# Patient Record
Sex: Female | Born: 1960 | Race: Black or African American | Hispanic: No | Marital: Single | Smoking: Never smoker
Health system: Southern US, Community
[De-identification: ages and names within clinical notes are randomized; demographics above are authoritative.]

## PROBLEM LIST (undated history)

## (undated) DIAGNOSIS — M1990 Unspecified osteoarthritis, unspecified site: Secondary | ICD-10-CM

## (undated) DIAGNOSIS — M62838 Other muscle spasm: Secondary | ICD-10-CM

## (undated) DIAGNOSIS — F329 Major depressive disorder, single episode, unspecified: Secondary | ICD-10-CM

## (undated) DIAGNOSIS — I10 Essential (primary) hypertension: Secondary | ICD-10-CM

## (undated) DIAGNOSIS — G4730 Sleep apnea, unspecified: Secondary | ICD-10-CM

## (undated) DIAGNOSIS — F32A Depression, unspecified: Secondary | ICD-10-CM

## (undated) DIAGNOSIS — R000 Tachycardia, unspecified: Secondary | ICD-10-CM

## (undated) HISTORY — PX: NO PAST SURGERIES: SHX2092

## (undated) HISTORY — DX: Tachycardia, unspecified: R00.0

## (undated) HISTORY — PX: TUBAL LIGATION: SHX77

---

## 1999-04-16 ENCOUNTER — Other Ambulatory Visit: Admission: RE | Admit: 1999-04-16 | Discharge: 1999-04-16 | Payer: Self-pay | Admitting: Internal Medicine

## 2001-09-08 ENCOUNTER — Emergency Department (HOSPITAL_COMMUNITY): Admission: EM | Admit: 2001-09-08 | Discharge: 2001-09-09 | Payer: Self-pay | Admitting: Emergency Medicine

## 2001-09-09 ENCOUNTER — Encounter: Payer: Self-pay | Admitting: Emergency Medicine

## 2002-04-25 ENCOUNTER — Other Ambulatory Visit: Admission: RE | Admit: 2002-04-25 | Discharge: 2002-04-25 | Payer: Self-pay | Admitting: Family Medicine

## 2002-09-22 ENCOUNTER — Encounter: Payer: Self-pay | Admitting: Family Medicine

## 2002-09-22 ENCOUNTER — Encounter: Admission: RE | Admit: 2002-09-22 | Discharge: 2002-09-22 | Payer: Self-pay | Admitting: Family Medicine

## 2003-02-26 ENCOUNTER — Emergency Department (HOSPITAL_COMMUNITY): Admission: EM | Admit: 2003-02-26 | Discharge: 2003-02-26 | Payer: Self-pay

## 2004-06-21 ENCOUNTER — Ambulatory Visit: Payer: Self-pay | Admitting: Family Medicine

## 2004-06-24 ENCOUNTER — Ambulatory Visit (HOSPITAL_COMMUNITY): Admission: RE | Admit: 2004-06-24 | Discharge: 2004-06-24 | Payer: Self-pay | Admitting: Family Medicine

## 2004-09-20 ENCOUNTER — Other Ambulatory Visit: Admission: RE | Admit: 2004-09-20 | Discharge: 2004-09-20 | Payer: Self-pay | Admitting: Family Medicine

## 2004-09-20 ENCOUNTER — Ambulatory Visit: Payer: Self-pay | Admitting: Family Medicine

## 2005-08-29 ENCOUNTER — Emergency Department (HOSPITAL_COMMUNITY): Admission: EM | Admit: 2005-08-29 | Discharge: 2005-08-29 | Payer: Self-pay | Admitting: Emergency Medicine

## 2006-02-27 ENCOUNTER — Ambulatory Visit: Payer: Self-pay | Admitting: Family Medicine

## 2006-03-17 ENCOUNTER — Emergency Department (HOSPITAL_COMMUNITY): Admission: EM | Admit: 2006-03-17 | Discharge: 2006-03-18 | Payer: Self-pay | Admitting: Emergency Medicine

## 2006-08-13 ENCOUNTER — Emergency Department (HOSPITAL_COMMUNITY): Admission: EM | Admit: 2006-08-13 | Discharge: 2006-08-13 | Payer: Self-pay | Admitting: Emergency Medicine

## 2007-04-03 ENCOUNTER — Emergency Department (HOSPITAL_COMMUNITY): Admission: EM | Admit: 2007-04-03 | Discharge: 2007-04-03 | Payer: Self-pay | Admitting: Emergency Medicine

## 2007-06-22 ENCOUNTER — Ambulatory Visit: Payer: Self-pay | Admitting: Internal Medicine

## 2007-06-22 ENCOUNTER — Encounter (INDEPENDENT_AMBULATORY_CARE_PROVIDER_SITE_OTHER): Payer: Self-pay | Admitting: Family Medicine

## 2007-06-22 LAB — CONVERTED CEMR LAB
AST: 14 units/L (ref 0–37)
BUN: 12 mg/dL (ref 6–23)
CO2: 22 meq/L (ref 19–32)
Calcium: 9.6 mg/dL (ref 8.4–10.5)
Chlamydia, DNA Probe: NEGATIVE
Cholesterol: 186 mg/dL (ref 0–200)
Creatinine, Ser: 0.69 mg/dL (ref 0.40–1.20)
GC Probe Amp, Genital: NEGATIVE
HCT: 36.9 % (ref 36.0–46.0)
Hemoglobin: 11.9 g/dL — ABNORMAL LOW (ref 12.0–15.0)
Lymphocytes Relative: 34 % (ref 12–46)
MCHC: 32.2 g/dL (ref 30.0–36.0)
Monocytes Absolute: 0.5 10*3/uL (ref 0.1–1.0)
Monocytes Relative: 6 % (ref 3–12)
Neutro Abs: 4.1 10*3/uL (ref 1.7–7.7)
Neutrophils Relative %: 58 % (ref 43–77)
Platelets: 348 10*3/uL (ref 150–400)
RBC: 4.17 M/uL (ref 3.87–5.11)
Sed Rate: 46 mm/hr — ABNORMAL HIGH (ref 0–22)
Sodium: 140 meq/L (ref 135–145)
Total Bilirubin: 0.5 mg/dL (ref 0.3–1.2)
Triglycerides: 79 mg/dL (ref <150)
VLDL: 16 mg/dL (ref 0–40)
WBC: 7.1 10*3/uL (ref 4.0–10.5)

## 2007-06-28 ENCOUNTER — Ambulatory Visit (HOSPITAL_COMMUNITY): Admission: RE | Admit: 2007-06-28 | Discharge: 2007-06-28 | Payer: Self-pay | Admitting: Family Medicine

## 2007-06-29 ENCOUNTER — Ambulatory Visit: Payer: Self-pay | Admitting: Internal Medicine

## 2008-11-07 ENCOUNTER — Ambulatory Visit: Payer: Self-pay | Admitting: Family Medicine

## 2008-11-07 LAB — CONVERTED CEMR LAB: Microalb, Ur: 0.5 mg/dL (ref 0.00–1.89)

## 2008-11-14 ENCOUNTER — Ambulatory Visit (HOSPITAL_COMMUNITY): Admission: RE | Admit: 2008-11-14 | Discharge: 2008-11-14 | Payer: Self-pay | Admitting: Family Medicine

## 2009-01-23 ENCOUNTER — Ambulatory Visit: Payer: Self-pay | Admitting: Internal Medicine

## 2009-01-23 ENCOUNTER — Encounter (INDEPENDENT_AMBULATORY_CARE_PROVIDER_SITE_OTHER): Payer: Self-pay | Admitting: Family Medicine

## 2009-01-23 LAB — CONVERTED CEMR LAB
ALT: 9 units/L (ref 0–35)
AST: 12 units/L (ref 0–37)
Albumin: 4.1 g/dL (ref 3.5–5.2)
Alkaline Phosphatase: 66 units/L (ref 39–117)
BUN: 10 mg/dL (ref 6–23)
CO2: 17 meq/L — ABNORMAL LOW (ref 19–32)
Chloride: 107 meq/L (ref 96–112)
Creatinine, Ser: 0.82 mg/dL (ref 0.40–1.20)
Glucose, Bld: 91 mg/dL (ref 70–99)
HCT: 37.5 % (ref 36.0–46.0)
Hemoglobin: 11.9 g/dL — ABNORMAL LOW (ref 12.0–15.0)
MCV: 88.9 fL (ref 78.0–100.0)
Platelets: 329 10*3/uL (ref 150–400)
Potassium: 3.9 meq/L (ref 3.5–5.3)
RBC: 4.22 M/uL (ref 3.87–5.11)
RDW: 14.6 % (ref 11.5–15.5)
TSH: 1.526 microintl units/mL (ref 0.350–4.500)
Total Bilirubin: 0.5 mg/dL (ref 0.3–1.2)
Total Protein: 7 g/dL (ref 6.0–8.3)
Vit D, 25-Hydroxy: 18 ng/mL — ABNORMAL LOW (ref 30–89)
WBC: 5 10*3/uL (ref 4.0–10.5)

## 2009-02-06 ENCOUNTER — Ambulatory Visit: Payer: Self-pay | Admitting: Internal Medicine

## 2009-02-26 ENCOUNTER — Telehealth (INDEPENDENT_AMBULATORY_CARE_PROVIDER_SITE_OTHER): Payer: Self-pay | Admitting: *Deleted

## 2009-02-27 ENCOUNTER — Ambulatory Visit: Payer: Self-pay | Admitting: Family Medicine

## 2009-02-27 ENCOUNTER — Emergency Department (HOSPITAL_COMMUNITY): Admission: EM | Admit: 2009-02-27 | Discharge: 2009-02-27 | Payer: Self-pay | Admitting: Emergency Medicine

## 2009-03-28 ENCOUNTER — Ambulatory Visit: Payer: Self-pay | Admitting: Family Medicine

## 2009-05-09 ENCOUNTER — Emergency Department (HOSPITAL_COMMUNITY): Admission: EM | Admit: 2009-05-09 | Discharge: 2009-05-09 | Payer: Self-pay | Admitting: Emergency Medicine

## 2009-05-31 ENCOUNTER — Ambulatory Visit: Payer: Self-pay | Admitting: Family Medicine

## 2009-06-28 ENCOUNTER — Emergency Department (HOSPITAL_COMMUNITY): Admission: EM | Admit: 2009-06-28 | Discharge: 2009-06-28 | Payer: Self-pay | Admitting: Emergency Medicine

## 2009-08-15 ENCOUNTER — Emergency Department (HOSPITAL_COMMUNITY): Admission: EM | Admit: 2009-08-15 | Discharge: 2009-08-15 | Payer: Self-pay | Admitting: Emergency Medicine

## 2009-08-21 ENCOUNTER — Ambulatory Visit: Payer: Self-pay | Admitting: Family Medicine

## 2009-09-12 ENCOUNTER — Encounter: Admission: RE | Admit: 2009-09-12 | Discharge: 2009-11-22 | Payer: Self-pay | Admitting: Family Medicine

## 2009-10-05 ENCOUNTER — Emergency Department (HOSPITAL_COMMUNITY): Admission: EM | Admit: 2009-10-05 | Discharge: 2009-10-05 | Payer: Self-pay | Admitting: Emergency Medicine

## 2009-10-10 ENCOUNTER — Ambulatory Visit: Payer: Self-pay | Admitting: Internal Medicine

## 2009-11-06 ENCOUNTER — Ambulatory Visit: Payer: Self-pay | Admitting: Family Medicine

## 2010-01-15 ENCOUNTER — Ambulatory Visit (HOSPITAL_COMMUNITY)
Admission: RE | Admit: 2010-01-15 | Discharge: 2010-01-15 | Payer: Self-pay | Source: Home / Self Care | Admitting: Family Medicine

## 2010-03-26 NOTE — Progress Notes (Signed)
Summary: triage/chest pain  Phone Note Call from Patient   Caller: Patient Reason for Call: Talk to Nurse Summary of Call: Patient states she has a cold with head and chest congestion with tightness in her chest and sharpe pain at the breast level...she says she has numbness in her hands. She feels she has had a fever because she wakes up in night sweats..She has a hx of HTN and denies sore throat or headache... Her mother died in her sleep at 37 and used to use nitroglycerin tabs...otherwise no known hx of heart problems. She has been taking OTC tyenlol and has a history of acid reflux and not taking any medications for that.Marland KitchenMarland KitchenI advised her to go to ED for evaluation since I cannot rule out a cardiac problem.. Initial call taken by: Conchita Paris,  February 26, 2009 4:22 PM

## 2010-05-03 ENCOUNTER — Other Ambulatory Visit (HOSPITAL_COMMUNITY): Payer: Self-pay | Admitting: Family Medicine

## 2010-05-03 ENCOUNTER — Ambulatory Visit (HOSPITAL_COMMUNITY)
Admission: RE | Admit: 2010-05-03 | Discharge: 2010-05-03 | Disposition: A | Discharge status: Home / Self Care | Payer: Medicaid Other | Source: Ambulatory Visit | Attending: Family Medicine | Admitting: Family Medicine

## 2010-05-03 DIAGNOSIS — R52 Pain, unspecified: Secondary | ICD-10-CM

## 2010-05-03 DIAGNOSIS — M545 Low back pain, unspecified: Secondary | ICD-10-CM | POA: Insufficient documentation

## 2010-05-06 ENCOUNTER — Emergency Department (HOSPITAL_COMMUNITY)
Admission: EM | Admit: 2010-05-06 | Discharge: 2010-05-07 | Disposition: A | Discharge status: Home / Self Care | Payer: Medicaid Other | Attending: Emergency Medicine | Admitting: Emergency Medicine

## 2010-05-06 DIAGNOSIS — X500XXA Overexertion from strenuous movement or load, initial encounter: Secondary | ICD-10-CM | POA: Insufficient documentation

## 2010-05-06 DIAGNOSIS — M1710 Unilateral primary osteoarthritis, unspecified knee: Secondary | ICD-10-CM | POA: Insufficient documentation

## 2010-05-06 DIAGNOSIS — S93409A Sprain of unspecified ligament of unspecified ankle, initial encounter: Secondary | ICD-10-CM | POA: Insufficient documentation

## 2010-05-06 DIAGNOSIS — IMO0002 Reserved for concepts with insufficient information to code with codable children: Secondary | ICD-10-CM | POA: Insufficient documentation

## 2010-05-06 DIAGNOSIS — E669 Obesity, unspecified: Secondary | ICD-10-CM | POA: Insufficient documentation

## 2010-05-06 DIAGNOSIS — M25579 Pain in unspecified ankle and joints of unspecified foot: Secondary | ICD-10-CM | POA: Insufficient documentation

## 2010-05-06 DIAGNOSIS — M25569 Pain in unspecified knee: Secondary | ICD-10-CM | POA: Insufficient documentation

## 2010-05-06 DIAGNOSIS — Y92009 Unspecified place in unspecified non-institutional (private) residence as the place of occurrence of the external cause: Secondary | ICD-10-CM | POA: Insufficient documentation

## 2010-05-06 DIAGNOSIS — I10 Essential (primary) hypertension: Secondary | ICD-10-CM | POA: Insufficient documentation

## 2010-05-07 ENCOUNTER — Emergency Department (HOSPITAL_COMMUNITY): Payer: Medicaid Other

## 2010-05-10 LAB — CBC
Hemoglobin: 12 g/dL (ref 12.0–15.0)
MCH: 30 pg (ref 26.0–34.0)
MCHC: 33.8 g/dL (ref 30.0–36.0)
RBC: 3.99 MIL/uL (ref 3.87–5.11)

## 2010-05-10 LAB — URINALYSIS, ROUTINE W REFLEX MICROSCOPIC
Glucose, UA: NEGATIVE mg/dL
Ketones, ur: NEGATIVE mg/dL
Nitrite: NEGATIVE
Protein, ur: NEGATIVE mg/dL
Specific Gravity, Urine: 1.008 (ref 1.005–1.030)
pH: 7 (ref 5.0–8.0)

## 2010-05-10 LAB — POCT CARDIAC MARKERS
CKMB, poc: <1 ng/mL — ABNORMAL LOW (ref 1.0–8.0)
Myoglobin, poc: 37.1 ng/mL (ref 12–200)

## 2010-05-10 LAB — COMPREHENSIVE METABOLIC PANEL
ALT: 17 U/L (ref 0–35)
AST: 17 U/L (ref 0–37)
CO2: 24 mEq/L (ref 19–32)
Chloride: 111 mEq/L (ref 96–112)
GFR calc non Af Amer: >60 mL/min (ref >60)
Potassium: 4.1 mEq/L (ref 3.5–5.1)
Sodium: 141 mEq/L (ref 135–145)
Total Protein: 7.4 g/dL (ref 6.0–8.3)

## 2010-05-10 LAB — DIFFERENTIAL
Basophils Absolute: 0 10*3/uL (ref 0.0–0.1)
Eosinophils Relative: 1 % (ref 0–5)
Monocytes Absolute: 0.2 10*3/uL (ref 0.1–1.0)
Monocytes Relative: 3 % (ref 3–12)
Neutrophils Relative %: 67 % (ref 43–77)

## 2010-05-10 LAB — D-DIMER, QUANTITATIVE: D-Dimer, Quant: 0.35 ug/mL-FEU (ref 0.00–0.48)

## 2010-05-12 LAB — BASIC METABOLIC PANEL WITH GFR
BUN: 9 mg/dL (ref 6–23)
CO2: 26 meq/L (ref 19–32)
Calcium: 9.4 mg/dL (ref 8.4–10.5)
Chloride: 108 meq/L (ref 96–112)
Creatinine, Ser: 0.81 mg/dL (ref 0.4–1.2)
GFR calc non Af Amer: >60 mL/min
Glucose, Bld: 97 mg/dL (ref 70–99)
Potassium: 4.5 meq/L (ref 3.5–5.1)
Sodium: 139 meq/L (ref 135–145)

## 2010-05-12 LAB — CK TOTAL AND CKMB (NOT AT ARMC)
CK, MB: 1.5 ng/mL (ref 0.3–4.0)
Relative Index: 1.3 (ref 0.0–2.5)
Total CK: 114 U/L (ref 7–177)

## 2010-05-12 LAB — CBC
Hemoglobin: 13.2 g/dL (ref 12.0–15.0)
RBC: 4.47 MIL/uL (ref 3.87–5.11)
RDW: 14.7 % (ref 11.5–15.5)

## 2010-05-12 LAB — DIFFERENTIAL
Basophils Absolute: 0 10*3/uL (ref 0.0–0.1)
Basophils Relative: 1 % (ref 0–1)
Eosinophils Relative: 1 % (ref 0–5)
Lymphocytes Relative: 31 % (ref 12–46)
Monocytes Absolute: 0.4 10*3/uL (ref 0.1–1.0)

## 2010-05-12 LAB — TROPONIN I

## 2010-06-18 ENCOUNTER — Other Ambulatory Visit: Payer: Self-pay | Admitting: Family Medicine

## 2010-11-14 ENCOUNTER — Emergency Department (HOSPITAL_COMMUNITY)
Admission: EM | Admit: 2010-11-14 | Discharge: 2010-11-15 | Disposition: A | Discharge status: Home / Self Care | Payer: Medicaid Other | Attending: Emergency Medicine | Admitting: Emergency Medicine

## 2010-11-14 ENCOUNTER — Emergency Department (HOSPITAL_COMMUNITY): Payer: Medicaid Other

## 2010-11-14 DIAGNOSIS — F329 Major depressive disorder, single episode, unspecified: Secondary | ICD-10-CM | POA: Insufficient documentation

## 2010-11-14 DIAGNOSIS — F3289 Other specified depressive episodes: Secondary | ICD-10-CM | POA: Insufficient documentation

## 2010-11-14 DIAGNOSIS — N390 Urinary tract infection, site not specified: Secondary | ICD-10-CM | POA: Insufficient documentation

## 2010-11-14 DIAGNOSIS — I10 Essential (primary) hypertension: Secondary | ICD-10-CM | POA: Insufficient documentation

## 2010-11-14 DIAGNOSIS — Z79899 Other long term (current) drug therapy: Secondary | ICD-10-CM | POA: Insufficient documentation

## 2010-11-14 DIAGNOSIS — R109 Unspecified abdominal pain: Secondary | ICD-10-CM | POA: Insufficient documentation

## 2010-11-14 LAB — POCT I-STAT, CHEM 8
Calcium, Ion: 1.2 mmol/L (ref 1.12–1.32)
Chloride: 103 mEq/L (ref 96–112)
Glucose, Bld: 113 mg/dL — ABNORMAL HIGH (ref 70–99)
HCT: 41 % (ref 36.0–46.0)
Hemoglobin: 13.9 g/dL (ref 12.0–15.0)
TCO2: 29 mmol/L (ref 0–100)

## 2010-11-15 LAB — URINALYSIS, ROUTINE W REFLEX MICROSCOPIC
Hgb urine dipstick: NEGATIVE
Ketones, ur: 15 mg/dL — AB
Nitrite: NEGATIVE
Protein, ur: NEGATIVE mg/dL
Urobilinogen, UA: 1 mg/dL (ref 0.0–1.0)

## 2010-11-15 LAB — URINE MICROSCOPIC-ADD ON

## 2010-11-15 LAB — POCT PREGNANCY, URINE: Preg Test, Ur: NEGATIVE

## 2010-12-11 LAB — URINALYSIS, ROUTINE W REFLEX MICROSCOPIC
Glucose, UA: NEGATIVE
Hgb urine dipstick: NEGATIVE
Protein, ur: NEGATIVE
Specific Gravity, Urine: 1.028

## 2010-12-11 LAB — URINE MICROSCOPIC-ADD ON

## 2011-01-20 ENCOUNTER — Other Ambulatory Visit (HOSPITAL_COMMUNITY): Payer: Self-pay | Admitting: Family Medicine

## 2011-01-20 DIAGNOSIS — Z1231 Encounter for screening mammogram for malignant neoplasm of breast: Secondary | ICD-10-CM

## 2011-01-23 ENCOUNTER — Emergency Department (HOSPITAL_COMMUNITY)
Admission: EM | Admit: 2011-01-23 | Discharge: 2011-01-23 | Disposition: A | Discharge status: Home / Self Care | Payer: Medicaid Other | Attending: Emergency Medicine | Admitting: Emergency Medicine

## 2011-01-23 ENCOUNTER — Encounter: Payer: Self-pay | Admitting: Emergency Medicine

## 2011-01-23 DIAGNOSIS — M79606 Pain in leg, unspecified: Secondary | ICD-10-CM

## 2011-01-23 DIAGNOSIS — M79609 Pain in unspecified limb: Secondary | ICD-10-CM | POA: Insufficient documentation

## 2011-01-23 DIAGNOSIS — I10 Essential (primary) hypertension: Secondary | ICD-10-CM | POA: Insufficient documentation

## 2011-01-23 HISTORY — DX: Essential (primary) hypertension: I10

## 2011-01-23 HISTORY — DX: Unspecified osteoarthritis, unspecified site: M19.90

## 2011-01-23 MED ORDER — OXYCODONE-ACETAMINOPHEN 5-325 MG PO TABS
2.0000 | ORAL_TABLET | Freq: Once | ORAL | Status: AC
  Administered 2011-01-23: 2 via ORAL
  Filled 2011-01-23: qty 2

## 2011-01-23 MED ORDER — OXYCODONE-ACETAMINOPHEN 5-325 MG PO TABS: 2.0000 | ORAL_TABLET | ORAL | Status: AC | PRN

## 2011-01-23 MED ORDER — ENOXAPARIN SODIUM 150 MG/ML ~~LOC~~ SOLN
190.0000 mg | Freq: Once | SUBCUTANEOUS | Status: AC
  Administered 2011-01-23: 195 mg via SUBCUTANEOUS
  Filled 2011-01-23: qty 2

## 2011-01-23 NOTE — ED Notes (Signed)
Pt denies recent extended road trips & air travel

## 2011-01-23 NOTE — ED Provider Notes (Signed)
Medical screening examination/treatment/procedure(s) were performed by non-physician practitioner and as supervising physician I was immediately available for consultation/collaboration.  Martha K Linker, MD 01/23/11 2347 

## 2011-01-23 NOTE — ED Provider Notes (Signed)
History     CSN: [id number] Arrival date & time: 01/23/2011  6:39 PM   First MD Initiated Contact with Patient 01/23/11 2135      Chief Complaint  Patient presents with  . Leg Pain    (Consider location/radiation/quality/duration/timing/severity/associated sxs/prior treatment) Patient is a 50 y.o. female presenting with leg pain. The history is provided by the patient.  Leg Pain  The incident occurred yesterday. There was no injury mechanism. The pain is present in the right thigh and right leg. The quality of the pain is described as aching. Associated symptoms comments: Swelling. No shortness of breath or chest pain..    Past Medical History  Diagnosis Date  . Hypertension   . Arthritis     History reviewed. No pertinent past surgical history.  No family history on file.  History  Substance Use Topics  . Smoking status: Never Smoker   . Smokeless tobacco: Not on file  . Alcohol Use: No    OB History    Grav Para Term Preterm Abortions TAB SAB Ect Mult Living                  Review of Systems  Constitutional: Negative for fever and chills.  HENT: Negative.   Respiratory: Negative.   Cardiovascular: Negative.   Gastrointestinal: Negative.   Musculoskeletal:       See HPI.  Skin: Negative.   Neurological: Negative.     Allergies  Review of patient's allergies indicates no known allergies.  Home Medications   Current Outpatient Rx  Name Route Sig Dispense Refill  . SEROQUEL PO Oral Take by mouth.        BP 114/78  Pulse 85  Temp(Src) 97.5 F (36.4 C) (Oral)  Resp 20  Wt 425 lb (192.779 kg)  SpO2 98%  LMP 01/12/2011  Physical Exam  Constitutional: She appears well-developed and well-nourished.  HENT:  Head: Normocephalic.  Neck: Normal range of motion. Neck supple.  Cardiovascular: Normal rate and regular rhythm.   Pulmonary/Chest: Effort normal and breath sounds normal.  Abdominal: Soft. Bowel sounds are normal. There is no tenderness.  There is no rebound and no guarding.  Musculoskeletal: Normal range of motion.       Patient's exam limited due to body habitus. No swelling noted to right lower extremity, however, patient is morbidly obese. No erythema or warmth to touch. No lesions or sore. Tender to inner right thigh that extends to proximal calf. Distal pulses are 2+.  Neurological: She is alert. No cranial nerve deficit.  Skin: Skin is warm and dry. No rash noted.  Psychiatric: She has a normal mood and affect.    ED Course  Procedures (including critical care time)  Labs Reviewed - No data to display No results found.   No diagnosis found.    MDM  Patient given Lovenox and doppler studies ordered for the morning.        Rodena Medin, PA 01/23/11 2336

## 2011-01-23 NOTE — ED Notes (Signed)
PT. REPORTS RIGHT LEG PAIN WITH SWELLING FOR1 WEEK , DENIES INJURY OR FALL - SEEN [facility] ADVISED TO GO TO ER TO RULE OUT DVT.  NO SOB .

## 2011-01-24 ENCOUNTER — Ambulatory Visit (HOSPITAL_COMMUNITY)
Admission: RE | Admit: 2011-01-24 | Discharge: 2011-01-24 | Disposition: A | Discharge status: Home / Self Care | Payer: Medicaid Other | Source: Ambulatory Visit | Attending: Emergency Medicine | Admitting: Emergency Medicine

## 2011-01-24 DIAGNOSIS — M7989 Other specified soft tissue disorders: Secondary | ICD-10-CM

## 2011-01-24 DIAGNOSIS — R609 Edema, unspecified: Secondary | ICD-10-CM

## 2011-01-24 DIAGNOSIS — R52 Pain, unspecified: Secondary | ICD-10-CM

## 2011-01-24 DIAGNOSIS — M79609 Pain in unspecified limb: Secondary | ICD-10-CM | POA: Insufficient documentation

## 2011-01-24 NOTE — Progress Notes (Signed)
*  PRELIMINARY RESULTS*  Right Lower Extremity Venous has been performed.  No obvious evidence of deep vein thrombosis involving the right lower extremity. No evidence of Baker's cyst on the right.  Carla Sloan 01/24/2011, 12:32 PM

## 2011-02-24 ENCOUNTER — Ambulatory Visit (HOSPITAL_COMMUNITY)
Admission: RE | Admit: 2011-02-24 | Discharge: 2011-02-24 | Disposition: A | Discharge status: Home / Self Care | Payer: Medicaid Other | Source: Ambulatory Visit | Attending: Family Medicine | Admitting: Family Medicine

## 2011-02-24 DIAGNOSIS — Z1231 Encounter for screening mammogram for malignant neoplasm of breast: Secondary | ICD-10-CM | POA: Insufficient documentation

## 2011-05-29 ENCOUNTER — Ambulatory Visit: Payer: Medicaid Other | Attending: Family Medicine

## 2011-05-29 DIAGNOSIS — M2550 Pain in unspecified joint: Secondary | ICD-10-CM | POA: Insufficient documentation

## 2011-05-29 DIAGNOSIS — M545 Low back pain, unspecified: Secondary | ICD-10-CM | POA: Insufficient documentation

## 2011-05-29 DIAGNOSIS — IMO0001 Reserved for inherently not codable concepts without codable children: Secondary | ICD-10-CM | POA: Insufficient documentation

## 2011-05-29 DIAGNOSIS — R5381 Other malaise: Secondary | ICD-10-CM | POA: Insufficient documentation

## 2011-05-29 DIAGNOSIS — M2560 Stiffness of unspecified joint, not elsewhere classified: Secondary | ICD-10-CM | POA: Insufficient documentation

## 2011-06-02 ENCOUNTER — Encounter: Payer: Self-pay | Admitting: *Deleted

## 2011-06-03 ENCOUNTER — Ambulatory Visit: Payer: Medicaid Other

## 2011-06-10 ENCOUNTER — Ambulatory Visit: Payer: Medicaid Other

## 2011-06-17 ENCOUNTER — Ambulatory Visit: Payer: Medicaid Other

## 2011-07-03 ENCOUNTER — Emergency Department (HOSPITAL_COMMUNITY): Payer: Medicaid Other

## 2011-07-03 ENCOUNTER — Emergency Department (HOSPITAL_COMMUNITY)
Admission: EM | Admit: 2011-07-03 | Discharge: 2011-07-03 | Disposition: A | Discharge status: Home / Self Care | Payer: Medicaid Other | Attending: Emergency Medicine | Admitting: Emergency Medicine

## 2011-07-03 ENCOUNTER — Encounter (HOSPITAL_COMMUNITY): Payer: Self-pay

## 2011-07-03 DIAGNOSIS — R059 Cough, unspecified: Secondary | ICD-10-CM | POA: Insufficient documentation

## 2011-07-03 DIAGNOSIS — E669 Obesity, unspecified: Secondary | ICD-10-CM

## 2011-07-03 DIAGNOSIS — R0602 Shortness of breath: Secondary | ICD-10-CM | POA: Insufficient documentation

## 2011-07-03 DIAGNOSIS — M129 Arthropathy, unspecified: Secondary | ICD-10-CM | POA: Insufficient documentation

## 2011-07-03 DIAGNOSIS — Z79899 Other long term (current) drug therapy: Secondary | ICD-10-CM | POA: Insufficient documentation

## 2011-07-03 DIAGNOSIS — I10 Essential (primary) hypertension: Secondary | ICD-10-CM | POA: Insufficient documentation

## 2011-07-03 DIAGNOSIS — R609 Edema, unspecified: Secondary | ICD-10-CM | POA: Insufficient documentation

## 2011-07-03 DIAGNOSIS — R05 Cough: Secondary | ICD-10-CM | POA: Insufficient documentation

## 2011-07-03 DIAGNOSIS — M7989 Other specified soft tissue disorders: Secondary | ICD-10-CM | POA: Insufficient documentation

## 2011-07-03 LAB — PRO B NATRIURETIC PEPTIDE: Pro B Natriuretic peptide (BNP): 77.8 pg/mL (ref 0–125)

## 2011-07-03 LAB — CBC
HCT: 41.6 % (ref 36.0–46.0)
MCH: 29.4 pg (ref 26.0–34.0)
MCV: 87.2 fL (ref 78.0–100.0)
Platelets: 319 10*3/uL (ref 150–400)
RDW: 14.3 % (ref 11.5–15.5)
WBC: 6 10*3/uL (ref 4.0–10.5)

## 2011-07-03 LAB — DIFFERENTIAL
Basophils Absolute: 0 10*3/uL (ref 0.0–0.1)
Eosinophils Absolute: 0.1 10*3/uL (ref 0.0–0.7)
Eosinophils Relative: 2 % (ref 0–5)
Lymphocytes Relative: 40 % (ref 12–46)
Monocytes Absolute: 0.4 10*3/uL (ref 0.1–1.0)

## 2011-07-03 LAB — BASIC METABOLIC PANEL
CO2: 25 mEq/L (ref 19–32)
Calcium: 9.8 mg/dL (ref 8.4–10.5)
Creatinine, Ser: 0.89 mg/dL (ref 0.50–1.10)
GFR calc non Af Amer: 75 mL/min — ABNORMAL LOW (ref >90)
Glucose, Bld: 122 mg/dL — ABNORMAL HIGH (ref 70–99)

## 2011-07-03 NOTE — ED Notes (Signed)
Pt ambulated into ED independently without difficulty.

## 2011-07-03 NOTE — ED Provider Notes (Signed)
History     CSN: [id number]  Arrival date & time 07/03/11  1002   First MD Initiated Contact with Patient 07/03/11 1014      Chief Complaint  Patient presents with  . Shortness of Breath    (Consider location/radiation/quality/duration/timing/severity/associated sxs/prior treatment) HPI Comments: Patient reports that she has been feeling short of breath over the past 2 days, which is becoming progressively worse.  She reports that the shortness of breath becomes worse when she is lying flat.  She also reports PND over the past couple of days.  She reports that she has not noticed any lower extremity edema.  She reports that last evening she had a dry cough.  Denies wheezing.  Denies fever or chills.  Denies chest pain.  No history of COPD or asthma.  No known history of CHF.  No known history of cardiac disease.  She does have a history of HTN.  No history of hyperlipidemia or DM.  She does not smoke and never has smoked.  She denies any prolonged travel or surgery in the past 4 weeks, denies use of any estrogen containing medications, no history of DVT/PE.  Denies any erythema, pain, or swelling of lower extremities.  Her PCP is [facility].  Patient is a 51 y.o. female presenting with shortness of breath. The history is provided by the patient.  Shortness of Breath  Associated symptoms include cough and shortness of breath. Pertinent negatives include no chest pain, no fever, no rhinorrhea and no wheezing.    Past Medical History  Diagnosis Date  . Hypertension   . Arthritis     History reviewed. No pertinent past surgical history.  No family history on file.  History  Substance Use Topics  . Smoking status: Never Smoker   . Smokeless tobacco: Not on file  . Alcohol Use: No    OB History    Grav Para Term Preterm Abortions TAB SAB Ect Mult Living                  Review of Systems  Constitutional: Negative for fever, chills and diaphoresis.  HENT: Negative for  congestion and rhinorrhea.   Respiratory: Positive for cough and shortness of breath. Negative for chest tightness and wheezing.   Cardiovascular: Positive for leg swelling. Negative for chest pain.  Gastrointestinal: Negative for nausea and vomiting.  Neurological: Negative for dizziness, syncope and light-headedness.    Allergies  Review of patient's allergies indicates no known allergies.  Home Medications   Current Outpatient Rx  Name Route Sig Dispense Refill  . CITALOPRAM HYDROBROMIDE 40 MG PO TABS Oral Take 40 mg by mouth at bedtime.    . CYCLOBENZAPRINE HCL 5 MG PO TABS Oral Take 5 mg by mouth 3 (three) times daily.    Marland Kitchen GABAPENTIN 400 MG PO CAPS Oral Take 400-800 mg by mouth 2 (two) times daily. 1 cap in the morning and 2 cap at night    . HYDROCHLOROTHIAZIDE 25 MG PO TABS Oral Take 25 mg by mouth daily.    . QUETIAPINE FUMARATE 200 MG PO TABS Oral Take 200 mg by mouth at bedtime.    Marland Kitchen VERAPAMIL HCL ER 240 MG PO TBCR Oral Take 240 mg by mouth daily.      BP 122/76  Pulse 97  Temp 98.7 F (37.1 C)  Resp 16  SpO2 96%  LMP 06/09/2011  Physical Exam  Nursing note and vitals reviewed. Constitutional: She appears well-developed and well-nourished. No distress.  Morbidly obese  HENT:  Head: Normocephalic and atraumatic.  Neck: Normal range of motion. Neck supple.  Cardiovascular: Normal rate, regular rhythm, normal heart sounds and intact distal pulses.        1+ pitting pretibial edema bilaterally   Pulmonary/Chest: Effort normal and breath sounds normal. No accessory muscle usage. Not tachypneic. No respiratory distress. She has no decreased breath sounds. She has no wheezes. She has no rhonchi. She has no rales.       Patient increasingly short of breath while lying down  Neurological: She is alert.  Skin: Skin is warm and dry. She is not diaphoretic. No erythema.  Psychiatric: She has a normal mood and affect.    ED Course  Procedures (including critical care  time)   Labs Reviewed  CBC  DIFFERENTIAL  BASIC METABOLIC PANEL  PRO B NATRIURETIC PEPTIDE   No results found.   No diagnosis found.   Date: 07/03/2011  Rate: 90  Rhythm: normal sinus rhythm  QRS Axis: normal  Intervals: normal  ST/T Wave abnormalities: nonspecific T wave changes  Conduction Disutrbances:none  Narrative Interpretation:   Old EKG Reviewed: unchanged   Patient discussed with Dr. Anitra Lauth who also evaluated patient.   MDM  Patient comes in today with a chief complaint of SOB.  She denies any chest pain.  Lungs CTAB.  No respiratory distress.  Pulse ox 98 on RA.  No acute changes on CXR.  Negative troponin.  No acute changes on EKG.  Normal d-dimer.  Normal BNP.  Feel that shortness of breath is most likely caused by morbid obesity.  Patient instructed to follow up with PCP.        Pascal Lux [place], PA-C 07/03/11 1737

## 2011-07-03 NOTE — Discharge Instructions (Signed)
Wear compression hose on both legs and elevate your legs to help with the swelling.

## 2011-07-03 NOTE — ED Notes (Signed)
Pt. Having sob with a dry cough began yesterday, pt. Denies any chest pain, is unable to lay flat

## 2011-07-03 NOTE — ED Notes (Signed)
Pt states sob x 2 days cannot lay flat to sleep she states  Sob on exertion is on 2 meds for htn back pain pt is obese

## 2011-07-03 NOTE — ED Provider Notes (Signed)
Medical screening examination/treatment/procedure(s) were conducted as a shared visit with non-physician practitioner(s) and myself.  I personally evaluated the patient during the encounter Pt with SOB with lying down and trace fluid in the lower ext.  Pt is in no distress on exam and sating normally.  All labs for possible PE, CHF, cardiac issues are wnl.  Pt denies CP.  Feel the majority of her symptoms are from her weight problems.  Pt is over 400lbs and 5'5''.  This was discussed the pt that is most likely the origin of her problems and encouraged her to find weight loss options and to talk with her PMD.  Gwyneth Sprout, MD 07/03/11 2139

## 2012-06-07 ENCOUNTER — Other Ambulatory Visit (HOSPITAL_COMMUNITY): Payer: Self-pay | Admitting: Internal Medicine

## 2012-06-07 DIAGNOSIS — Z1231 Encounter for screening mammogram for malignant neoplasm of breast: Secondary | ICD-10-CM

## 2012-06-29 ENCOUNTER — Ambulatory Visit (HOSPITAL_COMMUNITY): Payer: Medicaid Other | Attending: Internal Medicine

## 2012-08-24 ENCOUNTER — Other Ambulatory Visit: Payer: Self-pay

## 2012-08-24 DIAGNOSIS — Z1231 Encounter for screening mammogram for malignant neoplasm of breast: Secondary | ICD-10-CM

## 2012-08-30 ENCOUNTER — Encounter (HOSPITAL_COMMUNITY): Payer: Self-pay | Admitting: *Deleted

## 2012-09-04 ENCOUNTER — Emergency Department (HOSPITAL_COMMUNITY)
Admission: EM | Admit: 2012-09-04 | Discharge: 2012-09-04 | Disposition: A | Discharge status: Home / Self Care | Payer: Medicare Other | Attending: Emergency Medicine | Admitting: Emergency Medicine

## 2012-09-04 ENCOUNTER — Encounter (HOSPITAL_COMMUNITY): Payer: Self-pay | Admitting: Emergency Medicine

## 2012-09-04 DIAGNOSIS — L259 Unspecified contact dermatitis, unspecified cause: Secondary | ICD-10-CM | POA: Insufficient documentation

## 2012-09-04 DIAGNOSIS — Z8739 Personal history of other diseases of the musculoskeletal system and connective tissue: Secondary | ICD-10-CM | POA: Insufficient documentation

## 2012-09-04 DIAGNOSIS — Z79899 Other long term (current) drug therapy: Secondary | ICD-10-CM | POA: Insufficient documentation

## 2012-09-04 DIAGNOSIS — I10 Essential (primary) hypertension: Secondary | ICD-10-CM | POA: Insufficient documentation

## 2012-09-04 MED ORDER — DIPHENHYDRAMINE HCL 25 MG PO TABS: 25.0000 mg | ORAL_TABLET | Freq: Four times a day (QID) | ORAL | Status: DC

## 2012-09-04 MED ORDER — FAMOTIDINE 20 MG PO TABS: 20.0000 mg | ORAL_TABLET | Freq: Two times a day (BID) | ORAL | Status: DC

## 2012-09-04 MED ORDER — PREDNISONE (PAK) 10 MG PO TABS: 10.0000 mg | ORAL_TABLET | Freq: Every day | ORAL | Status: DC

## 2012-09-04 NOTE — ED Notes (Signed)
Patient with rash on arms, chest, for two weeks.  No other associated symptoms.  Patient reports the rash itches.

## 2012-09-04 NOTE — ED Provider Notes (Signed)
History    CSN: [id number] Arrival date & time 09/04/12  1254  First MD Initiated Contact with Patient 09/04/12 1258     Chief Complaint  Patient presents with  . Rash   (Consider location/radiation/quality/duration/timing/severity/associated sxs/prior Treatment) HPI Comments: Patient reports itchy rash over arms, upper chest and upper back x 2 weeks.  She has changed detergents, has bought new clothes and worn them without washing them, and has a new mattress.  She has also stayed on her sister's couch recently (before the rash began).  Denies fevers, chills, difficulty swallowing or breathing, itching or swelling in her mouth or throat, any painful lesions.   The history is provided by the patient and the spouse.   Past Medical History  Diagnosis Date  . Hypertension   . Arthritis    Past Surgical History  Procedure Laterality Date  . No past surgeries     No family history on file. History  Substance Use Topics  . Smoking status: Never Smoker   . Smokeless tobacco: Never Used  . Alcohol Use: No   OB History   Grav Para Term Preterm Abortions TAB SAB Ect Mult Living                 Review of Systems  Constitutional: Negative for fever and chills.  Skin: Positive for rash.  Neurological: Negative for weakness and numbness.    Allergies  Review of patient's allergies indicates no known allergies.  Home Medications   Current Outpatient Rx  Name  Route  Sig  Dispense  Refill  . citalopram (CELEXA) 40 MG tablet   Oral   Take 40 mg by mouth at bedtime.         . gabapentin (NEURONTIN) 400 MG capsule   Oral   Take 400-800 mg by mouth 2 (two) times daily. 1 cap in the morning and 2 cap at night         . hydrochlorothiazide (HYDRODIURIL) 25 MG tablet   Oral   Take 25 mg by mouth daily.         . QUEtiapine (SEROQUEL) 200 MG tablet   Oral   Take 200 mg by mouth at bedtime.         . verapamil (CALAN-SR) 240 MG CR tablet   Oral   Take 240 mg by  mouth daily.          BP 150/86  Pulse 90  Temp(Src) 98.1 F (36.7 C) (Oral)  Resp 20  Wt 370 lb (167.831 kg)  SpO2 95%  LMP 07/31/2012 Physical Exam  Nursing note and vitals reviewed. Constitutional: She appears well-developed and well-nourished. No distress.  HENT:  Head: Normocephalic and atraumatic.  Mouth/Throat: Oropharynx is clear and moist. No oropharyngeal exudate.  Neck: Neck supple.  Cardiovascular: Normal rate and regular rhythm.   Pulmonary/Chest: Effort normal and breath sounds normal. No stridor. No respiratory distress. She has no wheezes. She has no rales.  Neurological: She is alert.  Skin: Rash noted. She is not diaphoretic.  Papular rash over upper extremities and small amount over chest and upper back.  Spares hands.  No burrows noted.    ED Course  Procedures (including critical care time) Labs Reviewed - No data to display No results found.   1. Contact dermatitis     MDM  Pt with pruritic rash with multiple possible sources.  Lesions appear most like insect bites, though doubt scabies.  Discussed all possibilities and recommendations with patient.  No one else appears to have rash per pt.  No airway concerns. Discussed findings, treatment, and follow up with patient.  Pt given return precautions.  Pt verbalizes understanding and agrees with plan.     Trixie Dredge, PA-C 09/04/12 1532

## 2012-09-05 NOTE — ED Provider Notes (Signed)
Medical screening examination/treatment/procedure(s) were performed by non-physician practitioner and as supervising physician I was immediately available for consultation/collaboration.  Boluwatife Flight, MD 09/05/12 0801 

## 2012-09-07 ENCOUNTER — Encounter (HOSPITAL_COMMUNITY): Payer: Self-pay | Admitting: Pharmacy Technician

## 2012-09-13 ENCOUNTER — Ambulatory Visit: Payer: Medicaid Other

## 2012-09-16 ENCOUNTER — Encounter (HOSPITAL_COMMUNITY): Payer: Self-pay | Admitting: Emergency Medicine

## 2012-09-16 ENCOUNTER — Emergency Department (HOSPITAL_COMMUNITY)
Admission: EM | Admit: 2012-09-16 | Discharge: 2012-09-16 | Disposition: A | Discharge status: Home / Self Care | Payer: Medicare Other | Attending: Emergency Medicine | Admitting: Emergency Medicine

## 2012-09-16 DIAGNOSIS — R21 Rash and other nonspecific skin eruption: Secondary | ICD-10-CM | POA: Insufficient documentation

## 2012-09-16 DIAGNOSIS — Z79899 Other long term (current) drug therapy: Secondary | ICD-10-CM | POA: Insufficient documentation

## 2012-09-16 DIAGNOSIS — Z8739 Personal history of other diseases of the musculoskeletal system and connective tissue: Secondary | ICD-10-CM | POA: Insufficient documentation

## 2012-09-16 DIAGNOSIS — I10 Essential (primary) hypertension: Secondary | ICD-10-CM | POA: Insufficient documentation

## 2012-09-16 DIAGNOSIS — L299 Pruritus, unspecified: Secondary | ICD-10-CM | POA: Insufficient documentation

## 2012-09-16 DIAGNOSIS — IMO0002 Reserved for concepts with insufficient information to code with codable children: Secondary | ICD-10-CM | POA: Insufficient documentation

## 2012-09-16 MED ORDER — PREDNISONE 20 MG PO TABS: 60.0000 mg | ORAL_TABLET | Freq: Every day | ORAL | Status: DC

## 2012-09-16 MED ORDER — HYDROXYZINE HCL 25 MG PO TABS: 25.0000 mg | ORAL_TABLET | Freq: Four times a day (QID) | ORAL | Status: DC

## 2012-09-16 NOTE — ED Provider Notes (Signed)
CSN: [id number]     Arrival date & time 09/16/12  1616 History    This chart was scribed for non-physician practitioner Arnoldo Hooker PA-C working with Flint Melter, MD by Donne Anon, ED Scribe. This patient was seen in room WTR6/WTR6 and the patient's care was started at 1704.   First MD Initiated Contact with Patient 09/16/12 1704     No chief complaint on file.   The history is provided by the patient. No language interpreter was used.   HPI Comments: Carla BARCELO is a 52 y.o. female who presents to the Emergency Department complaining of 1 week of gradual onset, gradually worsening, constant red raised, itching rash on her arms and back. She denies prior similar episodes. She denies recent yard work, insect bites, new soaps, new lotions, new medication or any other new exposures or changes to her daily routine. She denies SOB, lip swelling, throat swelling or any other pain or injury. Pt denies taking OTC medications at home to improve symptoms.    Past Medical History  Diagnosis Date  . Hypertension   . Arthritis    Past Surgical History  Procedure Laterality Date  . No past surgeries     No family history on file. History  Substance Use Topics  . Smoking status: Never Smoker   . Smokeless tobacco: Never Used  . Alcohol Use: No   OB History   Grav Para Term Preterm Abortions TAB SAB Ect Mult Living                 Review of Systems  HENT: Negative for facial swelling and trouble swallowing.   Respiratory: Negative for shortness of breath.   Skin: Positive for rash.  All other systems reviewed and are negative.    Allergies  Review of patient's allergies indicates no known allergies.  Home Medications   Current Outpatient Rx  Name  Route  Sig  Dispense  Refill  . citalopram (CELEXA) 40 MG tablet   Oral   Take 40 mg by mouth at bedtime.         . diphenhydrAMINE (BENADRYL) 25 MG tablet   Oral   Take 25 mg by mouth every 6 (six) hours as  needed for itching or allergies.         . famotidine (PEPCID) 20 MG tablet   Oral   Take 20 mg by mouth 2 (two) times daily.         Marland Kitchen gabapentin (NEURONTIN) 400 MG capsule   Oral   Take 400-800 mg by mouth 2 (two) times daily. 1 cap in the morning and 2 cap at night         . hydrochlorothiazide (HYDRODIURIL) 25 MG tablet   Oral   Take 25 mg by mouth every morning.          . predniSONE (STERAPRED UNI-PAK) 10 MG tablet   Oral   Take 1 tablet (10 mg total) by mouth daily. Day 1: take 6 tabs.  Day 2: 5 tabs  Day 3: 4 tabs  Day 4: 3 tabs  Day 5: 2 tabs  Day 6: 1 tab   21 tablet   0   . QUEtiapine (SEROQUEL) 200 MG tablet   Oral   Take 200 mg by mouth at bedtime.         . verapamil (CALAN-SR) 240 MG CR tablet   Oral   Take 240 mg by mouth every morning.  BP 125/77  Pulse 81  Temp(Src) 98.7 F (37.1 C) (Oral)  Resp 22  SpO2 94%  LMP 07/31/2012  Physical Exam  Nursing note and vitals reviewed. Constitutional: She appears well-developed and well-nourished. No distress.  HENT:  Head: Normocephalic and atraumatic.  Eyes: Conjunctivae are normal.  Neck: Neck supple. No tracheal deviation present.  Cardiovascular: Normal rate.   Pulmonary/Chest: Effort normal. No respiratory distress.  Musculoskeletal: Normal range of motion.  Neurological: She is alert.  Skin: Skin is warm and dry. Rash noted.  Maculopapular rash that affects forearms bilaterally, hands (spares interphalangeal spaces) and upper back.   Psychiatric: She has a normal mood and affect. Her behavior is normal.    ED Course   Procedures (including critical care time) DIAGNOSTIC STUDIES: Oxygen Saturation is 94% on RA, adeuate by my interpretation.    COORDINATION OF CARE: 5:33 PM Discussed treatment plan which includes consult with Tonny Bollman, PA-C with pt at bedside and pt agreed to plan.   5:39 PM Tonny Bollman, PA-C, evaluated pt and recommended Prednisone and a cream. Pt  aggress to plan. Advised pt to follow up with PCP. Return precautions advised.    Labs Reviewed - No data to display No results found. No diagnosis found. 1. Nonspecific rash MDM  Nonspecific rash. Will treat symptomatically and encourage PCP recheck next week.   I personally performed the services described in this documentation, which was scribed in my presence. The recorded information has been reviewed and is accurate.     Arnoldo Hooker, PA-C 09/16/12 1748

## 2012-09-16 NOTE — ED Provider Notes (Signed)
Medical screening examination/treatment/procedure(s) were performed by non-physician practitioner and as supervising physician I was immediately available for consultation/collaboration.  Flint Melter, MD 09/16/12 [phone]

## 2012-09-16 NOTE — ED Notes (Signed)
Red raised, itching rash on arms and back x 1 week

## 2012-09-20 ENCOUNTER — Other Ambulatory Visit: Payer: Self-pay | Admitting: Gastroenterology

## 2012-09-21 NOTE — Addendum Note (Signed)
Addended byVida Rigger on: 09/21/2012 01:01 PM   Modules accepted: Orders

## 2012-09-24 ENCOUNTER — Encounter (HOSPITAL_COMMUNITY): Payer: Self-pay | Admitting: *Deleted

## 2012-09-24 ENCOUNTER — Encounter (HOSPITAL_COMMUNITY): Payer: Self-pay | Admitting: Anesthesiology

## 2012-09-24 ENCOUNTER — Encounter (HOSPITAL_COMMUNITY)
Admission: RE | Disposition: A | Discharge status: Home / Self Care | Payer: Self-pay | Source: Ambulatory Visit | Attending: Gastroenterology

## 2012-09-24 ENCOUNTER — Ambulatory Visit (HOSPITAL_COMMUNITY): Payer: Medicare Other | Admitting: Anesthesiology

## 2012-09-24 ENCOUNTER — Ambulatory Visit (HOSPITAL_COMMUNITY)
Admission: RE | Admit: 2012-09-24 | Discharge: 2012-09-24 | Disposition: A | Discharge status: Home / Self Care | Payer: Medicare Other | Source: Ambulatory Visit | Attending: Gastroenterology | Admitting: Gastroenterology

## 2012-09-24 DIAGNOSIS — Z1211 Encounter for screening for malignant neoplasm of colon: Secondary | ICD-10-CM | POA: Insufficient documentation

## 2012-09-24 DIAGNOSIS — D126 Benign neoplasm of colon, unspecified: Secondary | ICD-10-CM | POA: Insufficient documentation

## 2012-09-24 DIAGNOSIS — M1990 Unspecified osteoarthritis, unspecified site: Secondary | ICD-10-CM | POA: Insufficient documentation

## 2012-09-24 DIAGNOSIS — F319 Bipolar disorder, unspecified: Secondary | ICD-10-CM | POA: Insufficient documentation

## 2012-09-24 DIAGNOSIS — Z6841 Body Mass Index (BMI) 40.0 and over, adult: Secondary | ICD-10-CM | POA: Insufficient documentation

## 2012-09-24 DIAGNOSIS — I10 Essential (primary) hypertension: Secondary | ICD-10-CM | POA: Insufficient documentation

## 2012-09-24 DIAGNOSIS — M5137 Other intervertebral disc degeneration, lumbosacral region: Secondary | ICD-10-CM | POA: Insufficient documentation

## 2012-09-24 DIAGNOSIS — M51379 Other intervertebral disc degeneration, lumbosacral region without mention of lumbar back pain or lower extremity pain: Secondary | ICD-10-CM | POA: Insufficient documentation

## 2012-09-24 DIAGNOSIS — Z79899 Other long term (current) drug therapy: Secondary | ICD-10-CM | POA: Insufficient documentation

## 2012-09-24 HISTORY — PX: COLONOSCOPY WITH PROPOFOL: SHX5780

## 2012-09-24 HISTORY — DX: Depression, unspecified: F32.A

## 2012-09-24 HISTORY — DX: Other muscle spasm: M62.838

## 2012-09-24 HISTORY — DX: Major depressive disorder, single episode, unspecified: F32.9

## 2012-09-24 SURGERY — COLONOSCOPY WITH PROPOFOL
Anesthesia: Monitor Anesthesia Care

## 2012-09-24 MED ORDER — PROPOFOL INFUSION 10 MG/ML OPTIME
INTRAVENOUS | Status: DC | PRN
  Administered 2012-09-24: 100 ug/kg/min via INTRAVENOUS

## 2012-09-24 MED ORDER — KETAMINE HCL 50 MG/ML IJ SOLN
INTRAMUSCULAR | Status: DC | PRN
  Administered 2012-09-24: 25 mg via INTRAMUSCULAR

## 2012-09-24 MED ORDER — LACTATED RINGERS IV SOLN
INTRAVENOUS | Status: DC
  Administered 2012-09-24: 10:00:00 via INTRAVENOUS

## 2012-09-24 MED ORDER — PROPOFOL 10 MG/ML IV EMUL
INTRAVENOUS | Status: DC | PRN
  Administered 2012-09-24: 30 mg via INTRAVENOUS
  Administered 2012-09-24 (×2): 20 mg via INTRAVENOUS
  Administered 2012-09-24: 30 mg via INTRAVENOUS

## 2012-09-24 MED ORDER — SODIUM CHLORIDE 0.9 % IV SOLN: INTRAVENOUS | Status: DC

## 2012-09-24 MED ORDER — LACTATED RINGERS IV SOLN
INTRAVENOUS | Status: DC | PRN
  Administered 2012-09-24: 10:00:00 via INTRAVENOUS

## 2012-09-24 SURGICAL SUPPLY — 21 items

## 2012-09-24 NOTE — Preoperative (Signed)
Beta Blockers   Reason not to administer Beta Blockers:Not Applicable 

## 2012-09-24 NOTE — Anesthesia Postprocedure Evaluation (Signed)
Anesthesia Post Note  Patient: Carla Sloan  Procedure(s) Performed: Procedure(s) (LRB): COLONOSCOPY WITH PROPOFOL (N/A)  Anesthesia type: MAC  Patient location: PACU  Post pain: Pain level controlled  Post assessment: Post-op Vital signs reviewed  Last Vitals: BP 115/92  Temp(Src) 36.4 C (Oral)  Resp 27  Ht 5\' 6"  (1.676 m)  Wt 370 lb (167.831 kg)  BMI 59.75 kg/m2  SpO2 100%  LMP 07/31/2012  Post vital signs: Reviewed  Level of consciousness: awake  Complications: No apparent anesthesia complications

## 2012-09-24 NOTE — Op Note (Signed)
[facility] [address] [place], [address]   COLONOSCOPY PROCEDURE REPORT  PATIENT: Carla, Sloan  MR#: [id number] BIRTHDATE: [birth year] , 52  yrs. old GENDER: Female ENDOSCOPIST: Vida Rigger, MD REFERRED [id number] Concepcion Elk, M.D. PROCEDURE DATE:  09/24/2012 PROCEDURE:   Colonoscopy with snare polypectomy ASA CLASS:   Class III INDICATIONS:Average risk patient for colon cancer. MEDICATIONS: propofol (Diprivan) 550mg  IV   ketamine 25 mg  DESCRIPTION OF PROCEDURE:   After the risks benefits and alternatives of the procedure were thoroughly explained, informed consent was obtained.  The [id number] ([id number])  endoscope was introduced through the anus and advanced to the cecum, which was identified by both the appendix and ileocecal valve , limited by No adverse events experienced. this did not require any position change or any abdominal pressure  The quality of the prep was adequate. .  The instrument was then slowly withdrawn as the colon was fully examined.the patient tolerated the procedure well there was no obvious immediate complications and the findings are recorded below       FINDINGS: 1 small mid sigmoid polyp status post snare 2. Medium size splenic flexure polyp status post snare 3. A small transverse and ascending polyp status post snare 4. Otherwise within normal limits to the cecum  COMPLICATIONS: none  IMPRESSION:  above  RECOMMENDATIONS: await pathology to determine future screening but probably recheck in 3 year and happy to see back when necessary   _______________________________ eSigned:  Vida Rigger, MD 09/24/2012 11:04 AM   QM:VHQIO Concepcion Elk, MD

## 2012-09-24 NOTE — Transfer of Care (Signed)
Immediate Anesthesia Transfer of Care Note  Patient: Carla Sloan  Procedure(s) Performed: Procedure(s) (LRB): COLONOSCOPY WITH PROPOFOL (N/A)  Patient Location: PACU  Anesthesia Type: MAC  Level of Consciousness: sedated, patient cooperative and responds to stimulaton  Airway & Oxygen Therapy: Patient Spontanous Breathing and Patient connected to face mask oxgen  Post-op Assessment: Report given to PACU RN and Post -op Vital signs reviewed and stable  Post vital signs: Reviewed and stable  Complications: No apparent anesthesia complications

## 2012-09-24 NOTE — Progress Notes (Signed)
Carla Sloan 10:17 AM  Subjective: Patient without any new complaints since we saw her in the office and no complaints today at all  Objective: Vital signs stable afebrile no acute distress physical exam please see pre-assessment evaluation  Assessment: Patient due for colonic screen  Plan: Okay to proceed with anesthesia and colonoscopy today  [facility] E

## 2012-09-24 NOTE — Anesthesia Preprocedure Evaluation (Addendum)
Anesthesia Evaluation  Patient identified by MRN, date of birth, ID band Patient awake    Reviewed: Allergy & Precautions, H&P , NPO status , Patient's Chart, lab work & pertinent test results  Airway Mallampati: II TM Distance: >3 FB Neck ROM: Full    Dental  (+) Dental Advisory Given, Poor Dentition and Missing   Pulmonary neg pulmonary ROS,  breath sounds clear to auscultation        Cardiovascular hypertension, Pt. on medications Rhythm:Regular Rate:Normal     Neuro/Psych PSYCHIATRIC DISORDERS Depression negative neurological ROS     GI/Hepatic negative GI ROS, Neg liver ROS,   Endo/Other  Morbid obesity  Renal/GU negative Renal ROS     Musculoskeletal negative musculoskeletal ROS (+)   Abdominal (+) + obese,   Peds  Hematology negative hematology ROS (+)   Anesthesia Other Findings   Reproductive/Obstetrics                         Anesthesia Physical Anesthesia Plan  ASA: III  Anesthesia Plan: MAC   Post-op Pain Management:    Induction: Intravenous  Airway Management Planned:   Additional Equipment:   Intra-op Plan:   Post-operative Plan:   Informed Consent: I have reviewed the patients History and Physical, chart, labs and discussed the procedure including the risks, benefits and alternatives for the proposed anesthesia with the patient or authorized representative who has indicated his/her understanding and acceptance.   Dental advisory given  Plan Discussed with: CRNA  Anesthesia Plan Comments:     Anesthesia Quick Evaluation

## 2012-09-27 ENCOUNTER — Encounter (HOSPITAL_COMMUNITY): Payer: Self-pay | Admitting: Gastroenterology

## 2012-10-04 ENCOUNTER — Ambulatory Visit
Admission: RE | Admit: 2012-10-04 | Discharge: 2012-10-04 | Disposition: A | Discharge status: Home / Self Care | Payer: Medicare Other | Source: Ambulatory Visit

## 2012-10-04 DIAGNOSIS — Z1231 Encounter for screening mammogram for malignant neoplasm of breast: Secondary | ICD-10-CM

## 2013-05-17 ENCOUNTER — Ambulatory Visit: Payer: Medicare Other | Admitting: Obstetrics

## 2013-06-06 ENCOUNTER — Ambulatory Visit: Payer: Self-pay | Admitting: Obstetrics

## 2013-06-13 ENCOUNTER — Ambulatory Visit: Payer: Self-pay | Admitting: Obstetrics

## 2013-06-23 ENCOUNTER — Ambulatory Visit: Payer: Self-pay | Admitting: Obstetrics

## 2013-06-27 ENCOUNTER — Encounter: Payer: Self-pay | Admitting: Obstetrics

## 2013-06-27 ENCOUNTER — Ambulatory Visit (INDEPENDENT_AMBULATORY_CARE_PROVIDER_SITE_OTHER): Payer: Medicare Other | Admitting: Obstetrics

## 2013-06-27 VITALS — BP 158/96 | HR 90 | Temp 98.0°F | Ht 66.0 in | Wt 347.0 lb

## 2013-06-27 DIAGNOSIS — N760 Acute vaginitis: Secondary | ICD-10-CM

## 2013-06-27 DIAGNOSIS — Z124 Encounter for screening for malignant neoplasm of cervix: Secondary | ICD-10-CM

## 2013-06-27 DIAGNOSIS — Z01419 Encounter for gynecological examination (general) (routine) without abnormal findings: Secondary | ICD-10-CM

## 2013-06-27 DIAGNOSIS — Z3202 Encounter for pregnancy test, result negative: Secondary | ICD-10-CM

## 2013-06-27 LAB — POCT URINE PREGNANCY: PREG TEST UR: NEGATIVE

## 2013-06-27 NOTE — Addendum Note (Signed)
Addended by: Lewie Loron D on: 06/27/2013 05:36 PM   Modules accepted: Orders

## 2013-06-27 NOTE — Addendum Note (Signed)
Addended by: Valli Glance F on: 06/27/2013 04:02 PM   Modules accepted: Orders

## 2013-06-27 NOTE — Progress Notes (Addendum)
Subjective:     Carla Sloan is a 53 y.o. female here for a routine exam.  Current complaints: None.    Personal health questionnaire:  Is patient [name], have a family history of breast and/or ovarian cancer: Yes, mother with H/O Breast CA Is there a family history of uterine cancer diagnosed at age < 67, gastrointestinal cancer, urinary tract cancer, family member who is a [name]: no Is the patient overweight and hypertensive, family history of diabetes, personal history of gestational diabetes or PCOS: yes Is patient over 55, have PCOS,  family history of premature CHD under age 2, diabetes, smoke, have hypertension or peripheral artery disease:  no  The HPI was reviewed and explored in further detail by the provider. Gynecologic History Patient's last menstrual period was 05/11/2013. Contraception: none Last Pap: Unknown. Results were: normal Last mammogram: 1/ 2015. Results were: normal  Obstetric History OB History  Gravida Para Term Preterm AB SAB TAB Ectopic Multiple Living  2 2 2       2     # Outcome Date GA Lbr Len/2nd Weight Sex Delivery Anes PTL Lv  2 TRM 12/09/93 [redacted]w[redacted]d  8 lb 6 oz (3.799 kg) M SVD EPI  Y  1 TRM 10/26/90 [redacted]w[redacted]d  6 lb 6 oz (2.892 kg) F SVD EPI  Y      Past Medical History  Diagnosis Date  . Hypertension   . Depression   . Arthritis   . Muscle spasms of lower extremity     and back    Past Surgical History  Procedure Laterality Date  . No past surgeries    . Colonoscopy with propofol N/A 09/24/2012    Procedure: COLONOSCOPY WITH PROPOFOL;  Surgeon: Jeryl Columbia, MD;  Location: [facility];  Service: Endoscopy;  Laterality: N/A;    Current outpatient prescriptions:citalopram (CELEXA) 40 MG tablet, Take 40 mg by mouth at bedtime., Disp: , Rfl: ;  cyclobenzaprine (FLEXERIL) 5 MG tablet, , Disp: , Rfl: ;  diphenhydrAMINE (BENADRYL) 25 MG tablet, Take 25 mg by mouth every 6 (six) hours as needed for itching or  allergies., Disp: , Rfl: ;  famotidine (PEPCID) 20 MG tablet, Take 20 mg by mouth 2 (two) times daily., Disp: , Rfl:  gabapentin (NEURONTIN) 400 MG capsule, Take 400-800 mg by mouth 2 (two) times daily. 1 cap in the morning and 2 cap at night, Disp: , Rfl: ;  hydrochlorothiazide (HYDRODIURIL) 25 MG tablet, Take 25 mg by mouth every morning. , Disp: , Rfl: ;  Lurasidone HCl (LATUDA) 120 MG TABS, Take 120 mg by mouth at bedtime., Disp: , Rfl: ;  verapamil (VERELAN PM) 360 MG 24 hr capsule, Take 360 mg by mouth at bedtime., Disp: , Rfl:  No Known Allergies  History  Substance Use Topics  . Smoking status: Never Smoker   . Smokeless tobacco: Never Used  . Alcohol Use: No    Family History  Problem Relation Age of Onset  . Hypertension Mother   . Diabetes Mother   . Diabetes Father   . Hypertension Father       Review of Systems  Constitutional: negative for fatigue and weight loss Respiratory: negative for cough and wheezing Cardiovascular: negative for chest pain, fatigue and palpitations Gastrointestinal: negative for abdominal pain and change in bowel habits Musculoskeletal:negative for myalgias Neurological: negative for gait problems and tremors Behavioral/Psych: negative for abusive relationship, depression Endocrine: negative for temperature intolerance   Genitourinary:negative for abnormal menstrual periods,  genital lesions, hot flashes, sexual problems and vaginal discharge Integument/breast: negative for breast lump, breast tenderness, nipple discharge and skin lesion(s)    Objective:       General:   alert  Skin:   no rash or abnormalities  Lungs:   clear to auscultation bilaterally  Heart:   regular rate and rhythm, S1, S2 normal, no murmur, click, rub or gallop  Breasts:   normal without suspicious masses, skin or nipple changes or axillary nodes  Abdomen:  normal findings: no organomegaly, soft, non-tender and no hernia  Pelvis:  External genitalia: normal general  appearance Urinary system: urethral meatus normal and bladder without fullness, nontender Vaginal: normal without tenderness, induration or masses Cervix: normal appearance Adnexa: normal bimanual exam Uterus: anteverted and non-tender, normal size   Lab Review Urine pregnancy test Labs reviewed yes Radiologic studies reviewed no    Assessment:    Healthy female exam.   Morbid obesity   Plan:    Education reviewed: low fat, low cholesterol diet, safe sex/STD prevention, self breast exams and weight bearing exercise.   Meds ordered this encounter  Medications  . verapamil (VERELAN PM) 360 MG 24 hr capsule    Sig: Take 360 mg by mouth at bedtime.  . cyclobenzaprine (FLEXERIL) 5 MG tablet    Sig:    No orders of the defined types were placed in this encounter.   Need to obtain previous records Possible management options include:  Weight loss management program and consider Bariatric Surgery Consultation. Follow up as needed.

## 2013-06-28 LAB — WET PREP BY MOLECULAR PROBE
Candida species: NEGATIVE
Gardnerella vaginalis: POSITIVE — AB
Trichomonas vaginosis: POSITIVE — AB

## 2013-06-28 LAB — PAP IG W/ RFLX HPV ASCU

## 2013-06-28 LAB — GC/CHLAMYDIA PROBE AMP
CT Probe RNA: NEGATIVE
GC Probe RNA: NEGATIVE

## 2013-07-27 ENCOUNTER — Encounter: Payer: Self-pay | Admitting: *Deleted

## 2013-07-29 ENCOUNTER — Other Ambulatory Visit: Payer: Self-pay | Admitting: *Deleted

## 2013-07-29 DIAGNOSIS — N760 Acute vaginitis: Principal | ICD-10-CM

## 2013-07-29 DIAGNOSIS — A5901 Trichomonal vulvovaginitis: Secondary | ICD-10-CM

## 2013-07-29 DIAGNOSIS — B9689 Other specified bacterial agents as the cause of diseases classified elsewhere: Secondary | ICD-10-CM

## 2013-07-29 MED ORDER — METRONIDAZOLE 500 MG PO TABS: 2000.0000 mg | ORAL_TABLET | Freq: Once | ORAL | Status: DC

## 2013-08-23 ENCOUNTER — Other Ambulatory Visit: Payer: Self-pay | Admitting: Obstetrics

## 2013-12-26 ENCOUNTER — Encounter: Payer: Self-pay | Admitting: Obstetrics

## 2014-03-28 DIAGNOSIS — I10 Essential (primary) hypertension: Secondary | ICD-10-CM | POA: Diagnosis not present

## 2014-03-28 DIAGNOSIS — Z131 Encounter for screening for diabetes mellitus: Secondary | ICD-10-CM | POA: Diagnosis not present

## 2014-03-28 DIAGNOSIS — M179 Osteoarthritis of knee, unspecified: Secondary | ICD-10-CM | POA: Diagnosis not present

## 2014-03-28 DIAGNOSIS — E784 Other hyperlipidemia: Secondary | ICD-10-CM | POA: Diagnosis not present

## 2014-03-28 DIAGNOSIS — F319 Bipolar disorder, unspecified: Secondary | ICD-10-CM | POA: Diagnosis not present

## 2014-03-28 DIAGNOSIS — M5136 Other intervertebral disc degeneration, lumbar region: Secondary | ICD-10-CM | POA: Diagnosis not present

## 2014-03-28 DIAGNOSIS — Z23 Encounter for immunization: Secondary | ICD-10-CM | POA: Diagnosis not present

## 2014-04-05 DIAGNOSIS — H2513 Age-related nuclear cataract, bilateral: Secondary | ICD-10-CM | POA: Diagnosis not present

## 2014-04-05 DIAGNOSIS — H25013 Cortical age-related cataract, bilateral: Secondary | ICD-10-CM | POA: Diagnosis not present

## 2014-04-05 DIAGNOSIS — H4011X2 Primary open-angle glaucoma, moderate stage: Secondary | ICD-10-CM | POA: Diagnosis not present

## 2014-05-22 ENCOUNTER — Other Ambulatory Visit (HOSPITAL_COMMUNITY): Payer: Self-pay | Admitting: Internal Medicine

## 2014-05-22 DIAGNOSIS — Z1231 Encounter for screening mammogram for malignant neoplasm of breast: Secondary | ICD-10-CM

## 2014-05-23 ENCOUNTER — Emergency Department (HOSPITAL_COMMUNITY)
Admission: EM | Admit: 2014-05-23 | Discharge: 2014-05-23 | Disposition: A | Discharge status: Home / Self Care | Payer: PPO | Attending: Emergency Medicine | Admitting: Emergency Medicine

## 2014-05-23 ENCOUNTER — Emergency Department (HOSPITAL_COMMUNITY): Payer: PPO

## 2014-05-23 ENCOUNTER — Encounter (HOSPITAL_COMMUNITY): Payer: Self-pay | Admitting: Emergency Medicine

## 2014-05-23 DIAGNOSIS — M1990 Unspecified osteoarthritis, unspecified site: Secondary | ICD-10-CM | POA: Diagnosis not present

## 2014-05-23 DIAGNOSIS — R42 Dizziness and giddiness: Secondary | ICD-10-CM

## 2014-05-23 DIAGNOSIS — Z79899 Other long term (current) drug therapy: Secondary | ICD-10-CM | POA: Diagnosis not present

## 2014-05-23 DIAGNOSIS — I10 Essential (primary) hypertension: Secondary | ICD-10-CM | POA: Insufficient documentation

## 2014-05-23 DIAGNOSIS — R079 Chest pain, unspecified: Secondary | ICD-10-CM

## 2014-05-23 DIAGNOSIS — Z792 Long term (current) use of antibiotics: Secondary | ICD-10-CM | POA: Diagnosis not present

## 2014-05-23 DIAGNOSIS — E669 Obesity, unspecified: Secondary | ICD-10-CM | POA: Insufficient documentation

## 2014-05-23 LAB — CBC WITH DIFFERENTIAL/PLATELET
BASOS ABS: 0 10*3/uL (ref 0.0–0.1)
BASOS PCT: 0 % (ref 0–1)
EOS ABS: 0.1 10*3/uL (ref 0.0–0.7)
Eosinophils Relative: 2 % (ref 0–5)
HCT: 40.4 % (ref 36.0–46.0)
HEMOGLOBIN: 12.9 g/dL (ref 12.0–15.0)
LYMPHS PCT: 36 % (ref 12–46)
Lymphs Abs: 2.5 10*3/uL (ref 0.7–4.0)
MCH: 28.2 pg (ref 26.0–34.0)
MCHC: 31.9 g/dL (ref 30.0–36.0)
MCV: 88.4 fL (ref 78.0–100.0)
Monocytes Absolute: 0.3 10*3/uL (ref 0.1–1.0)
Monocytes Relative: 5 % (ref 3–12)
NEUTROS ABS: 3.9 10*3/uL (ref 1.7–7.7)
Neutrophils Relative %: 57 % (ref 43–77)
Platelets: 334 10*3/uL (ref 150–400)
RBC: 4.57 MIL/uL (ref 3.87–5.11)
RDW: 14.7 % (ref 11.5–15.5)
WBC: 6.8 10*3/uL (ref 4.0–10.5)

## 2014-05-23 LAB — BASIC METABOLIC PANEL
Anion gap: 4 — ABNORMAL LOW (ref 5–15)
BUN: 7 mg/dL (ref 6–23)
CALCIUM: 9.4 mg/dL (ref 8.4–10.5)
CHLORIDE: 106 mmol/L (ref 96–112)
CO2: 30 mmol/L (ref 19–32)
CREATININE: 0.96 mg/dL (ref 0.50–1.10)
GFR calc Af Amer: 77 mL/min — ABNORMAL LOW (ref >90)
GFR calc non Af Amer: 67 mL/min — ABNORMAL LOW (ref >90)
GLUCOSE: 129 mg/dL — AB (ref 70–99)
Potassium: 3.9 mmol/L (ref 3.5–5.1)
Sodium: 140 mmol/L (ref 135–145)

## 2014-05-23 LAB — I-STAT TROPONIN, ED
Troponin i, poc: 0 ng/mL (ref 0.00–0.08)
Troponin i, poc: 0 ng/mL (ref 0.00–0.08)

## 2014-05-23 NOTE — ED Notes (Signed)
Pt sts mid sternal CP starting today with dizziness x 3 days; pt sts some nausea

## 2014-05-23 NOTE — ED Provider Notes (Signed)
CSN: [id number]     Arrival date & time 05/23/14  1332 History   First MD Initiated Contact with Patient 05/23/14 1629     Chief Complaint  Patient presents with  . Chest Pain  . Dizziness     (Consider location/radiation/quality/duration/timing/severity/associated sxs/prior Treatment) Patient is a 54 y.o. female presenting with chest pain and dizziness. The history is provided by the patient. No language interpreter was used.  Chest Pain Associated symptoms: dizziness   Dizziness Associated symptoms: chest pain   Ms. Santucci is a 54 y.o black female with a history of htn and hyperlipidemia who presents for left sided chest pain and dizziness that began 2 days ago while at rest.  She states that laying down make it worse and she feel as though the room is spinning.  The chest pain is intermittent and sharp, lasting only 1-2 seconds without radiation. It is a 6/10 during the episodes. She denies any fever, headache, shortness of breath, cough, abdominal pain, urinary symptoms or leg swelling. She denies a previous blood clot or recent travel. No known family history for CAD. No history of smoking.    Past Medical History  Diagnosis Date  . Hypertension   . Depression   . Arthritis   . Muscle spasms of lower extremity     and back   Past Surgical History  Procedure Laterality Date  . No past surgeries    . Colonoscopy with propofol N/A 09/24/2012    Procedure: COLONOSCOPY WITH PROPOFOL;  Surgeon: Jeryl Columbia, MD;  Location: [facility];  Service: Endoscopy;  Laterality: N/A;   Family History  Problem Relation Age of Onset  . Hypertension Mother   . Diabetes Mother   . Diabetes Father   . Hypertension Father    History  Substance Use Topics  . Smoking status: Never Smoker   . Smokeless tobacco: Never Used  . Alcohol Use: No   OB History    Gravida Para Term Preterm AB TAB SAB Ectopic Multiple Living   2 2 2       2      Review of Systems  Cardiovascular: Positive for  chest pain.  Neurological: Positive for dizziness.  All other systems reviewed and are negative.     Allergies  Review of patient's allergies indicates no known allergies.  Home Medications   Prior to Admission medications   Medication Sig Start Date End Date Taking? Authorizing Provider  citalopram (CELEXA) 40 MG tablet Take 40 mg by mouth at bedtime.    Historical Provider, MD  cyclobenzaprine (FLEXERIL) 5 MG tablet  06/20/13   Historical Provider, MD  diphenhydrAMINE (BENADRYL) 25 MG tablet Take 25 mg by mouth every 6 (six) hours as needed for itching or allergies.    Historical Provider, MD  famotidine (PEPCID) 20 MG tablet Take 20 mg by mouth 2 (two) times daily.    Historical Provider, MD  gabapentin (NEURONTIN) 400 MG capsule Take 400-800 mg by mouth 2 (two) times daily. 1 cap in the morning and 2 cap at night    Historical Provider, MD  hydrochlorothiazide (HYDRODIURIL) 25 MG tablet Take 25 mg by mouth every morning.     Historical Provider, MD  Lurasidone HCl (LATUDA) 120 MG TABS Take 120 mg by mouth at bedtime.    Historical Provider, MD  metroNIDAZOLE (FLAGYL) 500 MG tablet take 4 tablets by mouth once daily then 1 tablet twice a day for 7 days    Shelly Bombard, MD  verapamil (VERELAN PM) 360 MG 24 hr capsule Take 360 mg by mouth at bedtime.    Historical Provider, MD   BP 101/60 mmHg  Pulse 88  Temp(Src) 97.6 F (36.4 C) (Oral)  Resp 20  SpO2 99%  LMP 05/04/2014 Physical Exam  Constitutional: She is oriented to person, place, and time. She appears well-developed and well-nourished.  obese  HENT:  Head: Normocephalic and atraumatic.  Right Ear: External ear normal.  Left Ear: External ear normal.  Eyes: Conjunctivae are normal.  Neck: Normal range of motion. Neck supple.  Cardiovascular: Normal rate, regular rhythm and normal heart sounds.   Pulmonary/Chest: Effort normal and breath sounds normal. No respiratory distress. She has no decreased breath sounds. She  has no wheezes.  Abdominal: Soft. There is no tenderness.  Musculoskeletal: Normal range of motion.  Neurological: She is alert and oriented to person, place, and time. She has normal strength. No cranial nerve deficit or sensory deficit.  Skin: Skin is warm and dry.  Nursing note and vitals reviewed.   ED Course  Procedures (including critical care time) Labs Review Labs Reviewed  BASIC METABOLIC PANEL - Abnormal; Notable for the following:    Glucose, Bld 129 (*)    GFR calc non Af Amer 67 (*)    GFR calc Af Amer 77 (*)    Anion gap 4 (*)    All other components within normal limits  CBC WITH DIFFERENTIAL/PLATELET  Randolm Idol, ED  Randolm Idol, ED    Imaging Review Dg Chest 2 View  05/23/2014   CLINICAL DATA:  Chest pain  EXAM: CHEST  2 VIEW  COMPARISON:  07/03/2011  FINDINGS: Cardiac shadow is within normal limits. The left hemidiaphragm is again elevated. No focal infiltrate or sizable effusion is seen. No bony abnormality is noted.  IMPRESSION: No active cardiopulmonary disease.   Electronically Signed   By: Inez Catalina M.D.   On: 05/23/2014 15:01     EKG Interpretation   Date/Time:  Tuesday May 23 2014 13:44:13 EDT Ventricular Rate:  96 PR Interval:  172 QRS Duration: 98 QT Interval:  344 QTC Calculation: 434 R Axis:   67 Text Interpretation:  Normal sinus rhythm Lateral infarct , age  undetermined Cannot rule out Inferior infarct , age undetermined ST \\T \ T  wave abnormality, consider anterior ischemia Abnormal ECG No significant  change since last tracing Confirmed by KNAPP  MD-J, JON ([id number]) on  05/23/2014 5:33:46 PM      MDM   Final diagnoses:  Chest pain, unspecified chest pain type  Dizziness   She presents with 1-2 seconds of intermmitent left sided chest pain for the past 2 days with dizziness when laying down.  Her exam is normal and she is in no pain now.  No dizziness now.     Her labs are unremarkable and her CXR shows no pneumonia  and no effusion.  She does have some non-specific ST changes, her age, and htn, dm, hyperlipidemia, and obesity.  Due to her risk factors and looking at the heart score, I will get a repeat troponin on her. There is no explanation for her dizziness when at rest.  Her electrolytes, hgb, and vitals are stable.   Her repeat troponin is negative. I will have her f/u with her pcp and she agrees with the plan.        Ottie Glazier, PA-C 05/23/14 2150  Dorie Rank, MD 05/23/14 [phone]

## 2014-05-23 NOTE — Discharge Instructions (Signed)

## 2014-05-23 NOTE — ED Notes (Signed)
NAD at this time. Pt is stable and going home.  

## 2014-05-29 ENCOUNTER — Ambulatory Visit (HOSPITAL_COMMUNITY): Payer: Medicare Other

## 2014-05-30 ENCOUNTER — Ambulatory Visit (HOSPITAL_COMMUNITY)
Admission: RE | Admit: 2014-05-30 | Discharge: 2014-05-30 | Disposition: A | Discharge status: Home / Self Care | Payer: PPO | Source: Ambulatory Visit | Attending: Internal Medicine | Admitting: Internal Medicine

## 2014-05-30 DIAGNOSIS — Z1231 Encounter for screening mammogram for malignant neoplasm of breast: Secondary | ICD-10-CM | POA: Diagnosis present

## 2014-06-28 ENCOUNTER — Ambulatory Visit: Payer: Medicare Other | Admitting: Obstetrics

## 2015-04-23 ENCOUNTER — Other Ambulatory Visit: Payer: Self-pay

## 2015-04-23 DIAGNOSIS — Z1231 Encounter for screening mammogram for malignant neoplasm of breast: Secondary | ICD-10-CM

## 2015-05-31 ENCOUNTER — Ambulatory Visit
Admission: RE | Admit: 2015-05-31 | Discharge: 2015-05-31 | Disposition: A | Discharge status: Home / Self Care | Payer: Medicare Other | Source: Ambulatory Visit

## 2015-05-31 DIAGNOSIS — Z1231 Encounter for screening mammogram for malignant neoplasm of breast: Secondary | ICD-10-CM

## 2016-05-05 ENCOUNTER — Other Ambulatory Visit: Payer: Self-pay | Admitting: Internal Medicine

## 2016-05-05 DIAGNOSIS — Z1231 Encounter for screening mammogram for malignant neoplasm of breast: Secondary | ICD-10-CM

## 2016-05-13 ENCOUNTER — Other Ambulatory Visit: Payer: Self-pay | Admitting: Gastroenterology

## 2016-05-16 ENCOUNTER — Encounter (HOSPITAL_COMMUNITY): Payer: Self-pay | Admitting: *Deleted

## 2016-05-16 NOTE — Progress Notes (Signed)
Pt denies SOB, chest pain, and being under the care of a cardiologist. Pt made aware to stop taking Aspirin, vitamins, fish oil and herbal medications. Do not take any NSAIDs ie: Ibuprofen, Advil, Naproxen, BC and Goody Powder or any medication containing Aspirin. Pt verbalized understanding of all pre-op instructions. 

## 2016-05-19 ENCOUNTER — Ambulatory Visit (HOSPITAL_COMMUNITY): Payer: Medicare HMO | Admitting: Anesthesiology

## 2016-05-19 ENCOUNTER — Ambulatory Visit (HOSPITAL_COMMUNITY)
Admission: RE | Admit: 2016-05-19 | Discharge: 2016-05-19 | Disposition: A | Discharge status: Home / Self Care | Payer: Medicare HMO | Source: Ambulatory Visit | Attending: Gastroenterology | Admitting: Gastroenterology

## 2016-05-19 ENCOUNTER — Encounter (HOSPITAL_COMMUNITY)
Admission: RE | Disposition: A | Discharge status: Home / Self Care | Payer: Self-pay | Source: Ambulatory Visit | Attending: Gastroenterology

## 2016-05-19 ENCOUNTER — Encounter (HOSPITAL_COMMUNITY): Payer: Self-pay

## 2016-05-19 DIAGNOSIS — Z6841 Body Mass Index (BMI) 40.0 and over, adult: Secondary | ICD-10-CM | POA: Insufficient documentation

## 2016-05-19 DIAGNOSIS — Z1211 Encounter for screening for malignant neoplasm of colon: Secondary | ICD-10-CM | POA: Insufficient documentation

## 2016-05-19 DIAGNOSIS — Z7982 Long term (current) use of aspirin: Secondary | ICD-10-CM | POA: Insufficient documentation

## 2016-05-19 DIAGNOSIS — Z8719 Personal history of other diseases of the digestive system: Secondary | ICD-10-CM | POA: Diagnosis not present

## 2016-05-19 DIAGNOSIS — Z7951 Long term (current) use of inhaled steroids: Secondary | ICD-10-CM | POA: Diagnosis not present

## 2016-05-19 DIAGNOSIS — Z7983 Long term (current) use of bisphosphonates: Secondary | ICD-10-CM | POA: Insufficient documentation

## 2016-05-19 DIAGNOSIS — I10 Essential (primary) hypertension: Secondary | ICD-10-CM | POA: Insufficient documentation

## 2016-05-19 DIAGNOSIS — D123 Benign neoplasm of transverse colon: Secondary | ICD-10-CM | POA: Diagnosis not present

## 2016-05-19 DIAGNOSIS — Z791 Long term (current) use of non-steroidal anti-inflammatories (NSAID): Secondary | ICD-10-CM | POA: Diagnosis not present

## 2016-05-19 DIAGNOSIS — Z79899 Other long term (current) drug therapy: Secondary | ICD-10-CM | POA: Insufficient documentation

## 2016-05-19 DIAGNOSIS — F319 Bipolar disorder, unspecified: Secondary | ICD-10-CM | POA: Insufficient documentation

## 2016-05-19 HISTORY — PX: COLONOSCOPY WITH PROPOFOL: SHX5780

## 2016-05-19 SURGERY — COLONOSCOPY WITH PROPOFOL
Anesthesia: Monitor Anesthesia Care

## 2016-05-19 MED ORDER — KETAMINE HCL 10 MG/ML IJ SOLN
INTRAMUSCULAR | Status: DC | PRN
  Administered 2016-05-19: 25 mg via INTRAVENOUS

## 2016-05-19 MED ORDER — LIDOCAINE HCL (CARDIAC) 20 MG/ML IV SOLN
INTRAVENOUS | Status: DC | PRN
  Administered 2016-05-19: 100 mg via INTRATRACHEAL

## 2016-05-19 MED ORDER — PROPOFOL 10 MG/ML IV BOLUS
INTRAVENOUS | Status: DC | PRN
  Administered 2016-05-19 (×2): 20 mg via INTRAVENOUS
  Administered 2016-05-19: 10 mg via INTRAVENOUS

## 2016-05-19 MED ORDER — GLYCOPYRROLATE 0.2 MG/ML IJ SOLN
INTRAMUSCULAR | Status: DC | PRN
  Administered 2016-05-19: .2 mg via INTRAVENOUS

## 2016-05-19 MED ORDER — SODIUM CHLORIDE 0.9 % IV SOLN: INTRAVENOUS | Status: DC

## 2016-05-19 MED ORDER — PROPOFOL 500 MG/50ML IV EMUL
INTRAVENOUS | Status: DC | PRN
  Administered 2016-05-19: 09:00:00 via INTRAVENOUS
  Administered 2016-05-19: 120 ug/kg/min via INTRAVENOUS

## 2016-05-19 MED ORDER — ESMOLOL HCL 100 MG/10ML IV SOLN
INTRAVENOUS | Status: DC | PRN
  Administered 2016-05-19: 30 mg via INTRAVENOUS

## 2016-05-19 MED ORDER — LACTATED RINGERS IV SOLN
INTRAVENOUS | Status: DC
  Administered 2016-05-19: 1000 mL via INTRAVENOUS
  Administered 2016-05-19: 09:00:00 via INTRAVENOUS

## 2016-05-19 NOTE — Op Note (Signed)
[facility] Patient Name: Carla Sloan Procedure Date : 05/19/2016 MRN: [id number] Attending MD: Clarene Essex , MD Date of Birth: 06/24/1960 CSN: [id number] Age: 56 Admit Type: Outpatient Procedure:                Colonoscopy Indications:              High risk colon cancer surveillance: Personal                            history of colonic polyps, Last colonoscopy: August                            2014 Providers:                Clarene Essex, MD, Carolynn Comment, RN, Corliss Parish, Technician Referring MD:              Medicines:                Propofol total dose 700 mg IV, Ketamine per                            Anesthesia25 mg, lidocaine 100 mg, Robinul 0.2 Complications:            No immediate complications. Estimated Blood Loss:     Estimated blood loss: none. Procedure:                Pre-Anesthesia Assessment:                           - Prior to the procedure, a History and Physical                            was performed, and patient medications and                            allergies were reviewed. The patient's tolerance of                            previous anesthesia was also reviewed. The risks                            and benefits of the procedure and the sedation                            options and risks were discussed with the patient.                            All questions were answered, and informed consent                            was obtained. Prior Anticoagulants: The patient has  taken no previous anticoagulant or antiplatelet                            agents. ASA Grade Assessment: III - A patient with                            severe systemic disease. After reviewing the risks                            and benefits, the patient was deemed in                            satisfactory condition to undergo the procedure.                           After obtaining informed  consent, the colonoscope                            was passed under direct vision. Throughout the                            procedure, the patient's blood pressure, pulse, and                            oxygen saturations were monitored continuously. The                            EC-[id number] ([id number]) scope was introduced through                            the anus and advanced to the the cecum, identified                            by appendiceal orifice and ileocecal valve. The                            ileocecal valve, appendiceal orifice, and rectum                            were photographed. The colonoscopy was performed                            without difficulty. The patient tolerated the                            procedure well. The quality of the bowel                            preparation was adequate. Scope In: 9:21:15 AM Scope Out: 9:42:37 AM Scope Withdrawal Time: 0 hours 15 minutes 15 seconds  Total Procedure Duration: 0 hours 21 minutes 22 seconds  Findings:      A diminutive polyp was found in the distal transverse colon. The polyp       was semi-sessile. Biopsies were taken  with a cold forceps for histology.      The exam was otherwise without abnormality. Impression:               - One diminutive polyp in the distal transverse                            colon. Biopsied.                           - The examination was otherwise normal. Moderate Sedation:      moderate sedation-none Recommendation:           - Patient has a contact number available for                            emergencies. The signs and symptoms of potential                            delayed complications were discussed with the                            patient. Return to normal activities tomorrow.                            Written discharge instructions were provided to the                            patient.                           - Soft diet today.                           -  Continue present medications.                           - Await pathology results.                           - Repeat colonoscopy in 5 years for surveillance                            based on pathology results.                           - Return to GI office PRN.                           - Telephone GI clinic for pathology results in 1                            week.                           - Telephone GI clinic if symptomatic PRN. Procedure Code(s):        --- Professional ---  45380, Colonoscopy, flexible; with biopsy, single                            or multiple Diagnosis Code(s):        --- Professional ---                           Z86.010, Personal history of colonic polyps                           D12.3, Benign neoplasm of transverse colon (hepatic                            flexure or splenic flexure) CPT copyright 2016 American Medical Association. All rights reserved. The codes documented in this report are preliminary and upon coder review may  be revised to meet current compliance requirements. Clarene Essex, MD 05/19/2016 9:56:21 AM This report has been signed electronically. Number of Addenda: 0

## 2016-05-19 NOTE — Anesthesia Postprocedure Evaluation (Signed)
Anesthesia Post Note  Patient: Carla Sloan  Procedure(s) Performed: Procedure(s) (LRB): COLONOSCOPY WITH PROPOFOL (N/A)  Patient location during evaluation: PACU Anesthesia Type: MAC Level of consciousness: awake and alert Pain management: pain level controlled Vital Signs Assessment: post-procedure vital signs reviewed and stable Respiratory status: spontaneous breathing, nonlabored ventilation, respiratory function stable and patient connected to nasal cannula oxygen Cardiovascular status: stable and blood pressure returned to baseline Anesthetic complications: no       Last Vitals:  Vitals:   05/19/16 0742 05/19/16 0950  BP: (!) 133/55 103/63  Pulse: 86 91  Resp: (!) 21 20  Temp: 36.7 C 36.6 C    Last Pain:  Vitals:   05/19/16 0950  TempSrc: Oral                 Tiajuana Amass

## 2016-05-19 NOTE — Transfer of Care (Signed)
Immediate Anesthesia Transfer of Care Note  Patient: Carla Sloan  Procedure(s) Performed: Procedure(s): COLONOSCOPY WITH PROPOFOL (N/A)  Patient Location: PACU  Anesthesia Type:MAC  Level of Consciousness: awake, alert , oriented and patient cooperative  Airway & Oxygen Therapy: Patient Spontanous Breathing and Patient connected to face mask oxygen  Post-op Assessment: Report given to RN and Post -op Vital signs reviewed and stable  Post vital signs: Reviewed and stable  Last Vitals:  Vitals:   05/19/16 0742 05/19/16 0950  BP: (!) 133/55 103/63  Pulse: 86 91  Resp: (!) 21 20  Temp: 36.7 C 36.6 C    Last Pain:  Vitals:   05/19/16 0950  TempSrc: Oral         Complications: No apparent anesthesia complications

## 2016-05-19 NOTE — Progress Notes (Signed)
Grayling Congress 9:09 AM  Subjective: Patient without any GI complaints and we reviewed her previous colonoscopy and no new medical problems since we've seen her  Objective: Vital signs stable afebrile exam please see preassessment evaluation  Assessment: History of colon polyps  Plan: Okay to proceed with repeat colonoscopy with anesthesia assistance  [facility]  Pager [phone] After 5PM or if no answer call [phone]

## 2016-05-19 NOTE — Anesthesia Procedure Notes (Signed)
Procedure Name: MAC Date/Time: 05/19/2016 9:11 AM Performed by: Salli Quarry Karlyn Glasco Pre-anesthesia Checklist: Patient identified, Emergency Drugs available, Suction available and Patient being monitored Oxygen Delivery Method: Simple face mask

## 2016-05-19 NOTE — Anesthesia Preprocedure Evaluation (Signed)
Anesthesia Evaluation  Patient identified by MRN, date of birth, ID band Patient awake    Reviewed: Allergy & Precautions, NPO status , Patient's Chart, lab work & pertinent test results  Airway Mallampati: II  TM Distance: >3 FB Neck ROM: Full    Dental   Pulmonary neg pulmonary ROS,    breath sounds clear to auscultation       Cardiovascular hypertension, Pt. on medications  Rhythm:Regular Rate:Normal     Neuro/Psych negative neurological ROS     GI/Hepatic negative GI ROS, Neg liver ROS,   Endo/Other  Morbid obesity  Renal/GU negative Renal ROS     Musculoskeletal  (+) Arthritis ,   Abdominal   Peds  Hematology negative hematology ROS (+)   Anesthesia Other Findings   Reproductive/Obstetrics                             Anesthesia Physical Anesthesia Plan  ASA: III  Anesthesia Plan: MAC   Post-op Pain Management:    Induction: Intravenous  Airway Management Planned: Natural Airway and Simple Face Mask  Additional Equipment:   Intra-op Plan:   Post-operative Plan:   Informed Consent: I have reviewed the patients History and Physical, chart, labs and discussed the procedure including the risks, benefits and alternatives for the proposed anesthesia with the patient or authorized representative who has indicated his/her understanding and acceptance.     Plan Discussed with: CRNA  Anesthesia Plan Comments:         Anesthesia Quick Evaluation

## 2016-05-19 NOTE — Discharge Instructions (Signed)
YOU HAD AN ENDOSCOPIC PROCEDURE TODAY: Refer to the procedure report and other information in the discharge instructions given to you for any specific questions about what was found during the examination. If this information does not answer your questions, please call [facility] office at [phone] to clarify.   YOU SHOULD EXPECT: Some feelings of bloating in the abdomen. Passage of more gas than usual. Walking can help get rid of the air that was put into your GI tract during the procedure and reduce the bloating. If you had a lower endoscopy (such as a colonoscopy or flexible sigmoidoscopy) you may notice spotting of blood in your stool or on the toilet paper. Some abdominal soreness may be present for a day or two, also.  DIET: Your first meal following the procedure should be a light meal and then it is ok to progress to your normal diet. A half-sandwich or bowl of soup is an example of a good first meal. Heavy or fried foods are harder to digest and may make you feel nauseous or bloated. Drink plenty of fluids but you should avoid alcoholic beverages for 24 hours. If you had a esophageal dilation, please see attached instructions for diet.   ACTIVITY: Your care partner should take you home directly after the procedure. You should plan to take it easy, moving slowly for the rest of the day. You can resume normal activity the day after the procedure however YOU SHOULD NOT DRIVE, use power tools, machinery or perform tasks that involve climbing or major physical exertion for 24 hours (because of the sedation medicines used during the test).   SYMPTOMS TO REPORT IMMEDIATELY: A gastroenterologist can be reached at any hour. Please call [phone]  for any of the following symptoms:  Following lower endoscopy (colonoscopy, flexible sigmoidoscopy) Excessive amounts of blood in the stool  Significant tenderness, worsening of abdominal pains  Swelling of the abdomen that is new, acute  Fever of 100 or  higher    FOLLOW UP:  If any biopsies were taken you will be contacted by phone or by letter within the next 1-3 weeks. Call [phone]  if you have not heard about the biopsies in 3 weeks.  Please also call with any specific questions about appointments or follow up tests.   Call if question or problem otherwise call in 1 week for biopsy report and probably repeat colonoscopy in 5 years

## 2016-05-20 ENCOUNTER — Encounter (HOSPITAL_COMMUNITY): Payer: Self-pay | Admitting: Gastroenterology

## 2016-06-02 ENCOUNTER — Ambulatory Visit
Admission: RE | Admit: 2016-06-02 | Discharge: 2016-06-02 | Disposition: A | Discharge status: Home / Self Care | Payer: Medicare HMO | Source: Ambulatory Visit | Attending: Internal Medicine | Admitting: Internal Medicine

## 2016-06-02 DIAGNOSIS — Z1231 Encounter for screening mammogram for malignant neoplasm of breast: Secondary | ICD-10-CM

## 2016-10-23 ENCOUNTER — Other Ambulatory Visit (HOSPITAL_COMMUNITY)
Admission: RE | Admit: 2016-10-23 | Discharge: 2016-10-23 | Disposition: A | Discharge status: Home / Self Care | Payer: Medicare HMO | Source: Ambulatory Visit | Attending: Obstetrics | Admitting: Obstetrics

## 2016-10-23 ENCOUNTER — Encounter: Payer: Self-pay | Admitting: Obstetrics

## 2016-10-23 ENCOUNTER — Ambulatory Visit (INDEPENDENT_AMBULATORY_CARE_PROVIDER_SITE_OTHER): Payer: Medicare HMO | Admitting: Obstetrics

## 2016-10-23 VITALS — BP 128/87 | HR 84 | Ht 66.0 in | Wt 373.5 lb

## 2016-10-23 DIAGNOSIS — Z01419 Encounter for gynecological examination (general) (routine) without abnormal findings: Secondary | ICD-10-CM | POA: Diagnosis present

## 2016-10-23 DIAGNOSIS — N898 Other specified noninflammatory disorders of vagina: Secondary | ICD-10-CM | POA: Insufficient documentation

## 2016-10-23 DIAGNOSIS — Z780 Asymptomatic menopausal state: Secondary | ICD-10-CM

## 2016-10-23 NOTE — Progress Notes (Signed)
Subjective:        Carla Sloan is a 56 y.o. female here for a routine exam.  Current complaints: None.    Personal health questionnaire:  Is patient [name], have a family history of breast and/or ovarian cancer: no Is there a family history of uterine cancer diagnosed at age < 15, gastrointestinal cancer, urinary tract cancer, family member who is a [name]: no Is the patient overweight and hypertensive, family history of diabetes, personal history of gestational diabetes, preeclampsia or PCOS: no Is patient over 105, have PCOS,  family history of premature CHD under age 18, diabetes, smoke, have hypertension or peripheral artery disease:  no At any time, has a partner hit, kicked or otherwise hurt or frightened you?: no Over the past 2 weeks, have you felt down, depressed or hopeless?: no Over the past 2 weeks, have you felt little interest or pleasure in doing things?:no   Gynecologic History No LMP recorded. Patient is not currently having periods (Reason: Perimenopausal). Contraception: post menopausal status Last Pap: unknown. Results were: normal Last mammogram: 2018. Results were: normal  Obstetric History OB History  Gravida Para Term Preterm AB Living  2 2 2     2   SAB TAB Ectopic Multiple Live Births          2    # Outcome Date GA Lbr Len/2nd Weight Sex Delivery Anes PTL Lv  2 Term 12/09/93 [redacted]w[redacted]d  8 lb 6 oz (3.799 kg) M Vag-Spont EPI  LIV  1 Term 10/26/90 [redacted]w[redacted]d  6 lb 6 oz (2.892 kg) F Vag-Spont EPI  LIV      Past Medical History:  Diagnosis Date  . Arthritis   . Depression   . Hypertension   . Muscle spasms of lower extremity    and back    Past Surgical History:  Procedure Laterality Date  . COLONOSCOPY WITH PROPOFOL N/A 09/24/2012   Procedure: COLONOSCOPY WITH PROPOFOL;  Surgeon: Jeryl Columbia, MD;  Location: [facility] ENDOSCOPY;  Service: Endoscopy;  Laterality: N/A;  . COLONOSCOPY WITH PROPOFOL N/A 05/19/2016    Procedure: COLONOSCOPY WITH PROPOFOL;  Surgeon: Clarene Essex, MD;  Location: [facility] ENDOSCOPY;  Service: Endoscopy;  Laterality: N/A;  . NO PAST SURGERIES       Current Outpatient Prescriptions:  .  citalopram (CELEXA) 40 MG tablet, Take 40 mg by mouth at bedtime., Disp: , Rfl:  .  cyclobenzaprine (FLEXERIL) 5 MG tablet, , Disp: , Rfl:  .  diphenhydrAMINE (BENADRYL) 25 MG tablet, Take 25 mg by mouth every 6 (six) hours as needed for itching or allergies., Disp: , Rfl:  .  famotidine (PEPCID) 20 MG tablet, Take 20 mg by mouth 2 (two) times daily., Disp: , Rfl:  .  gabapentin (NEURONTIN) 400 MG capsule, Take 400-800 mg by mouth 2 (two) times daily. 1 cap in the morning and 2 cap at night, Disp: , Rfl:  .  hydrochlorothiazide (HYDRODIURIL) 25 MG tablet, Take 25 mg by mouth every morning. , Disp: , Rfl:  .  Lurasidone HCl (LATUDA) 120 MG TABS, Take 120 mg by mouth at bedtime., Disp: , Rfl:  .  metroNIDAZOLE (FLAGYL) 500 MG tablet, take 4 tablets by mouth once daily then 1 tablet twice a day for 7 days, Disp: 18 tablet, Rfl: 0 .  verapamil (VERELAN PM) 360 MG 24 hr capsule, Take 360 mg by mouth at bedtime., Disp: , Rfl:  No Known Allergies  Social History  Substance Use Topics  .  Smoking status: Never Smoker  . Smokeless tobacco: Never Used  . Alcohol use No    Family History  Problem Relation Age of Onset  . Hypertension Mother   . Diabetes Mother   . Breast cancer Mother   . Diabetes Father   . Hypertension Father       Review of Systems  Constitutional: negative for fatigue and weight loss Respiratory: negative for cough and wheezing Cardiovascular: negative for chest pain, fatigue and palpitations Gastrointestinal: negative for abdominal pain and change in bowel habits Musculoskeletal:negative for myalgias Neurological: negative for gait problems and tremors Behavioral/Psych: negative for abusive relationship, depression Endocrine: negative for temperature intolerance     Genitourinary:negative for abnormal menstrual periods, genital lesions, hot flashes, sexual problems and vaginal discharge Integument/breast: negative for breast lump, breast tenderness, nipple discharge and skin lesion(s)    Objective:       BP 128/87   Pulse 84   Ht 5\' 6"  (1.676 m)   Wt (!) 373 lb 8 oz (169.4 kg)   BMI 60.28 kg/m  General:   alert  Skin:   no rash or abnormalities  Lungs:   clear to auscultation bilaterally  Heart:   regular rate and rhythm, S1, S2 normal, no murmur, click, rub or gallop  Breasts:   normal without suspicious masses, skin or nipple changes or axillary nodes  Abdomen:  normal findings: no organomegaly, soft, non-tender and no hernia  Pelvis:  External genitalia: normal general appearance Urinary system: urethral meatus normal and bladder without fullness, nontender Vaginal: normal without tenderness, induration or masses Cervix: normal appearance Adnexa: normal bimanual exam Uterus: anteverted and non-tender, normal size   Lab Review Urine pregnancy test Labs reviewed yes Radiologic studies reviewed yes  50% of 20 min visit spent on counseling and coordination of care.    Assessment:     1. Encounter for routine gynecological examination with Papanicolaou smear of cervix Rx: - Cytology - PAP  2. Asymptomatic menopause   3. Morbid obesity ([place]) - program of weight management including caloric reduction and exercise recommended   4. Vaginal discharge Rx: - Cervicovaginal ancillary only   Plan:    Education reviewed: calcium supplements, depression evaluation, low fat, low cholesterol diet, self breast exams and weight bearing exercise. Follow up in: 2 years.   No orders of the defined types were placed in this encounter.  No orders of the defined types were placed in this encounter.      Patient ID: Carla Sloan, female   DOB: 07/07/1960, 56 y.o.   MRN: [id number]

## 2016-10-23 NOTE — Progress Notes (Signed)
Presents for AEX.

## 2016-10-24 LAB — CERVICOVAGINAL ANCILLARY ONLY
Bacterial vaginitis: NEGATIVE
CANDIDA VAGINITIS: NEGATIVE

## 2016-10-29 LAB — CYTOLOGY - PAP
Diagnosis: NEGATIVE
HPV (WINDOPATH): NOT DETECTED

## 2017-04-23 ENCOUNTER — Other Ambulatory Visit: Payer: Self-pay | Admitting: Internal Medicine

## 2017-04-23 DIAGNOSIS — Z1231 Encounter for screening mammogram for malignant neoplasm of breast: Secondary | ICD-10-CM

## 2017-06-04 ENCOUNTER — Ambulatory Visit
Admission: RE | Admit: 2017-06-04 | Discharge: 2017-06-04 | Disposition: A | Discharge status: Home / Self Care | Payer: Medicare HMO | Source: Ambulatory Visit | Attending: Internal Medicine | Admitting: Internal Medicine

## 2017-06-04 DIAGNOSIS — Z1231 Encounter for screening mammogram for malignant neoplasm of breast: Secondary | ICD-10-CM

## 2017-09-09 DIAGNOSIS — M25561 Pain in right knee: Secondary | ICD-10-CM | POA: Insufficient documentation

## 2017-09-09 DIAGNOSIS — M25562 Pain in left knee: Secondary | ICD-10-CM | POA: Insufficient documentation

## 2017-12-10 DIAGNOSIS — M1712 Unilateral primary osteoarthritis, left knee: Secondary | ICD-10-CM | POA: Insufficient documentation

## 2017-12-10 DIAGNOSIS — M179 Osteoarthritis of knee, unspecified: Secondary | ICD-10-CM | POA: Insufficient documentation

## 2017-12-21 ENCOUNTER — Other Ambulatory Visit (HOSPITAL_COMMUNITY): Payer: Self-pay | Admitting: Surgery

## 2018-01-11 ENCOUNTER — Ambulatory Visit (HOSPITAL_COMMUNITY)
Admission: RE | Admit: 2018-01-11 | Discharge: 2018-01-11 | Disposition: A | Discharge status: Home / Self Care | Payer: Medicare HMO | Source: Ambulatory Visit | Attending: Surgery | Admitting: Surgery

## 2018-01-11 DIAGNOSIS — K219 Gastro-esophageal reflux disease without esophagitis: Secondary | ICD-10-CM | POA: Insufficient documentation

## 2018-01-26 ENCOUNTER — Encounter: Payer: Self-pay | Admitting: Skilled Nursing Facility1

## 2018-01-26 ENCOUNTER — Encounter: Payer: Medicare HMO | Attending: Surgery | Admitting: Skilled Nursing Facility1

## 2018-01-26 DIAGNOSIS — Z79899 Other long term (current) drug therapy: Secondary | ICD-10-CM | POA: Diagnosis not present

## 2018-01-26 DIAGNOSIS — Z6841 Body Mass Index (BMI) 40.0 and over, adult: Secondary | ICD-10-CM | POA: Insufficient documentation

## 2018-01-26 DIAGNOSIS — I10 Essential (primary) hypertension: Secondary | ICD-10-CM | POA: Insufficient documentation

## 2018-01-26 DIAGNOSIS — Z713 Dietary counseling and surveillance: Secondary | ICD-10-CM | POA: Insufficient documentation

## 2018-01-26 DIAGNOSIS — E669 Obesity, unspecified: Secondary | ICD-10-CM

## 2018-01-26 DIAGNOSIS — F329 Major depressive disorder, single episode, unspecified: Secondary | ICD-10-CM | POA: Diagnosis not present

## 2018-01-26 DIAGNOSIS — E7800 Pure hypercholesterolemia, unspecified: Secondary | ICD-10-CM | POA: Insufficient documentation

## 2018-01-26 DIAGNOSIS — M1990 Unspecified osteoarthritis, unspecified site: Secondary | ICD-10-CM | POA: Insufficient documentation

## 2018-01-26 NOTE — Patient Instructions (Signed)
Write down everything you eat and drink; try to write down how much   Bring the bowl you eat out of next time  Try baked fish   Baked Fish Recipe:  For each fish squeeze half a lemon, dust with garlic powder, dust with onion powder, a pinch of black pepper-wrap in foil and set oven to 350 and cook for 30 minutes   Chew until applesauce consistency  Eat 3 meals a day starting with breakfast 1 hour after you have woke up

## 2018-01-26 NOTE — Progress Notes (Signed)
Pre-Op Assessment Visit:  Pre-Operative Sleeve Gastrectomy Surgery Patient was seen on 01/26/2018 for Pre-Operative Nutrition Assessment. Assessment and letter of approval faxed to [facility] Bariatric Surgery Program coordinator on 01/26/2018.    Pt needs provider to speak slowly and use concise language; pt states she cannot read good.  According to pts referral, surgeon would like pt to work with dietitian for 3-6 months and lose 50 pounds before surgery. Pt states she lives with her son and brother stating her son is on probation and just got out of jail. Pt states he did have an addiction to cocaine but states she is through with that. Pt states her doctor gave her some pills to lose weight and it did not work for her.   Dietitian educated the pt on the financial expendabilities of having bariatric surgery.  Pt does not have Internet access and does not know how to cook.   Pt expectation of surgery: to help me feel better about myself and lose weight  Pt expectation of Dietitian: none  Start weight at [facility]: 411.3 BMI: 68.23  24 hr Dietary Recall: First Meal: skipped fish and grits Snack: candy Second Meal 2-3pm: fish and grits Snack: chips Third Meal: skipped: baked chicken, mac n cheese, corn bread Snack: sweet potato pie Beverages: water, orange juice, fruit punch  Encouraged to engage in 150 minutes of moderate physical activity including cardiovascular and weight baring weekly  Handouts given during visit include:  . Pre-Op Goals . Bariatric Surgery Protein Shakes During the appointment today the following Pre-Op Goals were reviewed with the patient: . Maintain or lose weight as instructed by your surgeon . Make healthy food choices . Begin to limit portion sizes . Limited concentrated sugars and fried foods . Keep fat/sugar in the single digits per serving on             food labels . Practice CHEWING your food  (aim for 30 chews per bite or until  applesauce consistency) . Practice not drinking 15 minutes before, during, and 30 minutes after each meal/snack . Avoid all carbonated beverages  . Avoid/limit caffeinated beverages  . Avoid all sugar-sweetened beverages . Consume 3 meals per day; eat every 3-5 hours . Make a list of non-food related activities . Aim for 64-100 ounces of FLUID daily  . Aim for at least 60-80 grams of PROTEIN daily . Look for a liquid protein source that contain ?15 g protein and ?5 g carbohydrate  (ex: shakes, drinks, shots)  Goals: Write down everything you eat and drink; try to write down how much  Bring the bowl you eat out of next time Try baked fish  Baked Fish Recipe: For each fish squeeze half a lemon, dust with garlic powder, dust with onion powder, a pinch of black pepper-wrap in foil and set oven to 350 and cook for 30 minutes  Chew until applesauce consistency Eat 3 meals a day starting with breakfast 1 hour after you have woke up   -Follow diet recommendations listed below   Energy and Macronutrient Recomendations: Calories: 1500 Carbohydrate: 170 Protein: 112 Fat: 42  Demonstrated degree of understanding via:  Teach Back  Teaching Method Utilized:  Visual Auditory Hands on  Barriers to learning/adherence to lifestyle change: low literacy/financial restraints   Patient to call the [facility] to enroll in Pre-Op and Post-Op Nutrition Education when surgery date is scheduled.

## 2018-03-01 ENCOUNTER — Encounter: Payer: Self-pay | Admitting: Skilled Nursing Facility1

## 2018-03-01 ENCOUNTER — Encounter: Payer: Medicare HMO | Attending: Surgery | Admitting: Skilled Nursing Facility1

## 2018-03-01 DIAGNOSIS — Z713 Dietary counseling and surveillance: Secondary | ICD-10-CM | POA: Diagnosis present

## 2018-03-01 DIAGNOSIS — I10 Essential (primary) hypertension: Secondary | ICD-10-CM | POA: Insufficient documentation

## 2018-03-01 DIAGNOSIS — F329 Major depressive disorder, single episode, unspecified: Secondary | ICD-10-CM | POA: Diagnosis not present

## 2018-03-01 DIAGNOSIS — M1990 Unspecified osteoarthritis, unspecified site: Secondary | ICD-10-CM | POA: Diagnosis not present

## 2018-03-01 DIAGNOSIS — Z6841 Body Mass Index (BMI) 40.0 and over, adult: Secondary | ICD-10-CM | POA: Insufficient documentation

## 2018-03-01 DIAGNOSIS — E669 Obesity, unspecified: Secondary | ICD-10-CM

## 2018-03-01 DIAGNOSIS — Z79899 Other long term (current) drug therapy: Secondary | ICD-10-CM | POA: Insufficient documentation

## 2018-03-01 DIAGNOSIS — E7800 Pure hypercholesterolemia, unspecified: Secondary | ICD-10-CM | POA: Insufficient documentation

## 2018-03-01 NOTE — Patient Instructions (Addendum)
-  Chewing until applesauce consistency   -Continue to not eat fried foods  -Start eating collard greens, turnip greens or cabbage with lunch and dinner 7 days a week  -Try broccoli steamed with cheese; try it at least 5 times before you decide if you like it or not   -Cereal and milk should not come higher than half your bowl  -Limit your [place] D to 1 a week  -Do not eat 2 entrees in one meal

## 2018-03-01 NOTE — Progress Notes (Signed)
Sleeve Assessment:   1st SWL Appointment.   Pt arrives having lost about 2 pounds. Pt needs provider to speak slowly and use concise language; pt states she cannot read good.  According to pts referral, surgeon would like pt to work with dietitian for 3-6 months and lose 50 pounds before surgery. Pt states she lives with her son and brother stating her son is on probation and just got out of jail Pt does not have Internet access and does not know how to cook.   Pt has a list of vitamins her insurance company will cover: Dietitian will review this list closer to surgery with pt to choose appropriate options.   Pt states she tried the baked fish and it came out good stating she no longer eats fried foods. Pt states chewing until applesauce consistency has gone well.  Pt arrives having logged everything she has eaten and drank which helped her pay closer attention to how she eats. Pt also brought the she eats out of stating she only uses one serving spoon full.    Pt is doing really well with following reccomendations and making changes.   Start weight at [facility]: 411.3 Weight: 409.5   MEDICATIONS: See List   DIETARY INTAKE:  24-hr recall:  B ( AM): 3 Pancakes and 8 oz of water Snk ( AM):  L ( PM): ramen noodle and 1 sausage patty or pizza  Snk ( PM):  D ( PM): 2 baked chicken legs and rice 8 oz water or baked spaggetti Snk ( PM): Beverages: water, orange juice, fruit punch   Usual physical activity: treadmill 30-60 minutes 2 times a week   Diet to Follow: 1600 calories 180 g carbohydrates 120 g protein 44 g fat   Nutritional Diagnosis:  [place]-3.3 Overweight/obesity related to past poor dietary habits and physical inactivity as evidenced by patient w/ planned Sleeve Gastrectomy surgery following dietary guidelines for continued weight loss.    Intervention:  Nutrition counseling for upcoming Bariatric Surgery. Goals: -Encouraged to engage in 150 minutes of moderate physical  activity including cardiovascular and weight baring weekly  Teaching Method Utilized:  Visual Auditory Hands on  Barriers to learning/adherence to lifestyle change: none identified   Demonstrated degree of understanding via:  Teach Back   Monitoring/Evaluation:  Dietary intake, exercise,and body weight prn.

## 2018-03-29 ENCOUNTER — Encounter: Payer: Medicare HMO | Attending: Surgery | Admitting: Skilled Nursing Facility1

## 2018-03-29 ENCOUNTER — Encounter: Payer: Self-pay | Admitting: Skilled Nursing Facility1

## 2018-03-29 DIAGNOSIS — Z6841 Body Mass Index (BMI) 40.0 and over, adult: Secondary | ICD-10-CM | POA: Diagnosis not present

## 2018-03-29 DIAGNOSIS — M1990 Unspecified osteoarthritis, unspecified site: Secondary | ICD-10-CM | POA: Insufficient documentation

## 2018-03-29 DIAGNOSIS — F329 Major depressive disorder, single episode, unspecified: Secondary | ICD-10-CM | POA: Diagnosis not present

## 2018-03-29 DIAGNOSIS — E669 Obesity, unspecified: Secondary | ICD-10-CM

## 2018-03-29 DIAGNOSIS — Z713 Dietary counseling and surveillance: Secondary | ICD-10-CM | POA: Insufficient documentation

## 2018-03-29 DIAGNOSIS — I10 Essential (primary) hypertension: Secondary | ICD-10-CM | POA: Insufficient documentation

## 2018-03-29 DIAGNOSIS — E7800 Pure hypercholesterolemia, unspecified: Secondary | ICD-10-CM | POA: Insufficient documentation

## 2018-03-29 DIAGNOSIS — Z79899 Other long term (current) drug therapy: Secondary | ICD-10-CM | POA: Insufficient documentation

## 2018-03-29 NOTE — Progress Notes (Signed)
Sleeve Assessment: 2nd SWL Appointment.   Pt arrives having lost about 2 pounds. Pt needs provider to speak slowly and use concise language; pt states she cannot read good.  According to pts referral, surgeon would like pt to work with dietitian for 3-6 months and lose 50 pounds before surgery. Pt states she lives with her son and brother stating her son is on probation and just got out of jail Pt does not have Internet access and does not know how to cook.   Pt has a list of vitamins her insurance company will cover: Dietitian will review this list closer to surgery with pt to choose appropriate options.   Pt is doing really well with following reccomendations and making changes.   Pt arrives having lost about 3 pounds. Pt states walking she does not get as tired as she used to. Pt does log her food and beverage. Pt states she really does not like broccoli. Pt will try one new vegetable each time she comes.   Start weight at [facility]: 411.3 Weight: 406.1  MEDICATIONS: See List   DIETARY INTAKE:  24-hr recall:  B ( AM): cinnomon crunch cereal  Snk ( AM):  L ( PM): sausage biscuit  Snk ( PM):  D ( PM): turnip greens chicken nuggets  Snk ( PM): Beverages: water, orange juice once a week, 16 oz regular fruit punch once a week  Usual physical activity: treadmill 30-60 minutes 2 times a week   Diet to Follow: 1600 calories 180 g carbohydrates 120 g protein 44 g fat   Nutritional Diagnosis:  [place]-3.3 Overweight/obesity related to past poor dietary habits and physical inactivity as evidenced by patient w/ planned Sleeve Gastrectomy surgery following dietary guidelines for continued weight loss.    Intervention:  Nutrition counseling for upcoming Bariatric Surgery. Goals: -Encouraged to engage in 150 minutes of moderate physical activity including cardiovascular and weight baring weekly -Aim to go to the gym 3 days a week for 30 minutes -Try chicken or egg salad on top of a bag of  salad  OR try your ramen noodle with the bag of salad  -Try to limit your orange juice and fruit punch to 8 ounces a week -Do not drink any soda Teaching Method Utilized:  Visual Auditory Hands on  Barriers to learning/adherence to lifestyle change: none identified   Demonstrated degree of understanding via:  Teach Back   Monitoring/Evaluation:  Dietary intake, exercise,and body weight prn.

## 2018-03-29 NOTE — Patient Instructions (Addendum)
-  Aim to go to the gym 3 days a week for 30 minutes  -Try chicken or egg salad on top of a bag of salad  OR try your ramen noodle with the bag of salad   -Try to limit your orange juice and fruit punch to 8 ounces a week  -Do not drink any soda

## 2018-04-08 ENCOUNTER — Other Ambulatory Visit: Payer: Self-pay | Admitting: Internal Medicine

## 2018-04-08 DIAGNOSIS — Z1231 Encounter for screening mammogram for malignant neoplasm of breast: Secondary | ICD-10-CM

## 2018-04-27 ENCOUNTER — Encounter: Payer: Medicare HMO | Attending: Surgery | Admitting: Skilled Nursing Facility1

## 2018-04-27 DIAGNOSIS — Z713 Dietary counseling and surveillance: Secondary | ICD-10-CM | POA: Diagnosis present

## 2018-04-27 DIAGNOSIS — I10 Essential (primary) hypertension: Secondary | ICD-10-CM | POA: Insufficient documentation

## 2018-04-27 DIAGNOSIS — M1990 Unspecified osteoarthritis, unspecified site: Secondary | ICD-10-CM | POA: Diagnosis not present

## 2018-04-27 DIAGNOSIS — F329 Major depressive disorder, single episode, unspecified: Secondary | ICD-10-CM | POA: Diagnosis not present

## 2018-04-27 DIAGNOSIS — E7800 Pure hypercholesterolemia, unspecified: Secondary | ICD-10-CM | POA: Diagnosis not present

## 2018-04-27 DIAGNOSIS — Z79899 Other long term (current) drug therapy: Secondary | ICD-10-CM | POA: Diagnosis not present

## 2018-04-27 DIAGNOSIS — E669 Obesity, unspecified: Secondary | ICD-10-CM

## 2018-04-27 DIAGNOSIS — Z6841 Body Mass Index (BMI) 40.0 and over, adult: Secondary | ICD-10-CM | POA: Diagnosis not present

## 2018-04-27 NOTE — Patient Instructions (Addendum)
-  Get into the gym 5 days a week   -Every time you eat lunch have vegetables with it  -Track how much water you drink   -Continue to not drink soda  -Try cooked carrots
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
## 2018-05-11 ENCOUNTER — Ambulatory Visit: Payer: Medicare HMO | Admitting: Psychiatry

## 2018-05-18 ENCOUNTER — Ambulatory Visit: Payer: Medicare HMO | Admitting: Psychiatry

## 2018-05-27 ENCOUNTER — Other Ambulatory Visit: Payer: Self-pay

## 2018-05-27 ENCOUNTER — Encounter: Payer: Medicare HMO | Attending: Surgery | Admitting: Skilled Nursing Facility1

## 2018-05-27 DIAGNOSIS — F329 Major depressive disorder, single episode, unspecified: Secondary | ICD-10-CM | POA: Insufficient documentation

## 2018-05-27 DIAGNOSIS — Z6841 Body Mass Index (BMI) 40.0 and over, adult: Secondary | ICD-10-CM | POA: Insufficient documentation

## 2018-05-27 DIAGNOSIS — E669 Obesity, unspecified: Secondary | ICD-10-CM

## 2018-05-27 DIAGNOSIS — M1990 Unspecified osteoarthritis, unspecified site: Secondary | ICD-10-CM | POA: Diagnosis not present

## 2018-05-27 DIAGNOSIS — Z79899 Other long term (current) drug therapy: Secondary | ICD-10-CM | POA: Insufficient documentation

## 2018-05-27 DIAGNOSIS — I10 Essential (primary) hypertension: Secondary | ICD-10-CM | POA: Insufficient documentation

## 2018-05-27 DIAGNOSIS — Z713 Dietary counseling and surveillance: Secondary | ICD-10-CM | POA: Diagnosis not present

## 2018-05-27 DIAGNOSIS — E7800 Pure hypercholesterolemia, unspecified: Secondary | ICD-10-CM | POA: Diagnosis not present

## 2018-05-27 NOTE — Patient Instructions (Addendum)
-  Stand up and march and pump your arms for every commercial: do this 7 days a week  -Down size your portions: 1 spoonful
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
## 2018-05-31 ENCOUNTER — Ambulatory Visit: Payer: Medicare HMO | Admitting: Psychology

## 2018-06-07 ENCOUNTER — Ambulatory Visit: Payer: Self-pay

## 2018-06-30 ENCOUNTER — Encounter: Payer: Medicare HMO | Attending: Surgery | Admitting: Skilled Nursing Facility1

## 2018-06-30 ENCOUNTER — Other Ambulatory Visit: Payer: Self-pay

## 2018-06-30 DIAGNOSIS — Z79899 Other long term (current) drug therapy: Secondary | ICD-10-CM | POA: Insufficient documentation

## 2018-06-30 DIAGNOSIS — M1990 Unspecified osteoarthritis, unspecified site: Secondary | ICD-10-CM | POA: Diagnosis not present

## 2018-06-30 DIAGNOSIS — E7800 Pure hypercholesterolemia, unspecified: Secondary | ICD-10-CM | POA: Insufficient documentation

## 2018-06-30 DIAGNOSIS — F329 Major depressive disorder, single episode, unspecified: Secondary | ICD-10-CM | POA: Insufficient documentation

## 2018-06-30 DIAGNOSIS — Z713 Dietary counseling and surveillance: Secondary | ICD-10-CM | POA: Insufficient documentation

## 2018-06-30 DIAGNOSIS — E669 Obesity, unspecified: Secondary | ICD-10-CM

## 2018-06-30 DIAGNOSIS — I10 Essential (primary) hypertension: Secondary | ICD-10-CM | POA: Diagnosis not present

## 2018-06-30 DIAGNOSIS — Z6841 Body Mass Index (BMI) 40.0 and over, adult: Secondary | ICD-10-CM | POA: Insufficient documentation

## 2018-06-30 NOTE — Progress Notes (Signed)
Sleeve Assessment: 4th SWL Appointment.    Pt needs provider to speak slowly and use concise language; pt states she cannot read good.  According to pts referral, surgeon would like pt to work with dietitian for 3-6 months and lose 50 pounds before surgery. Pt states she lives with her son and brother stating her son is on probation and just got out of jail Pt does not have Internet access and does not know how to cook.   Pt has a list of vitamins her insurance company will cover: Dietitian will review this list closer to surgery with pt to choose appropriate options.   Pt is doing really well with following reccomendations and making changes.   Pt states walking she does not get as tired as she used to.   Pt states she tried cooked carrot and she liked them so now she eats lettuce, greens, cucumbers, cabbage and carrots. .   Pt arrives having lost about 3 pounds. Pt states she struggles with marching in place due to balance but has been doing chair exercises. Pt states she has been sticking with 1 spoonful of food stating she has been hungry but has not given in by drinking water.  Pt states she is realizing she was filling her time with food. Pt states she really misses the gym.   Start weight at [facility]: 411.3 Weight: 406 BMI: 67.35  MEDICATIONS: See List   DIETARY INTAKE:  24-hr recall:  B ( 5AM): grits and sausage  Snk ( AM): apple or other fruit  L ( PM): spam sandwich with cabbage  Snk ( PM):  D ( PM): turnip greens and chicken and rice  Snk ( PM): Beverages: water, 8 oz orange juice once a week (has not in 2 weeks), 8 oz regular fruit punch once a week   Usual physical activity: Chair exercises 2 times a day 7 days a week  Diet to Follow: 1600 calories 180 g carbohydrates 120 g protein 44 g fat   Nutritional Diagnosis:  [place]-3.3 Overweight/obesity related to past poor dietary habits and physical inactivity as evidenced by patient w/ planned Sleeve Gastrectomy surgery  following dietary guidelines for continued weight loss.    Intervention:  Nutrition counseling for upcoming Bariatric Surgery. Goals: -Encouraged to engage in 150 minutes of moderate physical activity including cardiovascular and weight baring weekly -Continue to do chair exercises 2 times a day 7 days a week -Continue to Every time you eat lunch have vegetables with it -Continue to eat 1 spoonful of starch  -Eat cucumbers  -Instead of spending your time thinking about food do these things instead   -Say a prayer   -Call a friend   -Do a workout video   Teaching Method Utilized:  Visual Auditory Hands on  Barriers to learning/adherence to lifestyle change: none identified   Demonstrated degree of understanding via:  Teach Back   Monitoring/Evaluation:  Dietary intake, exercise,and body weight prn.

## 2018-06-30 NOTE — Patient Instructions (Addendum)
-  Continue to do chair exercises 2 times a day 7 days a week  -Continue to Every time you eat lunch have vegetables with it  -Continue to eat 1 spoonful of starch   -Eat cucumbers   -Instead of spending your time thinking about food do these things instead    -Say a prayer   -Call a friend   -Do a workout video

## 2018-07-13 ENCOUNTER — Other Ambulatory Visit: Payer: Self-pay

## 2018-07-13 ENCOUNTER — Ambulatory Visit
Admission: RE | Admit: 2018-07-13 | Discharge: 2018-07-13 | Disposition: A | Discharge status: Home / Self Care | Payer: Medicare HMO | Source: Ambulatory Visit | Attending: Internal Medicine | Admitting: Internal Medicine

## 2018-07-13 DIAGNOSIS — Z1231 Encounter for screening mammogram for malignant neoplasm of breast: Secondary | ICD-10-CM

## 2018-07-28 ENCOUNTER — Encounter: Payer: Medicare HMO | Attending: Surgery | Admitting: Skilled Nursing Facility1

## 2018-07-28 ENCOUNTER — Other Ambulatory Visit: Payer: Self-pay

## 2018-07-28 DIAGNOSIS — M1990 Unspecified osteoarthritis, unspecified site: Secondary | ICD-10-CM | POA: Diagnosis not present

## 2018-07-28 DIAGNOSIS — Z6841 Body Mass Index (BMI) 40.0 and over, adult: Secondary | ICD-10-CM | POA: Insufficient documentation

## 2018-07-28 DIAGNOSIS — I10 Essential (primary) hypertension: Secondary | ICD-10-CM | POA: Diagnosis not present

## 2018-07-28 DIAGNOSIS — E669 Obesity, unspecified: Secondary | ICD-10-CM

## 2018-07-28 DIAGNOSIS — Z79899 Other long term (current) drug therapy: Secondary | ICD-10-CM | POA: Insufficient documentation

## 2018-07-28 DIAGNOSIS — F329 Major depressive disorder, single episode, unspecified: Secondary | ICD-10-CM | POA: Diagnosis not present

## 2018-07-28 DIAGNOSIS — E7800 Pure hypercholesterolemia, unspecified: Secondary | ICD-10-CM | POA: Insufficient documentation

## 2018-07-28 DIAGNOSIS — Z713 Dietary counseling and surveillance: Secondary | ICD-10-CM | POA: Diagnosis present

## 2018-07-28 NOTE — Progress Notes (Signed)
Sleeve Assessment: 5th SWL Appointment.    Pt needs provider to speak slowly and use concise language; pt states she cannot read good.  According to pts referral, surgeon would like pt to work with dietitian for 3-6 months and lose 50 pounds before surgery. Pt states she lives with her son and brother stating her son is on probation and just got out of jail Pt does not have Internet access and does not know how to cook.   Pt has a list of vitamins her insurance company will cover: Dietitian will review this list closer to surgery with pt to choose appropriate options.   Pt is doing really well with following reccomendations and making changes.   Pt states walking she does not get as tired as she used to.   Pt states she tried cooked carrot and she liked them so now she eats lettuce, greens, cucumbers, cabbage and carrots.   Pt is continuing to do well. Pt arrives having gained about 3 pounds. [facility] so she will [facility] that. Pt states he does not feel like the arm chair exercise are helping: dietitian encouraged the pt to continue it because it is doing a lot for her health. Pt states she continues to log her food and drink. Pt states her nurse with her insurance company is going to send her a pedometer so she is excited about that.   Start weight at [facility]: 411.3 Weight: 409 BMI: 67.89  MEDICATIONS: See List   DIETARY INTAKE:  24-hr recall:  B ( 5AM): grits and sausage  Snk ( AM): apple or other fruit  L ( PM): spam sandwich with cabbage  Snk ( PM):  D ( PM): turnip greens and chicken and rice  Snk ( PM): Beverages: water   Usual physical activity: Chair exercises 2 times a day 7 days a week; excited to start the gym back  Diet to Follow: 1600 calories 180 g carbohydrates 120 g protein 44 g fat   Nutritional Diagnosis:  [place]-3.3 Overweight/obesity related to past poor dietary habits and physical inactivity as evidenced by patient w/ planned Sleeve  Gastrectomy surgery following dietary guidelines for continued weight loss.    Intervention:  Nutrition counseling for upcoming Bariatric Surgery. Goals: -Encouraged to engage in 150 minutes of moderate physical activity including cardiovascular and weight baring weekly -Continue to do chair exercises 2 times a day 7 days a week -Continue to Every time you eat lunch have vegetables with it -Continue to eat 1 spoonful of starch  -Eat cucumbers  -Continue to: Instead of spending your time thinking about food do these things instead   -Say a prayer   -Call a friend Bring your vitamins to the next appt to review   Teaching Method Utilized:  Visual Auditory Hands on  Barriers to learning/adherence to lifestyle change: none identified   Demonstrated degree of understanding via:  Teach Back   Monitoring/Evaluation:  Dietary intake, exercise,and body weight prn.

## 2018-08-31 ENCOUNTER — Other Ambulatory Visit: Payer: Self-pay

## 2018-08-31 ENCOUNTER — Encounter: Payer: Medicare HMO | Attending: Surgery | Admitting: Skilled Nursing Facility1

## 2018-08-31 DIAGNOSIS — E7800 Pure hypercholesterolemia, unspecified: Secondary | ICD-10-CM | POA: Diagnosis not present

## 2018-08-31 DIAGNOSIS — I10 Essential (primary) hypertension: Secondary | ICD-10-CM | POA: Insufficient documentation

## 2018-08-31 DIAGNOSIS — M1990 Unspecified osteoarthritis, unspecified site: Secondary | ICD-10-CM | POA: Diagnosis not present

## 2018-08-31 DIAGNOSIS — Z79899 Other long term (current) drug therapy: Secondary | ICD-10-CM | POA: Insufficient documentation

## 2018-08-31 DIAGNOSIS — F329 Major depressive disorder, single episode, unspecified: Secondary | ICD-10-CM | POA: Diagnosis not present

## 2018-08-31 DIAGNOSIS — Z713 Dietary counseling and surveillance: Secondary | ICD-10-CM | POA: Insufficient documentation

## 2018-08-31 DIAGNOSIS — E669 Obesity, unspecified: Secondary | ICD-10-CM

## 2018-08-31 DIAGNOSIS — Z6841 Body Mass Index (BMI) 40.0 and over, adult: Secondary | ICD-10-CM | POA: Insufficient documentation

## 2018-08-31 NOTE — Progress Notes (Signed)
Sleeve Assessment: 6th SWL Appointment.    Pt needs provider to speak slowly and use concise language; pt states she cannot read good.  According to pts referral, surgeon would like pt to work with dietitian for 3-6 months and lose 50 pounds before surgery. Pt states she lives with her son and brother stating her son is on probation and just got out of jail Pt does not have Internet access and does not know how to cook.   Pt has a list of vitamins her insurance company will cover: Dietitian will review this list closer to surgery with pt to choose appropriate options.    Pt arrives having gained about 2 pounds. Pt states she has been doing her arm chair exercises 2-3 times a day 7 days a week. Pt states she still writes her food and drink down but does not know why she has gained 2 pounds. Pt did not bring her log book with her. Pt states she was eating 2 packets of ramen at once but has cut that back to 1 for the last 3 weeks. Pt states she has not bought goodies since 2 months ago. Pt states she does not eat in the middle of the night. Pt states she only eats out about 1 time a month. [facility] but there has been some miscommunication on there part so she has not been back.  Pt will come back in 2 weeks to review her food log.   Pt still needs to lose 50 pounds.  Start weight at [facility]: 411.3 Weight: 411 BMI: 67.89  MEDICATIONS: See List   DIETARY INTAKE:  24-hr recall: wakes at 3-4am B ( 4AM): 2 packets of butter grits and 2 patties sausage or cereal (high sugar)  Snk ( AM): spam sandwich with mayo and onion  L ( 11 AM):1 pack of ramen noodles with lettuce  Snk ( PM): 2 bananas  D ( PM): 2 BBQ chicken legs, mixed greens in a can, white rice with 2 serving spoons of butter Snk ( PM): Beverages: water, sweet tea (staing this is once a year)   Usual physical activity: Chair exercises 2 times a day 7 days a week; excited to start the gym back  Diet to  Follow: 1600 calories 180 g carbohydrates 120 g protein 44 g fat   Nutritional Diagnosis:  [place]-3.3 Overweight/obesity related to past poor dietary habits and physical inactivity as evidenced by patient w/ planned Sleeve Gastrectomy surgery following dietary guidelines for continued weight loss.    Intervention:  Nutrition counseling for upcoming Bariatric Surgery. Goals: For 1 snack:  1 banana OR 17 grapes OR 1 orange OR 1 Apple OR 1 bag of peanuts  A spam sandwich is a meal not a snack  Only eat 1 serving spoon of rice per meal  Only use 1 spoon of butter for anything  Do not buy fat back; do not use fat back  Bring your log book with you 2 crackers per DAY for graham crackers   Teaching Method Utilized:  Visual Auditory Hands on  Barriers to learning/adherence to lifestyle change: none identified   Demonstrated degree of understanding via:  Teach Back   Monitoring/Evaluation:  Dietary intake, exercise,and body weight prn.

## 2018-08-31 NOTE — Patient Instructions (Addendum)
For 1 snack:   1 banana OR 17 grapes OR 1 orange OR 1 Apple OR 1 bag of peanuts    A spam sandwich is a meal not a snack   Only eat 1 serving spoon of rice per meal   Only use 1 spoon of butter for anything   Do not buy fat back; do not use fat back   Bring your log book with you  2 crackers per DAY for graham crackers

## 2018-09-02 ENCOUNTER — Encounter: Payer: Self-pay | Admitting: Obstetrics

## 2018-09-02 ENCOUNTER — Ambulatory Visit (INDEPENDENT_AMBULATORY_CARE_PROVIDER_SITE_OTHER): Payer: Medicare HMO | Admitting: Obstetrics

## 2018-09-02 ENCOUNTER — Other Ambulatory Visit (HOSPITAL_COMMUNITY)
Admission: RE | Admit: 2018-09-02 | Discharge: 2018-09-02 | Disposition: A | Discharge status: Home / Self Care | Payer: Medicare HMO | Source: Ambulatory Visit | Attending: Obstetrics | Admitting: Obstetrics

## 2018-09-02 ENCOUNTER — Other Ambulatory Visit: Payer: Self-pay

## 2018-09-02 VITALS — BP 125/91 | HR 69 | Ht 66.0 in | Wt >= 6400 oz

## 2018-09-02 DIAGNOSIS — N898 Other specified noninflammatory disorders of vagina: Secondary | ICD-10-CM | POA: Diagnosis present

## 2018-09-02 DIAGNOSIS — Z01419 Encounter for gynecological examination (general) (routine) without abnormal findings: Secondary | ICD-10-CM | POA: Diagnosis not present

## 2018-09-02 DIAGNOSIS — Z780 Asymptomatic menopausal state: Secondary | ICD-10-CM

## 2018-09-02 NOTE — Progress Notes (Signed)
Subjective:        Carla Sloan is a 58 y.o. female here for a routine exam.  Current complaints: None.    Personal health questionnaire:  Is patient [name], have a family history of breast and/or ovarian cancer: no Is there a family history of uterine cancer diagnosed at age < 57, gastrointestinal cancer, urinary tract cancer, family member who is a [name]: no Is the patient overweight and hypertensive, family history of diabetes, personal history of gestational diabetes, preeclampsia or PCOS: yes Is patient over 36, have PCOS,  family history of premature CHD under age 38, diabetes, smoke, have hypertension or peripheral artery disease:  yes At any time, has a partner hit, kicked or otherwise hurt or frightened you?: no Over the past 2 weeks, have you felt down, depressed or hopeless?: no Over the past 2 weeks, have you felt little interest or pleasure in doing things?:no   Gynecologic History No LMP recorded. (Menstrual status: Perimenopausal). Contraception: post menopausal status Last Pap: 10-23-2016. Results were: normal Last mammogram: 06-04-2017. Results were: normal  Obstetric History OB History  Gravida Para Term Preterm AB Living  2 2 2     2   SAB TAB Ectopic Multiple Live Births          2    # Outcome Date GA Lbr Len/2nd Weight Sex Delivery Anes PTL Lv  2 Term 12/09/93 [redacted]w[redacted]d  8 lb 6 oz (3.799 kg) M Vag-Spont EPI  LIV  1 Term 10/26/90 [redacted]w[redacted]d  6 lb 6 oz (2.892 kg) F Vag-Spont EPI  LIV    Past Medical History:  Diagnosis Date  . Arthritis   . Depression   . Hypertension   . Muscle spasms of lower extremity    and back    Past Surgical History:  Procedure Laterality Date  . COLONOSCOPY WITH PROPOFOL N/A 09/24/2012   Procedure: COLONOSCOPY WITH PROPOFOL;  Surgeon: Jeryl Columbia, MD;  Location: [facility] ENDOSCOPY;  Service: Endoscopy;  Laterality: N/A;  . COLONOSCOPY WITH PROPOFOL N/A 05/19/2016   Procedure: COLONOSCOPY  WITH PROPOFOL;  Surgeon: Clarene Essex, MD;  Location: [facility] ENDOSCOPY;  Service: Endoscopy;  Laterality: N/A;  . NO PAST SURGERIES       Current Outpatient Medications:  .  cholecalciferol (VITAMIN D3) 25 MCG (1000 UT) tablet, Take 1,000 Units by mouth daily., Disp: , Rfl:  .  citalopram (CELEXA) 40 MG tablet, Take 40 mg by mouth at bedtime., Disp: , Rfl:  .  cyclobenzaprine (FLEXERIL) 5 MG tablet, , Disp: , Rfl:  .  gabapentin (NEURONTIN) 400 MG capsule, Take 400-800 mg by mouth 2 (two) times daily. 1 cap in the morning and 2 cap at night, Disp: , Rfl:  .  hydrochlorothiazide (HYDRODIURIL) 25 MG tablet, Take 25 mg by mouth every morning. , Disp: , Rfl:  .  Lurasidone HCl (LATUDA) 120 MG TABS, Take 120 mg by mouth at bedtime., Disp: , Rfl:  .  diphenhydrAMINE (BENADRYL) 25 MG tablet, Take 25 mg by mouth every 6 (six) hours as needed for itching or allergies., Disp: , Rfl:  .  famotidine (PEPCID) 20 MG tablet, Take 20 mg by mouth 2 (two) times daily., Disp: , Rfl:  .  verapamil (VERELAN PM) 360 MG 24 hr capsule, Take 360 mg by mouth at bedtime., Disp: , Rfl:  No Known Allergies  Social History   Tobacco Use  . Smoking status: Never Smoker  . Smokeless tobacco: Never Used  Substance Use  Topics  . Alcohol use: No    Family History  Problem Relation Age of Onset  . Hypertension Mother   . Diabetes Mother   . Breast cancer Mother   . Diabetes Father   . Hypertension Father       Review of Systems  Constitutional: negative for fatigue and weight loss Respiratory: negative for cough and wheezing Cardiovascular: negative for chest pain, fatigue and palpitations Gastrointestinal: negative for abdominal pain and change in bowel habits Musculoskeletal:negative for myalgias Neurological: negative for gait problems and tremors Behavioral/Psych: negative for abusive relationship, depression Endocrine: negative for temperature intolerance    Genitourinary:negative for abnormal menstrual  periods, genital lesions, hot flashes, sexual problems and vaginal discharge Integument/breast: negative for breast lump, breast tenderness, nipple discharge and skin lesion(s)    Objective:       BP (!) 125/91   Pulse 69   Ht 5\' 6"  (1.676 m)   Wt (!) 411 lb (186.4 kg)   BMI 66.34 kg/m  General:   alert  Skin:   no rash or abnormalities  Lungs:   clear to auscultation bilaterally  Heart:   regular rate and rhythm, S1, S2 normal, no murmur, click, rub or gallop  Breasts:   normal without suspicious masses, skin or nipple changes or axillary nodes  Abdomen:  normal findings: no organomegaly, soft, non-tender and no hernia  Pelvis:  External genitalia: normal general appearance Urinary system: urethral meatus normal and bladder without fullness, nontender Vaginal: normal without tenderness, induration or masses Cervix: normal appearance Adnexa: normal bimanual exam Uterus: anteverted and non-tender, normal size   Lab Review Urine pregnancy test Labs reviewed yes Radiologic studies reviewed yes  50% of 25 min visit spent on counseling and coordination of care.   Assessment:     1. Encounter for routine gynecological examination with Papanicolaou smear of cervix Rx: - Cytology - PAP( [place])  2. Asymptomatic menopause  3. Morbid obesity ([place]) - program of caloric reduction, exercise and behavioral modification recommended  4. Vaginal discharge Rx: - Cervicovaginal ancillary only( [place])   Plan:    Education reviewed: calcium supplements, depression evaluation, low fat, low cholesterol diet, safe sex/STD prevention, self breast exams and weight bearing exercise. Contraception: post menopausal status. Follow up in: 1 year.   No orders of the defined types were placed in this encounter.  No orders of the defined types were placed in this encounter.   Shelly Bombard MD 09-02-2018

## 2018-09-03 LAB — CERVICOVAGINAL ANCILLARY ONLY
Bacterial vaginitis: POSITIVE — AB
Candida vaginitis: NEGATIVE
Chlamydia: NEGATIVE
Neisseria Gonorrhea: NEGATIVE
Trichomonas: POSITIVE — AB

## 2018-09-06 ENCOUNTER — Other Ambulatory Visit: Payer: Self-pay | Admitting: Obstetrics

## 2018-09-06 DIAGNOSIS — N760 Acute vaginitis: Secondary | ICD-10-CM

## 2018-09-06 DIAGNOSIS — B9689 Other specified bacterial agents as the cause of diseases classified elsewhere: Secondary | ICD-10-CM

## 2018-09-06 DIAGNOSIS — A599 Trichomoniasis, unspecified: Secondary | ICD-10-CM

## 2018-09-06 LAB — CYTOLOGY - PAP
Diagnosis: NEGATIVE
HPV: NOT DETECTED

## 2018-09-06 MED ORDER — TINIDAZOLE 500 MG PO TABS: 2.0000 g | ORAL_TABLET | Freq: Every day | ORAL | 0 refills | Status: DC

## 2018-09-07 ENCOUNTER — Other Ambulatory Visit: Payer: Self-pay

## 2018-09-07 ENCOUNTER — Telehealth: Payer: Self-pay

## 2018-09-07 DIAGNOSIS — N760 Acute vaginitis: Secondary | ICD-10-CM

## 2018-09-07 DIAGNOSIS — A599 Trichomoniasis, unspecified: Secondary | ICD-10-CM

## 2018-09-07 DIAGNOSIS — B9689 Other specified bacterial agents as the cause of diseases classified elsewhere: Secondary | ICD-10-CM

## 2018-09-07 MED ORDER — TINIDAZOLE 500 MG PO TABS: 2.0000 g | ORAL_TABLET | Freq: Every day | ORAL | 0 refills | Status: DC

## 2018-09-07 NOTE — Telephone Encounter (Signed)
-----   Message from Shelly Bombard, MD sent at 09/06/2018 12:50 PM EDT ----- [facility] Rx for BV and Trichomonas

## 2018-09-07 NOTE — Telephone Encounter (Signed)
  Patient notified of results and RX.  Reorder Rx to the correct Pharmacy per patient request.  [facility].

## 2018-09-07 NOTE — Progress Notes (Signed)
Rx sent to correct Pharmacy.  [facility]. Per patient request.

## 2018-09-21 ENCOUNTER — Encounter: Payer: Medicare HMO | Admitting: Skilled Nursing Facility1

## 2018-09-21 ENCOUNTER — Other Ambulatory Visit: Payer: Self-pay

## 2018-09-21 DIAGNOSIS — Z713 Dietary counseling and surveillance: Secondary | ICD-10-CM | POA: Diagnosis not present

## 2018-09-21 DIAGNOSIS — E669 Obesity, unspecified: Secondary | ICD-10-CM

## 2018-09-21 NOTE — Progress Notes (Signed)
Sleeve Assessment: 7th SWL Appointment.    Pt needs provider to speak slowly and use concise language; pt states she cannot read good.  According to pts referral, surgeon would like pt to work with dietitian for 3-6 months and lose 50 pounds before surgery. Pt states she lives with her son and brother stating her son is on probation and just got out of jail Pt does not have Internet access and does not know how to cook.   Pt has a list of vitamins her insurance company will cover: Dietitian will review this list closer to surgery with pt to choose appropriate options.    Pt is doing really well with her changes.  Pt states she bought measuring cups. Pt states her doctor started her on phentermine.   Pt still needs to lose 50 pounds.  Start weight at [facility]: 411.3 Weight: 410 BMI: 68.02  MEDICATIONS: See List   DIETARY INTAKE:  24-hr recall: wakes at 3-4am B ( 4AM): 2 packets of butter grits and 4 sausage or cereal (high sugar)  Snk ( AM): 17 grapes L ( 11 AM):spam sandwich Snk ( PM): 2 bananas  D ( PM): 2 BBQ chicken legs, mixed greens in a can, white rice with 1 eating spoon of butter Snk ( PM): Beverages: 64oz water, sweet tea (staing this is once a year)   Usual physical activity: Chair exercises 3 times a day 7 days a week; 80 times each  Diet to Follow: 1600 calories 180 g carbohydrates 120 g protein 44 g fat   Nutritional Diagnosis:  [place]-3.3 Overweight/obesity related to past poor dietary habits and physical inactivity as evidenced by patient w/ planned Sleeve Gastrectomy surgery following dietary guidelines for continued weight loss.    Intervention:  Nutrition counseling for upcoming Bariatric Surgery. Goals: Rice=half cup Have carrots with your spam sandwich Walk to [employer] or around the gym get up early and bring water with you; every other day Fill up your bottle 5 times a day of water  Teaching Method Utilized:  Visual Auditory Hands on  Barriers to  learning/adherence to lifestyle change: none identified   Demonstrated degree of understanding via:  Teach Back   Monitoring/Evaluation:  Dietary intake, exercise,and body weight prn.

## 2018-10-05 ENCOUNTER — Other Ambulatory Visit: Payer: Self-pay

## 2018-10-05 ENCOUNTER — Encounter: Payer: Medicare HMO | Attending: Surgery | Admitting: Skilled Nursing Facility1

## 2018-10-05 DIAGNOSIS — I10 Essential (primary) hypertension: Secondary | ICD-10-CM | POA: Insufficient documentation

## 2018-10-05 DIAGNOSIS — Z713 Dietary counseling and surveillance: Secondary | ICD-10-CM | POA: Diagnosis not present

## 2018-10-05 DIAGNOSIS — M1990 Unspecified osteoarthritis, unspecified site: Secondary | ICD-10-CM | POA: Insufficient documentation

## 2018-10-05 DIAGNOSIS — Z6841 Body Mass Index (BMI) 40.0 and over, adult: Secondary | ICD-10-CM | POA: Insufficient documentation

## 2018-10-05 DIAGNOSIS — F329 Major depressive disorder, single episode, unspecified: Secondary | ICD-10-CM | POA: Insufficient documentation

## 2018-10-05 DIAGNOSIS — E7800 Pure hypercholesterolemia, unspecified: Secondary | ICD-10-CM | POA: Insufficient documentation

## 2018-10-05 DIAGNOSIS — E669 Obesity, unspecified: Secondary | ICD-10-CM

## 2018-10-05 DIAGNOSIS — Z79899 Other long term (current) drug therapy: Secondary | ICD-10-CM | POA: Insufficient documentation

## 2018-10-05 NOTE — Progress Notes (Signed)
Sleeve Assessment: 8th SWL Appointment.    Pt needs provider to speak slowly and use concise language; pt states she cannot read good.  According to pts referral, surgeon would like pt to work with dietitian for 3-6 months and lose 50 pounds before surgery. Pt states she lives with her son and brother stating her son is on probation and just got out of jail Pt does not have Internet access and does not know how to cook.   Pt has a list of vitamins her insurance company will cover: Dietitian will review this list closer to surgery with pt to choose appropriate options.    Pt is doing really well with her changes.  Pt states she bought measuring cups. Pt states her doctor started her on phentermine.   Pt arrives having lost about 8 pounds from previous visit. Pt states she took the juice from her chicken and put it in her rice instead of butter. Pt states she did have carrots with her spam sandwich.   Pt still needs to lose 42 pounds.  Start weight at [facility]: 411.3 Weight: 402.5 BMI: 66.77  MEDICATIONS: See List   DIETARY INTAKE:  24-hr recall: wakes at 3-4am B ( 4AM): 2 packets of butter grits and 4 sausage or cereal (high sugar)  Snk ( AM): 17 grapes L ( 11 AM):spam sandwich Snk ( PM): 2 bananas  D ( PM): 2 BBQ chicken legs, mixed greens in a can, white rice with 1 eating spoon of butter Snk ( PM): Beverages: 80oz water, sweet tea (staing this is once a year)   Usual physical activity: Chair exercises 3 times a day 7 days a week; 80 times each  Diet to Follow: 1600 calories 180 g carbohydrates 120 g protein 44 g fat   Nutritional Diagnosis:  [place]-3.3 Overweight/obesity related to past poor dietary habits and physical inactivity as evidenced by patient w/ planned Sleeve Gastrectomy surgery following dietary guidelines for continued weight loss.    Intervention:  Nutrition counseling for upcoming Bariatric Surgery. Goals: -Drink a minimum of 3 of your cups a day, 7 days a  week -Continue to limit your rice to half a cup -Continue to use less butter -Continue to have carrots with your sandwiches   Teaching Method Utilized:  Visual Auditory Hands on  Barriers to learning/adherence to lifestyle change: none identified   Demonstrated degree of understanding via:  Teach Back   Monitoring/Evaluation:  Dietary intake, exercise,and body weight prn.

## 2018-10-05 NOTE — Patient Instructions (Addendum)
-  Drink a minimum of 3 of your cups a day, 7 days a week  -Continue to limit your rice to half a cup  -Continue to use less butter  -Continue to have carrots with your sandwiches   -Always add vegetables to every lunch and every dinner

## 2018-11-03 ENCOUNTER — Other Ambulatory Visit: Payer: Self-pay

## 2018-11-03 ENCOUNTER — Encounter: Payer: Medicare HMO | Attending: Surgery | Admitting: Skilled Nursing Facility1

## 2018-11-03 DIAGNOSIS — F329 Major depressive disorder, single episode, unspecified: Secondary | ICD-10-CM | POA: Diagnosis not present

## 2018-11-03 DIAGNOSIS — Z713 Dietary counseling and surveillance: Secondary | ICD-10-CM | POA: Insufficient documentation

## 2018-11-03 DIAGNOSIS — E669 Obesity, unspecified: Secondary | ICD-10-CM

## 2018-11-03 DIAGNOSIS — Z6841 Body Mass Index (BMI) 40.0 and over, adult: Secondary | ICD-10-CM | POA: Diagnosis not present

## 2018-11-03 DIAGNOSIS — I10 Essential (primary) hypertension: Secondary | ICD-10-CM | POA: Insufficient documentation

## 2018-11-03 DIAGNOSIS — E7800 Pure hypercholesterolemia, unspecified: Secondary | ICD-10-CM | POA: Insufficient documentation

## 2018-11-03 DIAGNOSIS — M1990 Unspecified osteoarthritis, unspecified site: Secondary | ICD-10-CM | POA: Diagnosis not present

## 2018-11-03 DIAGNOSIS — Z79899 Other long term (current) drug therapy: Secondary | ICD-10-CM | POA: Diagnosis not present

## 2018-11-03 NOTE — Progress Notes (Signed)
Sleeve Assessment: 8th SWL Appointment.    Pt needs provider to speak slowly and use concise language; pt states she cannot read good.  According to pts referral, surgeon would like pt to work with dietitian for 3-6 months and lose 50 pounds before surgery. Pt states she lives with her son and brother stating her son is on probation and just got out of jail Pt does not have Internet access and does not know how to cook.   Pt has a list of vitamins her insurance company will cover: Dietitian will review this list closer to surgery with pt to choose appropriate options.    Pt is doing really well with her changes.  Pt states she bought measuring cups. Pt state she is still taking phentermine.   Pt arrives having lost about 17 pounds. Pt states she has been in the gym for the last 3 weeks and has really good about it and really excited in addition to the exercises she does at home.   Pt still needs to lose 25 pounds.  Will have pt back in 2 weeks to see if she is still losing weight and then notify [facility] she is ready if she does lose weight.   Start weight at [facility]: 411.3 Weight: 385 BMI: 64.02  MEDICATIONS: See List   DIETARY INTAKE:  24-hr recall: wakes at 3-4am B ( 4AM): 2 packets of butter grits and 3 sausage or cereal (high sugar)  Snk ( AM): 17 grapes L ( 11 AM):spam sandwich and carrots  Snk ( PM): graham crackers  D ( PM): fish sticks and salad  Snk ( PM): Beverages: 80oz water, sweet tea (staing this is once a year)   Usual physical activity: Chair exercises 3 times a day 7 days a week; 80 times each and working out at the gym 5 days a week  Diet to Follow: 1600 calories 180 g carbohydrates 120 g protein 44 g fat   Nutritional Diagnosis:  [place]-3.3 Overweight/obesity related to past poor dietary habits and physical inactivity as evidenced by patient w/ planned Sleeve Gastrectomy surgery following dietary guidelines for continued weight loss.    Intervention:  Nutrition  counseling for upcoming Bariatric Surgery. Goals: -Drink a minimum of 3 of your cups a day, 7 days a week -Continue to limit your rice to half a cup -Continue to use less butter -Continue to have carrots with your sandwiches  -Keep up the great work!  Teaching Method Utilized:  Visual Auditory Hands on  Barriers to learning/adherence to lifestyle change: none identified   Demonstrated degree of understanding via:  Teach Back   Monitoring/Evaluation:  Dietary intake, exercise,and body weight prn.

## 2018-11-23 ENCOUNTER — Encounter: Payer: Medicare HMO | Admitting: Dietician

## 2018-11-23 ENCOUNTER — Other Ambulatory Visit: Payer: Self-pay

## 2018-11-23 DIAGNOSIS — Z713 Dietary counseling and surveillance: Secondary | ICD-10-CM | POA: Diagnosis not present

## 2018-11-23 DIAGNOSIS — E669 Obesity, unspecified: Secondary | ICD-10-CM

## 2018-11-23 NOTE — Patient Instructions (Signed)
Remember your goals:   Measure your foods and have no more than 1/2 a cup at a time of rice, corn, or potatoes  Keep on eating lots of veggies! Great job!   Continue to drink lots of water. No sweet drinks.   Good job on being active and going to the gym, keep up the great work.   NEW: Try to not drink any fluids with your meals. Stop 15 minutes before you eat and wait 30 minutes after you eat before drinking water again.

## 2018-11-23 NOTE — Progress Notes (Signed)
Bariatric Nutrition Appointment   Assessment  9th SWL Appointment Planned Surgery: Sleeve  According to pts referral, surgeon would like pt to work with dietitian for 3-6 months and lose 50 pounds before surgery. Pt arrives having lost about 2 pounds since previous nutrition visit 1 month ago.  Total weight lost thus far: 28.5 lbs  Start weight at [facility]: 411.3 lbs (01/26/2018) Weight today: 382.8 lbs  BMI: 63.5   Lifestyle & Nutrition Hx  Pt states she lives with her son and brother stating her son is on probation and just got out of jail. Pt does not have Internet access and does not know how to cook. Pt has a list of vitamins her insurance company will cover: dietitian will review this list closer to surgery with pt to choose appropriate options. Pt state she is still taking phentermine.   Pt is doing really well with her changes. Pt states she bought measuring cups. Pt states she is continuing to go to the gym 5 days/week and doing exercises at home as well.   24-hr recall B ( 4AM): 2 packets of butter grits and 3 sausage (or high sugar cereal)  Snk ( AM): usually none  L ( 11 AM): tuna salad + carrots  Snk ( PM): usually none  D ( PM): baked pork chop + cabbage + corn   Snk ( PM): usually none  Beverages: 80 oz water, sometimes fruit punch or sweet tea   Usual physical activity: treadmill and various machines at the gym 5 days/week; chair exercises at home   Estimated energy needs:  1600 calories 180 g carbohydrates 120 g protein 44 g fat   Nutrition Diagnosis:  [place]-3.3 Overweight/obesity related to past poor dietary habits and physical inactivity as evidenced by patient w/ planned Sleeve Gastrectomy surgery following dietary guidelines for continued weight loss.    Nutrition Intervention:   Nutrition counseling for upcoming Bariatric Surgery. Goals: -Drink a minimum of 3 of your cups a day, 7 days a week -Continue to limit your rice, corn, and potatoes to half a  cup -Continue to use less butter -Continue to have carrots with your sandwiches  -Continue to drink lots of water  -Keep up the physical activity! -NEW: Try to not drink any fluids with your meals  Teaching Method Utilized: Visual, Auditory, Hands on Barriers to learning/adherence to lifestyle change: none identified  Demonstrated degree of understanding via:  teach back   Monitoring/Evaluation:   Dietary intake, exercise,and body weight in 1 month.  Will continue to monitor patient's weight until approved for surgery by surgeon. Will continue to provide diet education and work on nutrition goals.

## 2018-12-24 ENCOUNTER — Encounter: Payer: Self-pay | Admitting: Dietician

## 2018-12-24 ENCOUNTER — Encounter: Payer: Medicare HMO | Attending: Surgery | Admitting: Dietician

## 2018-12-24 ENCOUNTER — Other Ambulatory Visit: Payer: Self-pay

## 2018-12-24 DIAGNOSIS — Z6841 Body Mass Index (BMI) 40.0 and over, adult: Secondary | ICD-10-CM | POA: Insufficient documentation

## 2018-12-24 DIAGNOSIS — F329 Major depressive disorder, single episode, unspecified: Secondary | ICD-10-CM | POA: Diagnosis not present

## 2018-12-24 DIAGNOSIS — M1990 Unspecified osteoarthritis, unspecified site: Secondary | ICD-10-CM | POA: Diagnosis not present

## 2018-12-24 DIAGNOSIS — Z79899 Other long term (current) drug therapy: Secondary | ICD-10-CM | POA: Insufficient documentation

## 2018-12-24 DIAGNOSIS — E669 Obesity, unspecified: Secondary | ICD-10-CM

## 2018-12-24 DIAGNOSIS — I10 Essential (primary) hypertension: Secondary | ICD-10-CM | POA: Insufficient documentation

## 2018-12-24 DIAGNOSIS — Z713 Dietary counseling and surveillance: Secondary | ICD-10-CM | POA: Diagnosis present

## 2018-12-24 DIAGNOSIS — E7800 Pure hypercholesterolemia, unspecified: Secondary | ICD-10-CM | POA: Diagnosis not present

## 2018-12-24 NOTE — Progress Notes (Signed)
Bariatric Nutrition Appointment   Assessment  10th SWL Appointment Planned Surgery: Sleeve  According to pts referral, surgeon would like pt to work with dietitian for 3-6 months and lose 50 pounds before surgery. Pt arrives having lost about 6 pounds since previous nutrition visit 1 month ago.  Total weight lost thus far: 34.3 lbs  Start weight at [facility]: 411.3 lbs (01/26/2018) Weight today: 377 lbs  BMI: 62.5  Lifestyle & Nutrition Hx  Pt states she lives with her son and brother stating her son is on probation and just got out of jail. Pt does not have Internet access and does not know how to cook. Pt has a list of vitamins her insurance company will cover, states she will bring this in at her next appointment (11th SWL) for dietitian to review and help choose appropriate options. Pt state she is still taking phentermine.    Pt is doing really well with her changes. Pt is using measuring cups to portion her food. Pt states she is continuing to go to the gym 5 days/week and doing exercises at home as well. Avoiding sweets, only drinking water, and using less butter.   24-Hr Dietary Recall B ( 4AM): 1 packet of butter grits and 2 sausage  Snk ( AM): usually none  L ( 11 AM): tuna salad + carrots  Snk ( PM): usually none  D ( PM): baked chicken + mixed greens + corn + mac & cheese   Snk ( PM): usually none  Beverages: 80 oz water   Usual physical activity: treadmill and various machines at the gym 5 days/week; chair exercises at home   Estimated energy needs:  1600 calories 180 g carbohydrates 120 g protein 44 g fat   Nutrition Diagnosis:  -3.3 Overweight/obesity related to past poor dietary habits and physical inactivity as evidenced by patient w/ planned Sleeve Gastrectomy surgery following dietary guidelines for continued weight loss.    Nutrition Intervention:   Nutrition counseling for upcoming Bariatric Surgery. Goals: -Continue to limit rice, corn, and potatoes to half  a cup -Continue to use less butter -Continue to have carrots with sandwiches  -Continue to drink lots of water  -Keep up the physical activity! -Working on not drinking any fluids with meals -NEW: Try various protein drinks/powders   Teaching Method Utilized: Visual, Auditory, Hands on Barriers to learning/adherence to lifestyle change: none identified  Demonstrated degree of understanding via:  teach back   RD's Notes for Next Visit - Pt states she will bring in a list of vitamins to review - Continue education on portion control and balanced meals   Monitoring/Evaluation:   Dietary intake, exercise,and body weight in 1 month.  Will continue to monitor patient's weight until approved for surgery by surgeon. Will continue to provide diet education and work on nutrition goals.

## 2019-01-17 ENCOUNTER — Encounter: Payer: Medicare HMO | Attending: Surgery | Admitting: Skilled Nursing Facility1

## 2019-01-17 ENCOUNTER — Other Ambulatory Visit: Payer: Self-pay

## 2019-01-17 DIAGNOSIS — I10 Essential (primary) hypertension: Secondary | ICD-10-CM | POA: Insufficient documentation

## 2019-01-17 DIAGNOSIS — Z713 Dietary counseling and surveillance: Secondary | ICD-10-CM | POA: Insufficient documentation

## 2019-01-17 DIAGNOSIS — Z79899 Other long term (current) drug therapy: Secondary | ICD-10-CM | POA: Insufficient documentation

## 2019-01-17 DIAGNOSIS — E669 Obesity, unspecified: Secondary | ICD-10-CM

## 2019-01-17 DIAGNOSIS — E7800 Pure hypercholesterolemia, unspecified: Secondary | ICD-10-CM | POA: Insufficient documentation

## 2019-01-17 DIAGNOSIS — Z6841 Body Mass Index (BMI) 40.0 and over, adult: Secondary | ICD-10-CM | POA: Insufficient documentation

## 2019-01-17 NOTE — Progress Notes (Signed)
Bariatric Nutrition Appointment   Assessment  11th SWL Appointment Planned Surgery: Sleeve  According to pts referral, surgeon would like pt to work with dietitian for 3-6 months and lose 50 pounds before surgery. Pt arrives having lost about 6 pounds since previous nutrition visit 1 month ago.  Total weight lost thus far: 37.5 lbs  Patient reports doing exercise, drinking protein. Gym Mon-Fri. Pt reports that it has become easy to carry on goals day to day. Feels good, more energy, and can feel the weight loss. Very optimistic. Walks 40 minutes at a time, reported swelling in her leg. Did not overexert herself. RD to communicate with surgeon about potential to move forward with surgery due to patient success, even though she has not met the 50 lb. Weight loss goal. RD set pt up for next month's appointment with anticipation of potential rescheduling per surgeon's response.  Start weight at [facility]: 411.3 lbs (01/26/2018) Weight today: 373.8 lbs (-4 lbs from last visit)  BMI: 62.0  Lifestyle & Nutrition Hx  Pt states she lives with her son and brother stating her son is on probation and just got out of jail. Pt does not have Internet access and does not know how to cook. Pt has a list of vitamins her insurance company will cover, states she will bring this in at her next appointment (11th SWL) for dietitian to review and help choose appropriate options.    24-Hr Dietary Recall B ( 4AM): Protein drink Snk ( AM): usually none  L ( 11 AM): tuna salad + lettuce + carrots  Snk ( PM): usually none (Sola bar daily) D ( PM): baked chicken wings + mixed greens + corn  Snk ( PM): usually none  Beverages: 80 oz water   Usual physical activity: treadmill and various machines at the gym 5 days/week; chair exercises at home   Estimated energy needs:  1600 calories 180 g carbohydrates 120 g protein 44 g fat   Nutrition Diagnosis:  [place]-3.3 Overweight/obesity related to past poor dietary habits and  physical inactivity as evidenced by patient w/ planned Sleeve Gastrectomy surgery following dietary guidelines for continued weight loss.    Nutrition Intervention:   Nutrition counseling for upcoming Bariatric Surgery. Goals: -Continue to limit rice, corn, and potatoes to half a cup -Continue to use less butter -Continue to have carrots with sandwiches  -Continue to drink lots of water  -Keep up the physical activity! -Working on not drinking any fluids with meals -Try various protein drinks/powders   RD to communicate with surgeon about potential to move forward with surgery due to patient success, even though she has not met the 50 lb. Weight loss goal.  Teaching Method Utilized: Visual, Auditory, Hands on Barriers to learning/adherence to lifestyle change: none identified  Demonstrated degree of understanding via:  teach back   RD's Notes for Next Visit - Pt states she will bring in a list of vitamins to review - Continue education on portion control and balanced meals   Monitoring/Evaluation:   Dietary intake, exercise,and body weight in 1 month.  Will continue to monitor patient's weight until approved for surgery by surgeon. Will continue to provide diet education and work on nutrition goals.

## 2019-02-03 ENCOUNTER — Ambulatory Visit (INDEPENDENT_AMBULATORY_CARE_PROVIDER_SITE_OTHER): Payer: Medicare HMO | Admitting: Neurology

## 2019-02-03 ENCOUNTER — Other Ambulatory Visit: Payer: Self-pay

## 2019-02-03 ENCOUNTER — Encounter: Payer: Self-pay | Admitting: Neurology

## 2019-02-03 VITALS — BP 115/75 | HR 64 | Ht 66.0 in | Wt 372.0 lb

## 2019-02-03 DIAGNOSIS — R351 Nocturia: Secondary | ICD-10-CM

## 2019-02-03 DIAGNOSIS — Z9189 Other specified personal risk factors, not elsewhere classified: Secondary | ICD-10-CM

## 2019-02-03 DIAGNOSIS — Z820 Family history of epilepsy and other diseases of the nervous system: Secondary | ICD-10-CM | POA: Diagnosis not present

## 2019-02-03 DIAGNOSIS — Z6841 Body Mass Index (BMI) 40.0 and over, adult: Secondary | ICD-10-CM | POA: Diagnosis not present

## 2019-02-03 NOTE — Patient Instructions (Signed)

## 2019-02-03 NOTE — Progress Notes (Signed)
Subjective:    Patient ID: Carla Sloan is a 58 y.o. female.  HPI     Star Age, MD, PhD [facility] [address], [address] [place], [place] [address]  Dear Dr. Windle Guard, I saw your patient, Carla Sloan, upon your kind request in my sleep clinic today for initial consultation of her sleep disorder, in particular, concern for underlying obstructive sleep apnea.  The patient is unaccompanied today.  As you know, [name] is a 58 year old right-handed woman with an underlying medical history of hypertension, glaucoma, hypertension, depression, arthritis, and morbid obesity with a BMI of over 60, who reports some sleep disruption due to nocturia and a family history of sleep apnea.  Her brother has a CPAP machine and she believes that her sister may have a CPAP machine as well.  Her brother lives with her.  She has a son and a daughter.  Patient goes to bed around 7 PM and rise time is around 4 as she needs to get ready to take her daughter to work.  I reviewed your office note from 12/16/2017.  Her Epworth sleepiness score is 2 out of 24.  She is being evaluated for bariatric surgery.  She reports nocturia about twice per average night and denies any recurrent morning headaches.  She currently does not consume any caffeine on her daily basis, quit drinking alcohol, does not smoke and quit using cocaine about 10 years ago.  She is not sure if she snores.   Her Past Medical History Is Significant For: Past Medical History:  Diagnosis Date  . Arthritis   . Depression   . Hypertension   . Muscle spasms of lower extremity    and back    Her Past Surgical History Is Significant For: Past Surgical History:  Procedure Laterality Date  . COLONOSCOPY WITH PROPOFOL N/A 09/24/2012   Procedure: COLONOSCOPY WITH PROPOFOL;  Surgeon: Jeryl Columbia, MD;  Location: [facility] ENDOSCOPY;  Service: Endoscopy;  Laterality: N/A;  . COLONOSCOPY WITH PROPOFOL N/A  05/19/2016   Procedure: COLONOSCOPY WITH PROPOFOL;  Surgeon: Clarene Essex, MD;  Location: [facility] ENDOSCOPY;  Service: Endoscopy;  Laterality: N/A;  . NO PAST SURGERIES      Her Family History Is Significant For: Family History  Problem Relation Age of Onset  . Hypertension Mother   . Diabetes Mother   . Breast cancer Mother   . Diabetes Father   . Hypertension Father     Her Social History Is Significant For: Social History   Socioeconomic History  . Marital status: Single    Spouse name: Not on file  . Number of children: Not on file  . Years of education: Not on file  . Highest education level: Not on file  Occupational History  . Not on file  Tobacco Use  . Smoking status: Never Smoker  . Smokeless tobacco: Never Used  Substance and Sexual Activity  . Alcohol use: No  . Drug use: No  . Sexual activity: Yes    Birth control/protection: None  Other Topics Concern  . Not on file  Social History Narrative  . Not on file   Social Determinants of Health   Financial Resource Strain:   . Difficulty of Paying Living Expenses: Not on file  Food Insecurity:   . Worried About Charity fundraiser in the Last Year: Not on file  . Ran Out of Food in the Last Year: Not on file  Transportation Needs:   .  Lack of Transportation (Medical): Not on file  . Lack of Transportation (Non-Medical): Not on file  Physical Activity:   . Days of Exercise per Week: Not on file  . Minutes of Exercise per Session: Not on file  Stress:   . Feeling of Stress : Not on file  Social Connections:   . Frequency of Communication with Friends and Family: Not on file  . Frequency of Social Gatherings with Friends and Family: Not on file  . Attends Religious Services: Not on file  . Active Member of Clubs or Organizations: Not on file  . Attends Archivist Meetings: Not on file  . Marital Status: Not on file    Her Allergies Are:  No Known Allergies:   Her Current Medications Are:   Outpatient Encounter Medications as of 02/03/2019  Medication Sig  . amLODipine (NORVASC) 10 MG tablet Take 10 mg by mouth daily.  . bimatoprost (LUMIGAN) 0.03 % ophthalmic solution 1 drop.  . brimonidine-timolol (COMBIGAN) 0.2-0.5 % ophthalmic solution Place 1 drop into both eyes every 12 (twelve) hours.  . citalopram (CELEXA) 40 MG tablet Take 40 mg by mouth at bedtime.  . cyclobenzaprine (FLEXERIL) 5 MG tablet   . diclofenac (VOLTAREN) 75 MG EC tablet Take 75 mg by mouth 2 (two) times daily.  . diphenhydrAMINE (BENADRYL) 25 MG tablet Take 25 mg by mouth every 6 (six) hours as needed for itching or allergies.  Marland Kitchen ergocalciferol (VITAMIN D2) 1.25 MG (50000 UT) capsule Take 50,000 Units by mouth once a week.  . gabapentin (NEURONTIN) 400 MG capsule Take 400-800 mg by mouth 2 (two) times daily. 1 cap in the morning and 2 cap at night  . hydrochlorothiazide (HYDRODIURIL) 25 MG tablet Take 25 mg by mouth every morning.   . hydrOXYzine (VISTARIL) 100 MG capsule Take 100 mg by mouth.  . Lurasidone HCl (LATUDA) 60 MG TABS Take by mouth at bedtime.  Marland Kitchen tinidazole (TINDAMAX) 500 MG tablet Take 4 tablets (2,000 mg total) by mouth daily with breakfast.  . [DISCONTINUED] cholecalciferol (VITAMIN D3) 25 MCG (1000 UT) tablet Take 1,000 Units by mouth daily.  . [DISCONTINUED] famotidine (PEPCID) 20 MG tablet Take 20 mg by mouth 2 (two) times daily.  . [DISCONTINUED] Lurasidone HCl (LATUDA) 120 MG TABS Take 120 mg by mouth at bedtime.  . [DISCONTINUED] verapamil (VERELAN PM) 360 MG 24 hr capsule Take 360 mg by mouth at bedtime.   No facility-administered encounter medications on file as of 02/03/2019.  :  Review of Systems:  Out of a complete 14 point review of systems, all are reviewed and negative with the exception of these symptoms as listed below:  Review of Systems  Neurological:       Pt presents today to discuss her sleep. Pt has never had a sleep study. She is unsure if she snores.  Epworth  Sleepiness Scale 0= would never doze 1= slight chance of dozing 2= moderate chance of dozing 3= high chance of dozing  Sitting and reading: 0 Watching TV: 1 Sitting inactive in a public place (ex. Theater or meeting): 0 As a passenger in a car for an hour without a break: 0 Lying down to rest in the afternoon: 1 Sitting and talking to someone: 0 Sitting quietly after lunch (no alcohol): 0 In a car, while stopped in traffic: 0 Total: 2     Objective:  Neurological Exam  Physical Exam Physical Examination:   Vitals:   02/03/19 0859  BP: 115/75  Pulse: 64    General Examination: The patient is a very pleasant 58 y.o. female in no acute distress. She appears well-developed and well-nourished and well groomed.   HEENT: Normocephalic, atraumatic, pupils are equal, round and reactive to light,Left eye exotropia noted, not new per patient, she has corrective eyeglasses.  Extraocular tracking is preserved with the right eye.  Face is symmetric with no facial Asymmetry otherwise, airway examination reveals mild mouth dryness, adequate dental hygiene but several missing teeth, she reports that she has a partial plate for the top, mild airway crowding noted secondary to smaller airway entry, tonsils are about 1+, neck circumference 15-5/8 inches, Mallampati class II.  Tongue protrudes centrally in palate elevates symmetrically.  Chest: Clear to auscultation without wheezing, rhonchi or crackles noted.  Heart: S1+S2+0, regular and normal without murmurs, rubs or gallops noted.   Abdomen: Soft, non-tender and non-distended with normal bowel sounds appreciated on auscultation.  Extremities: There is no pitting edema in the distal lower extremities bilaterally.   Skin: Warm and dry without trophic changes noted.   Musculoskeletal: exam reveals no obvious joint deformities, tenderness or joint swelling or erythema.   Neurologically:  Mental status: The patient is awake, alert and  oriented in all 4 spheres. Her immediate and remote memory, attention, language skills and fund of knowledge are appropriate. There is no evidence of aphasia, agnosia, apraxia or anomia. Speech is clear with normal prosody and enunciation. Thought process is linear. Mood is normal and affect is normal.  Cranial nerves II - XII are as described above under HEENT exam.  Motor exam: Normal bulk, strength and tone is noted. There is no tremor, fine motor skills and coordination: grossly intact.  Cerebellar testing: No dysmetria or intention tremor. There is no truncal or gait ataxia.  Sensory exam: intact to light touch in the upper and lower extremities.  Gait, station and balance: She stands easily. No veering to one side is noted. No leaning to one side is noted. Posture is age-appropriate and stance is narrow based. Gait shows normal stride length and normal pace. No problems turning are noted.   Assessment and Plan:  In summary, Namita Eisenberger is a very pleasant 58 y.o.-year old female with an underlying medical history of hypertension, glaucoma, hypertension, depression, arthritis, and morbid obesity with a BMI of over 60, whose history and physical exam are concerning for obstructive sleep apnea (OSA). I had a long chat with the patient about my findings and the diagnosis of OSA, its prognosis and treatment options. We talked about medical treatments, surgical interventions and non-pharmacological approaches. I explained in particular the risks and ramifications of untreated moderate to severe OSA, especially with respect to developing cardiovascular disease down the Road, including congestive heart failure, difficult to treat hypertension, cardiac arrhythmias, or stroke. Even type 2 diabetes has, in part, been linked to untreated OSA. Symptoms of untreated OSA include daytime sleepiness, memory problems, mood irritability and mood disorder such as depression and anxiety, lack of energy, as  well as recurrent headaches, especially morning headaches. We talked about trying to maintain a healthy lifestyle in general, as well as the importance of weight control. We also talked about the importance of good sleep hygiene. I recommended the following at this time: sleep study.  I explained the sleep test procedure to the patient and also outlined possible treatment options of OSA. I explained the CPAP treatment option to the patient, who indicated that she would be willing to try CPAP if  the need arises. I explained the importance of being compliant with PAP treatment, not only for insurance purposes but primarily to improve Her symptoms, and for the patient's long term health benefit, including to reduce Her cardiovascular risks. I answered all her questions today and the patient was in agreement. I plan to see her back after the sleep study is completed and encouraged her to call with any interim questions, concerns, problems or updates.   Thank you very much for allowing me to participate in the care of this nice patient. If I can be of any further assistance to you please do not hesitate to call me at [phone].  Sincerely,   Star Age, MD, PhD

## 2019-02-14 ENCOUNTER — Encounter: Payer: Medicare HMO | Attending: Surgery | Admitting: Skilled Nursing Facility1

## 2019-02-14 ENCOUNTER — Other Ambulatory Visit: Payer: Self-pay

## 2019-02-14 DIAGNOSIS — I10 Essential (primary) hypertension: Secondary | ICD-10-CM | POA: Insufficient documentation

## 2019-02-14 DIAGNOSIS — E7800 Pure hypercholesterolemia, unspecified: Secondary | ICD-10-CM | POA: Diagnosis not present

## 2019-02-14 DIAGNOSIS — Z6841 Body Mass Index (BMI) 40.0 and over, adult: Secondary | ICD-10-CM | POA: Diagnosis not present

## 2019-02-14 DIAGNOSIS — Z713 Dietary counseling and surveillance: Secondary | ICD-10-CM | POA: Insufficient documentation

## 2019-02-14 DIAGNOSIS — Z79899 Other long term (current) drug therapy: Secondary | ICD-10-CM | POA: Diagnosis not present

## 2019-02-14 DIAGNOSIS — E669 Obesity, unspecified: Secondary | ICD-10-CM

## 2019-02-14 NOTE — Progress Notes (Signed)
Bariatric Nutrition Appointment   Assessment  11th SWL Appointment Planned Surgery: Sleeve  According to pts referral, surgeon would like pt to work with dietitian for 3-6 months and lose 50 pounds before surgery.   Patient reports doing exercise, drinking protein. Gym Mon-Fri. Pt reports that it has become easy to carry on goals day to day. Feels good, more energy, and can feel the weight loss. Very optimistic. Walks 40 minutes at a time.  Pt brought in list of vitamins dietitian helped pt pick some out for herself that are free to her,  Start weight at [facility]: 411.3 lbs (01/26/2018) Weight today: 365.8 lbs (-8 lbs from last visit)  BMI: 59.04  Lifestyle & Nutrition Hx  Pt states she lives with her son and brother stating her son is on probation and just got out of jail. Pt does not have Internet access and does not know how to cook.   Pt states she has met some friends at the gym. Pt states she is supposed to be moving soon which will be by herself which she is looking forward too.   Pt states she is now in the pathway to have surgery.  24-Hr Dietary Recall B ( 4AM): Protein drink or 2 sausages and 2 packets of grits Snk ( AM): usually none  L ( 11 AM): tuna salad + lettuce + carrots or noodles + lettuce and carrots Snk ( PM): usually none (Sola bar daily) D ( PM): baked chicken wings + mixed greens + corn or ox tails and collard greens and carrots Snk ( PM): usually none  Beverages: 80 oz water   Usual physical activity: treadmill and various machines at the gym 5 days/week 40 minutes; chair exercises at home   Estimated energy needs:  1600 calories 180 g carbohydrates 120 g protein 44 g fat   Nutrition Diagnosis:  -3.3 Overweight/obesity related to past poor dietary habits and physical inactivity as evidenced by patient w/ planned Sleeve Gastrectomy surgery following dietary guidelines for continued weight loss.    Nutrition Intervention:   Nutrition counseling for  upcoming Bariatric Surgery. Goals: -Continue to limit rice, corn, and potatoes to half a cup -Continue to use less butter -Continue to have carrots with sandwiches  -Continue to drink lots of water  -Keep up the physical activity! -Working on not drinking any fluids with meals -Try various protein drinks/powders  -Cut back to one packet of grits    Teaching Method Utilized: Visual, Auditory, Hands on Barriers to learning/adherence to lifestyle change: none identified  Demonstrated degree of understanding via:  teach back   RD's Notes for Next Visit - Pt states she will bring in a list of vitamins to review - Continue education on portion control and balanced meals   Monitoring/Evaluation:   Dietary intake, exercise,and body weight in 1 month.

## 2019-03-11 ENCOUNTER — Other Ambulatory Visit: Payer: Self-pay | Admitting: Internal Medicine

## 2019-03-11 DIAGNOSIS — Z1231 Encounter for screening mammogram for malignant neoplasm of breast: Secondary | ICD-10-CM

## 2019-03-14 ENCOUNTER — Encounter: Payer: Medicare HMO | Attending: Surgery | Admitting: Skilled Nursing Facility1

## 2019-03-14 ENCOUNTER — Other Ambulatory Visit: Payer: Self-pay

## 2019-03-14 DIAGNOSIS — E7800 Pure hypercholesterolemia, unspecified: Secondary | ICD-10-CM | POA: Insufficient documentation

## 2019-03-14 DIAGNOSIS — Z79899 Other long term (current) drug therapy: Secondary | ICD-10-CM | POA: Insufficient documentation

## 2019-03-14 DIAGNOSIS — Z713 Dietary counseling and surveillance: Secondary | ICD-10-CM | POA: Insufficient documentation

## 2019-03-14 DIAGNOSIS — Z6841 Body Mass Index (BMI) 40.0 and over, adult: Secondary | ICD-10-CM | POA: Insufficient documentation

## 2019-03-14 DIAGNOSIS — I10 Essential (primary) hypertension: Secondary | ICD-10-CM | POA: Insufficient documentation

## 2019-03-14 DIAGNOSIS — E669 Obesity, unspecified: Secondary | ICD-10-CM

## 2019-03-14 NOTE — Progress Notes (Signed)
Bariatric Nutrition Appointment   Assessment  Planned Surgery: Sleeve  According to pts referral, surgeon would like pt to work with dietitian for 3-6 months and lose 50 pounds before surgery.   Patient reports doing exercise, drinking protein. Gym Mon-Fri. Pt reports that it has become easy to carry on goals day to day. Feels good, more energy, and can feel the weight loss. Very optimistic. Walks 40 minutes at a time.  Start weight at [facility]: 411.3 lbs (01/26/2018) Weight today: 366 lbs (-maintained weight)  BMI: 59.04  Lifestyle & Nutrition Hx  Pt states she lives with her son and brother stating her son is on probation and just got out of jail. Pt does not have Internet access and does not know how to cook.   Pt states she has met some friends at the gym. Pt states she is supposed to be moving soon which will be by herself which she is looking forward too.   Pt states she is now in the pathway to have surgery.  Pt states she has not been able to be as active as she wants due to leg pain will be seeing her doctor for this. Pt states when she wants something sweet she drinks water.   24-Hr Dietary Recall B ( 4AM): Protein drink or 2 sausages and 2 packets of grits or 3 sausage links and grits with 8 ounces orange juice Snk ( AM): usually none  L ( 11 AM): tuna salad + lettuce + carrots or noodles + lettuce and carrots Snk ( PM): usually none  D ( PM): baked chicken wings + mixed greens + corn or ox tails and collard greens and carrots or mac n cheese  Snk ( PM): usually none  Beverages: 80 oz water   Usual physical activity: ADL's  Estimated energy needs:  1600 calories 180 g carbohydrates 120 g protein 44 g fat   Nutrition Diagnosis:  [place]-3.3 Overweight/obesity related to past poor dietary habits and physical inactivity as evidenced by patient w/ planned Sleeve Gastrectomy surgery following dietary guidelines for continued weight loss.    Nutrition Intervention:   Nutrition  counseling for upcoming Bariatric Surgery. Goals: -Continue to limit rice, corn, and potatoes to half a cup -Continue to use less butter -Continue to have carrots with sandwiches  -Continue to drink lots of water  -Keep up the physical activity! -Working on not drinking any fluids with meals -Try various protein drinks/powders  -Cut back to one packet of grits  -when you eat mac n cheese or pasta do not eat bread -Do arm chair excises 3 times a day    Teaching Method Utilized: Visual, Auditory, Hands on Barriers to learning/adherence to lifestyle change: none identified  Demonstrated degree of understanding via:  teach back    Monitoring/Evaluation:   Dietary intake, exercise,and body weight in 1 month.

## 2019-04-07 ENCOUNTER — Ambulatory Visit (INDEPENDENT_AMBULATORY_CARE_PROVIDER_SITE_OTHER): Payer: Medicare HMO | Admitting: Neurology

## 2019-04-07 ENCOUNTER — Other Ambulatory Visit: Payer: Self-pay

## 2019-04-07 DIAGNOSIS — Z820 Family history of epilepsy and other diseases of the nervous system: Secondary | ICD-10-CM

## 2019-04-07 DIAGNOSIS — R351 Nocturia: Secondary | ICD-10-CM

## 2019-04-07 DIAGNOSIS — G4733 Obstructive sleep apnea (adult) (pediatric): Secondary | ICD-10-CM

## 2019-04-07 DIAGNOSIS — Z6841 Body Mass Index (BMI) 40.0 and over, adult: Secondary | ICD-10-CM

## 2019-04-07 DIAGNOSIS — Z9189 Other specified personal risk factors, not elsewhere classified: Secondary | ICD-10-CM

## 2019-04-07 DIAGNOSIS — G4720 Circadian rhythm sleep disorder, unspecified type: Secondary | ICD-10-CM

## 2019-04-11 ENCOUNTER — Encounter: Payer: Self-pay | Admitting: Skilled Nursing Facility1

## 2019-04-11 ENCOUNTER — Encounter: Payer: Medicare HMO | Attending: Surgery | Admitting: Skilled Nursing Facility1

## 2019-04-11 ENCOUNTER — Other Ambulatory Visit: Payer: Self-pay

## 2019-04-11 DIAGNOSIS — Z79899 Other long term (current) drug therapy: Secondary | ICD-10-CM | POA: Insufficient documentation

## 2019-04-11 DIAGNOSIS — I10 Essential (primary) hypertension: Secondary | ICD-10-CM | POA: Diagnosis not present

## 2019-04-11 DIAGNOSIS — Z6841 Body Mass Index (BMI) 40.0 and over, adult: Secondary | ICD-10-CM | POA: Diagnosis not present

## 2019-04-11 DIAGNOSIS — Z713 Dietary counseling and surveillance: Secondary | ICD-10-CM | POA: Insufficient documentation

## 2019-04-11 DIAGNOSIS — E7800 Pure hypercholesterolemia, unspecified: Secondary | ICD-10-CM | POA: Diagnosis not present

## 2019-04-11 DIAGNOSIS — E669 Obesity, unspecified: Secondary | ICD-10-CM

## 2019-04-11 NOTE — Progress Notes (Signed)
Bariatric Nutrition Appointment   Assessment  Planned Surgery: Sleeve no date  According to pts referral, surgeon would like pt to work with dietitian for 3-6 months and lose 50 pounds before surgery.   Patient reports doing exercise, drinking protein. Gym Mon-Fri. Pt reports that it has become easy to carry on goals day to day. Feels good, more energy, and can feel the weight loss. Very optimistic. Walks 40 minutes at a time.  Start weight at [facility]: 411.3 lbs (01/26/2018) Weight today: 366 lbs (-maintained weight)  BMI: 59.04  Lifestyle & Nutrition Hx  Pt states she lives with her son and brother stating her son is on probation and just got out of jail. Pt does not have Internet access and does not know how to cook.  Pt states she is now in the pathway to have surgery.  Pt states she has not been able to be as active as she wants due to leg pain will be seeing her doctor for this. Pt states when she wants something sweet she drinks water. Pt states she has been Walking and situps. She notes she is eating smaller portion sizes.  24-Hr Dietary Recall B ( 4AM): fruit loop cereal 1/2 bowl 2% milk Snk ( AM): usually none  L ( 11 AM): tuna salad with lettuce and carrot Snk ( PM): usually none  D ( PM): oxtail mixed mustard greens rice Snk ( PM): usually none  Beverages: 96oz bottled  Usual physical activity: ADL's  Estimated energy needs:  1600 calories 180 g carbohydrates 120 g protein 44 g fat   Nutrition Diagnosis:  [place]-3.3 Overweight/obesity related to past poor dietary habits and physical inactivity as evidenced by patient w/ planned Sleeve Gastrectomy surgery following dietary guidelines for continued weight loss.    Nutrition Intervention:   Nutrition counseling for upcoming Bariatric Surgery. Goals: -Continue to limit rice, corn, and potatoes to half a cup -Continue to use less butter -Continue to have carrots with sandwiches  -Continue to drink lots of water  -Keep up  the physical activity! -Working on not drinking any fluids with meals -Try various protein drinks/powders: no will buy to take after surgery -Cut back to one packet of grits: goal met -when you eat mac n cheese or pasta do not eat bread -Do arm chair excises 3 times a day    Teaching Method Utilized: Visual, Auditory, Hands on Barriers to learning/adherence to lifestyle change: none identified  Demonstrated degree of understanding via:  teach back    Monitoring/Evaluation:   Dietary intake, exercise,and body weight in 1 month. 

## 2019-04-19 NOTE — Procedures (Signed)
PATIENT'S NAME:  Carla Sloan, Carla Sloan DOB:      Dec 10, 1960      MR#:    [id number]     DATE OF RECORDING: 04/07/2019 REFERRING M.D.:  Romana Juniper, MD Study Performed:   Baseline Polysomnogram HISTORY: 59 year old woman with a history of hypertension, glaucoma, hypertension, depression, arthritis, and morbid obesity with a BMI of over 60, who reports some sleep disruption due to nocturia and a family history of sleep apnea. The patient endorsed the Epworth Sleepiness Scale at 2 points. The patient's weight 372 pounds with a height of 66 (inches), resulting in a BMI of 59.9 kg/m2. The patient's neck circumference measured 15.6 inches.  CURRENT MEDICATIONS: Norvasc, Lumigan, Combigan, Celexa, Flexeril, VoltarenBenadryl, Vitamin D, Neurontin, Hydrodiuril, Vistaril, Latuda, Tindamax   PROCEDURE:  This is a multichannel digital polysomnogram utilizing the Somnostar 11.2 system.  Electrodes and sensors were applied and monitored per AASM Specifications.   EEG, EOG, Chin and Limb EMG, were sampled at 200 Hz.  ECG, Snore and Nasal Pressure, Thermal Airflow, Respiratory Effort, CPAP Flow and Pressure, Oximetry was sampled at 50 Hz. Digital video and audio were recorded.      BASELINE STUDY  Lights Out was at 20:22 and Lights On at 03:46.  Total recording time (TRT) was 444.5 minutes, with a total sleep time (TST) of 371 minutes.   The patient's sleep latency was 21.5 minutes. REM latency was 145.5 minutes, which is mildly delayed. The sleep efficiency was 83.5 %.     SLEEP ARCHITECTURE: WASO (Wake after sleep onset) was 45.5 minutes with mild to moderate sleep fragmentation noted. There were 15 minutes in Stage N1, 223 minutes Stage N2, 84.5 minutes Stage N3 and 48.5 minutes in Stage REM. The percentage of Stage N1 was 4.%, Stage N2 was 60.1%, which is increased, Stage N3 was 22.8% and Stage R (REM sleep) was 13.1%, which is reduced. The arousals were noted as: 134 were spontaneous, 6 were associated  with PLMs, 21 were associated with respiratory events.  RESPIRATORY ANALYSIS:  There were a total of 43 respiratory events:  11 obstructive apneas, 0 central apneas and 6 mixed apneas with a total of 17 apneas and an apnea index (AI) of 2.7 /hour. There were 26 hypopneas with a hypopnea index of 4.2/hour. The patient also had 0 respiratory event related arousals (RERAs).      The total APNEA/HYPOPNEA INDEX (AHI) was 7./hour and the total RESPIRATORY DISTURBANCE INDEX was  7. /hour.  24 events occurred in REM sleep and 29 events in NREM. The REM AHI was  29.7 /hour, versus a non-REM AHI of 3.5. The patient spent 1.5 minutes of total sleep time in the supine position and 370 minutes in non-supine.. The supine AHI was 0.0 versus a non-supine AHI of 7.0.  OXYGEN SATURATION & C02:  The Wake baseline 02 saturation was 92%, with the lowest being 69%. Time spent below 89% saturation equaled 78 minutes.  PERIODIC LIMB MOVEMENTS: The patient had a total of 13 Periodic Limb Movements.  The Periodic Limb Movement (PLM) index was 2.1 and the PLM Arousal index was 1./hour.  Audio and video analysis did not show any abnormal or unusual movements, behaviors, phonations or vocalizations. The patient took no bathroom breaks. Mild to moderate snoring was noted. The EKG was in keeping with normal sinus rhythm (NSR).  Post-study, the patient indicated that sleep was better than usual.   IMPRESSION:  1. Obstructive Sleep Apnea (OSA) 2. Dysfunctions associated with sleep stages or arousal  from sleep  RECOMMENDATIONS:  1. This study demonstrates overall mild obstructive sleep apnea, but near-severe OSA in REM sleep with a total AHI of 7/hour, REM AHI of 29.7/hour, and O2 nadir of 69%. The (near) absence of supine sleep likely underestimates her AHI and O2 nadir. Given the patient's medical history and sleep related complaints, treatment with positive airway pressure is recommended; this can be achieved in the form of  autoPAP. Alternatively, a full-night CPAP titration study would allow optimization of therapy if needed. Other treatment options may include avoidance of supine sleep position along with weight loss, upper airway or jaw surgery in selected patients or the use of an oral appliance in certain patients. ENT evaluation and/or consultation with a maxillofacial surgeon or dentist may be feasible in some instances. Please note that untreated obstructive sleep apnea may carry additional perioperative morbidity. Patients with significant obstructive sleep apnea should receive perioperative PAP therapy and the surgeons and particularly the anesthesiologist should be informed of the diagnosis and the severity of the sleep disordered breathing. 2. The patient should be cautioned not to drive, work at heights, or operate dangerous or heavy equipment when tired or sleepy. Review and reiteration of good sleep hygiene measures should be pursued with any patient. 3. The patient will be seen in follow-up by Dr. Rexene Alberts at [facility] for discussion of the test results and further management strategies. The referring provider will be notified of the test results.  I certify that I have reviewed the entire raw data recording prior to the issuance of this report in accordance with the Standards of Accreditation of the American Academy of Sleep Medicine (AASM)   Star Age, MD, PhD Diplomat, American Board of Neurology and Sleep Medicine (Neurology and Sleep Medicine)

## 2019-04-19 NOTE — Addendum Note (Signed)
Addended by: Star Age on: 04/19/2019 08:39 AM   Modules accepted: Orders

## 2019-04-19 NOTE — Progress Notes (Signed)
Patient referred by Dr. Kae Heller with gen. Surgery and bar. surgery, seen by me on 02/03/19, diagnostic PSG on 04/07/19.    Please call and notify the patient that the recent sleep study showed obstructive sleep apnea. OSA is overall mild, but nearly severe during dream/REM sleep. It is worth treating to see if she feels better after treatment and in light of her bariatric surgery. To that end I recommend treatment for this in the form of autoPAP, which means, that we don't have to bring her back for a second sleep study with CPAP, but will let him try an autoPAP machine at home, through a DME company (of her choice, or as per insurance requirement). The DME representative will educate her on how to use the machine, how to put the mask on, etc. I have placed an order in the chart. Please send referral, talk to patient, send report to referring MD. We will need a FU in sleep clinic for 10 weeks post-PAP set up, please arrange that with me or one of our NPs. Thanks,   Star Age, MD, PhD [facility])

## 2019-04-21 ENCOUNTER — Telehealth: Payer: Self-pay

## 2019-04-21 NOTE — Telephone Encounter (Signed)
-----   Message from Star Age, MD sent at 04/19/2019  8:39 AM EST ----- Patient referred by Dr. Kae Heller with gen. Surgery and bar. surgery, seen by me on 02/03/19, diagnostic PSG on 04/07/19.    Please call and notify the patient that the recent sleep study showed obstructive sleep apnea. OSA is overall mild, but nearly severe during dream/REM sleep. It is worth treating to see if she feels better after treatment and in light of her bariatric surgery. To that end I recommend treatment for this in the form of autoPAP, which means, that we don't have to bring her back for a second sleep study with CPAP, but will let him try an autoPAP machine at home, through a DME company (of her choice, or as per insurance requirement). The DME representative will educate her on how to use the machine, how to put the mask on, etc. I have placed an order in the chart. Please send referral, talk to patient, send report to referring MD. We will need a FU in sleep clinic for 10 weeks post-PAP set up, please arrange that with me or one of our NPs. Thanks,   Star Age, MD, PhD [facility])

## 2019-04-21 NOTE — Telephone Encounter (Signed)
I reached out to the pt and she and I discussed the recent sleep study result.  Pt was agreeable to trying auto pap and will use [employer] as her DME.  She understands once she is started on the machine to use the machine at least 4 hours every night and to f/u with [place] on 07/20/2019 at 11.  Order has been mailed to the pt and letter mailed to the pt.

## 2019-05-09 ENCOUNTER — Other Ambulatory Visit: Payer: Self-pay

## 2019-05-09 ENCOUNTER — Encounter: Payer: Medicare HMO | Attending: Surgery | Admitting: Skilled Nursing Facility1

## 2019-05-09 DIAGNOSIS — Z6841 Body Mass Index (BMI) 40.0 and over, adult: Secondary | ICD-10-CM | POA: Insufficient documentation

## 2019-05-09 DIAGNOSIS — E669 Obesity, unspecified: Secondary | ICD-10-CM

## 2019-05-09 DIAGNOSIS — Z713 Dietary counseling and surveillance: Secondary | ICD-10-CM | POA: Diagnosis not present

## 2019-05-09 NOTE — Progress Notes (Signed)
Bariatric Nutrition Appointment   Assessment  Planned Surgery: Sleeve no date  According to pts referral, surgeon would like pt to work with dietitian for 3-6 months and lose 50 pounds before surgery.   Patient reports doing exercise, drinking protein. Gym Mon-Fri. Pt reports that it has become easy to carry on goals day to day. Feels good, more energy, and can feel the weight loss. Very optimistic. Walks 40 minutes at a time.  Start weight at [facility]: 411.3 lbs (01/26/2018) Weight today: 367 lbs (-maintained weight)  BMI: 59.04  Lifestyle & Nutrition Hx   Pt reports family issues, noting this is related to high blood pressure. Pt reports not exercising and eating extra due to stress. Pt reports she has found housing for herself, noting she will live alone. She reports she will be able to monitor portion size better.  She notes she will begin new exercise called [employer]. Pt reports leg pain has improved and will begin using the pool next month. Pt reports exercising Monday through Friday. Pt is getting a CPAP machine due to newly diagnosed sleep apnea.   24-Hr Dietary Recall B ( 4AM): one pack grits 3 sausage links Snk ( AM): usually none  L ( 11 AM):salad lettuce carrots cucumber 2 Tbsp mayonnaise Snk ( PM): usually none  D ( PM): salad lettuce carrots cucumber 2 Tbsp mayonnaise Snk ( PM): usually none  Beverages: 96oz bottled, Kool-aid with water  Usual physical activity: exercise at [facility] Monday through Friday  Estimated energy needs:  1600 calories 180 g carbohydrates 120 g protein 44 g fat   Nutrition Diagnosis:  -3.3 Overweight/obesity related to past poor dietary habits and physical inactivity as evidenced by patient w/ planned Sleeve Gastrectomy surgery following dietary guidelines for continued weight loss.    Nutrition Intervention:   Nutrition counseling for upcoming Bariatric Surgery. Goals: -Continue to limit rice, corn, and potatoes to half a  cup -Continue to use less butter -Continue to have carrots with sandwiches  -Continue to drink lots of water  -Keep up the physical activity! -Working on not drinking any fluids with meals -Try various protein drinks/powders: no will buy to take after surgery -Continue to cut back to one packet of grits: goal met -when you eat mac n cheese or pasta do not eat bread -Do arm chair excises 3 times a day    Teaching Method Utilized: Visual, Auditory, Hands on Barriers to learning/adherence to lifestyle change: none identified  Demonstrated degree of understanding via:  teach back    Monitoring/Evaluation:   Dietary intake, exercise,and body weight in 1 month.

## 2019-05-21 ENCOUNTER — Other Ambulatory Visit: Payer: Self-pay

## 2019-05-21 ENCOUNTER — Ambulatory Visit (HOSPITAL_COMMUNITY)
Admission: EM | Admit: 2019-05-21 | Discharge: 2019-05-21 | Disposition: A | Discharge status: Home / Self Care | Payer: Medicare HMO | Attending: Urgent Care | Admitting: Urgent Care

## 2019-05-21 ENCOUNTER — Encounter (HOSPITAL_COMMUNITY): Payer: Self-pay

## 2019-05-21 DIAGNOSIS — Z6841 Body Mass Index (BMI) 40.0 and over, adult: Secondary | ICD-10-CM | POA: Diagnosis not present

## 2019-05-21 DIAGNOSIS — M79605 Pain in left leg: Secondary | ICD-10-CM

## 2019-05-21 DIAGNOSIS — Z131 Encounter for screening for diabetes mellitus: Secondary | ICD-10-CM

## 2019-05-21 DIAGNOSIS — M1712 Unilateral primary osteoarthritis, left knee: Secondary | ICD-10-CM

## 2019-05-21 DIAGNOSIS — M1711 Unilateral primary osteoarthritis, right knee: Secondary | ICD-10-CM

## 2019-05-21 DIAGNOSIS — M79652 Pain in left thigh: Secondary | ICD-10-CM

## 2019-05-21 LAB — CBG MONITORING, ED: Glucose-Capillary: 92 mg/dL (ref 70–99)

## 2019-05-21 LAB — GLUCOSE, CAPILLARY: Glucose-Capillary: 92 mg/dL (ref 70–99)

## 2019-05-21 MED ORDER — HYDROXYZINE HCL 25 MG PO TABS: 25.0000 mg | ORAL_TABLET | Freq: Every evening | ORAL | 0 refills | Status: DC | PRN

## 2019-05-21 MED ORDER — PREDNISONE 20 MG PO TABS: ORAL_TABLET | ORAL | 0 refills | Status: DC

## 2019-05-21 NOTE — Discharge Instructions (Signed)
Make sure you follow-up with your orthopedist given your history of arthritis of your knee needing knee replacement.  Talk with your regular doctor about whether or not they want to pursue an ultrasound to rule out a blood clot.  Otherwise, we will use a steroid course to help address an inflammatory process related to your knee arthritis.  You can use ice 20 minutes every 2 hours as needed.

## 2019-05-21 NOTE — ED Triage Notes (Signed)
Pt c/o 8/10 sharp pain in left legx1 mo. Pt states it's painful to put pressure on leg and walk on it. Pt denies injury to leg. Pt walked to exam room well.

## 2019-05-21 NOTE — ED Provider Notes (Signed)
[place]   MRN: [id number] DOB: 15-Mar-1960  Subjective:   Carla Sloan is a 59 y.o. female presenting for 1 month history of progressively worsening recurrent left knee pain, left thigh pain and left lower lateral leg pain.  Patient reports that she needs bilateral knee replacements.  Unfortunately this has not been scheduled by her orthopedist due to her morbid obesity, has been told that she needs to lose at least under 20 pounds prior to having her surgery.  Patient has been using Tylenol with minimal relief.  Denies fever, redness, warmth, swelling, history of clot, history of diabetes, trauma, falls.  She is had increasing difficulty bearing weight on the left leg over the past month.  No current facility-administered medications for this encounter.  Current Outpatient Medications:  .  amLODipine (NORVASC) 10 MG tablet, Take 10 mg by mouth daily., Disp: , Rfl:  .  bimatoprost (LUMIGAN) 0.03 % ophthalmic solution, 1 drop., Disp: , Rfl:  .  brimonidine-timolol (COMBIGAN) 0.2-0.5 % ophthalmic solution, Place 1 drop into both eyes every 12 (twelve) hours., Disp: , Rfl:  .  citalopram (CELEXA) 40 MG tablet, Take 40 mg by mouth at bedtime., Disp: , Rfl:  .  cyclobenzaprine (FLEXERIL) 5 MG tablet, , Disp: , Rfl:  .  diclofenac (VOLTAREN) 75 MG EC tablet, Take 75 mg by mouth 2 (two) times daily., Disp: , Rfl:  .  diphenhydrAMINE (BENADRYL) 25 MG tablet, Take 25 mg by mouth every 6 (six) hours as needed for itching or allergies., Disp: , Rfl:  .  ergocalciferol (VITAMIN D2) 1.25 MG (50000 UT) capsule, Take 50,000 Units by mouth once a week., Disp: , Rfl:  .  gabapentin (NEURONTIN) 400 MG capsule, Take 400-800 mg by mouth 2 (two) times daily. 1 cap in the morning and 2 cap at night, Disp: , Rfl:  .  hydrochlorothiazide (HYDRODIURIL) 25 MG tablet, Take 25 mg by mouth every morning. , Disp: , Rfl:  .  hydrOXYzine (VISTARIL) 100 MG capsule, Take 100 mg by mouth., Disp: , Rfl:    .  Lurasidone HCl (LATUDA) 60 MG TABS, Take by mouth at bedtime., Disp: , Rfl:  .  tinidazole (TINDAMAX) 500 MG tablet, Take 4 tablets (2,000 mg total) by mouth daily with breakfast., Disp: 8 tablet, Rfl: 0   No Known Allergies  Past Medical History:  Diagnosis Date  . Arthritis   . Depression   . Hypertension   . Muscle spasms of lower extremity    and back     Past Surgical History:  Procedure Laterality Date  . COLONOSCOPY WITH PROPOFOL N/A 09/24/2012   Procedure: COLONOSCOPY WITH PROPOFOL;  Surgeon: Jeryl Columbia, MD;  Location: [facility] ENDOSCOPY;  Service: Endoscopy;  Laterality: N/A;  . COLONOSCOPY WITH PROPOFOL N/A 05/19/2016   Procedure: COLONOSCOPY WITH PROPOFOL;  Surgeon: Clarene Essex, MD;  Location: [facility] ENDOSCOPY;  Service: Endoscopy;  Laterality: N/A;  . NO PAST SURGERIES      Family History  Problem Relation Age of Onset  . Hypertension Mother   . Diabetes Mother   . Breast cancer Mother   . Diabetes Father   . Hypertension Father     Social History   Tobacco Use  . Smoking status: Never Smoker  . Smokeless tobacco: Never Used  Substance Use Topics  . Alcohol use: No  . Drug use: No    ROS   Objective:   Vitals: BP (!) 106/59   Pulse 87   Temp 97.9 F (  36.6 C) (Oral)   Resp 16   Ht 5\' 6"  (1.676 m)   Wt (!) 362 lb (164.2 kg)   SpO2 98%   BMI 58.43 kg/m   Physical Exam Constitutional:      General: She is not in acute distress.    Appearance: Normal appearance. She is well-developed. She is obese. She is not ill-appearing, toxic-appearing or diaphoretic.  HENT:     Head: Normocephalic and atraumatic.     Nose: Nose normal.     Mouth/Throat:     Mouth: Mucous membranes are moist.     Pharynx: Oropharynx is clear.  Eyes:     General: No scleral icterus.    Extraocular Movements: Extraocular movements intact.     Pupils: Pupils are equal, round, and reactive to light.  Cardiovascular:     Rate and Rhythm: Normal rate.  Pulmonary:     Effort:  Pulmonary effort is normal.  Musculoskeletal:     Left upper leg: No swelling, edema, deformity, lacerations, tenderness or bony tenderness.     Left knee: No swelling, deformity, effusion, erythema, ecchymosis, lacerations, bony tenderness or crepitus. Normal range of motion. No tenderness. Normal alignment and normal patellar mobility.     Left lower leg: No swelling, deformity, lacerations, tenderness or bony tenderness. No edema.       Legs:  Skin:    General: Skin is warm and dry.  Neurological:     General: No focal deficit present.     Mental Status: She is alert and oriented to person, place, and time.  Psychiatric:        Mood and Affect: Mood normal.        Behavior: Behavior normal.        Thought Content: Thought content normal.        Judgment: Judgment normal.    Results for orders placed or performed during the hospital encounter of 05/21/19 (from the past 24 hour(s))  POC CBG monitoring     Status: None   Collection Time: 05/21/19 12:20 PM  Result Value Ref Range   Glucose-Capillary 92 70 - 99 mg/dL  Glucose, capillary     Status: None   Collection Time: 05/21/19 12:20 PM  Result Value Ref Range   Glucose-Capillary 92 70 - 99 mg/dL   Assessment and Plan :   1. Osteoarthritis of left knee, unspecified osteoarthritis type   2. Left leg pain   3. Left thigh pain   4. Osteoarthritis of right knee, unspecified osteoarthritis type     Start is on course to address inflammatory process related to her significant knee arthritis.  Recommended she continue Tylenol and use icing.  Follow-up with orthopedist soon as possible.  Check in with PCP to see if they want to pursue ultrasound to rule out blood clot.  Low suspicion for this given negative history of blood clot, no physical exam findings supporting this as a possibility. Counseled patient on potential for adverse effects with medications prescribed/recommended today, ER and return-to-clinic precautions discussed,  patient verbalized understanding.   At the end of her visit, patient requested script to help her get sleep. I will use hydroxyzine for insomnia. Recheck with PCP.    Jaynee Eagles, PA-C 05/21/19 1234

## 2019-05-23 ENCOUNTER — Telehealth: Payer: Self-pay | Admitting: Neurology

## 2019-05-23 NOTE — Telephone Encounter (Signed)
I called pt. I reminded her that she should use her cpap at least 4 hours every night. If she naps she should use it then too. She reports that her sleep is fragmented because she has to get up and take her daughter to work. I advised her to use it whenever she sleeps and try to get at least 4 hours with it each night. Pt verbalized understanding.

## 2019-05-23 NOTE — Telephone Encounter (Signed)
Pt is asking for a call from RN to discuss just how long she is supposed to sleep under her machine nightly.  Please call

## 2019-06-06 ENCOUNTER — Encounter: Payer: Medicare HMO | Attending: Surgery | Admitting: Skilled Nursing Facility1

## 2019-06-06 ENCOUNTER — Other Ambulatory Visit: Payer: Self-pay

## 2019-06-06 DIAGNOSIS — Z713 Dietary counseling and surveillance: Secondary | ICD-10-CM | POA: Insufficient documentation

## 2019-06-06 DIAGNOSIS — E669 Obesity, unspecified: Secondary | ICD-10-CM

## 2019-06-06 DIAGNOSIS — Z6841 Body Mass Index (BMI) 40.0 and over, adult: Secondary | ICD-10-CM | POA: Diagnosis not present

## 2019-06-06 NOTE — Progress Notes (Signed)
Bariatric Nutrition Appointment   Assessment  Planned Surgery: Sleeve no date  According to pts referral, surgeon would like pt to work with dietitian for 3-6 months and lose 50 pounds before surgery.   Patient reports doing exercise, drinking protein. Gym Mon-Fri. Pt reports that it has become easy to carry on goals day to day. Feels good, more energy, and can feel the weight loss. Very optimistic. Walks 40 minutes at a time.  Start weight at [facility]: 411.3 lbs (01/26/2018) Weight today: 368 lbs  BMI: 59.40  Lifestyle & Nutrition Hx   Pt arrives having gained about 1 pounds but know she will get it back off now that she lives alone. Pt states she has her own apartment now and love living by herself. Pt states she started doing pool exercise which she enjoys.   24-Hr Dietary Recall B ( 4AM): one pack grits 3 sausage links or frozen french toast (3 strips) and orange juice Snk ( AM): usually none  L ( 11 AM):salad lettuce carrots cucumber 2 Tbsp mayonnaise Snk ( PM): usually none  D ( PM): canned collard greens with [place] wing + potato + corn bread  Snk ( PM): usually none  Beverages: 96oz bottled, Kool-aid with water  Usual physical activity: exercise at [facility] Monday through Friday  Estimated energy needs:  1600 calories 180 g carbohydrates 120 g protein 44 g fat   Nutrition Diagnosis:  [place]-3.3 Overweight/obesity related to past poor dietary habits and physical inactivity as evidenced by patient w/ planned Sleeve Gastrectomy surgery following dietary guidelines for continued weight loss.    Nutrition Intervention:   Nutrition counseling for upcoming Bariatric Surgery. Goals: -Continue to limit rice, corn, and potatoes to half a cup -Continue to use less butter -Continue to have carrots with sandwiches  -Continue to drink lots of water  -Keep up the physical activity! -Working on not drinking any fluids with meals -Try various protein drinks/powders: no will buy to take  after surgery -Continue to cut back to one packet of grits: goal met -when you eat mac n cheese or pasta do not eat bread -Do arm chair excises 3 times a day    Teaching Method Utilized: Visual, Auditory, Hands on Barriers to learning/adherence to lifestyle change: none identified  Demonstrated degree of understanding via:  teach back    Monitoring/Evaluation:   Dietary intake, exercise,and body weight in 1 month.

## 2019-07-04 ENCOUNTER — Other Ambulatory Visit: Payer: Self-pay

## 2019-07-04 ENCOUNTER — Encounter: Payer: Medicare HMO | Attending: Surgery | Admitting: Skilled Nursing Facility1

## 2019-07-04 DIAGNOSIS — Z713 Dietary counseling and surveillance: Secondary | ICD-10-CM | POA: Insufficient documentation

## 2019-07-04 DIAGNOSIS — E669 Obesity, unspecified: Secondary | ICD-10-CM

## 2019-07-04 DIAGNOSIS — Z6841 Body Mass Index (BMI) 40.0 and over, adult: Secondary | ICD-10-CM | POA: Diagnosis not present

## 2019-07-04 NOTE — Progress Notes (Signed)
Bariatric Nutrition Appointment   Assessment  Planned Surgery: Sleeve    Patient reports doing exercise, drinking protein. Gym Mon-Fri. Pt reports that it has become easy to carry on goals day to day. Feels good, more energy, and can feel the weight loss. Very optimistic. Walks 40 minutes at a time.  Start weight at [facility]: 411.3 lbs (01/26/2018) Weight today: 363.3 lbs  BMI: 58.64  Lifestyle & Nutrition Hx    Pt arrives having lost about 5 pounds.  Pt state she loves her water aerobics. Pt is doing great!  24-Hr Dietary Recall B ( 4AM): one pack grits 3 sausage links or frozen french toast (3 strips) and orange juice Snk ( AM): usually none  L ( 11 AM):salad lettuce carrots cucumber 2 Tbsp mayonnaise Snk ( PM): usually none  D ( PM): canned collard greens with [place] wing + potato + corn bread  Snk ( PM): usually none  Beverages: 96oz bottled, Kool-aid with water  Usual physical activity: exercise at [facility] Monday through Friday  Estimated energy needs:  1600 calories 180 g carbohydrates 120 g protein 44 g fat   Nutrition Diagnosis:  [place]-3.3 Overweight/obesity related to past poor dietary habits and physical inactivity as evidenced by patient w/ planned Sleeve Gastrectomy surgery following dietary guidelines for continued weight loss.    Nutrition Intervention:   Nutrition counseling for upcoming Bariatric Surgery. Goals: -Continue to limit rice, corn, and potatoes to half a cup -Continue to use less butter -Continue to have carrots with sandwiches  -Continue to drink lots of water  -Keep up the physical activity! -Working on not drinking any fluids with meals -Try various protein drinks/powders: no will buy to take after surgery -Continue to cut back to one packet of grits: goal met -when you eat mac n cheese or pasta do not eat bread -Do arm chair excises 3 times a day  -Keep up your workouts!   Teaching Method Utilized: Visual, Auditory, Hands on Barriers  to learning/adherence to lifestyle change: none identified  Demonstrated degree of understanding via:  teach back    Monitoring/Evaluation:   Dietary intake, exercise,and body

## 2019-07-13 ENCOUNTER — Encounter: Payer: Self-pay | Admitting: Adult Health

## 2019-07-13 ENCOUNTER — Other Ambulatory Visit: Payer: Self-pay

## 2019-07-13 ENCOUNTER — Ambulatory Visit (INDEPENDENT_AMBULATORY_CARE_PROVIDER_SITE_OTHER): Payer: Medicare HMO | Admitting: Adult Health

## 2019-07-13 VITALS — BP 156/87 | HR 78 | Ht 66.0 in | Wt 371.0 lb

## 2019-07-13 DIAGNOSIS — Z9989 Dependence on other enabling machines and devices: Secondary | ICD-10-CM

## 2019-07-13 DIAGNOSIS — G4733 Obstructive sleep apnea (adult) (pediatric): Secondary | ICD-10-CM | POA: Diagnosis not present

## 2019-07-13 NOTE — Progress Notes (Addendum)
[facility] [address] [place]. [place] [address] [phone]       OFFICE FOLLOW UP NOTE  [name] Date of Birth:  29-Jun-1960 Medical Record Number:  [id number]   Reason for visit: Initial CPAP visit [facility] provider: Dr. Rexene Alberts    SUBJECTIVE:   CHIEF COMPLAINT:  Chief Complaint  Patient presents with  . Follow-up    Rm here for a cpap f/u. Pt saiys its going well ans ptis alone    HPI:   [name] is a 59 year old female with underlying medical history of HTN, glaucoma, depression, arthritis and morbid obesity who was initially evaluated by Dr. Rexene Alberts for possible sleep apnea.  She underwent split-night study on 04/07/2019 which showed overall mild OSA but near severe OSA in rem sleep with total AHI of 7/h, REM AHI of 29.7/h and O2 nadir of 67%.  Recommended treatment with CPAP for OSA management.  Today, 07/13/2019, Carla Sloan returns for initial CPAP compliance visit.  Compliance visit reviewed from 06/12/2019 -07/11/2019 which showed 30 out of 30 usage days with 29 days greater than 4 hours for 97% compliance.  Average usage 4 hours and 33 minutes with residual AHI 0.3.  Leaks in the 95th percentile 7.9 L/min.  Pressure in the 95th percentile 7.1 cm H2O with min pressure 6 and max pressure 12 and EPR level 3.  Reports good tolerance of CPAP without any concerns Continues to follow with nutrition and diabetes education services with continued weight loss and plans for sleeve gastrectomy surgery No concerns     ROS:   14 system review of systems performed and negative with exception of no complaints    Epworth Sleepiness Scale How likely are you to doze in the following situations:  0 = not likely, 1 = slight chance, 2 = moderate chance, 3 = high chance   Sitting and Reading? 0 Watching Television? 1 Sitting inactive in a public place (theater or meeting)? 0 Lying down in the afternoon when circumstances permit?  1 Sitting and talking to someone? 0 Sitting quietly after lunch without alcohol? 0 In a car, while stopped for a few minutes in traffic? 0 As a passenger in a car for an hour without a break? 0  Total = 2    PMH:  Past Medical History:  Diagnosis Date  . Arthritis   . Depression   . Hypertension   . Muscle spasms of lower extremity    and back    PSH:  Past Surgical History:  Procedure Laterality Date  . COLONOSCOPY WITH PROPOFOL N/A 09/24/2012   Procedure: COLONOSCOPY WITH PROPOFOL;  Surgeon: Jeryl Columbia, MD;  Location: [facility] ENDOSCOPY;  Service: Endoscopy;  Laterality: N/A;  . COLONOSCOPY WITH PROPOFOL N/A 05/19/2016   Procedure: COLONOSCOPY WITH PROPOFOL;  Surgeon: Clarene Essex, MD;  Location: [facility] ENDOSCOPY;  Service: Endoscopy;  Laterality: N/A;  . NO PAST SURGERIES      Social History:  Social History   Socioeconomic History  . Marital status: Single    Spouse name: Not on file  . Number of children: Not on file  . Years of education: Not on file  . Highest education level: Not on file  Occupational History  . Not on file  Tobacco Use  . Smoking status: Never Smoker  . Smokeless tobacco: Never Used  Substance and Sexual Activity  . Alcohol use: No  . Drug use: No  . Sexual activity: Yes    Birth control/protection: None  Other Topics Concern  . Not on file  Social History Narrative  . Not on file   Social Determinants of Health   Financial Resource Strain:   . Difficulty of Paying Living Expenses:   Food Insecurity:   . Worried About Charity fundraiser in the Last Year:   . Arboriculturist in the Last Year:   Transportation Needs:   . Film/video editor (Medical):   Marland Kitchen Lack of Transportation (Non-Medical):   Physical Activity:   . Days of Exercise per Week:   . Minutes of Exercise per Session:   Stress:   . Feeling of Stress :   Social Connections:   . Frequency of Communication with Friends and Family:   . Frequency of Social Gatherings with  Friends and Family:   . Attends Religious Services:   . Active Member of Clubs or Organizations:   . Attends Archivist Meetings:   Marland Kitchen Marital Status:   Intimate Partner Violence:   . Fear of Current or Ex-Partner:   . Emotionally Abused:   Marland Kitchen Physically Abused:   . Sexually Abused:     Family History:  Family History  Problem Relation Age of Onset  . Hypertension Mother   . Diabetes Mother   . Breast cancer Mother   . Diabetes Father   . Hypertension Father     Medications:   Current Outpatient Medications on File Prior to Visit  Medication Sig Dispense Refill  . amLODipine (NORVASC) 10 MG tablet Take 10 mg by mouth daily.    . bimatoprost (LUMIGAN) 0.03 % ophthalmic solution 1 drop.    . brimonidine-timolol (COMBIGAN) 0.2-0.5 % ophthalmic solution Place 1 drop into both eyes every 12 (twelve) hours.    . citalopram (CELEXA) 40 MG tablet Take 40 mg by mouth at bedtime.    . cyclobenzaprine (FLEXERIL) 5 MG tablet     . diclofenac (VOLTAREN) 75 MG EC tablet Take 75 mg by mouth 2 (two) times daily.    . diphenhydrAMINE (BENADRYL) 25 MG tablet Take 25 mg by mouth every 6 (six) hours as needed for itching or allergies.    Marland Kitchen ergocalciferol (VITAMIN D2) 1.25 MG (50000 UT) capsule Take 50,000 Units by mouth once a week.    . gabapentin (NEURONTIN) 400 MG capsule Take 400-800 mg by mouth 2 (two) times daily. 1 cap in the morning and 2 cap at night    . hydrochlorothiazide (HYDRODIURIL) 25 MG tablet Take 25 mg by mouth every morning.     . hydrOXYzine (ATARAX/VISTARIL) 25 MG tablet Take 1-2 tablets (25-50 mg total) by mouth at bedtime as needed. Take for insomnia. 60 tablet 0  . hydrOXYzine (VISTARIL) 100 MG capsule Take 100 mg by mouth.    . Lurasidone HCl (LATUDA) 60 MG TABS Take by mouth at bedtime.    . predniSONE (DELTASONE) 20 MG tablet Take 2 tablets daily with breakfast. 10 tablet 0  . tinidazole (TINDAMAX) 500 MG tablet Take 4 tablets (2,000 mg total) by mouth daily  with breakfast. 8 tablet 0   No current facility-administered medications on file prior to visit.    Allergies:  No Known Allergies    OBJECTIVE:  Physical Exam  Vitals:   07/13/19 0822  BP: (!) 156/87  Pulse: 78  Weight: (!) 371 lb (168.3 kg)  Height: 5\' 6"  (1.676 m)   Body mass index is 59.88 kg/m. No exam data present  General: Morbidly obese pleasant middle-aged African-American female, seated,  in no evident distress Head: head normocephalic and atraumatic.   Neck: supple with no carotid or supraclavicular bruits; neck circumference 15" Cardiovascular: regular rate and rhythm, no murmurs Musculoskeletal: no deformity Skin:  no rash/petichiae Vascular:  Normal pulses all extremities   Neurologic Exam Mental Status: Awake and fully alert. Oriented to place and time. Recent and remote memory intact. Attention span, concentration and fund of knowledge appropriate. Mood and affect appropriate.  Cranial Nerves: Pupils equal, briskly reactive to light. Extraocular movements full without nystagmus. Visual fields full to confrontation. Hearing intact. Facial sensation intact. Face, tongue, palate moves normally and symmetrically.  Motor: Normal bulk and tone. Normal strength in all tested extremity muscles. Sensory.: intact to touch , pinprick , position and vibratory sensation.  Coordination: Rapid alternating movements normal in all extremities. Finger-to-nose and heel-to-shin performed accurately bilaterally. Gait and Station: Arises from chair without difficulty. Stance is normal. Gait demonstrates normal stride length and balance Reflexes: 1+ and symmetric. Toes downgoing.       ASSESSMENT: Carla Sloan is a 59 y.o. year old female with underlying medical history of HTN, glaucoma, depression, arthritis, morbid obesity with BMI > 60 and recent diagnosis of obstructive sleep apnea on 04/07/2019 with initiation of CPAP.     PLAN:  Continue current settings  on CPAP and continue excellent compliance. Continue to follow with [employer] for any needed supplies or concerns.  Highly encouraged continued weight loss goals   Follow up in 6 months or call earlier if needed   I spent 22 minutes of face-to-face and non-face-to-face time with patient.  This included previsit chart review, lab review, study review, order entry, electronic health record documentation, patient education    Frann Rider, [facility]  [facility] [address] [place], [place] [address]  Phone [phone] Fax [phone] Note: This document was prepared with digital dictation and possible smart phrase technology. Any transcriptional errors that result from this process are unintentional.   I reviewed the above note and documentation by the Nurse Practitioner and agree with the history, exam, assessment and plan as outlined above. I was available for consultation. Star Age, MD, PhD [facility])

## 2019-07-13 NOTE — Patient Instructions (Signed)
Your Plan:  Great compliance with CPAP -continue current settings without changes  Continue to follow with your DME company for any needed supplies or CPAP concerns  Follow-up in 6 months or call earlier if needed     Thank you for coming to see [place] at [facility]. I hope we have been able to provide you high quality care today.  You may receive a patient satisfaction survey over the next few weeks. We would appreciate your feedback and comments so that we may continue to improve ourselves and the health of our patients.

## 2019-07-14 ENCOUNTER — Ambulatory Visit
Admission: RE | Admit: 2019-07-14 | Discharge: 2019-07-14 | Disposition: A | Discharge status: Home / Self Care | Payer: Medicare HMO | Source: Ambulatory Visit | Attending: Internal Medicine | Admitting: Internal Medicine

## 2019-07-14 ENCOUNTER — Other Ambulatory Visit: Payer: Self-pay

## 2019-07-14 DIAGNOSIS — Z1231 Encounter for screening mammogram for malignant neoplasm of breast: Secondary | ICD-10-CM

## 2019-07-20 ENCOUNTER — Ambulatory Visit: Payer: Self-pay | Admitting: Neurology

## 2019-09-20 ENCOUNTER — Encounter: Payer: Self-pay | Admitting: Obstetrics

## 2019-09-20 ENCOUNTER — Other Ambulatory Visit (HOSPITAL_COMMUNITY)
Admission: RE | Admit: 2019-09-20 | Discharge: 2019-09-20 | Disposition: A | Discharge status: Home / Self Care | Payer: Medicare HMO | Source: Ambulatory Visit | Attending: Obstetrics | Admitting: Obstetrics

## 2019-09-20 ENCOUNTER — Ambulatory Visit (INDEPENDENT_AMBULATORY_CARE_PROVIDER_SITE_OTHER): Payer: Medicare HMO | Admitting: Obstetrics

## 2019-09-20 ENCOUNTER — Other Ambulatory Visit: Payer: Self-pay

## 2019-09-20 VITALS — BP 128/78 | HR 81 | Ht 66.0 in | Wt 376.5 lb

## 2019-09-20 DIAGNOSIS — Z780 Asymptomatic menopausal state: Secondary | ICD-10-CM

## 2019-09-20 DIAGNOSIS — Z1151 Encounter for screening for human papillomavirus (HPV): Secondary | ICD-10-CM | POA: Insufficient documentation

## 2019-09-20 DIAGNOSIS — Z6841 Body Mass Index (BMI) 40.0 and over, adult: Secondary | ICD-10-CM

## 2019-09-20 DIAGNOSIS — N898 Other specified noninflammatory disorders of vagina: Secondary | ICD-10-CM | POA: Insufficient documentation

## 2019-09-20 DIAGNOSIS — Z113 Encounter for screening for infections with a predominantly sexual mode of transmission: Secondary | ICD-10-CM | POA: Diagnosis not present

## 2019-09-20 DIAGNOSIS — Z01419 Encounter for gynecological examination (general) (routine) without abnormal findings: Secondary | ICD-10-CM | POA: Insufficient documentation

## 2019-09-20 NOTE — Progress Notes (Addendum)
Subjective:        Carla Sloan is a 59 y.o. female here for a routine exam.  Current complaints: None.    Personal health questionnaire:  Is patient Carla Sloan, have a family history of breast and/or ovarian cancer: no Is there a family history of uterine cancer diagnosed at age < 63, gastrointestinal cancer, urinary tract cancer, family member who is a [name]: no Is the patient overweight and hypertensive, family history of diabetes, personal history of gestational diabetes, preeclampsia or PCOS: yes Is patient over 108, have PCOS,  family history of premature CHD under age 73, diabetes, smoke, have hypertension or peripheral artery disease:  no At any time, has a partner hit, kicked or otherwise hurt or frightened you?: no Over the past 2 weeks, have you felt down, depressed or hopeless?: no Over the past 2 weeks, have you felt little interest or pleasure in doing things?:no   Gynecologic History Patient's last menstrual period was 09/25/2015 (approximate). Contraception: post menopausal status Last Pap: 09-02-2018. Results were: normal Last mammogram: 07-14-2019. Results were: normal  Obstetric History OB History  Gravida Para Term Preterm AB Living  2 2 2     2   SAB TAB Ectopic Multiple Live Births          2    # Outcome Date GA Lbr Len/2nd Weight Sex Delivery Anes PTL Lv  2 Term 12/09/93 [redacted]w[redacted]d  8 lb 6 oz (3.799 kg) M Vag-Spont EPI  LIV  1 Term 10/26/90 [redacted]w[redacted]d  6 lb 6 oz (2.892 kg) F Vag-Spont EPI  LIV    Past Medical History:  Diagnosis Date  . Arthritis   . Depression   . Hypertension   . Muscle spasms of lower extremity    and back    Past Surgical History:  Procedure Laterality Date  . COLONOSCOPY WITH PROPOFOL N/A 09/24/2012   Procedure: COLONOSCOPY WITH PROPOFOL;  Surgeon: Jeryl Columbia, MD;  Location: [facility] ENDOSCOPY;  Service: Endoscopy;  Laterality: N/A;  . COLONOSCOPY WITH PROPOFOL N/A 05/19/2016   Procedure:  COLONOSCOPY WITH PROPOFOL;  Surgeon: Clarene Essex, MD;  [facility];  Service: Endoscopy;  Laterality: N/A;  . NO PAST SURGERIES       Current Outpatient Medications:  .  amLODipine (NORVASC) 10 MG tablet, Take 10 mg by mouth daily., Disp: , Rfl:  .  bimatoprost (LUMIGAN) 0.03 % ophthalmic solution, 1 drop., Disp: , Rfl:  .  brimonidine-timolol (COMBIGAN) 0.2-0.5 % ophthalmic solution, Place 1 drop into both eyes every 12 (twelve) hours., Disp: , Rfl:  .  citalopram (CELEXA) 40 MG tablet, Take 40 mg by mouth at bedtime., Disp: , Rfl:  .  cyclobenzaprine (FLEXERIL) 5 MG tablet, , Disp: , Rfl:  .  diphenhydrAMINE (BENADRYL) 25 MG tablet, Take 25 mg by mouth every 6 (six) hours as needed for itching or allergies., Disp: , Rfl:  .  ergocalciferol (VITAMIN D2) 1.25 MG (50000 UT) capsule, Take 50,000 Units by mouth once a week., Disp: , Rfl:  .  gabapentin (NEURONTIN) 400 MG capsule, Take 400-800 mg by mouth 2 (two) times daily. 1 cap in the morning and 2 cap at night, Disp: , Rfl:  .  hydrochlorothiazide (HYDRODIURIL) 25 MG tablet, Take 25 mg by mouth every morning. , Disp: , Rfl:  .  hydrOXYzine (ATARAX/VISTARIL) 25 MG tablet, Take 1-2 tablets (25-50 mg total) by mouth at bedtime as needed. Take for insomnia., Disp: 60 tablet, Rfl: 0 .  Lurasidone HCl (LATUDA) 60 MG TABS, Take by mouth at bedtime., Disp: , Rfl:  .  predniSONE (DELTASONE) 20 MG tablet, Take 2 tablets daily with breakfast., Disp: 10 tablet, Rfl: 0 No Known Allergies  Social History   Tobacco Use  . Smoking status: Never Smoker  . Smokeless tobacco: Never Used  Substance Use Topics  . Alcohol use: No    Family History  Problem Relation Age of Onset  . Hypertension Mother   . Diabetes Mother   . Breast cancer Mother   . Diabetes Father   . Hypertension Father       Review of Systems  Constitutional: negative for fatigue and weight loss Respiratory: negative for cough and wheezing Cardiovascular: negative for  chest pain, fatigue and palpitations Gastrointestinal: negative for abdominal pain and change in bowel habits Musculoskeletal:negative for myalgias Neurological: negative for gait problems and tremors Behavioral/Psych: negative for abusive relationship, depression Endocrine: negative for temperature intolerance    Genitourinary:negative for abnormal menstrual periods, genital lesions, hot flashes, sexual problems and vaginal discharge Integument/breast: negative for breast lump, breast tenderness, nipple discharge and skin lesion(s)    Objective:       BP 128/78   Pulse 81   Ht 5\' 6"  (1.676 m)   Wt (!) 376 lb 8 oz (170.8 kg)   LMP 09/25/2015 (Approximate)   BMI 60.77 kg/m  General:   alert  Skin:   no rash or abnormalities  Lungs:   clear to auscultation bilaterally  Heart:   regular rate and rhythm, S1, S2 normal, no murmur, click, rub or gallop  Breasts:   normal without suspicious masses, skin or nipple changes or axillary nodes  Abdomen:  normal findings: no organomegaly, soft, non-tender and no hernia  Pelvis:  External genitalia: normal general appearance Urinary system: urethral meatus normal and bladder without fullness, nontender Vaginal: normal without tenderness, induration or masses Cervix: normal appearance Adnexa: normal bimanual exam Uterus: anteverted and non-tender, normal size   Lab Review Urine pregnancy test Labs reviewed yes Radiologic studies reviewed yes  50% of 25 min visit spent on counseling and coordination of care.   Assessment:     1. Encounter for gynecological examination with Papanicolaou smear of cervix Rx: - Cytology - PAP( [name])  2. Vaginal discharge Rx: - Cervicovaginal ancillary only( [name])  3. Screen for STD (sexually transmitted disease) Rx: - Hepatitis B surface antigen - Hepatitis C antibody - HIV Antibody (routine testing w rflx) - RPR  4. Postmenopause - doing well clinically  5. Class 3 severe  obesity without serious comorbidity with body mass index (BMI) of 60.0 to 69.9 in adult, unspecified obesity type ([name]) - program of caloric reduction, exercise and behavioral modification recommended    Plan:    Education reviewed: calcium supplements, depression evaluation, low fat, low cholesterol diet, safe sex/STD prevention, self breast exams and weight bearing exercise. Follow up in: 1 year.    Orders Placed This Encounter  Procedures  . Hepatitis B surface antigen  . Hepatitis C antibody  . HIV Antibody (routine testing w rflx)  . RPR    Shelly Bombard, MD 09/20/2019 9:36 AM

## 2019-09-20 NOTE — Progress Notes (Signed)
Patient presents for AEX. Patient has no concerns today. She desires to have STD testing.  Last Pap: 09/02/2018 Normal Last MM: 06/2019 Normal

## 2019-09-21 LAB — CERVICOVAGINAL ANCILLARY ONLY
Bacterial Vaginitis (gardnerella): POSITIVE — AB
Candida Glabrata: NEGATIVE
Candida Vaginitis: NEGATIVE
Chlamydia: NEGATIVE
Comment: NEGATIVE
Comment: NEGATIVE
Comment: NEGATIVE
Comment: NEGATIVE
Comment: NEGATIVE
Comment: NORMAL
Neisseria Gonorrhea: NEGATIVE
Trichomonas: POSITIVE — AB

## 2019-09-21 LAB — HEPATITIS C ANTIBODY: Hep C Virus Ab: <0.1 s/co ratio (ref 0.0–0.9)

## 2019-09-21 LAB — RPR: RPR Ser Ql: NONREACTIVE

## 2019-09-21 LAB — HEPATITIS B SURFACE ANTIGEN: Hepatitis B Surface Ag: NEGATIVE

## 2019-09-21 LAB — HIV ANTIBODY (ROUTINE TESTING W REFLEX): HIV Screen 4th Generation wRfx: NONREACTIVE

## 2019-09-22 ENCOUNTER — Telehealth: Payer: Self-pay

## 2019-09-22 ENCOUNTER — Other Ambulatory Visit: Payer: Self-pay | Admitting: Obstetrics

## 2019-09-22 DIAGNOSIS — A5901 Trichomonal vulvovaginitis: Secondary | ICD-10-CM

## 2019-09-22 DIAGNOSIS — B9689 Other specified bacterial agents as the cause of diseases classified elsewhere: Secondary | ICD-10-CM

## 2019-09-22 LAB — CYTOLOGY - PAP
Comment: NEGATIVE
Diagnosis: NEGATIVE
High risk HPV: NEGATIVE

## 2019-09-22 MED ORDER — METRONIDAZOLE 500 MG PO TABS: 500.0000 mg | ORAL_TABLET | Freq: Two times a day (BID) | ORAL | 2 refills | Status: DC

## 2019-09-22 NOTE — Telephone Encounter (Signed)
-----   Message from Shelly Bombard, MD sent at 09/22/2019 10:42 AM EDT ----- Flagyl Rx for BV and Trichomonas

## 2019-09-22 NOTE — Telephone Encounter (Signed)
TC to pt to make aware of +BV and +Trich and Treatment Rx  Pt voiced understanding and made aware to avoid unprotected intercourse w/ partner and need TOC  And partner will need to go to PCP /urgent care/health Dept for treatment.

## 2019-10-24 ENCOUNTER — Ambulatory Visit (HOSPITAL_COMMUNITY)
Admission: EM | Admit: 2019-10-24 | Discharge: 2019-10-24 | Disposition: A | Discharge status: Home / Self Care | Payer: Medicare HMO | Attending: Family Medicine | Admitting: Family Medicine

## 2019-10-24 ENCOUNTER — Encounter (HOSPITAL_COMMUNITY): Payer: Self-pay

## 2019-10-24 ENCOUNTER — Other Ambulatory Visit: Payer: Self-pay

## 2019-10-24 DIAGNOSIS — M79641 Pain in right hand: Secondary | ICD-10-CM

## 2019-10-24 DIAGNOSIS — G5603 Carpal tunnel syndrome, bilateral upper limbs: Secondary | ICD-10-CM | POA: Diagnosis present

## 2019-10-24 DIAGNOSIS — M79642 Pain in left hand: Secondary | ICD-10-CM | POA: Diagnosis not present

## 2019-10-24 MED ORDER — WRIST SPLINT/COCK-UP/LEFT L MISC: 0 refills | Status: DC

## 2019-10-24 MED ORDER — WRIST SPLINT/COCK-UP/RIGHT L MISC: 0 refills | Status: DC

## 2019-10-24 NOTE — Discharge Instructions (Addendum)
It was wonderful to meet you today.  We will be treating your hand pain as carpal tunnel today with splinting at night and with any activities that seem to make it worse.  If you have any weakness or worsening of pain, please follow-up.  You can use Tylenol and/or ibuprofen to help with any discomfort.  Please keep your follow-up with your PCP this Friday for any further evaluation and continued management of your blood pressure.

## 2019-10-24 NOTE — ED Triage Notes (Addendum)
Pt is here with bilateral hand pain that started a week ago, pt has not taken any meds to relieve discomfort. Pt tested POSITIVE for BV, Trichomonas on 09/20/2019. Pt wants to see  if its gone but is denying ALL sxs.

## 2019-10-24 NOTE — ED Provider Notes (Signed)
[place]    CSN: [id number] Arrival date & time: 10/24/19  0175      History   Chief Complaint Chief Complaint  Patient presents with  . Hand Pain    HPI Carla Sloan is a 59 y.o. female with a history of arthritis and hypertension presenting for evaluation of bilateral hand pain for the past week.   No preceding injury/trauma, noticed insidious hand pain/tingling.  Worse at night, wakes her up and she has to shake out both of her hands.  Feels like it is the whole hand on both sides.  Left worse than right.  No repetitive motion, other than often playing games on her phone which does seem to make it worse.  Denies any associated neck pain or weakness, however due to the new needle-like sensation/occasional numbness has lost her grip occasionally.  No new medications.  Has not had this before.  No paresthesias/tingling elsewhere.  Recently treated BV/trichomonas with Flagyl at the end of July.  No recurrent vaginal itching/irritation or discharge however would like a test of cure.  She has follow-up with her PCP this Friday, 9/3.  Past Medical History:  Diagnosis Date  . Arthritis   . Depression   . Hypertension   . Muscle spasms of lower extremity    and back    There are no problems to display for this patient.   Past Surgical History:  Procedure Laterality Date  . COLONOSCOPY WITH PROPOFOL N/A 09/24/2012   Procedure: COLONOSCOPY WITH PROPOFOL;  Surgeon: Jeryl Columbia, MD;  Location: [facility];  Service: Endoscopy;  Laterality: N/A;  . COLONOSCOPY WITH PROPOFOL N/A 05/19/2016   Procedure: COLONOSCOPY WITH PROPOFOL;  Surgeon: Clarene Essex, MD;  Location: [facility] ENDOSCOPY;  Service: Endoscopy;  Laterality: N/A;  . NO PAST SURGERIES      OB History    Gravida  2   Para  2   Term  2   Preterm      AB      Living  2     SAB      TAB      Ectopic      Multiple      Live Births  2            Home Medications    Prior to  Admission medications   Medication Sig Start Date End Date Taking? Authorizing Provider  amLODipine (NORVASC) 10 MG tablet Take 10 mg by mouth daily.    [provider]  bimatoprost (LUMIGAN) 0.03 % ophthalmic solution 1 drop.    [provider]  brimonidine-timolol (COMBIGAN) 0.2-0.5 % ophthalmic solution Place 1 drop into both eyes every 12 (twelve) hours.    [provider]  citalopram (CELEXA) 40 MG tablet Take 40 mg by mouth at bedtime.    [provider]  cyclobenzaprine (FLEXERIL) 5 MG tablet  06/20/13   [provider]  diphenhydrAMINE (BENADRYL) 25 MG tablet Take 25 mg by mouth every 6 (six) hours as needed for itching or allergies.    [provider]  Elastic Bandages & Supports (WRIST SPLINT/COCK-UP/LEFT L) MISC Wear nightly and with exacerbating activities. 10/24/19   Patriciaann Clan, DO  Elastic Bandages & Supports (WRIST SPLINT/COCK-UP/RIGHT L) MISC Wear nightly and with exacerbating activities. 10/24/19   Patriciaann Clan, DO  ergocalciferol (VITAMIN D2) 1.25 MG (50000 UT) capsule Take 50,000 Units by mouth once a week.    [provider]  gabapentin (NEURONTIN) 400  MG capsule Take 400-800 mg by mouth 2 (two) times daily. 1 cap in the morning and 2 cap at night    [provider]  hydrochlorothiazide (HYDRODIURIL) 25 MG tablet Take 25 mg by mouth every morning.     [provider]  hydrOXYzine (ATARAX/VISTARIL) 25 MG tablet Take 1-2 tablets (25-50 mg total) by mouth at bedtime as needed. Take for insomnia. 05/21/19   Jaynee Eagles, PA-C  Lurasidone HCl (LATUDA) 60 MG TABS Take by mouth at bedtime.    [provider]  metroNIDAZOLE (FLAGYL) 500 MG tablet Take 1 tablet (500 mg total) by mouth 2 (two) times daily. 09/22/19   Shelly Bombard, MD  predniSONE (DELTASONE) 20 MG tablet Take 2 tablets daily with breakfast. 05/21/19   Jaynee Eagles, PA-C    Family History Family History  Problem Relation  Age of Onset  . Hypertension Mother   . Diabetes Mother   . Breast cancer Mother   . Diabetes Father   . Hypertension Father     Social History Social History   Tobacco Use  . Smoking status: Never Smoker  . Smokeless tobacco: Never Used  Vaping Use  . Vaping Use: Never used  Substance Use Topics  . Alcohol use: No  . Drug use: No     Allergies   Patient has no known allergies.   Review of Systems Review of Systems  Constitutional: Negative for chills and fever.  Genitourinary: Negative for dysuria, vaginal bleeding, vaginal discharge and vaginal pain.  Musculoskeletal: Negative for joint swelling, myalgias, neck pain and neck stiffness.  Skin: Negative for rash and wound.  Neurological: Positive for numbness. Negative for dizziness, weakness and light-headedness.    Physical Exam Triage Vital Signs ED Triage Vitals  Enc Vitals Group     BP 10/24/19 1147 (!) 150/68     Pulse Rate 10/24/19 1147 77     Resp 10/24/19 1147 (!) 21     Temp --      Temp Source 10/24/19 1147 Oral     SpO2 10/24/19 1147 97 %     Weight 10/24/19 1146 (!) 380 lb (172.4 kg)     Height --      Head Circumference --      Peak Flow --      Pain Score 10/24/19 1146 0     Pain Loc --      Pain Edu? --      Excl. in [place]? --    No data found.  Updated Vital Signs BP (!) 150/68 (BP Location: Right Arm)   Pulse 77   Resp (!) 21   Wt (!) 172.4 kg   LMP 09/25/2015 (Approximate)   SpO2 97%   BMI 61.33 kg/m    Physical Exam Constitutional:      General: She is not in acute distress.    Appearance: Normal appearance. She is not ill-appearing.  HENT:     Head: Normocephalic and atraumatic.  Neck:     Comments: Full ROM throughout cervical motion.  Negative Spurling's bilaterally. Pulmonary:     Effort: Pulmonary effort is normal.  Musculoskeletal:     Cervical back: Normal range of motion and neck supple. No rigidity or tenderness.     Comments: No overt hand deformity, rash, or  bruising noted bilaterally.  Sensation to light touch intact throughout upper extremity.  5/5 upper extremity strength with finger abduction/abduction, hand grip, thumb opposition, wrist F/E, elbow/ shoulder F/E.  Reverse Phalen test with  reproduction of tingling.  Negative Tinel's at wrist and elbow bilaterally.  Skin:    General: Skin is warm and dry.     Capillary Refill: Capillary refill takes less than 2 seconds.     Findings: No bruising, erythema or rash.     Comments: No thenar atrophy bilaterally  Neurological:     Mental Status: She is alert.  Psychiatric:        Mood and Affect: Mood normal.        Behavior: Behavior normal.     UC Treatments / Results  Labs (all labs ordered are listed, but only abnormal results are displayed) Labs Reviewed  CERVICOVAGINAL ANCILLARY ONLY    EKG   Radiology No results found.  Procedures Procedures (including critical care time)  Medications Ordered in UC Medications - No data to display  Initial Impression / Assessment and Plan / UC Course  I have reviewed the triage vital signs and the nursing notes.  Pertinent labs & imaging results that were available during my care of the patient were reviewed by me and considered in my medical decision making (see chart for details).    59 year old female with hypertension and morbid obesity (BMI 61) presenting for evaluation of bilateral hand paresthesias for the past week.  Clinical presentation consistent with bilateral carpal tunnel syndrome.  No evidence on exam to suggest cervical radiculopathy.  Provided with bilateral wrist braces to wear nightly/aggravating activities and printed Rx for cock-up splints in case as well.  Avoid triggers, especially excessive phone game playing.  Follow-up with PCP already scheduled for 9/3, could consider alternative etiology of paresthesias (T2DM etc.) if not improving with splint placement.  F/U sooner if any associated weakness.  Test of cure  obtained for recent BV/trichomonas, asymptomatic and will follow up results.  Final Clinical Impressions(s) / UC Diagnoses   Final diagnoses:  Bilateral hand pain  Bilateral carpal tunnel syndrome     Discharge Instructions     It was wonderful to meet you today.  We will be treating your hand pain as carpal tunnel today with splinting at night and with any activities that seem to make it worse.  If you have any weakness or worsening of pain, please follow-up.  You can use Tylenol and/or ibuprofen to help with any discomfort.  Please keep your follow-up with your PCP this Friday for any further evaluation and continued management of your blood pressure.     ED Prescriptions    Medication Sig Dispense Auth. Provider   Elastic Bandages & Supports (WRIST SPLINT/COCK-UP/LEFT L) MISC Wear nightly and with exacerbating activities. 1 each Patriciaann Clan, DO   Elastic Bandages & Supports (WRIST SPLINT/COCK-UP/RIGHT L) MISC Wear nightly and with exacerbating activities. 1 each Patriciaann Clan, DO     PDMP not reviewed this encounter.   Patriciaann Clan, DO 10/24/19 1711

## 2019-10-25 ENCOUNTER — Telehealth (HOSPITAL_COMMUNITY): Payer: Self-pay | Admitting: Emergency Medicine

## 2019-10-25 LAB — CERVICOVAGINAL ANCILLARY ONLY
Bacterial Vaginitis (gardnerella): NEGATIVE
Candida Glabrata: NEGATIVE
Candida Vaginitis: POSITIVE — AB
Chlamydia: NEGATIVE
Comment: NEGATIVE
Comment: NEGATIVE
Comment: NEGATIVE
Comment: NEGATIVE
Comment: NEGATIVE
Comment: NORMAL
Neisseria Gonorrhea: NEGATIVE
Trichomonas: NEGATIVE

## 2019-10-25 MED ORDER — FLUCONAZOLE 150 MG PO TABS: 150.0000 mg | ORAL_TABLET | Freq: Once | ORAL | 0 refills | Status: AC

## 2019-10-25 NOTE — Telephone Encounter (Signed)
Patient called for cervicovaginal results.  Positive for yeast, will need treatment with Diflucan, per protocol.  Sent to pharmacy of request.  No questions at this time.

## 2019-11-01 ENCOUNTER — Other Ambulatory Visit: Payer: Self-pay

## 2019-11-01 ENCOUNTER — Ambulatory Visit: Payer: Medicare HMO

## 2019-11-01 DIAGNOSIS — N760 Acute vaginitis: Secondary | ICD-10-CM

## 2019-11-01 NOTE — Progress Notes (Signed)
Patient was tested for Trich. 10/24/2019 at [facility].  Results were NEGATIVE.  She is not having any sx at this time.

## 2019-11-09 ENCOUNTER — Other Ambulatory Visit: Payer: Self-pay | Admitting: Surgery

## 2019-11-09 ENCOUNTER — Other Ambulatory Visit (HOSPITAL_COMMUNITY): Payer: Self-pay | Admitting: Surgery

## 2019-11-23 ENCOUNTER — Other Ambulatory Visit: Payer: Self-pay

## 2019-11-23 ENCOUNTER — Ambulatory Visit (HOSPITAL_COMMUNITY)
Admission: RE | Admit: 2019-11-23 | Discharge: 2019-11-23 | Disposition: A | Discharge status: Home / Self Care | Payer: Medicare HMO | Source: Ambulatory Visit | Attending: Surgery | Admitting: Surgery

## 2019-12-12 ENCOUNTER — Ambulatory Visit (INDEPENDENT_AMBULATORY_CARE_PROVIDER_SITE_OTHER): Payer: Medicare HMO | Admitting: Psychology

## 2019-12-12 DIAGNOSIS — F509 Eating disorder, unspecified: Secondary | ICD-10-CM | POA: Diagnosis not present

## 2019-12-13 ENCOUNTER — Ambulatory Visit: Payer: Self-pay | Admitting: Psychology

## 2019-12-26 ENCOUNTER — Ambulatory Visit: Payer: Medicare HMO | Admitting: Psychology

## 2019-12-29 ENCOUNTER — Ambulatory Visit (INDEPENDENT_AMBULATORY_CARE_PROVIDER_SITE_OTHER): Payer: Medicare HMO | Admitting: Psychology

## 2019-12-29 DIAGNOSIS — F509 Eating disorder, unspecified: Secondary | ICD-10-CM | POA: Diagnosis not present

## 2020-01-17 ENCOUNTER — Ambulatory Visit: Payer: Medicare HMO | Admitting: Adult Health

## 2020-01-18 ENCOUNTER — Telehealth: Payer: Self-pay | Admitting: Skilled Nursing Facility1

## 2020-01-18 NOTE — Telephone Encounter (Signed)
Pt called stating her iron is low.   Dietitian advised pt to eat high iron foods.

## 2020-03-14 ENCOUNTER — Other Ambulatory Visit: Payer: Self-pay | Admitting: Internal Medicine

## 2020-03-14 DIAGNOSIS — Z1231 Encounter for screening mammogram for malignant neoplasm of breast: Secondary | ICD-10-CM

## 2020-03-22 ENCOUNTER — Encounter: Payer: Medicare HMO | Attending: Surgery | Admitting: Skilled Nursing Facility1

## 2020-03-22 ENCOUNTER — Other Ambulatory Visit: Payer: Self-pay

## 2020-03-22 DIAGNOSIS — Z6841 Body Mass Index (BMI) 40.0 and over, adult: Secondary | ICD-10-CM | POA: Diagnosis not present

## 2020-03-22 DIAGNOSIS — E669 Obesity, unspecified: Secondary | ICD-10-CM

## 2020-03-22 NOTE — Progress Notes (Signed)
Bariatric Nutrition Appointment   Assessment  Planned Surgery: Sleeve   Start weight at [facility]: 411.3 lbs (01/26/2018) Weight today: 381.1 lbs  BMI: 61.58  Lifestyle & Nutrition Hx   Pt is doing great!  Pt states she bought a stationary bike to do when she cannot get to the gym. Pt states her niece gave her measuring cups. Pt states she had transportation for a long time so was not able to get to the gym. Pt states she recognizes she was not on track for a while but is getting back to it. Pt states she goes to bed early to keep from late night snacking and drinks plenty of water.  Pt states she needs to lose weight before she can get glaucoma.cataract surgery.   24-Hr Dietary Recall B ( 4AM): 1 packet oatmeal sometimes + sugar Snk ( AM): usually none  L ( 11 AM):tuna + crackers Snk ( PM): usually none  D ( PM): canned collard greens with [place] wing + potato + corn bread or salmon + rice + collard Snk ( PM): usually none  Beverages: 96oz bottled, Kool-aid with water, tea with lemon  Usual physical activity: exercise at [facility] Monday through Friday; stationary bike during commercials, 100 arm curls  Estimated energy needs:  1600 calories 180 g carbohydrates 120 g protein 44 g fat   Nutrition Diagnosis:  [place]-3.3 Overweight/obesity related to past poor dietary habits and physical inactivity as evidenced by patient w/ planned Sleeve Gastrectomy surgery following dietary guidelines for continued weight loss.    Nutrition Intervention:  Continued  Nutrition counseling for upcoming Bariatric Surgery. Goals: -Continue to limit rice, corn, and potatoes to half a cup -Continue to use less butter -Continue to have carrots with sandwiches  -Continue to drink lots of water  -Keep up the physical activity! -Working on not drinking any fluids with meals -Try various protein drinks/powders: no will buy to take after surgery -Continue to cut back to one packet of grits: goal met -when  you eat mac n cheese or pasta do not eat bread -Do arm chair excises 3 times a day  -Keep up your workouts!   Teaching Method Utilized: Visual, Auditory, Hands on Barriers to learning/adherence to lifestyle change: none identified  Demonstrated degree of understanding via:  teach back    Monitoring/Evaluation:   Dietary intake, exercise,and body

## 2020-03-27 ENCOUNTER — Ambulatory Visit: Payer: Self-pay | Admitting: Surgery

## 2020-03-27 NOTE — H&P (Signed)
Surgical Evaluation   Chief Complaint: morbid obesity  HPI:  Returns today to discuss surgical management of severe obesity.  Unfortunately since our last meeting her car broke down and she had no transportation to get to the gym and subsequently did gain back up little bit of weight, but she now has transportation and is back on her regimen of exercising, she is much improved in terms of nutrition literacy and remains highly motivated to lose weight.  She needs surgery and knee surgery and has been told by both of the surgeons that she has to lose weight prior to undergoing those interventions.  She denies any other changes in her health.  She has completed the bariatric pathway including multiple meetings with the dietitian Sandie Ano), psychologist, and imaging studies.  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  I do not anticipate that this shouldn't affect her ability to do a sleeve gastrectomy.   No Known Allergies  Past Medical History:  Diagnosis Date   Arthritis    Depression    Hypertension    Muscle spasms of lower extremity    and back    Past Surgical History:  Procedure Laterality Date   COLONOSCOPY WITH PROPOFOL N/A 09/24/2012   Procedure: COLONOSCOPY WITH PROPOFOL;  Surgeon: Jeryl Columbia, MD;  Location: [facility];  Service: Endoscopy;  Laterality: N/A;   COLONOSCOPY WITH PROPOFOL N/A 05/19/2016   Procedure: COLONOSCOPY WITH PROPOFOL;  Surgeon: Clarene Essex, MD;  Location: [facility] ENDOSCOPY;  Service: Endoscopy;  Laterality: N/A;   NO PAST SURGERIES      Family History  Problem Relation Age of Onset   Hypertension Mother    Diabetes Mother    Breast cancer Mother    Diabetes Father    Hypertension Father     Social History   Socioeconomic History   Marital status: Single    Spouse name: Not on file   Number of children: Not on file   Years of education: Not on file   Highest  education level: Not on file  Occupational History   Not on file  Tobacco Use   Smoking status: Never Smoker   Smokeless tobacco: Never Used  Vaping Use   Vaping Use: Never used  Substance and Sexual Activity   Alcohol use: No   Drug use: No   Sexual activity: Yes    Partners: Male    Birth control/protection: None  Other Topics Concern   Not on file  Social History Narrative   Not on file   Social Determinants of Health   Financial Resource Strain: Not on file  Food Insecurity: Not on file  Transportation Needs: Not on file  Physical Activity: Not on file  Stress: Not on file  Social Connections: Not on file    Current Outpatient Medications on File Prior to Visit  Medication Sig Dispense Refill   amLODipine (NORVASC) 10 MG tablet Take 10 mg by mouth daily.     bimatoprost (LUMIGAN) 0.03 % ophthalmic solution 1 drop.     brimonidine-timolol (COMBIGAN) 0.2-0.5 % ophthalmic solution Place 1 drop into both eyes every 12 (twelve) hours.     citalopram (CELEXA) 40 MG tablet Take 40 mg by mouth at bedtime.     cyclobenzaprine (FLEXERIL) 5 MG tablet      diphenhydrAMINE (BENADRYL) 25 MG tablet Take 25 mg by mouth every 6 (six) hours as needed for  itching or allergies.     Elastic Bandages & Supports (WRIST SPLINT/COCK-UP/LEFT L) MISC Wear nightly and with exacerbating activities. 1 each 0   Elastic Bandages & Supports (WRIST SPLINT/COCK-UP/RIGHT L) MISC Wear nightly and with exacerbating activities. 1 each 0   ergocalciferol (VITAMIN D2) 1.25 MG (50000 UT) capsule Take 50,000 Units by mouth once a week.     gabapentin (NEURONTIN) 400 MG capsule Take 400-800 mg by mouth 2 (two) times daily. 1 cap in the morning and 2 cap at night     hydrochlorothiazide (HYDRODIURIL) 25 MG tablet Take 25 mg by mouth every morning.      hydrOXYzine (ATARAX/VISTARIL) 25 MG tablet Take 1-2 tablets (25-50 mg total) by mouth at bedtime as needed. Take for insomnia. 60 tablet 0   Lurasidone HCl (LATUDA)  60 MG TABS Take by mouth at bedtime.     metroNIDAZOLE (FLAGYL) 500 MG tablet Take 1 tablet (500 mg total) by mouth 2 (two) times daily. 14 tablet 2   predniSONE (DELTASONE) 20 MG tablet Take 2 tablets daily with breakfast. 10 tablet 0   No current facility-administered medications on file prior to visit.    Review of Systems: a complete, 10pt review of systems was completed with pertinent positives and negatives as documented in the HPI  Physical Exam: Vitals  Weight: 391 lb   Height: 65 in  Body Surface Area: 2.63 m   Body Mass Index: 65.07 kg/m   Temp.: 97.7 F    Pulse: 102 (Regular)    BP: 128/84(Sitting, Left Arm, Standard)  Alert well-appearing, respirations, abdomen soft and nontender   CBC Latest Ref Rng & Units 05/23/2014 07/03/2011 11/14/2010  WBC 4.0 - 10.5 K/uL 6.8 6.0 -  Hemoglobin 12.0 - 15.0 g/dL 12.9 14.0 13.9  Hematocrit 36.0 - 46.0 % 40.4 41.6 41.0  Platelets 150 - 400 K/uL 334 319 -    CMP Latest Ref Rng & Units 05/23/2014 07/03/2011 11/14/2010  Glucose 70 - 99 mg/dL 129(H) 122(H) 113(H)  BUN 6 - 23 mg/dL _0 Creatinine 0.50 - 1.10 mg/dL 0.96 0.89 0.80  Sodium 135 - 145 mmol/L 140 137 141  Potassium 3.5 - 5.1 mmol/L 3.9 5.3(H) 4.3  Chloride 96 - 112 mmol/L 106 99 103  CO2 19 - 32 mmol/L 30 25 -  Calcium 8.4 - 10.5 mg/dL 9.4 9.8 -  Total Protein 6.0 - 8.3 g/dL - - -  Total Bilirubin 0.3 - 1.2 mg/dL - - -  Alkaline Phos 39 - 117 U/L - - -  AST 0 - 37 U/L - - -  ALT 0 - 35 U/L - - -    No results found for: INR, PROTIME  Imaging: No results found.   A/P: MORBID OBESITY (E66.01) Story: She has done a phenomenal job with the help of the dietitians in vastly improving her nutrition literacy and her overall health, having lost a fair amount of weight since we first met a few months ago. She remains a good candidate for sleeve gastrectomy. We again discussed the anatomy of this using a diagram to demonstrate and discussed the surgery including technical  aspects, the risks of bleeding, infection, pain, scarring, injury to intra-abdominal structures, staple line leak or abscess, chronic abdominal pain or nausea, new onset or worsened GERD, DVT/PE, pneumonia, heart attack, stroke, death, failure to reach weight loss goals and weight regain, hernia. Discussed the typical pre-, peri-, and postoperative course. Discussed the importance of lifelong behavioral changes to combat the  chronic and relapsing disease which is obesity. Discussed very low possibility of inability to complete the operation based on distorted anatomy from her left hemidiaphragm elevation and she expressed understanding. She should be ready to proceed with surgery.    There are no problems to display for this patient.      Romana Juniper, MD [facility] Surgery, [place]  See AMION to contact appropriate on-call provider

## 2020-03-27 NOTE — H&P (View-Only) (Signed)
Surgical Evaluation   Chief Complaint: morbid obesity  HPI:  Returns today to discuss surgical management of severe obesity.  Unfortunately since our last meeting her car broke down and she had no transportation to get to the gym and subsequently did gain back up little bit of weight, but she now has transportation and is back on her regimen of exercising, she is much improved in terms of nutrition literacy and remains highly motivated to lose weight.  She needs surgery and knee surgery and has been told by both of the surgeons that she has to lose weight prior to undergoing those interventions.  She denies any other changes in her health.  She has completed the bariatric pathway including multiple meetings with the dietitian (Alexis Scotece), psychologist, and imaging studies.  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  I do not anticipate that this shouldn't affect her ability to do a sleeve gastrectomy.   No Known Allergies  Past Medical History:  Diagnosis Date   Arthritis    Depression    Hypertension    Muscle spasms of lower extremity    and back    Past Surgical History:  Procedure Laterality Date   COLONOSCOPY WITH PROPOFOL N/A 09/24/2012   Procedure: COLONOSCOPY WITH PROPOFOL;  Surgeon: Marc E Magod, MD;  Location: [facility];  Service: Endoscopy;  Laterality: N/A;   COLONOSCOPY WITH PROPOFOL N/A 05/19/2016   Procedure: COLONOSCOPY WITH PROPOFOL;  Surgeon: Marc Magod, MD;  Location: [facility];  Service: Endoscopy;  Laterality: N/A;   NO PAST SURGERIES      Family History  Problem Relation Age of Onset   Hypertension Mother    Diabetes Mother    Breast cancer Mother    Diabetes Father    Hypertension Father     Social History   Socioeconomic History   Marital status: Single    Spouse name: Not on file   Number of children: Not on file   Years of education: Not on file   Highest  education level: Not on file  Occupational History   Not on file  Tobacco Use   Smoking status: Never Smoker   Smokeless tobacco: Never Used  Vaping Use   Vaping Use: Never used  Substance and Sexual Activity   Alcohol use: No   Drug use: No   Sexual activity: Yes    Partners: Male    Birth control/protection: None  Other Topics Concern   Not on file  Social History Narrative   Not on file   Social Determinants of Health   Financial Resource Strain: Not on file  Food Insecurity: Not on file  Transportation Needs: Not on file  Physical Activity: Not on file  Stress: Not on file  Social Connections: Not on file    Current Outpatient Medications on File Prior to Visit  Medication Sig Dispense Refill   amLODipine (NORVASC) 10 MG tablet Take 10 mg by mouth daily.     bimatoprost (LUMIGAN) 0.03 % ophthalmic solution 1 drop.     brimonidine-timolol (COMBIGAN) 0.2-0.5 % ophthalmic solution Place 1 drop into both eyes every 12 (twelve) hours.     citalopram (CELEXA) 40 MG tablet Take 40 mg by mouth at bedtime.     cyclobenzaprine (FLEXERIL) 5 MG tablet      diphenhydrAMINE (BENADRYL) 25 MG tablet Take 25 mg by mouth every 6 (six) hours as needed for   itching or allergies.     Elastic Bandages & Supports (WRIST SPLINT/COCK-UP/LEFT L) MISC Wear nightly and with exacerbating activities. 1 each 0   Elastic Bandages & Supports (WRIST SPLINT/COCK-UP/RIGHT L) MISC Wear nightly and with exacerbating activities. 1 each 0   ergocalciferol (VITAMIN D2) 1.25 MG (50000 UT) capsule Take 50,000 Units by mouth once a week.     gabapentin (NEURONTIN) 400 MG capsule Take 400-800 mg by mouth 2 (two) times daily. 1 cap in the morning and 2 cap at night     hydrochlorothiazide (HYDRODIURIL) 25 MG tablet Take 25 mg by mouth every morning.      hydrOXYzine (ATARAX/VISTARIL) 25 MG tablet Take 1-2 tablets (25-50 mg total) by mouth at bedtime as needed. Take for insomnia. 60 tablet 0   Lurasidone HCl (LATUDA)  60 MG TABS Take by mouth at bedtime.     metroNIDAZOLE (FLAGYL) 500 MG tablet Take 1 tablet (500 mg total) by mouth 2 (two) times daily. 14 tablet 2   predniSONE (DELTASONE) 20 MG tablet Take 2 tablets daily with breakfast. 10 tablet 0   No current facility-administered medications on file prior to visit.    Review of Systems: a complete, 10pt review of systems was completed with pertinent positives and negatives as documented in the HPI  Physical Exam: Vitals  Weight: 391 lb   Height: 65 in  Body Surface Area: 2.63 m   Body Mass Index: 65.07 kg/m   Temp.: 97.7 F    Pulse: 102 (Regular)    BP: 128/84(Sitting, Left Arm, Standard)  Alert well-appearing, respirations, abdomen soft and nontender   CBC Latest Ref Rng & Units 05/23/2014 07/03/2011 11/14/2010  WBC 4.0 - 10.5 K/uL 6.8 6.0 -  Hemoglobin 12.0 - 15.0 g/dL 12.9 14.0 13.9  Hematocrit 36.0 - 46.0 % 40.4 41.6 41.0  Platelets 150 - 400 K/uL 334 319 -    CMP Latest Ref Rng & Units 05/23/2014 07/03/2011 11/14/2010  Glucose 70 - 99 mg/dL 129(H) 122(H) 113(H)  BUN 6 - 23 mg/dL 7 9 6  Creatinine 0.50 - 1.10 mg/dL 0.96 0.89 0.80  Sodium 135 - 145 mmol/L 140 137 141  Potassium 3.5 - 5.1 mmol/L 3.9 5.3(H) 4.3  Chloride 96 - 112 mmol/L 106 99 103  CO2 19 - 32 mmol/L 30 25 -  Calcium 8.4 - 10.5 mg/dL 9.4 9.8 -  Total Protein 6.0 - 8.3 g/dL - - -  Total Bilirubin 0.3 - 1.2 mg/dL - - -  Alkaline Phos 39 - 117 U/L - - -  AST 0 - 37 U/L - - -  ALT 0 - 35 U/L - - -    No results found for: INR, PROTIME  Imaging: No results found.   A/P: MORBID OBESITY (E66.01) Story: She has done a phenomenal job with the help of the dietitians in vastly improving her nutrition literacy and her overall health, having lost a fair amount of weight since we first met a few months ago. She remains a good candidate for sleeve gastrectomy. We again discussed the anatomy of this using a diagram to demonstrate and discussed the surgery including technical  aspects, the risks of bleeding, infection, pain, scarring, injury to intra-abdominal structures, staple line leak or abscess, chronic abdominal pain or nausea, new onset or worsened GERD, DVT/PE, pneumonia, heart attack, stroke, death, failure to reach weight loss goals and weight regain, hernia. Discussed the typical pre-, peri-, and postoperative course. Discussed the importance of lifelong behavioral changes to combat the   chronic and relapsing disease which is obesity. Discussed very low possibility of inability to complete the operation based on distorted anatomy from her left hemidiaphragm elevation and she expressed understanding. She should be ready to proceed with surgery.    There are no problems to display for this patient.      Noboru Bidinger, MD [facility]  See AMION to contact appropriate on-call provider   

## 2020-04-02 ENCOUNTER — Other Ambulatory Visit: Payer: Self-pay

## 2020-04-02 ENCOUNTER — Encounter: Payer: Medicare HMO | Attending: Surgery | Admitting: Skilled Nursing Facility1

## 2020-04-02 DIAGNOSIS — E669 Obesity, unspecified: Secondary | ICD-10-CM | POA: Diagnosis not present

## 2020-04-02 NOTE — Progress Notes (Signed)
Pre-Operative Nutrition Class:  Appt start time: 3762   End time:  1830.  Patient was seen on 04/02/2020 for Pre-Operative Bariatric Surgery Education at the [facility].    Class conducted virtually. Pt identified by name and DOB. Pt verbally agrees to the limitations of this visit type.   Start weight at [facility]: 411.3 lbs (01/26/2018) BMI: 61.58  The following the learning objectives were met by the patient during this course:  Identify Pre-Op Dietary Goals and will begin 2 weeks pre-operatively  Identify appropriate sources of fluids and proteins   State protein recommendations and appropriate sources pre and post-operatively  Identify Post-Operative Dietary Goals and will follow for 2 weeks post-operatively  Identify appropriate multivitamin and calcium sources  Describe the need for physical activity post-operatively and will follow MD recommendations  State when to call healthcare provider regarding medication questions or post-operative complications  Handouts given during class include:  Pre-Op Bariatric Surgery Diet Handout  Protein Shake Handout  Post-Op Bariatric Surgery Nutrition Handout  BELT Program Information Flyer  Support Group Information Flyer  [facility] Bariatric Supplements Price List  Follow-Up Plan: Patient will follow-up at [facility] 2 weeks post operatively for diet advancement per MD.

## 2020-04-12 NOTE — Patient Instructions (Addendum)
DUE TO COVID-19 ONLY ONE VISITOR IS ALLOWED TO COME WITH YOU AND STAY IN THE WAITING ROOM ONLY DURING PRE OP AND PROCEDURE DAY OF SURGERY. THE 1 VISITOR  MAY VISIT WITH YOU AFTER SURGERY IN YOUR PRIVATE ROOM DURING VISITING HOURS ONLY!  YOU NEED TO HAVE A COVID 19 TEST ON: 04/20/20 @ 10:00 AM , THIS TEST MUST BE DONE BEFORE SURGERY,  COVID TESTING SITE [place] [address], IT IS ON THE RIGHT GOING OUT [address] APPROXIMATELY  2 MINUTES PAST [employer] ON THE RIGHT. ONCE YOUR COVID TEST IS COMPLETED,  PLEASE BEGIN THE QUARANTINE INSTRUCTIONS AS OUTLINED IN YOUR HANDOUT.                Carla Sloan   Your procedure is scheduled on: 04/24/20   Report to [place]  Entrance   Report to short stay at : 5:30 AM     Call this number if you have problems the morning of surgery [phone]    Remember:   MORNING OF SURGERY DRINK:   DRINK 1 G2 drink BEFORE YOU LEAVE HOME, DRINK ALL OF THE  G2 DRINK AT ONE TIME.   NO SOLID FOOD AFTER 600 PM THE NIGHT BEFORE YOUR SURGERY. YOU MAY DRINK CLEAR FLUIDS (UNTIL: 4:15 AM). THE G2 DRINK YOU DRINK BEFORE YOU LEAVE HOME WILL BE THE LAST FLUIDS YOU DRINK BEFORE SURGERY, AT 4:15 AM.  PAIN IS EXPECTED AFTER SURGERY AND WILL NOT BE COMPLETELY ELIMINATED. AMBULATION AND TYLENOL WILL HELP REDUCE INCISIONAL AND GAS PAIN. MOVEMENT IS KEY!  YOU ARE EXPECTED TO BE OUT OF BED WITHIN 4 HOURS OF ADMISSION TO YOUR PATIENT ROOM.  SITTING IN THE RECLINER THROUGHOUT THE DAY IS IMPORTANT FOR DRINKING FLUIDS AND MOVING GAS THROUGHOUT THE GI TRACT.  COMPRESSION STOCKINGS SHOULD BE WORN [place] UNLESS YOU ARE WALKING.   INCENTIVE SPIROMETER SHOULD BE USED EVERY HOUR WHILE AWAKE TO DECREASE POST-OPERATIVE COMPLICATIONS SUCH AS PNEUMONIA.  WHEN DISCHARGED HOME, IT IS IMPORTANT TO CONTINUE TO WALK EVERY HOUR AND USE THE INCENTIVE SPIROMETER EVERY HOUR.   CLEAR LIQUID DIET  Foods Allowed                                                                      Foods Excluded  Coffee and tea, regular and decaf                             liquids that you cannot  Plain Jell-O any favor except red or purple                                           see through such as: Fruit ices (not with fruit pulp)                                     milk, soups, orange juice  Iced Popsicles  All solid food Carbonated beverages, regular and diet                                    Cranberry, grape and apple juices Sports drinks like Gatorade Lightly seasoned clear broth or consume(fat free) Sugar, honey syrup  Sample Menu Breakfast                                Lunch                                     Supper Cranberry juice                    Beef broth                            Chicken broth Jell-O                                     Grape juice                           Apple juice Coffee or tea                        Jell-O                                      Popsicle                                                Coffee or tea                        Coffee or tea  _____________________________________________________________________   BRUSH YOUR TEETH MORNING OF SURGERY AND RINSE YOUR MOUTH OUT, NO CHEWING GUM CANDY OR MINTS.   Take these medicines the morning of surgery with A SIP OF WATER: amlodipine,citalopram,gabapentin. Use eye drops as usual.                               You may not have any metal on your body including hair pins and              piercings  Do not wear jewelry, make-up, lotions, powders or perfumes, deodorant             Do not wear nail polish on your fingernails.  Do not shave  48 hours prior to surgery.    Do not bring valuables to the hospital. [place].  Contacts, dentures or bridgework may not be worn into surgery.  Leave suitcase in the car. After surgery it may be brought  to your room.    Patients discharged the day of  surgery will not be allowed to drive home. IF YOU ARE HAVING SURGERY AND GOING HOME THE SAME DAY, YOU MUST HAVE AN ADULT TO DRIVE YOU HOME AND BE WITH YOU FOR 24 HOURS. YOU MAY GO HOME BY TAXI OR UBER OR ORTHERWISE, BUT AN ADULT MUST ACCOMPANY YOU HOME AND STAY WITH YOU FOR 24 HOURS.  Name and phone number of your driver:  Special Instructions: N/A              Please read over the following fact sheets you were given: _____________________________________________________________________          [facility] - Preparing for Surgery Before surgery, you can play an important role.  Because skin is not sterile, your skin needs to be as free of germs as possible.  You can reduce the number of germs on your skin by washing with CHG (chlorahexidine gluconate) soap before surgery.  CHG is an antiseptic cleaner which kills germs and bonds with the skin to continue killing germs even after washing. Please DO NOT use if you have an allergy to CHG or antibacterial soaps.  If your skin becomes reddened/irritated stop using the CHG and inform your nurse when you arrive at Short Stay. Do not shave (including legs and underarms) for at least 48 hours prior to the first CHG shower.  You may shave your face/neck. Please follow these instructions carefully:  1.  Shower with CHG Soap the night before surgery and the  morning of Surgery.  2.  If you choose to wash your hair, wash your hair first as usual with your  normal  shampoo.  3.  After you shampoo, rinse your hair and body thoroughly to remove the  shampoo.                           4.  Use CHG as you would any other liquid soap.  You can apply chg directly  to the skin and wash                       Gently with a scrungie or clean washcloth.  5.  Apply the CHG Soap to your body ONLY FROM THE NECK DOWN.   Do not use on face/ open                           Wound or open sores. Avoid contact with eyes, ears mouth  and genitals (private parts).                       Wash face,  Genitals (private parts) with your normal soap.             6.  Wash thoroughly, paying special attention to the area where your surgery  will be performed.  7.  Thoroughly rinse your body with warm water from the neck down.  8.  DO NOT shower/wash with your normal soap after using and rinsing off  the CHG Soap.                9.  Pat yourself dry with a clean towel.            10.  Wear clean pajamas.            11.  Place clean sheets on your bed the night of your first shower and do not  sleep with pets. Day of Surgery : Do not apply any lotions/deodorants the morning of surgery.  Please wear clean clothes to the hospital/surgery center.  FAILURE TO FOLLOW THESE INSTRUCTIONS MAY RESULT IN THE CANCELLATION OF YOUR SURGERY PATIENT SIGNATURE_________________________________  NURSE SIGNATURE__________________________________  ________________________________________________________________________

## 2020-04-16 ENCOUNTER — Encounter (HOSPITAL_COMMUNITY): Payer: Self-pay

## 2020-04-16 ENCOUNTER — Encounter (HOSPITAL_COMMUNITY)
Admission: RE | Admit: 2020-04-16 | Discharge: 2020-04-16 | Disposition: A | Discharge status: Home / Self Care | Payer: Medicare HMO | Source: Ambulatory Visit | Attending: Surgery | Admitting: Surgery

## 2020-04-16 ENCOUNTER — Other Ambulatory Visit: Payer: Self-pay

## 2020-04-16 DIAGNOSIS — Z01812 Encounter for preprocedural laboratory examination: Secondary | ICD-10-CM | POA: Diagnosis not present

## 2020-04-16 HISTORY — DX: Sleep apnea, unspecified: G47.30

## 2020-04-16 LAB — COMPREHENSIVE METABOLIC PANEL
ALT: 29 U/L (ref 0–44)
AST: 29 U/L (ref 15–41)
Albumin: 4.2 g/dL (ref 3.5–5.0)
Alkaline Phosphatase: 74 U/L (ref 38–126)
Anion gap: 12 (ref 5–15)
BUN: 16 mg/dL (ref 6–20)
CO2: 25 mmol/L (ref 22–32)
Calcium: 10.4 mg/dL — ABNORMAL HIGH (ref 8.9–10.3)
Chloride: 103 mmol/L (ref 98–111)
Creatinine, Ser: 0.71 mg/dL (ref 0.44–1.00)
GFR, Estimated: >60 mL/min (ref >60)
Glucose, Bld: 97 mg/dL (ref 70–99)
Potassium: 4.8 mmol/L (ref 3.5–5.1)
Sodium: 140 mmol/L (ref 135–145)
Total Bilirubin: 0.5 mg/dL (ref 0.3–1.2)
Total Protein: 8.3 g/dL — ABNORMAL HIGH (ref 6.5–8.1)

## 2020-04-16 LAB — CBC WITH DIFFERENTIAL/PLATELET
Abs Immature Granulocytes: 0.02 10*3/uL (ref 0.00–0.07)
Basophils Absolute: 0 10*3/uL (ref 0.0–0.1)
Basophils Relative: 1 %
Eosinophils Absolute: 0.1 10*3/uL (ref 0.0–0.5)
Eosinophils Relative: 1 %
HCT: 44.6 % (ref 36.0–46.0)
Hemoglobin: 14.1 g/dL (ref 12.0–15.0)
Immature Granulocytes: 0 %
Lymphocytes Relative: 37 %
Lymphs Abs: 2.2 10*3/uL (ref 0.7–4.0)
MCH: 28.7 pg (ref 26.0–34.0)
MCHC: 31.6 g/dL (ref 30.0–36.0)
MCV: 90.8 fL (ref 80.0–100.0)
Monocytes Absolute: 0.4 10*3/uL (ref 0.1–1.0)
Monocytes Relative: 7 %
Neutro Abs: 3.2 10*3/uL (ref 1.7–7.7)
Neutrophils Relative %: 54 %
Platelets: 339 10*3/uL (ref 150–400)
RBC: 4.91 MIL/uL (ref 3.87–5.11)
RDW: 13.9 % (ref 11.5–15.5)
WBC: 5.8 10*3/uL (ref 4.0–10.5)
nRBC: 0 % (ref 0.0–0.2)

## 2020-04-16 NOTE — Progress Notes (Signed)
COVID Vaccine Completed: Yes Date COVID Vaccine completed: 02/07/20 [place] COVID vaccine manufacturer: Pfizer    PCP - Dr. Nolene Ebbs Cardiologist -   Chest x-ray -  EKG -  Stress Test -  ECHO -  Cardiac Cath -  Pacemaker/ICD device last checked:  Sleep Study - Yes CPAP - Yes  Fasting Blood Sugar -  Checks Blood Sugar _____ times a day  Blood Thinner Instructions: Aspirin Instructions: Last Dose:  Anesthesia review: Hx: HTN,OSA(CPAP)  Patient denies shortness of breath, fever, cough and chest pain at PAT appointment   Patient verbalized understanding of instructions that were given to them at the PAT appointment. Patient was also instructed that they will need to review over the PAT instructions again at home before surgery.

## 2020-04-17 ENCOUNTER — Telehealth: Payer: Self-pay | Admitting: Skilled Nursing Facility1

## 2020-04-17 NOTE — Telephone Encounter (Signed)
Returned pts call.  Pt called stating at hr presurgical testing appt they did not have anymore Gatorade G2 so she had to go get one but cannot find one at [employer] or [employer]  Dietitian advised to try a gas station like [place] or a pharmacy

## 2020-04-20 ENCOUNTER — Other Ambulatory Visit (HOSPITAL_COMMUNITY)
Admission: RE | Admit: 2020-04-20 | Discharge: 2020-04-20 | Disposition: A | Discharge status: Home / Self Care | Payer: Medicare HMO | Source: Ambulatory Visit | Attending: Surgery | Admitting: Surgery

## 2020-04-20 DIAGNOSIS — Z20822 Contact with and (suspected) exposure to covid-19: Secondary | ICD-10-CM | POA: Diagnosis not present

## 2020-04-20 DIAGNOSIS — Z01812 Encounter for preprocedural laboratory examination: Secondary | ICD-10-CM | POA: Insufficient documentation

## 2020-04-20 LAB — SARS CORONAVIRUS 2 (TAT 6-24 HRS): SARS Coronavirus 2: NEGATIVE

## 2020-04-23 MED ORDER — BUPIVACAINE LIPOSOME 1.3 % IJ SUSP
20.0000 mL | Freq: Once | INTRAMUSCULAR | Status: DC
  Filled 2020-04-23: qty 20

## 2020-04-24 ENCOUNTER — Inpatient Hospital Stay (HOSPITAL_COMMUNITY)
Admission: RE | Admit: 2020-04-24 | Discharge: 2020-04-25 | DRG: 621 | Disposition: A | Discharge status: Home / Self Care | Payer: Medicare HMO | Source: Ambulatory Visit | Attending: Surgery | Admitting: Surgery

## 2020-04-24 ENCOUNTER — Inpatient Hospital Stay (HOSPITAL_COMMUNITY): Payer: Medicare HMO | Admitting: Certified Registered Nurse Anesthetist

## 2020-04-24 ENCOUNTER — Encounter (HOSPITAL_COMMUNITY)
Admission: RE | Disposition: A | Discharge status: Home / Self Care | Payer: Self-pay | Source: Ambulatory Visit | Attending: Surgery

## 2020-04-24 ENCOUNTER — Encounter (HOSPITAL_COMMUNITY): Payer: Self-pay | Admitting: Surgery

## 2020-04-24 ENCOUNTER — Other Ambulatory Visit: Payer: Self-pay

## 2020-04-24 DIAGNOSIS — Z6841 Body Mass Index (BMI) 40.0 and over, adult: Secondary | ICD-10-CM

## 2020-04-24 DIAGNOSIS — Z79899 Other long term (current) drug therapy: Secondary | ICD-10-CM

## 2020-04-24 DIAGNOSIS — Z20822 Contact with and (suspected) exposure to covid-19: Secondary | ICD-10-CM | POA: Diagnosis present

## 2020-04-24 DIAGNOSIS — I10 Essential (primary) hypertension: Secondary | ICD-10-CM | POA: Diagnosis present

## 2020-04-24 HISTORY — PX: UPPER GI ENDOSCOPY: SHX6162

## 2020-04-24 HISTORY — PX: LAPAROSCOPIC GASTRIC SLEEVE RESECTION: SHX5895

## 2020-04-24 LAB — TYPE AND SCREEN
ABO/RH(D): O POS
Antibody Screen: NEGATIVE

## 2020-04-24 LAB — ABO/RH: ABO/RH(D): O POS

## 2020-04-24 SURGERY — GASTRECTOMY, SLEEVE, LAPAROSCOPIC
Anesthesia: General | Site: Abdomen

## 2020-04-24 MED ORDER — SCOPOLAMINE 1 MG/3DAYS TD PT72
1.0000 | MEDICATED_PATCH | TRANSDERMAL | Status: DC
  Administered 2020-04-24: 1.5 mg via TRANSDERMAL
  Filled 2020-04-24: qty 1

## 2020-04-24 MED ORDER — PROPOFOL 10 MG/ML IV BOLUS
INTRAVENOUS | Status: AC
  Filled 2020-04-24: qty 20

## 2020-04-24 MED ORDER — SUCCINYLCHOLINE CHLORIDE 200 MG/10ML IV SOSY
PREFILLED_SYRINGE | INTRAVENOUS | Status: DC | PRN
  Administered 2020-04-24: 140 mg via INTRAVENOUS

## 2020-04-24 MED ORDER — ONDANSETRON HCL 4 MG/2ML IJ SOLN: 4.0000 mg | INTRAMUSCULAR | Status: DC | PRN

## 2020-04-24 MED ORDER — MEPERIDINE HCL 50 MG/ML IJ SOLN: 6.2500 mg | INTRAMUSCULAR | Status: DC | PRN

## 2020-04-24 MED ORDER — MIDAZOLAM HCL 2 MG/2ML IJ SOLN
INTRAMUSCULAR | Status: DC | PRN
  Administered 2020-04-24: 2 mg via INTRAVENOUS

## 2020-04-24 MED ORDER — FENTANYL CITRATE (PF) 100 MCG/2ML IJ SOLN
INTRAMUSCULAR | Status: AC
  Filled 2020-04-24: qty 2

## 2020-04-24 MED ORDER — LURASIDONE HCL 20 MG PO TABS
60.0000 mg | ORAL_TABLET | Freq: Every day | ORAL | Status: DC
  Administered 2020-04-24: 60 mg via ORAL
  Filled 2020-04-24: qty 3

## 2020-04-24 MED ORDER — CITALOPRAM HYDROBROMIDE 40 MG PO TABS
40.0000 mg | ORAL_TABLET | Freq: Every day | ORAL | Status: DC
  Administered 2020-04-25: 40 mg via ORAL
  Filled 2020-04-24: qty 1
  Filled 2020-04-24: qty 2

## 2020-04-24 MED ORDER — LIDOCAINE HCL (PF) 2 % IJ SOLN
INTRAMUSCULAR | Status: DC | PRN
  Administered 2020-04-24: 1.5 mg/kg/h via INTRADERMAL

## 2020-04-24 MED ORDER — FENTANYL CITRATE (PF) 100 MCG/2ML IJ SOLN
INTRAMUSCULAR | Status: DC | PRN
  Administered 2020-04-24 (×2): 50 ug via INTRAVENOUS
  Administered 2020-04-24 (×2): 25 ug via INTRAVENOUS
  Administered 2020-04-24: 50 ug via INTRAVENOUS

## 2020-04-24 MED ORDER — HYDRALAZINE HCL 20 MG/ML IJ SOLN: 10.0000 mg | INTRAMUSCULAR | Status: DC | PRN

## 2020-04-24 MED ORDER — CHLORHEXIDINE GLUCONATE 0.12 % MT SOLN
15.0000 mL | Freq: Once | OROMUCOSAL | Status: AC
  Administered 2020-04-24: 15 mL via OROMUCOSAL

## 2020-04-24 MED ORDER — SUGAMMADEX SODIUM 200 MG/2ML IV SOLN
INTRAVENOUS | Status: DC | PRN
  Administered 2020-04-24: 380 mg via INTRAVENOUS

## 2020-04-24 MED ORDER — ACETAMINOPHEN 500 MG PO TABS
1000.0000 mg | ORAL_TABLET | Freq: Three times a day (TID) | ORAL | Status: DC
  Administered 2020-04-24: 1000 mg via ORAL
  Filled 2020-04-24: qty 2

## 2020-04-24 MED ORDER — DEXAMETHASONE SODIUM PHOSPHATE 10 MG/ML IJ SOLN
INTRAMUSCULAR | Status: DC | PRN
  Administered 2020-04-24: 8 mg via INTRAVENOUS

## 2020-04-24 MED ORDER — GABAPENTIN 300 MG PO CAPS
300.0000 mg | ORAL_CAPSULE | Freq: Three times a day (TID) | ORAL | Status: DC
  Administered 2020-04-24 – 2020-04-25 (×3): 300 mg via ORAL
  Filled 2020-04-24 (×3): qty 1

## 2020-04-24 MED ORDER — LACTATED RINGERS IV SOLN: INTRAVENOUS | Status: DC

## 2020-04-24 MED ORDER — ACETAMINOPHEN 10 MG/ML IV SOLN: 1000.0000 mg | Freq: Once | INTRAVENOUS | Status: DC | PRN

## 2020-04-24 MED ORDER — ENOXAPARIN SODIUM 30 MG/0.3ML ~~LOC~~ SOLN
30.0000 mg | Freq: Two times a day (BID) | SUBCUTANEOUS | Status: DC
  Administered 2020-04-24 – 2020-04-25 (×2): 30 mg via SUBCUTANEOUS
  Filled 2020-04-24 (×2): qty 0.3

## 2020-04-24 MED ORDER — 0.9 % SODIUM CHLORIDE (POUR BTL) OPTIME
TOPICAL | Status: DC | PRN
  Administered 2020-04-24: 1000 mL

## 2020-04-24 MED ORDER — DEXAMETHASONE SODIUM PHOSPHATE 10 MG/ML IJ SOLN
INTRAMUSCULAR | Status: AC
  Filled 2020-04-24: qty 1

## 2020-04-24 MED ORDER — GABAPENTIN 300 MG PO CAPS: 300.0000 mg | ORAL_CAPSULE | ORAL | Status: DC

## 2020-04-24 MED ORDER — ENSURE MAX PROTEIN PO LIQD
2.0000 [oz_av] | ORAL | Status: DC
  Administered 2020-04-25 (×2): 2 [oz_av] via ORAL

## 2020-04-24 MED ORDER — APREPITANT 40 MG PO CAPS
40.0000 mg | ORAL_CAPSULE | ORAL | Status: AC
  Administered 2020-04-24: 40 mg via ORAL
  Filled 2020-04-24: qty 1

## 2020-04-24 MED ORDER — METHOCARBAMOL 500 MG IVPB - SIMPLE MED
500.0000 mg | Freq: Four times a day (QID) | INTRAVENOUS | Status: DC | PRN
  Filled 2020-04-24: qty 50

## 2020-04-24 MED ORDER — CHLORHEXIDINE GLUCONATE 4 % EX LIQD: 60.0000 mL | Freq: Once | CUTANEOUS | Status: DC

## 2020-04-24 MED ORDER — ROCURONIUM BROMIDE 10 MG/ML (PF) SYRINGE
PREFILLED_SYRINGE | INTRAVENOUS | Status: AC
  Filled 2020-04-24: qty 10

## 2020-04-24 MED ORDER — STERILE WATER FOR IRRIGATION IR SOLN
Status: DC | PRN
  Administered 2020-04-24: 2000 mL

## 2020-04-24 MED ORDER — KETOROLAC TROMETHAMINE 15 MG/ML IJ SOLN: 15.0000 mg | Freq: Three times a day (TID) | INTRAMUSCULAR | Status: DC | PRN

## 2020-04-24 MED ORDER — ONDANSETRON HCL 4 MG/2ML IJ SOLN
INTRAMUSCULAR | Status: AC
  Filled 2020-04-24: qty 2

## 2020-04-24 MED ORDER — AMLODIPINE BESYLATE 10 MG PO TABS: 10.0000 mg | ORAL_TABLET | Freq: Every day | ORAL | Status: DC

## 2020-04-24 MED ORDER — SUCCINYLCHOLINE CHLORIDE 200 MG/10ML IV SOSY
PREFILLED_SYRINGE | INTRAVENOUS | Status: AC
  Filled 2020-04-24: qty 10

## 2020-04-24 MED ORDER — KETAMINE HCL 10 MG/ML IJ SOLN
INTRAMUSCULAR | Status: AC
  Filled 2020-04-24: qty 1

## 2020-04-24 MED ORDER — KETAMINE HCL 10 MG/ML IJ SOLN
INTRAMUSCULAR | Status: DC | PRN
  Administered 2020-04-24 (×2): 15 mg via INTRAVENOUS

## 2020-04-24 MED ORDER — ROCURONIUM BROMIDE 10 MG/ML (PF) SYRINGE
PREFILLED_SYRINGE | INTRAVENOUS | Status: DC | PRN
  Administered 2020-04-24: 20 mg via INTRAVENOUS
  Administered 2020-04-24: 50 mg via INTRAVENOUS

## 2020-04-24 MED ORDER — LACTATED RINGERS IR SOLN
Status: DC | PRN
  Administered 2020-04-24: 1000 mL

## 2020-04-24 MED ORDER — FENTANYL CITRATE (PF) 100 MCG/2ML IJ SOLN
25.0000 ug | INTRAMUSCULAR | Status: DC | PRN
  Administered 2020-04-24: 50 ug via INTRAVENOUS

## 2020-04-24 MED ORDER — ACETAMINOPHEN 160 MG/5ML PO SOLN: 325.0000 mg | Freq: Once | ORAL | Status: DC | PRN

## 2020-04-24 MED ORDER — AMISULPRIDE (ANTIEMETIC) 5 MG/2ML IV SOLN: 10.0000 mg | Freq: Once | INTRAVENOUS | Status: DC | PRN

## 2020-04-24 MED ORDER — BUPIVACAINE-EPINEPHRINE 0.25% -1:200000 IJ SOLN
INTRAMUSCULAR | Status: DC | PRN
  Administered 2020-04-24: 30 mL

## 2020-04-24 MED ORDER — ACETAMINOPHEN 325 MG PO TABS: 325.0000 mg | ORAL_TABLET | Freq: Once | ORAL | Status: DC | PRN

## 2020-04-24 MED ORDER — PROPOFOL 10 MG/ML IV BOLUS
INTRAVENOUS | Status: DC | PRN
  Administered 2020-04-24: 200 mg via INTRAVENOUS

## 2020-04-24 MED ORDER — LIDOCAINE HCL (PF) 2 % IJ SOLN
INTRAMUSCULAR | Status: AC
  Filled 2020-04-24: qty 10

## 2020-04-24 MED ORDER — PANTOPRAZOLE SODIUM 40 MG IV SOLR
40.0000 mg | Freq: Every day | INTRAVENOUS | Status: DC
  Administered 2020-04-24: 21:00:00 40 mg via INTRAVENOUS
  Filled 2020-04-24: qty 40

## 2020-04-24 MED ORDER — LIDOCAINE HCL (PF) 2 % IJ SOLN
INTRAMUSCULAR | Status: AC
  Filled 2020-04-24: qty 5

## 2020-04-24 MED ORDER — BUPIVACAINE LIPOSOME 1.3 % IJ SUSP
INTRAMUSCULAR | Status: DC | PRN
  Administered 2020-04-24: 20 mL

## 2020-04-24 MED ORDER — SODIUM CHLORIDE 0.9 % IV SOLN
2.0000 g | INTRAVENOUS | Status: AC
  Administered 2020-04-24: 2 g via INTRAVENOUS
  Filled 2020-04-24: qty 2

## 2020-04-24 MED ORDER — HYDROMORPHONE HCL 1 MG/ML IJ SOLN
0.5000 mg | INTRAMUSCULAR | Status: DC | PRN
Start: 2020-04-24 — End: 2020-04-25
  Administered 2020-04-24: 0.5 mg via INTRAVENOUS
  Filled 2020-04-24: qty 0.5

## 2020-04-24 MED ORDER — SIMETHICONE 80 MG PO CHEW: 80.0000 mg | CHEWABLE_TABLET | Freq: Four times a day (QID) | ORAL | Status: DC | PRN

## 2020-04-24 MED ORDER — OXYCODONE HCL 5 MG/5ML PO SOLN
5.0000 mg | Freq: Four times a day (QID) | ORAL | Status: DC | PRN
  Administered 2020-04-24: 5 mg via ORAL
  Filled 2020-04-24: qty 5

## 2020-04-24 MED ORDER — TRAMADOL HCL 50 MG PO TABS
50.0000 mg | ORAL_TABLET | Freq: Four times a day (QID) | ORAL | Status: DC | PRN
  Administered 2020-04-24: 50 mg via ORAL
  Filled 2020-04-24: qty 1

## 2020-04-24 MED ORDER — METOPROLOL TARTRATE 5 MG/5ML IV SOLN
5.0000 mg | Freq: Four times a day (QID) | INTRAVENOUS | Status: DC | PRN
Start: 2020-04-24 — End: 2020-04-25
  Filled 2020-04-24: qty 5

## 2020-04-24 MED ORDER — DOCUSATE SODIUM 100 MG PO CAPS
100.0000 mg | ORAL_CAPSULE | Freq: Two times a day (BID) | ORAL | Status: DC
  Administered 2020-04-24 – 2020-04-25 (×2): 100 mg via ORAL
  Filled 2020-04-24 (×2): qty 1

## 2020-04-24 MED ORDER — ACETAMINOPHEN 500 MG PO TABS
1000.0000 mg | ORAL_TABLET | ORAL | Status: AC
  Administered 2020-04-24: 1000 mg via ORAL
  Filled 2020-04-24: qty 2

## 2020-04-24 MED ORDER — HYDROXYZINE HCL 25 MG PO TABS: 25.0000 mg | ORAL_TABLET | Freq: Every evening | ORAL | Status: DC | PRN

## 2020-04-24 MED ORDER — ORAL CARE MOUTH RINSE: 15.0000 mL | Freq: Once | OROMUCOSAL | Status: AC

## 2020-04-24 MED ORDER — ACETAMINOPHEN 160 MG/5ML PO SOLN
1000.0000 mg | Freq: Three times a day (TID) | ORAL | Status: DC
  Administered 2020-04-24 – 2020-04-25 (×2): 1000 mg via ORAL
  Filled 2020-04-24 (×2): qty 40.6

## 2020-04-24 MED ORDER — GABAPENTIN 100 MG PO CAPS
200.0000 mg | ORAL_CAPSULE | Freq: Two times a day (BID) | ORAL | Status: DC
  Administered 2020-04-24: 200 mg via ORAL
  Filled 2020-04-24: qty 2

## 2020-04-24 MED ORDER — ONDANSETRON HCL 4 MG/2ML IJ SOLN
INTRAMUSCULAR | Status: DC | PRN
  Administered 2020-04-24: 4 mg via INTRAVENOUS

## 2020-04-24 MED ORDER — BUPIVACAINE-EPINEPHRINE (PF) 0.25% -1:200000 IJ SOLN
INTRAMUSCULAR | Status: AC
  Filled 2020-04-24: qty 30

## 2020-04-24 MED ORDER — MIDAZOLAM HCL 2 MG/2ML IJ SOLN
INTRAMUSCULAR | Status: AC
  Filled 2020-04-24: qty 2

## 2020-04-24 MED ORDER — SUGAMMADEX SODIUM 500 MG/5ML IV SOLN
INTRAVENOUS | Status: AC
  Filled 2020-04-24: qty 5

## 2020-04-24 MED ORDER — HEPARIN SODIUM (PORCINE) 5000 UNIT/ML IJ SOLN
5000.0000 [IU] | INTRAMUSCULAR | Status: AC
  Administered 2020-04-24: 5000 [IU] via SUBCUTANEOUS
  Filled 2020-04-24: qty 1

## 2020-04-24 MED ORDER — LIDOCAINE 2% (20 MG/ML) 5 ML SYRINGE
INTRAMUSCULAR | Status: DC | PRN
  Administered 2020-04-24: 60 mg via INTRAVENOUS

## 2020-04-24 MED ORDER — SODIUM CHLORIDE 0.9 % IV SOLN: INTRAVENOUS | Status: DC

## 2020-04-24 SURGICAL SUPPLY — 63 items
APPLIER CLIP ROT 10 11.4 M/L (STAPLE)
APPLIER CLIP ROT 13.4 12 LRG (CLIP) ×3
BAG LAPAROSCOPIC 12 15 PORT 16 (BASKET) IMPLANT
BAG RETRIEVAL 12/15 (BASKET)
BENZOIN TINCTURE PRP APPL 2/3 (GAUZE/BANDAGES/DRESSINGS) ×3 IMPLANT
BLADE SURG SZ11 CARB STEEL (BLADE) ×3 IMPLANT
BNDG ADH 1X3 SHEER STRL LF (GAUZE/BANDAGES/DRESSINGS) ×18 IMPLANT
CABLE HIGH FREQUENCY MONO STRZ (ELECTRODE) ×3 IMPLANT
CHLORAPREP W/TINT 26 (MISCELLANEOUS) ×6 IMPLANT
CLIP APPLIE ROT 10 11.4 M/L (STAPLE) IMPLANT
CLIP APPLIE ROT 13.4 12 LRG (CLIP) ×2 IMPLANT
COVER SURGICAL LIGHT HANDLE (MISCELLANEOUS) ×3 IMPLANT
COVER WAND RF STERILE (DRAPES) IMPLANT
DECANTER SPIKE VIAL GLASS SM (MISCELLANEOUS) ×3 IMPLANT
DEVICE SUT QUICK LOAD TK 5 (STAPLE) IMPLANT
DEVICE SUT TI-KNOT TK 5X26 (MISCELLANEOUS) IMPLANT
DRAPE UTILITY XL STRL (DRAPES) ×6 IMPLANT
ELECT REM PT RETURN 15FT ADLT (MISCELLANEOUS) ×3 IMPLANT
GAUZE SPONGE 4X4 12PLY STRL (GAUZE/BANDAGES/DRESSINGS) IMPLANT
GLOVE SURG ENC MOIS LTX SZ6 (GLOVE) ×3 IMPLANT
GLOVE SURG UNDER LTX SZ6.5 (GLOVE) ×3 IMPLANT
GOWN STRL REUS W/TWL LRG LVL3 (GOWN DISPOSABLE) ×3 IMPLANT
GOWN STRL REUS W/TWL XL LVL3 (GOWN DISPOSABLE) ×6 IMPLANT
GRASPER SUT TROCAR 14GX15 (MISCELLANEOUS) ×3 IMPLANT
KIT BASIN OR (CUSTOM PROCEDURE TRAY) ×3 IMPLANT
KIT TURNOVER KIT A (KITS) ×3 IMPLANT
MARKER SKIN DUAL TIP RULER LAB (MISCELLANEOUS) ×3 IMPLANT
MAT PREVALON FULL STRYKER (MISCELLANEOUS) ×3 IMPLANT
NEEDLE SPNL 22GX3.5 QUINCKE BK (NEEDLE) ×3 IMPLANT
PACK UNIVERSAL I (CUSTOM PROCEDURE TRAY) ×3 IMPLANT
RELOAD ENDO STITCH (ENDOMECHANICALS) IMPLANT
RELOAD STAPLER BLUE 60MM (STAPLE) ×6 IMPLANT
RELOAD STAPLER GOLD 60MM (STAPLE) ×4 IMPLANT
RELOAD STAPLER GREEN 60MM (STAPLE) ×2 IMPLANT
SCISSORS LAP 5X45 EPIX DISP (ENDOMECHANICALS) ×3 IMPLANT
SET IRRIG TUBING LAPAROSCOPIC (IRRIGATION / IRRIGATOR) ×3 IMPLANT
SET TUBE SMOKE EVAC HIGH FLOW (TUBING) ×3 IMPLANT
SHEARS HARMONIC ACE PLUS 45CM (MISCELLANEOUS) ×3 IMPLANT
SLEEVE ADV FIXATION 5X100MM (TROCAR) ×6 IMPLANT
SLEEVE GASTRECTOMY 40FR VISIGI (MISCELLANEOUS) ×3 IMPLANT
SOL ANTI FOG 6CC (MISCELLANEOUS) ×2 IMPLANT
SOLUTION ANTI FOG 6CC (MISCELLANEOUS) ×1
SPONGE LAP 18X18 RF (DISPOSABLE) ×3 IMPLANT
STAPLER ECHELON BIOABSB 60 FLE (MISCELLANEOUS) IMPLANT
STAPLER ECHELON LONG 60 440 (INSTRUMENTS) ×3 IMPLANT
STAPLER RELOAD BLUE 60MM (STAPLE) ×9
STAPLER RELOAD GOLD 60MM (STAPLE) ×6
STAPLER RELOAD GREEN 60MM (STAPLE) ×3
STRIP CLOSURE SKIN 1/2X4 (GAUZE/BANDAGES/DRESSINGS) ×3 IMPLANT
SUT MNCRL AB 4-0 PS2 18 (SUTURE) ×3 IMPLANT
SUT SURGIDAC NAB ES-9 0 48 120 (SUTURE) IMPLANT
SUT VICRYL 0 TIES 12 18 (SUTURE) ×3 IMPLANT
SYR 10ML ECCENTRIC (SYRINGE) ×3 IMPLANT
SYR 20ML LL LF (SYRINGE) ×3 IMPLANT
SYR 50ML LL SCALE MARK (SYRINGE) ×3 IMPLANT
TOWEL OR 17X26 10 PK STRL BLUE (TOWEL DISPOSABLE) ×3 IMPLANT
TOWEL OR NON WOVEN STRL DISP B (DISPOSABLE) ×3 IMPLANT
TROCAR ADV FIXATION 5X100MM (TROCAR) ×3 IMPLANT
TROCAR BLADELESS 15MM (ENDOMECHANICALS) ×3 IMPLANT
TROCAR BLADELESS OPT 5 100 (ENDOMECHANICALS) ×3 IMPLANT
TROCAR XCEL 12X100 BLDLESS (ENDOMECHANICALS) ×3 IMPLANT
TUBING CONNECTING 10 (TUBING) ×3 IMPLANT
TUBING ENDO SMARTCAP (MISCELLANEOUS) ×3 IMPLANT

## 2020-04-24 NOTE — Anesthesia Postprocedure Evaluation (Signed)
Anesthesia Post Note  Patient: Najat Olazabal  Procedure(s) Performed: LAPAROSCOPIC GASTRIC SLEEVE RESECTION (N/A Abdomen) UPPER GI ENDOSCOPY (N/A )     Patient location during evaluation: PACU Anesthesia Type: General Level of consciousness: awake and alert Pain management: pain level controlled Vital Signs Assessment: post-procedure vital signs reviewed and stable Respiratory status: spontaneous breathing, nonlabored ventilation, respiratory function stable and patient connected to nasal cannula oxygen Cardiovascular status: blood pressure returned to baseline and stable Postop Assessment: no apparent nausea or vomiting Anesthetic complications: no   No complications documented.  Last Vitals:  Vitals:   04/24/20 1336 04/24/20 1427  BP: (!) 141/74 (!) 158/117  Pulse: 88 88  Resp: 18 18  Temp: (!) 36.3 C (!) 36.3 C  SpO2: 94% 99%    Last Pain:  Vitals:   04/24/20 1427  TempSrc: Oral  PainSc:                  Effie Berkshire

## 2020-04-24 NOTE — Progress Notes (Signed)
Discussed post op day goals with patient including ambulation, IS, diet progression, pain, and nausea control.  BSTOP education provided including BSTOP information guide, "Guide for Pain Management after your Bariatric Procedure".  Questions answered. 

## 2020-04-24 NOTE — Interval H&P Note (Signed)
History and Physical Interval Note:  04/24/2020 7:02 AM  Carla Sloan  has presented today for surgery, with the diagnosis of MORBID OBESITY.  The various methods of treatment have been discussed with the patient and family. After consideration of risks, benefits and other options for treatment, the patient has consented to  Procedure(s): LAPAROSCOPIC GASTRIC SLEEVE RESECTION (N/A) UPPER GI ENDOSCOPY (N/A) as a surgical intervention.  The patient's history has been reviewed, patient examined, no change in status, stable for surgery.  I have reviewed the patient's chart and labs.  Questions were answered to the patient's satisfaction.     Codi Folkerts Rich Brave

## 2020-04-24 NOTE — Op Note (Signed)
Carla Sloan [id number] 1960-08-26 04/24/2020  Preoperative diagnosis: severe obesity  Postoperative diagnosis: Same   Procedure: upper endoscopy   Surgeon: Leighton Ruff. Carla Sloan M.D., FACS   Anesthesia: Gen.   Indications for procedure: 60 y.o. year old female undergoing Laparoscopic Gastric Sleeve Resection and an EGD was requested to evaluate the new gastric sleeve.   Description of procedure: After we have completed the sleeve resection, I scrubbed out and obtained the Olympus endoscope. I gently placed endoscope in the patient's oropharynx and gently glided it down the esophagus without any difficulty under direct visualization. Once I was in the gastric sleeve, I insufflated the stomach with air. I was able to cannulate and advanced the scope through the gastric sleeve. I was able to cannulate the duodenum with ease. Dr. Kae Heller had placed saline in the upper abdomen. Upon further insufflation of the gastric sleeve there was no evidence of bubbles. GE junction located at 39 cm.  Upon further inspection of the gastric sleeve, the mucosa appeared normal. There is no evidence of any mucosal abnormality. The sleeve was widely patent at the angularis. There was no evidence of bleeding. The gastric sleeve was decompressed. The scope was withdrawn. The patient tolerated this portion of the procedure well. Please see Dr Ron Parker operative note for details regarding the laparoscopic gastric sleeve resection.   Leighton Ruff. Redmond Pulling, MD, FACS  General, Bariatric, & Minimally Invasive Surgery  [facility], [place]

## 2020-04-24 NOTE — Op Note (Signed)
Operative Note  Aundria Bitterman  [id number]  [id number]  04/24/2020   Surgeon: Romana Juniper MD   Assistant: Greer Pickerel MD   Procedure performed: laparoscopic sleeve gastrectomy, upper endoscopy   Preop diagnosis: Morbid obesity Body mass index is 59.3 kg/m. Post-op diagnosis/intraop findings: same   Specimens: fundus Retained items: none  EBL: minimal  Complications: none   Description of procedure: After obtaining informed consent and administration of chemical DVT prophylaxis in holding, the patient was taken to the operating room and placed supine on operating room table where general endotracheal anesthesia was initiated, preoperative antibiotics were administered, SCDs applied, and a formal timeout was performed. The abdomen was prepped and draped in usual sterile fashion. Peritoneal access was gained using a Visiport technique in the left upper quadrant and insufflation to 15 mmHg ensued without issue. Gross inspection revealed no evidence of injury.  There are filmy adhesions throughout the upper abdomen between the liver and the diaphragm as well as between the omentum and stomach and left abdominal wall and diaphragm.  Under direct visualization three more 5 mm trochars were placed in the right and left hemiabdomen and the 55mm trocar in the right paramedian upper abdomen.  Some of the upper abdominal adhesions were taken down to allow for placement of the liver retractor and to mobilize the omentum off the lateral abdominal wall.  Bilateral laparoscopic assisted TAPS blocks were performed with Exparel diluted with 0.25 percent Marcaine with epinephrine. The patient was placed in steep Trendelenburg and the liver retractor was introduced through an incision in the upper midline and secured to the post externally to maintain the left lobe retracted anteriorly.  There was no hiatal hernia, however the patient has an elevated left hemidiaphragm and adhesions of the omentum which  tethered the stomach up towards the left upper quadrant causing an almost S shaped stomach.  The pylorus is essentially in the midline.  I broke scrub and performed upper endoscopy noted that the lesser curve was fairly normally oriented but that the fundus is indeed tethered upwards.  We passed the VISI G down and it appeared that once the fundus of her is removed, the orientation and anatomy of the stomach would be within the normal appearance for sleeve. Using the Harmonic scalpel, the greater curvature of the stomach was dissected away from the greater omentum and short gastric vessels were divided. This began 6 cm from the pylorus, and dissection proceeded until the left crus was clearly exposed. There were some filmy adhesions of the posterior stomach to the pancreas which were divided with the Harmonic. The 58 [place] VisiGi was then introduced and directed down towards the pylorus. This was placed to suction against the lesser curve. Serial fires of the linear cutting stapler were then employed to create our sleeve. The first fire used a green load and ensured adequate room at the angularis incisura. One gold load and then several blue loads were then employed to create a narrow tubular stomach up to the angle of His.  We did upsize the left midclavicular port site to a 12 to afford a better angle for stapling. The excised stomach was then removed through our 15 mm trocar site within an Endo Catch bag.  The visigi was taken off of suction and a few puffs of air were introduced, inflating the sleeve. No bubbles were observed in the irrigation fluid around the stomach and the shape was noted to be evenly tubular without any narrowing at the angularis. The  visigi was then removed.  There were several points of oozing along the staple line which were addressed with clips, including the proximal last 2 staple fires.   Upper endoscopy was performed by the assistant surgeon and the sleeve was noted to be airtight,  the staple line was hemostatic, and the lumen easily traversable with no unusual angulation or undue narrowing. Please see his separate note. The endoscope was removed. The 15 mm trocar site fascia in the right upper abdomen was closed with a 0 Vicryl using the laparoscopic suture passer under direct visualization. The liver retractor was removed under direct visualization. The abdomen was then desufflated and all remaining trochars removed. The skin incisions were closed with subcuticular Monocryl; benzoin, Steri-Strips and Band-Aids were applied The patient was then awakened, extubated and taken to PACU in stable condition.     All counts were correct at the completion of the case.

## 2020-04-24 NOTE — Anesthesia Procedure Notes (Signed)
Procedure Name: Intubation Date/Time: 04/24/2020 7:27 AM Performed by: Niel Hummer, CRNA Pre-anesthesia Checklist: Patient identified, Emergency Drugs available, Suction available and Patient being monitored Patient Re-evaluated:Patient Re-evaluated prior to induction Oxygen Delivery Method: Circle system utilized Preoxygenation: Pre-oxygenation with 100% oxygen Induction Type: IV induction Ventilation: Mask ventilation without difficulty Laryngoscope Size: Mac and 4 Grade View: Grade I Tube type: Oral Tube size: 7.0 mm Number of attempts: 1 Placement Confirmation: ETT inserted through vocal cords under direct vision,  positive ETCO2 and breath sounds checked- equal and bilateral Secured at: 22 cm Tube secured with: Tape Dental Injury: Teeth and Oropharynx as per pre-operative assessment

## 2020-04-24 NOTE — Anesthesia Preprocedure Evaluation (Addendum)
Anesthesia Evaluation  Patient identified by MRN, date of birth, ID band Patient awake    Reviewed: Allergy & Precautions, NPO status , Patient's Chart, lab work & pertinent test results  Airway Mallampati: II  TM Distance: >3 FB Neck ROM: Full    Dental  (+) Missing, Poor Dentition, Dental Advisory Given, Chipped,    Pulmonary sleep apnea ,    breath sounds clear to auscultation       Cardiovascular hypertension,  Rhythm:Regular Rate:Normal     Neuro/Psych PSYCHIATRIC DISORDERS Depression negative neurological ROS     GI/Hepatic negative GI ROS, Neg liver ROS,   Endo/Other  negative endocrine ROS  Renal/GU negative Renal ROS     Musculoskeletal  (+) Arthritis ,   Abdominal (+) + obese,   Peds  Hematology negative hematology ROS (+)   Anesthesia Other Findings   Reproductive/Obstetrics                            Anesthesia Physical Anesthesia Plan  ASA: III  Anesthesia Plan: General   Post-op Pain Management:    Induction: Intravenous, Rapid sequence and Cricoid pressure planned  PONV Risk Score and Plan: 4 or greater and Ondansetron, Midazolam, Dexamethasone and Scopolamine patch - Pre-op  Airway Management Planned: Oral ETT  Additional Equipment: None  Intra-op Plan:   Post-operative Plan: Extubation in OR  Informed Consent: I have reviewed the patients History and Physical, chart, labs and discussed the procedure including the risks, benefits and alternatives for the proposed anesthesia with the patient or authorized representative who has indicated his/her understanding and acceptance.     Dental advisory given  Plan Discussed with: CRNA  Anesthesia Plan Comments: (Lab Results      Component                Value               Date                      WBC                      5.8                 04/16/2020                HGB                      14.1                 04/16/2020                HCT                      44.6                04/16/2020                MCV                      90.8                04/16/2020                PLT                      339  04/16/2020            Lab Results      Component                Value               Date                      CREATININE               0.71                04/16/2020                BUN                      16                  04/16/2020                NA                       140                 04/16/2020                K                        4.8                 04/16/2020                CL                       103                 04/16/2020                CO2                      25                  04/16/2020            EKG: normal sinus rhythm.)       Anesthesia Quick Evaluation

## 2020-04-24 NOTE — Transfer of Care (Signed)
Immediate Anesthesia Transfer of Care Note  Patient: Carla Sloan  Procedure(s) Performed: LAPAROSCOPIC GASTRIC SLEEVE RESECTION (N/A Abdomen) UPPER GI ENDOSCOPY (N/A )  Patient Location: PACU  Anesthesia Type:General  Level of Consciousness: awake, alert  and oriented  Airway & Oxygen Therapy: Patient Spontanous Breathing and Patient connected to face mask oxygen  Post-op Assessment: Report given to RN, Post -op Vital signs reviewed and stable and Patient moving all extremities X 4  Post vital signs: Reviewed and stable  Last Vitals:  Vitals Value Taken Time  BP 148/93 04/24/20 0954  Temp    Pulse 76 04/24/20 0955  Resp 18 04/24/20 0955  SpO2 100 % 04/24/20 0955  Vitals shown include unvalidated device data.  Last Pain:  Vitals:   04/24/20 0541  TempSrc:   PainSc: 0-No pain         Complications: No complications documented.

## 2020-04-24 NOTE — Progress Notes (Signed)
PHARMACY CONSULT FOR:  Risk Assessment for Post-Discharge VTE Following Bariatric Surgery  Post-Discharge VTE Risk Assessment: This patient's probability of 30-day post-discharge VTE is increased due to the factors marked:   Female    Age >/=60 years   x BMI >/=50 kg/m2    CHF    Dyspnea at Rest    Paraplegia  x  Non-gastric-band surgery    Operation Time >/=3 hr    Return to OR     Length of Stay >/= 3 d   Hx of VTE   Hypercoagulable condition   Significant venous stasis       Predicted probability of 30-day post-discharge VTE: - 0.27%  Other patient-specific factors to consider: - no noted history of PE/DVT    Recommendation for Discharge: No pharmacologic prophylaxis post-discharge        Carla Sloan is a 60 y.o. female who underwent  laparoscopic sleeve gastrectomy(procedure) on 04/24/20   Case start: 0754 Case end: 0942   No Known Allergies  Patient Measurements: Weight: (!) 166.7 kg (367 lb 6.4 oz) Body mass index is 59.3 kg/m.  No results for input(s): WBC, HGB, HCT, PLT, APTT, CREATININE, LABCREA, CREATININE, CREAT24HRUR, MG, PHOS, ALBUMIN, PROT, ALBUMIN, AST, ALT, ALKPHOS, BILITOT, BILIDIR, IBILI in the last 72 hours. Estimated Creatinine Clearance: 123.8 mL/min (by C-G formula based on SCr of 0.71 mg/dL).    Past Medical History:  Diagnosis Date  . Arthritis   . Depression   . Hypertension   . Muscle spasms of lower extremity    and back  . Sleep apnea      Medications Prior to Admission  Medication Sig Dispense Refill Last Dose  . acetaminophen (TYLENOL) 500 MG tablet Take 1,000 mg by mouth at bedtime.   04/23/2020 at Unknown time  . amLODipine (NORVASC) 10 MG tablet Take 10 mg by mouth daily.   04/23/2020 at Unknown time  . azelastine (OPTIVAR) 0.05 % ophthalmic solution Place 1 drop into both eyes 2 (two) times daily.   04/24/2020 at Unknown time  . bimatoprost (LUMIGAN) 0.03 % ophthalmic solution Place 1 drop into both eyes at  bedtime.   04/24/2020 at Unknown time  . brimonidine-timolol (COMBIGAN) 0.2-0.5 % ophthalmic solution Place 1 drop into both eyes every 12 (twelve) hours.   04/24/2020 at Unknown time  . calcium citrate-vitamin D (CELEBRATE CALCIUM CITRATE) 500-500 MG-UNIT chewable tablet Chew 1 tablet by mouth every evening.   Past Week at Unknown time  . CEQUA 0.09 % SOLN Place 1 drop into both eyes 2 (two) times daily.   Past Week at Unknown time  . citalopram (CELEXA) 40 MG tablet Take 40 mg by mouth daily.   04/24/2020 at Unknown time  . cyclobenzaprine (FLEXERIL) 5 MG tablet Take 5 mg by mouth 3 (three) times daily.   04/23/2020 at Unknown time  . ergocalciferol (VITAMIN D2) 1.25 MG (50000 UT) capsule Take 50,000 Units by mouth once a week.   Past Week at Unknown time  . ferrous sulfate 324 MG TBEC Take 324 mg by mouth daily with breakfast.   04/23/2020 at Unknown time  . gabapentin (NEURONTIN) 800 MG tablet Take 800-1,600 mg by mouth 2 (two) times daily. Take 800 mg in the morning and 1600 mg at bedtime   04/24/2020 at Unknown time  . hydrochlorothiazide (HYDRODIURIL) 25 MG tablet Take 25 mg by mouth daily.   04/23/2020 at Unknown time  . hydrOXYzine (ATARAX/VISTARIL) 25 MG tablet Take 1-2 tablets (25-50 mg total) by  mouth at bedtime as needed. Take for insomnia. (Patient taking differently: Take 100 mg by mouth at bedtime. Take for insomnia.) 60 tablet 0 04/23/2020 at Unknown time  . Lurasidone HCl (LATUDA) 60 MG TABS Take 60 mg by mouth at bedtime.   04/23/2020 at Unknown time  . Multiple Vitamins-Minerals (CELEBRATE MULTI-COMPLETE 45) CHEW Chew 1 each by mouth daily.   04/23/2020 at Unknown time  . pravastatin (PRAVACHOL) 20 MG tablet Take 20 mg by mouth daily.   04/23/2020 at Unknown time  . Elastic Bandages & Supports (WRIST SPLINT/COCK-UP/LEFT L) MISC Wear nightly and with exacerbating activities. 1 each 0   . Elastic Bandages & Supports (WRIST SPLINT/COCK-UP/RIGHT L) MISC Wear nightly and with exacerbating activities.  1 each 0        Royetta Asal, PharmD, BCPS 04/24/2020 1:28 PM

## 2020-04-25 ENCOUNTER — Encounter (HOSPITAL_COMMUNITY): Payer: Self-pay | Admitting: Surgery

## 2020-04-25 LAB — CBC WITH DIFFERENTIAL/PLATELET
Abs Immature Granulocytes: 0.03 10*3/uL (ref 0.00–0.07)
Basophils Absolute: 0 10*3/uL (ref 0.0–0.1)
Basophils Relative: 0 %
Eosinophils Absolute: 0 10*3/uL (ref 0.0–0.5)
Eosinophils Relative: 0 %
HCT: 39.5 % (ref 36.0–46.0)
Hemoglobin: 12.4 g/dL (ref 12.0–15.0)
Immature Granulocytes: 0 %
Lymphocytes Relative: 12 %
Lymphs Abs: 1.1 10*3/uL (ref 0.7–4.0)
MCH: 28.2 pg (ref 26.0–34.0)
MCHC: 31.4 g/dL (ref 30.0–36.0)
MCV: 89.8 fL (ref 80.0–100.0)
Monocytes Absolute: 0.5 10*3/uL (ref 0.1–1.0)
Monocytes Relative: 5 %
Neutro Abs: 7.8 10*3/uL — ABNORMAL HIGH (ref 1.7–7.7)
Neutrophils Relative %: 83 %
Platelets: 288 10*3/uL (ref 150–400)
RBC: 4.4 MIL/uL (ref 3.87–5.11)
RDW: 14 % (ref 11.5–15.5)
WBC: 9.4 10*3/uL (ref 4.0–10.5)
nRBC: 0 % (ref 0.0–0.2)

## 2020-04-25 LAB — COMPREHENSIVE METABOLIC PANEL
ALT: 25 U/L (ref 0–44)
AST: 28 U/L (ref 15–41)
Albumin: 3.6 g/dL (ref 3.5–5.0)
Alkaline Phosphatase: 61 U/L (ref 38–126)
Anion gap: 13 (ref 5–15)
BUN: 19 mg/dL (ref 6–20)
CO2: 20 mmol/L — ABNORMAL LOW (ref 22–32)
Calcium: 9.4 mg/dL (ref 8.9–10.3)
Chloride: 104 mmol/L (ref 98–111)
Creatinine, Ser: 0.62 mg/dL (ref 0.44–1.00)
GFR, Estimated: >60 mL/min (ref >60)
Glucose, Bld: 114 mg/dL — ABNORMAL HIGH (ref 70–99)
Potassium: 4.1 mmol/L (ref 3.5–5.1)
Sodium: 137 mmol/L (ref 135–145)
Total Bilirubin: 0.5 mg/dL (ref 0.3–1.2)
Total Protein: 7.2 g/dL (ref 6.5–8.1)

## 2020-04-25 LAB — MAGNESIUM: Magnesium: 1.7 mg/dL (ref 1.7–2.4)

## 2020-04-25 LAB — SURGICAL PATHOLOGY

## 2020-04-25 MED ORDER — ONDANSETRON 4 MG PO TBDP: 4.0000 mg | ORAL_TABLET | Freq: Four times a day (QID) | ORAL | 0 refills | Status: DC | PRN

## 2020-04-25 MED ORDER — ACETAMINOPHEN 500 MG PO TABS: 1000.0000 mg | ORAL_TABLET | Freq: Three times a day (TID) | ORAL | 0 refills | Status: AC

## 2020-04-25 MED ORDER — PANTOPRAZOLE SODIUM 40 MG PO TBEC: 40.0000 mg | DELAYED_RELEASE_TABLET | Freq: Every day | ORAL | 0 refills | Status: DC

## 2020-04-25 MED ORDER — AMLODIPINE BESYLATE 5 MG PO TABS
5.0000 mg | ORAL_TABLET | Freq: Every day | ORAL | Status: DC
  Administered 2020-04-25: 5 mg via ORAL
  Filled 2020-04-25: qty 1

## 2020-04-25 MED ORDER — MAGNESIUM SULFATE 50 % IJ SOLN
3.0000 g | Freq: Once | INTRAVENOUS | Status: AC
  Administered 2020-04-25: 07:00:00 3 g via INTRAVENOUS
  Filled 2020-04-25: qty 6

## 2020-04-25 MED ORDER — AMLODIPINE BESYLATE 10 MG PO TABS: 5.0000 mg | ORAL_TABLET | Freq: Every day | ORAL | 0 refills | Status: DC

## 2020-04-25 MED ORDER — TRAMADOL HCL 50 MG PO TABS: 50.0000 mg | ORAL_TABLET | Freq: Four times a day (QID) | ORAL | 0 refills | Status: DC | PRN

## 2020-04-25 NOTE — Progress Notes (Signed)
Nutrition Education Note ° °Received consult for diet education for patient s/p bariatric surgery. ° °Discussed 2 week post op diet with pt. Emphasized that liquids must be non carbonated, non caffeinated, and sugar free. Fluid goals discussed. Pt to follow up with outpatient bariatric RD for further diet progression after 2 weeks. Multivitamins and minerals also reviewed. Teach back method used, pt expressed understanding, expect good compliance. ° °If nutrition issues arise, please consult RD. ° °Lindsey Baker, MS, RD, LDN °Inpatient Clinical Dietitian °Contact information available via Amion ° ° °

## 2020-04-25 NOTE — Progress Notes (Signed)
Patient alert and oriented, pain is controlled. Patient is tolerating fluids, advanced to protein shake today, patient is tolerating well.  Reviewed Gastric sleeve discharge instructions with patient and patient is able to articulate understanding.  Provided information on BELT program, Support Group and [facility] outpatient pharmacy. All questions answered, will continue to monitor.  Total 24 hour fluid recall is 1350mL. Per dehydration protocol, will call pt to f/u within one week post op.

## 2020-04-25 NOTE — Progress Notes (Signed)
Pt was discharged home today. Instructions were reviewed with patient, and questions were answered. Pt was taken to main entrance via wheelchair by NT.  

## 2020-04-25 NOTE — Progress Notes (Signed)
S: She had a good night.  Reports pain is well controlled.  Denies nausea.  Tolerating liquids without issue.  Walking the halls.  O: Vitals, labs, intake/output, and orders reviewed at this time.  Afebrile, no tachycardia, normotensive, sats 94% on room air.  P.o. 1080 yesterday, 240 so far today.  Urine output 2150.  CMP and CBC unremarkable.  Magnesium 1.7.  White count 9.4, hemoglobin 12.4 (14.1 preop), platelets 288 (339)   Gen: A&Ox3, no distress  H&N: EOMI, atraumatic, neck supple Chest: unlabored respirations, RRR Abd: soft, nontender, nondistended, incision(s) c/d/i no cellulitis or hematoma Ext: warm, no edema Neuro: grossly normal  Lines/tubes/drains: PIV  A/P: Postop day 1 status post sleeve gastrectomy, doing great -Continue clear liquids and protein shakes -Continue ambulation, SCDs, chemical DVT prophylaxis -Continue pulmonary toilet -Replace magnesium IV -Discharge later today.   Romana Juniper, MD [facility], [place]

## 2020-04-25 NOTE — Discharge Summary (Signed)
Physician Discharge Summary  Syndi Pua NUU:[id number] DOB: 1960/03/22 DOA: 04/24/2020  PCP: Nolene Ebbs, MD  Admit date: 04/24/2020 Discharge date: 04/25/2020   Recommendations for Outpatient Follow-up:    Follow-up Information     Clovis Riley, MD. Go on 05/16/2020.   Specialty: General Surgery Why: at 1:40pm. Please arrive 15 minutes prior to your appointment. Thank you. Contact information: [address] [place] [address] [phone]         Surgery, [place]. Go on 06/14/2020.   Specialty: General Surgery Why: at 10:50am for Dr. Kae Heller.  Please arrive 15 minutes prior to your appointment time. Thank you. Contact information: [place] [address] [phone]                Discharge Diagnoses:  Active Problems:   Morbid obesity ([place])   Surgical Procedure: Laparoscopic Sleeve Gastrectomy, upper endoscopy  Discharge Condition: Good Disposition: Home  Diet recommendation: Postoperative sleeve gastrectomy diet (liquids only)  Filed Weights   04/24/20 0500  Weight: (!) 166.7 kg     Hospital Course:  The patient was admitted for a planned laparoscopic sleeve gastrectomy. Please see operative note. Preoperatively the patient was given 5000 units of subcutaneous heparin for DVT prophylaxis. Postoperative prophylactic Lovenox dosing was started on the evening of postoperative day 0. ERAS protocol was used. On the evening of postoperative day 0, the patient was started on water and ice chips. On postoperative day 1 the patient had no fever or tachycardia and was tolerating water and their diet was gradually advanced throughout the day. The patient was ambulating without difficulty. Their vital signs are stable without fever or tachycardia. Their hemoglobin had remained stable. The patient was maintained on their home settings for CPAP therapy. The patient had received discharge instructions  and counseling. They were deemed stable for discharge and had met discharge criteria   Discharge Instructions    Allergies as of 04/25/2020   No Known Allergies      Medication List     STOP taking these medications    hydrochlorothiazide 25 MG tablet Commonly known as: HYDRODIURIL       TAKE these medications    acetaminophen 500 MG tablet Commonly known as: TYLENOL Take 1,000 mg by mouth at bedtime. What changed: Another medication with the same name was added. Make sure you understand how and when to take each. Notes to patient: You can resume this medication when you are through taking the scheduled Tylenol as ordered below   acetaminophen 500 MG tablet Commonly known as: TYLENOL Take 2 tablets (1,000 mg total) by mouth every 8 (eight) hours for 5 days. What changed: You were already taking a medication with the same name, and this prescription was added. Make sure you understand how and when to take each.   amLODipine 10 MG tablet Commonly known as: NORVASC Take 0.5 tablets (5 mg total) by mouth daily. Monitor your blood pressure daily-you may need to decrease or stop this medication fairly soon after bariatric surgery.  Report to your primary care doctor to titrate this medication. What changed:  how much to take additional instructions Notes to patient: Monitor Blood Pressure Daily and keep a log for primary care physician.  You may need to make changes to your medications with rapid weight loss.     azelastine 0.05 % ophthalmic solution Commonly known as: OPTIVAR Place 1 drop into both eyes 2 (two) times daily.   bimatoprost  0.03 % ophthalmic solution Commonly known as: LUMIGAN Place 1 drop into both eyes at bedtime.   Celebrate Calcium Citrate 500-500 MG-UNIT chewable tablet Generic drug: calcium citrate-vitamin D Chew 1 tablet by mouth every evening.   Celebrate Multi-Complete 45 Chew Chew 1 each by mouth daily.   Cequa 0.09 % Soln Generic drug:  cycloSPORINE (PF) Place 1 drop into both eyes 2 (two) times daily.   citalopram 40 MG tablet Commonly known as: CELEXA Take 40 mg by mouth daily.   Combigan 0.2-0.5 % ophthalmic solution Generic drug: brimonidine-timolol Place 1 drop into both eyes every 12 (twelve) hours.   cyclobenzaprine 5 MG tablet Commonly known as: FLEXERIL Take 5 mg by mouth 3 (three) times daily.   ergocalciferol 1.25 MG (50000 UT) capsule Commonly known as: VITAMIN D2 Take 50,000 Units by mouth once a week.   ferrous sulfate 324 MG Tbec Take 324 mg by mouth daily with breakfast.   gabapentin 800 MG tablet Commonly known as: NEURONTIN Take 800-1,600 mg by mouth 2 (two) times daily. Take 800 mg in the morning and 1600 mg at bedtime   hydrOXYzine 25 MG tablet Commonly known as: ATARAX/VISTARIL Take 1-2 tablets (25-50 mg total) by mouth at bedtime as needed. Take for insomnia. What changed:  how much to take when to take this   Latuda 60 MG Tabs Generic drug: Lurasidone HCl Take 60 mg by mouth at bedtime.   ondansetron 4 MG disintegrating tablet Commonly known as: ZOFRAN-ODT Take 1 tablet (4 mg total) by mouth every 6 (six) hours as needed for nausea or vomiting.   pantoprazole 40 MG tablet Commonly known as: PROTONIX Take 1 tablet (40 mg total) by mouth daily.   pravastatin 20 MG tablet Commonly known as: PRAVACHOL Take 20 mg by mouth daily.   traMADol 50 MG tablet Commonly known as: ULTRAM Take 1 tablet (50 mg total) by mouth every 6 (six) hours as needed (pain).   Wrist Splint/Cock-Up/Left L Misc Wear nightly and with exacerbating activities.   Wrist Splint/Cock-Up/Right L Misc Wear nightly and with exacerbating activities.        Follow-up Information     Clovis Riley, MD. Go on 05/16/2020.   Specialty: General Surgery Why: at 1:40pm. Please arrive 15 minutes prior to your appointment. Thank you. Contact information: [address] [place]  [address] [phone]         Surgery, [place]. Go on 06/14/2020.   Specialty: General Surgery Why: at 10:50am for Dr. Kae Heller.  Please arrive 15 minutes prior to your appointment time. Thank you. Contact information: [place] [address] [phone]                  The results of significant diagnostics from this hospitalization (including imaging, microbiology, ancillary and laboratory) are listed below for reference.    Significant Diagnostic Studies: No results found.  Labs: Basic Metabolic Panel: Recent Labs  Lab 04/25/20 0426  NA 137  K 4.1  CL 104  CO2 20*  GLUCOSE 114*  BUN 19  CREATININE 0.62  CALCIUM 9.4  MG 1.7   Liver Function Tests: Recent Labs  Lab 04/25/20 0426  AST 28  ALT 25  ALKPHOS 61  BILITOT 0.5  PROT 7.2  ALBUMIN 3.6    CBC: Recent Labs  Lab 04/25/20 0426  WBC 9.4  NEUTROABS 7.8*  HGB 12.4  HCT 39.5  MCV 89.8  PLT 288  CBG: No results for input(s): GLUCAP in the last 168 hours.  Active Problems:   Morbid obesity ([place])   Signed:  Clovis Riley, [facility], [place] 04/25/2020, 12:54 PM

## 2020-04-25 NOTE — Plan of Care (Signed)

## 2020-04-25 NOTE — Discharge Instructions (Signed)
GASTRIC BYPASS / SLEEVE  Home Care Instructions  These instructions are to help you care for yourself when you go home.  Call: If you have any problems. . Call [phone] and ask for the surgeon on call . If you have an emergency related to your surgery please use the ER at [facility].  . Tell the ER staff that you are a new post-op gastric bypass or gastric sleeve patient   Signs and symptoms to report: . Severe vomiting or nausea o If you cannot handle clear liquids for longer than 1 day, call your surgeon  . Abdominal pain which does not get better after taking your pain medication . Fever greater than 100.4 F and chills . Heart rate over 100 beats a minute . Trouble breathing . Chest pain .  Redness, swelling, drainage, or foul odor at incision (surgical) sites .  If your incisions open or pull apart . Swelling or pain in calf (lower leg) . Diarrhea (Loose bowel movements that happen often), frequent watery, uncontrolled bowel movements . Constipation, (no bowel movements for 3 days) if this happens:  o Take Milk of Magnesia, 2 tablespoons by mouth, 3 times a day for 2 days if needed o Stop taking Milk of Magnesia once you have had a bowel movement o Call your doctor if constipation continues Or o Take Miralax  (instead of Milk of Magnesia) following the label instructions o Stop taking Miralax once you have had a bowel movement o Call your doctor if constipation continues . Anything you think is "abnormal for you"   Normal side effects after surgery: . Unable to sleep at night or unable to concentrate . Irritability . Being tearful (crying) or depressed These are common complaints, possibly related to your anesthesia, stress of surgery and change in lifestyle, that usually go away a few weeks after surgery.  If these feelings continue, call your medical doctor.  Wound Care: You may have surgical glue, steri-strips, or staples over your incisions after surgery . Surgical  glue:  Looks like a clear film over your incisions and will wear off a little at a time . Steri-strips : Adhesive strips of tape over your incisions. You may notice a yellowish color on the skin under the steri-strips. This is used to make the   steri-strips stick better. Do not pull the steri-strips off - let them fall off . Staples: Staples may be removed before you leave the hospital o If you go home with staples, call [facility] at for an appointment with your surgeon's nurse to have staples removed 10 days after surgery, [phone] . Showering: You may shower two (2) days after your surgery unless your surgeon tells you differently o Wash gently around incisions with warm soapy water, rinse well, and gently pat dry  o If you have a drain (tube from your incision), you may need someone to hold this while you shower  o No tub baths until staples are removed and incisions are healed     Medications: . Medications should be liquid or crushed if larger than the size of a dime . Extended release pills (medication that releases a little bit at a time through the day) should not be crushed . Depending on the size and number of medications you take, you may need to space (take a few throughout the day)/change the time you take your medications so that you do not over-fill your pouch (smaller stomach) . Make sure you follow-up with   your primary care physician to make medication changes needed during rapid weight loss and life-style changes . If you have diabetes, follow up with the doctor that orders your diabetes medication(s) within one week after surgery and check your blood sugar regularly. . Do not drive while taking narcotics (pain medications) . DO NOT take NSAID'S (Examples of NSAID's include ibuprofen, naproxen)  Diet:                    First 2 Weeks  You will see the nutritionist about two (2) weeks after your surgery. The nutritionist will increase the types of foods you can  eat if you are handling liquids well: . If you have severe vomiting or nausea and cannot handle clear liquids lasting longer than 1 day, call your surgeon  Protein Shake . Drink at least 2 ounces of shake 5-6 times per day . Each serving of protein shakes (usually 8 - 12 ounces) should have a minimum of:  o 15 grams of protein  o And no more than 5 grams of carbohydrate  . Goal for protein each day: o Men = 80 grams per day o Women = 60 grams per day . Protein powder may be added to fluids such as non-fat milk or Lactaid milk or Soy milk (limit to 35 grams added protein powder per serving)  Hydration . Slowly increase the amount of water and other clear liquids as tolerated (See Acceptable Fluids) . Slowly increase the amount of protein shake as tolerated  .  Sip fluids slowly and throughout the day . May use sugar substitutes in small amounts (no more than 6 - 8 packets per day; i.e. Splenda)  Fluid Goal . The first goal is to drink at least 8 ounces of protein shake/drink per day (or as directed by the nutritionist);  See handout from pre-op Bariatric Education Class for examples of protein shake/drink.   o Slowly increase the amount of protein shake you drink as tolerated o You may find it easier to slowly sip shakes throughout the day o It is important to get your proteins in first . Your fluid goal is to drink 64 - 100 ounces of fluid daily o It may take a few weeks to build up to this . 32 oz (or more) should be clear liquids  And  . 32 oz (or more) should be full liquids (see below for examples) . Liquids should not contain sugar, caffeine, or carbonation  Clear Liquids: . Water or Sugar-free flavored water (i.e. Fruit H2O, Propel) . Decaffeinated coffee or tea (sugar-free) . Brice Potteiger Lite, Wyler's Lite, Minute Maid Lite . Sugar-free Jell-O . Bouillon or broth . Sugar-free Popsicle:   *Less than 20 calories each; Limit 1 per day  Full Liquids: Protein Shakes/Drinks + 2  choices per day of other full liquids . Full liquids must be: o No More Than 12 grams of Carbs per serving  o No More Than 3 grams of Fat per serving . Strained low-fat cream soup . Non-Fat milk . Fat-free Lactaid Milk . Sugar-free yogurt (Dannon Lite & Fit, Greek yogurt)      Vitamins and Minerals . Start 1 day after surgery unless otherwise directed by your surgeon . Bariatric Specific Complete Multivitamins . Chewable Calcium Citrate with Vitamin D-3 (Example: 3 Chewable Calcium Plus 600 with Vitamin D-3) o Take 500 mg three (3) times a day for a total of 1500 mg each day o Do not take all 3 doses   of calcium at one time as it may cause constipation, and you can only absorb 500 mg  at a time  o Do not mix multivitamins containing iron with calcium supplements; take 2 hours apart  . Menstruating women and those at risk for anemia (a blood disease that causes weakness) may need extra iron o Talk with your doctor to see if you need more iron . If you need extra iron: Total daily Iron recommendation (including Vitamins) is 50 to 100 mg Iron/day . Do not stop taking or change any vitamins or minerals until you talk to your nutritionist or surgeon . Your nutritionist and/or surgeon must approve all vitamin and mineral supplements   Activity and Exercise: It is important to continue walking at home.  Limit your physical activity as instructed by your doctor.  During this time, use these guidelines: . Do not lift anything greater than ten (10) pounds for at least two (2) weeks . Do not go back to work or drive until your surgeon says you can . You may have sex when you feel comfortable  o It is VERY important for female patients to use a reliable birth control method; fertility often increases after surgery  o Do not get pregnant for at least 18 months . Start exercising as soon as your doctor tells you that you can o Make sure your doctor approves any physical activity . Start with a  simple walking program . Walk 5-15 minutes each day, 7 days per week.  . Slowly increase until you are walking 30-45 minutes per day Consider joining our BELT program. [phone] or email [email or web address]   Special Instructions Things to remember:  . Use your CPAP when sleeping if this applies to you, do not stop the use of CPAP unless directed by physician after a sleep study . [facility] has a free Bariatric Surgery Support Group that meets monthly, the 3rd Thursday, 6 pm.  Please review discharge information for date and location of this meeting. . It is very important to keep all follow up appointments with your surgeon, nutritionist, primary care physician, and behavioral health practitioner o After the first year, please follow up with your bariatric surgeon and nutritionist at least once a year in order to maintain best weight loss results   [facility]: [phone] [facility]: [phone] Bariatric Nurse Coordinator: [phone]      

## 2020-05-01 ENCOUNTER — Telehealth (HOSPITAL_COMMUNITY): Payer: Self-pay | Admitting: *Deleted

## 2020-05-01 NOTE — Telephone Encounter (Signed)
1.  Tell me about your pain and pain management? Pt denies any pain.   2.  Let's talk about fluid intake.  How much total fluid are you taking in? Pt states that s/he is getting in at least 64oz of fluid including protein shakes, bottled water, and decaf tea.   3.  How much protein have you taken in the last 2 days? Pt states s/he is meeting her goal of 60g of protein with the protein shakes.   4.  Have you had nausea?  Tell me about when have experienced nausea and what you did to help? Pt denies nausea.   5.  Has the frequency or color changed with your urine? Pt states that she is urinating "fine" with no changes in frequency or urgency.     6.  Tell me what your incisions look like? "Incisions look good".  Only one incision has "lost its strips".  Pt denies a fever, chills.  Pt states that incisions are not swollen, open, or draining.  Pt encouraged to call [facility] if incisions change.   7.  Have you been passing gas? BM? Pt states that she is having BMs. Last BM 04/30/20.     8.  If a problem or question were to arise who would you call?  Do you know contact numbers for [facility], [facility], and [facility]? Pt denies dehydration symptoms.  Pt can describe s/sx of dehydration.  Pt knows to call [facility] for surgical, [facility] for nutrition, and [facility] for non-urgent questions or concerns.   9.  How has the walking going? Pt states she is walking around and able to be active without difficulty.   10.  How are your vitamins and calcium going?  How are you taking them? Pt states that she is taking her supplements and vitamins without difficulty.

## 2020-05-08 ENCOUNTER — Other Ambulatory Visit: Payer: Self-pay

## 2020-05-08 ENCOUNTER — Encounter: Payer: Medicare HMO | Attending: Surgery | Admitting: Skilled Nursing Facility1

## 2020-05-08 NOTE — Progress Notes (Signed)
2 Week Post-Operative Nutrition Class   Patient was seen on 04/20/18 for Post-Operative Nutrition education at the [facility].    Surgery date: 04/24/2020 Surgery type: sleeve Start weight at [facility]: 411.3 Weight today: 356.7   Body Composition Scale 05/08/2020  Current Body Weight 356.7  Total Body Fat % 52.6  Visceral Fat 28  Fat-Free Mass % 47.3   Total Body Water % 38.1  Muscle-Mass lbs 32.5  BMI 57.6  Body Fat Displacement          Torso  lbs 116.5         Left Leg  lbs 23.3         Right Leg  lbs 23.3         Left Arm  lbs 11.6         Right Arm   lbs 11.6      The following the learning objectives were met by the patient during this course:  Identifies Phase 3 (Soft, High Proteins) Dietary Goals and will begin from 2 weeks post-operatively to 2 months post-operatively  Identifies appropriate sources of fluids and proteins   Identifies appropriate fat sources and healthy verses unhealthy fat types    States protein recommendations and appropriate sources post-operatively  Identifies the need for appropriate texture modifications, mastication, and bite sizes when consuming solids  Identifies appropriate multivitamin and calcium sources post-operatively  Describes the need for physical activity post-operatively and will follow MD recommendations  States when to call healthcare provider regarding medication questions or post-operative complications   Handouts given during class include:  Phase 3A: Soft, High Protein Diet Handout  Phase 3 High Protein Meals  Healthy Fats   Follow-Up Plan: Patient will follow-up at [facility] in 6 weeks for 2 month post-op nutrition visit for diet advancement per MD.

## 2020-05-11 ENCOUNTER — Encounter (HOSPITAL_COMMUNITY): Payer: Self-pay | Admitting: Emergency Medicine

## 2020-05-11 ENCOUNTER — Other Ambulatory Visit: Payer: Self-pay

## 2020-05-11 ENCOUNTER — Ambulatory Visit (HOSPITAL_COMMUNITY)
Admission: EM | Admit: 2020-05-11 | Discharge: 2020-05-11 | Disposition: A | Discharge status: Home / Self Care | Payer: Medicare HMO | Attending: Medical Oncology | Admitting: Medical Oncology

## 2020-05-11 ENCOUNTER — Ambulatory Visit (INDEPENDENT_AMBULATORY_CARE_PROVIDER_SITE_OTHER): Payer: Medicare HMO

## 2020-05-11 DIAGNOSIS — R2241 Localized swelling, mass and lump, right lower limb: Secondary | ICD-10-CM | POA: Diagnosis not present

## 2020-05-11 DIAGNOSIS — M79671 Pain in right foot: Secondary | ICD-10-CM

## 2020-05-11 DIAGNOSIS — M7989 Other specified soft tissue disorders: Secondary | ICD-10-CM

## 2020-05-11 MED ORDER — ACETAMINOPHEN 500 MG PO TABS: 500.0000 mg | ORAL_TABLET | Freq: Four times a day (QID) | ORAL | 0 refills | Status: AC | PRN

## 2020-05-11 NOTE — ED Provider Notes (Signed)
[facility]    CSN: [id number] Arrival date & time: 05/11/20  2355      History   Chief Complaint Chief Complaint  Patient presents with  . Foot Pain    HPI Carla Sloan is a 60 y.o. female.   HPI  Foot Pain: Pt states that she has had a mass of the top of her right foot.  Patient first noticed this on Friday.  She states that the area was bigger and had improved over the weekend but now appears to be growing bigger again.  She is having pain with walking and at rest.  Pain described as sharp and rated 7/10.  No known injury to this area. She does have a history of arthritis.   Past Medical History:  Diagnosis Date  . Arthritis   . Depression   . Hypertension   . Muscle spasms of lower extremity    and back  . Sleep apnea     Patient Active Problem List   Diagnosis Date Noted  . Morbid obesity ([place]) 04/24/2020    Past Surgical History:  Procedure Laterality Date  . COLONOSCOPY WITH PROPOFOL N/A 09/24/2012   Procedure: COLONOSCOPY WITH PROPOFOL;  Surgeon: Jeryl Columbia, MD;  Location: [facility] ENDOSCOPY;  Service: Endoscopy;  Laterality: N/A;  . COLONOSCOPY WITH PROPOFOL N/A 05/19/2016   Procedure: COLONOSCOPY WITH PROPOFOL;  Surgeon: Clarene Essex, MD;  Location: [facility] ENDOSCOPY;  Service: Endoscopy;  Laterality: N/A;  . LAPAROSCOPIC GASTRIC SLEEVE RESECTION N/A 04/24/2020   Procedure: LAPAROSCOPIC GASTRIC SLEEVE RESECTION;  Surgeon: Clovis Riley, MD;  Location: [facility] ORS;  Service: General;  Laterality: N/A;  . NO PAST SURGERIES    . TUBAL LIGATION    . UPPER GI ENDOSCOPY N/A 04/24/2020   Procedure: UPPER GI ENDOSCOPY;  Surgeon: Clovis Riley, MD;  Location: [facility] ORS;  Service: General;  Laterality: N/A;    OB History    Gravida  2   Para  2   Term  2   Preterm      AB      Living  2     SAB      IAB      Ectopic      Multiple      Live Births  2            Home Medications    Prior to Admission medications   Medication Sig  Start Date End Date Taking? Authorizing Provider  acetaminophen (TYLENOL) 500 MG tablet Take 1 tablet (500 mg total) by mouth every 6 (six) hours as needed. 05/11/20  Yes Dion Parrow, Judson Roch M, PA-C  amLODipine (NORVASC) 10 MG tablet Take 0.5 tablets (5 mg total) by mouth daily. Monitor your blood pressure daily-you may need to decrease or stop this medication fairly soon after bariatric surgery.  Report to your primary care doctor to titrate this medication. 04/25/20   Clovis Riley, MD  azelastine (OPTIVAR) 0.05 % ophthalmic solution Place 1 drop into both eyes 2 (two) times daily.    [provider]  bimatoprost (LUMIGAN) 0.03 % ophthalmic solution Place 1 drop into both eyes at bedtime.    [provider]  brimonidine-timolol (COMBIGAN) 0.2-0.5 % ophthalmic solution Place 1 drop into both eyes every 12 (twelve) hours.    [provider]  calcium citrate-vitamin D (CELEBRATE CALCIUM CITRATE) 500-500 MG-UNIT chewable tablet Chew 1 tablet by mouth every evening.    [provider]  CEQUA 0.09 % SOLN  Place 1 drop into both eyes 2 (two) times daily. 11/17/19   [provider]  citalopram (CELEXA) 40 MG tablet Take 40 mg by mouth daily.    [provider]  cyclobenzaprine (FLEXERIL) 5 MG tablet Take 5 mg by mouth 3 (three) times daily. 06/20/13   [provider]  Elastic Bandages & Supports (WRIST SPLINT/COCK-UP/LEFT L) MISC Wear nightly and with exacerbating activities. 10/24/19   Patriciaann Clan, DO  Elastic Bandages & Supports (WRIST SPLINT/COCK-UP/RIGHT L) MISC Wear nightly and with exacerbating activities. 10/24/19   Patriciaann Clan, DO  ergocalciferol (VITAMIN D2) 1.25 MG (50000 UT) capsule Take 50,000 Units by mouth once a week.    [provider]  ferrous sulfate 324 MG TBEC Take 324 mg by mouth daily with breakfast.    [provider]  gabapentin (NEURONTIN) 800 MG tablet Take 800-1,600 mg by mouth 2 (two) times  daily. Take 800 mg in the morning and 1600 mg at bedtime    [provider]  hydrOXYzine (ATARAX/VISTARIL) 25 MG tablet Take 1-2 tablets (25-50 mg total) by mouth at bedtime as needed. Take for insomnia. Patient taking differently: Take 100 mg by mouth at bedtime. Take for insomnia. 05/21/19   Jaynee Eagles, PA-C  Lurasidone HCl (LATUDA) 60 MG TABS Take 60 mg by mouth at bedtime.    [provider]  Multiple Vitamins-Minerals (CELEBRATE MULTI-COMPLETE 46) CHEW Chew 1 each by mouth daily.    [provider]  ondansetron (ZOFRAN-ODT) 4 MG disintegrating tablet Take 1 tablet (4 mg total) by mouth every 6 (six) hours as needed for nausea or vomiting. 04/25/20   Clovis Riley, MD  pantoprazole (PROTONIX) 40 MG tablet Take 1 tablet (40 mg total) by mouth daily. 04/25/20   Clovis Riley, MD  pravastatin (PRAVACHOL) 20 MG tablet Take 20 mg by mouth daily. 12/02/19   [provider]  traMADol (ULTRAM) 50 MG tablet Take 1 tablet (50 mg total) by mouth every 6 (six) hours as needed (pain). 04/25/20   Clovis Riley, MD    Family History Family History  Problem Relation Age of Onset  . Hypertension Mother   . Diabetes Mother   . Breast cancer Mother   . Diabetes Father   . Hypertension Father     Social History Social History   Tobacco Use  . Smoking status: Never Smoker  . Smokeless tobacco: Never Used  Vaping Use  . Vaping Use: Never used  Substance Use Topics  . Alcohol use: No  . Drug use: No     Allergies   Patient has no known allergies.   Review of Systems Review of Systems  As stated above in HPI Physical Exam Triage Vital Signs ED Triage Vitals  Enc Vitals Group     BP 05/11/20 1046 (!) 144/81     Pulse Rate 05/11/20 1046 79     Resp --      Temp 05/11/20 1046 97.6 F (36.4 C)     Temp Source 05/11/20 1046 Oral     SpO2 05/11/20 1046 100 %     Weight --      Height --      Head Circumference --      Peak Flow --      Pain  Score 05/11/20 1044 7     Pain Loc --      Pain Edu? --      Excl. in [place]? --    No data  found.  Updated Vital Signs BP (!) 144/81 (BP Location: Right Wrist)   Pulse 79   Temp 97.6 F (36.4 C) (Oral)   LMP 09/25/2015 (Approximate)   SpO2 100%   Physical Exam Vitals and nursing note reviewed.  Musculoskeletal:        General: Tenderness present. No swelling. Normal range of motion.     Right lower leg: No edema.     Left lower leg: No edema.       Legs:  Skin:    Capillary Refill: Capillary refill takes less than 2 seconds.     Findings: No bruising, erythema or rash.     Comments: No skin lesion of right foot  Neurological:     General: No focal deficit present.     Sensory: No sensory deficit.     Motor: No weakness.     Coordination: Coordination normal.      UC Treatments / Results  Labs (all labs ordered are listed, but only abnormal results are displayed) Labs Reviewed - No data to display  EKG   Radiology DG Foot Complete Right  Result Date: 05/11/2020 CLINICAL DATA:  Mass identified along the superior aspect of the foot. EXAM: RIGHT FOOT COMPLETE - 3+ VIEW COMPARISON:  None FINDINGS: There is no evidence of fracture or dislocation. Degenerative changes with spur formation is noted along the dorsum of the midfoot tarsal bones. Focal subcutaneous soft tissue thickening along the dorsum of the midfoot is identified measuring approximately 1.1 x 0.7 cm. IMPRESSION: Focal, indeterminate subcutaneous soft tissue thickening noted along the dorsum of the midfoot measuring approximately 1.1 x 0.7 cm. More definitive characterization may be obtained with ultrasound or contrast enhanced MR. Electronically Signed   By: Kerby Moors M.D.   On: 05/11/2020 11:29    Procedures Procedures (including critical care time)  Medications Ordered in UC Medications - No data to display  Initial Impression / Assessment and Plan / UC Course  I have reviewed the triage vital  signs and the nursing notes.  Pertinent labs & imaging results that were available during my care of the patient were reviewed by me and considered in my medical decision making (see chart for details).     New.  With her history of arthritis this could represent a ganglion cyst however the examination does not quite support this so an x-ray is needed to rule out other causes that include bony growth or soft tissue mass.   Update: Imaging suggestive of a soft tissue mass. She will need further evaluation and treatment at orthopedics.ACE wrap and pain medication.    Final Clinical Impressions(s) / UC Diagnoses   Final diagnoses:  Right foot pain  Soft tissue mass   Discharge Instructions   None    ED Prescriptions    Medication Sig Dispense Auth. Provider   acetaminophen (TYLENOL) 500 MG tablet Take 1 tablet (500 mg total) by mouth every 6 (six) hours as needed. 30 tablet Hughie Closs, [place]     PDMP not reviewed this encounter.   Hughie Closs, [place] 05/11/20 1151

## 2020-05-11 NOTE — ED Triage Notes (Addendum)
Patient presents to [facility] for mass to top of right foot, which she noted Friday.  She states it was bigger, had shrunk over the weekend, and appears to be getting bigger again.  Pain with walking and at rest.  Denies specific injury, but states in the past 2 months she has had 2 falls from "my knee giving out"

## 2020-05-14 ENCOUNTER — Telehealth: Payer: Self-pay | Admitting: Skilled Nursing Facility1

## 2020-05-14 NOTE — Telephone Encounter (Signed)
RD called pt to verify fluid intake once starting soft, solid proteins 2 week post-bariatric surgery.   Daily Fluid intake: 64 oz Daily Protein intake: 60  Concerns/issues:   Pt states chicken, egg, pork rib did not go well stating they were greasy.

## 2020-05-25 DIAGNOSIS — M19079 Primary osteoarthritis, unspecified ankle and foot: Secondary | ICD-10-CM | POA: Insufficient documentation

## 2020-05-29 ENCOUNTER — Telehealth: Payer: Self-pay | Admitting: Skilled Nursing Facility1

## 2020-05-29 NOTE — Telephone Encounter (Signed)
Returned pts call.   Pt states she went to [facility] stating they could not sell her the vitamins as their machines were down.  Dietitian advised pt it would be okay to not have her vitamins for a couple days until she can get back to the pharmacy.

## 2020-05-31 ENCOUNTER — Other Ambulatory Visit: Payer: Self-pay | Admitting: Internal Medicine

## 2020-06-05 ENCOUNTER — Other Ambulatory Visit: Payer: Self-pay | Admitting: Internal Medicine

## 2020-06-05 DIAGNOSIS — E2839 Other primary ovarian failure: Secondary | ICD-10-CM

## 2020-06-16 ENCOUNTER — Ambulatory Visit
Admission: RE | Admit: 2020-06-16 | Discharge: 2020-06-16 | Disposition: A | Discharge status: Home / Self Care | Payer: Medicare HMO | Source: Ambulatory Visit | Attending: Internal Medicine | Admitting: Internal Medicine

## 2020-06-16 ENCOUNTER — Other Ambulatory Visit: Payer: Self-pay

## 2020-06-16 DIAGNOSIS — E2839 Other primary ovarian failure: Secondary | ICD-10-CM

## 2020-06-19 ENCOUNTER — Encounter: Payer: Medicare HMO | Attending: Surgery | Admitting: Skilled Nursing Facility1

## 2020-06-19 ENCOUNTER — Other Ambulatory Visit: Payer: Self-pay

## 2020-06-19 NOTE — Progress Notes (Signed)
Bariatric Nutrition Follow-Up Visit Medical Nutrition Therapy   NUTRITION ASSESSMENT    Anthropometrics  Surgery date: 04/24/2020 Surgery type: sleeve Start weight at [facility]: 411.3 Weight today: 336.2   Body Composition Scale 05/08/2020 06/19/2020  Current Body Weight 356.7 336.2  Total Body Fat % 52.6 51.6  Visceral Fat 28 26  Fat-Free Mass % 47.3 48.3   Total Body Water % 38.1 38.6  Muscle-Mass lbs 32.5 32.3  BMI 57.6 54.3  Body Fat Displacement           Torso  lbs 116.5 107.8         Left Leg  lbs 23.3 21.5         Right Leg  lbs 23.3 21.5         Left Arm  lbs 11.6 10.7         Right Arm   lbs 11.6 10.7    Clinical  Medical hx: no longer taking blood pressure medication  Medications: see list Labs:    Lifestyle & Dietary Hx  Pt states she thinks this has been pretty easy and loves BELT. Pt state she needs to get her weight down to 310 pounds to have her cataract surgery. Pt states she measures her proteins and uses small saucers to eat off.   Estimated daily fluid intake: 64 oz Estimated daily protein intake: 60+ g Supplements: multi and calcium Current average weekly physical activity: BELT  24-Hr Dietary Recall First Meal: protein shake + 2 sausage or canadian bacon Snack:  Second Meal: tuna salad  Snack:  Third Meal: ox tail + catfish (boiled) Snack:  Beverages: water + crystal water, decaf tea + lemon, plain water  Post-Op Goals/ Signs/ Symptoms Using straws: no Drinking while eating: no Chewing/swallowing difficulties: no Changes in vision: no Changes to mood/headaches: no Hair loss/changes to skin/nails: no Difficulty focusing/concentrating: no Sweating: no Dizziness/lightheadedness: no Palpitations: no  Carbonated/caffeinated beverages: no N/V/D/C/Gas: no Abdominal pain: no Dumping syndrome: no    NUTRITION DIAGNOSIS  Overweight/obesity ([place]-3.3) related to past poor dietary habits and physical inactivity as evidenced by completed  bariatric surgery and following dietary guidelines for continued weight loss and healthy nutrition status.     NUTRITION INTERVENTION Nutrition counseling (C-1) and education (E-2) to facilitate bariatric surgery goals, including: . Diet advancement to the next phase (phase 4) now including non starchy vegetables . The importance of consuming adequate calories as well as certain nutrients daily due to the body's need for essential vitamins, minerals, and fats . The importance of daily physical activity and to reach a goal of at least 150 minutes of moderate to vigorous physical activity weekly (or as directed by their physician) due to benefits such as increased musculature and improved lab values . The importance of intuitive eating specifically learning hunger-satiety cues and understanding the importance of learning a new body: The importance of mindful eating to avoid grazing behaviors   Goals: -Continue to aim for a minimum of 64 fluid ounces 7 days a week with at least 30 ounces being plain water -Eat non-starchy vegetables 2 times a day 7 days a week -Start out with soft cooked vegetables today and tomorrow; if tolerated begin to eat raw vegetables or cooked including salads -Eat your 3 ounces of protein first then start in on your non-starchy vegetables; once you understand how much of your meal leads to satisfaction and not full while still eating 3 ounces of protein and non-starchy vegetables you can eat them in any order  -  Continue to aim for 30 minutes of activity at least 5 times a week -Do NOT cook with/add to your food: alfredo sauce, cheese sauce, barbeque sauce, ketchup, fat back, butter, bacon grease, grease, Crisco, OR SUGAR   Handouts Provided Include   Phase 4  Learning Style & Readiness for Change Teaching method utilized: Visual & Auditory  Demonstrated degree of understanding via: Teach Back  Readiness Level: Action Barriers to learning/adherence to lifestyle  change: none identified  RD's Notes for Next Visit . Assess adherence to pt chosen goals   MONITORING & EVALUATION Dietary intake, weekly physical activity, body weight  Next Steps Patient is to follow-up in July

## 2020-07-12 ENCOUNTER — Encounter: Payer: Self-pay | Admitting: Obstetrics

## 2020-07-12 ENCOUNTER — Ambulatory Visit (INDEPENDENT_AMBULATORY_CARE_PROVIDER_SITE_OTHER): Payer: Medicare HMO | Admitting: Obstetrics

## 2020-07-12 ENCOUNTER — Other Ambulatory Visit (HOSPITAL_COMMUNITY)
Admission: RE | Admit: 2020-07-12 | Discharge: 2020-07-12 | Disposition: A | Discharge status: Home / Self Care | Payer: Medicare HMO | Source: Ambulatory Visit | Attending: Obstetrics | Admitting: Obstetrics

## 2020-07-12 ENCOUNTER — Other Ambulatory Visit: Payer: Self-pay

## 2020-07-12 VITALS — BP 144/65 | HR 79 | Ht 66.0 in | Wt 333.0 lb

## 2020-07-12 DIAGNOSIS — Z9884 Bariatric surgery status: Secondary | ICD-10-CM | POA: Diagnosis not present

## 2020-07-12 DIAGNOSIS — N898 Other specified noninflammatory disorders of vagina: Secondary | ICD-10-CM

## 2020-07-12 DIAGNOSIS — N950 Postmenopausal bleeding: Secondary | ICD-10-CM | POA: Diagnosis present

## 2020-07-12 DIAGNOSIS — Z6841 Body Mass Index (BMI) 40.0 and over, adult: Secondary | ICD-10-CM

## 2020-07-12 NOTE — Progress Notes (Signed)
Patient ID: Carla Sloan, female   DOB: 01/11/1961, 60 y.o.   MRN: [id number]  Chief Complaint  Patient presents with  . Vaginal Bleeding    HPI Carla Sloan is a 60 y.o. female.  Postmenopausal.  Onset of vaginal bleeding after 4 years of amenorrhea.  Also has vaginal discharge.  Denies pelvic pain.   HPI  Past Medical History:  Diagnosis Date  . Arthritis   . Depression   . Hypertension   . Muscle spasms of lower extremity    and back  . Sleep apnea     Past Surgical History:  Procedure Laterality Date  . COLONOSCOPY WITH PROPOFOL N/A 09/24/2012   Procedure: COLONOSCOPY WITH PROPOFOL;  Surgeon: Jeryl Columbia, MD;  Location: [facility] ENDOSCOPY;  Service: Endoscopy;  Laterality: N/A;  . COLONOSCOPY WITH PROPOFOL N/A 05/19/2016   Procedure: COLONOSCOPY WITH PROPOFOL;  Surgeon: Clarene Essex, MD;  Location: [facility] ENDOSCOPY;  Service: Endoscopy;  Laterality: N/A;  . LAPAROSCOPIC GASTRIC SLEEVE RESECTION N/A 04/24/2020   Procedure: LAPAROSCOPIC GASTRIC SLEEVE RESECTION;  Surgeon: Clovis Riley, MD;  Location: [facility] ORS;  Service: General;  Laterality: N/A;  . NO PAST SURGERIES    . TUBAL LIGATION    . UPPER GI ENDOSCOPY N/A 04/24/2020   Procedure: UPPER GI ENDOSCOPY;  Surgeon: Clovis Riley, MD;  Location: [facility] ORS;  Service: General;  Laterality: N/A;    Family History  Problem Relation Age of Onset  . Hypertension Mother   . Diabetes Mother   . Breast cancer Mother   . Diabetes Father   . Hypertension Father     Social History Social History   Tobacco Use  . Smoking status: Never Smoker  . Smokeless tobacco: Never Used  Vaping Use  . Vaping Use: Never used  Substance Use Topics  . Alcohol use: No  . Drug use: No    No Known Allergies  Current Outpatient Medications  Medication Sig Dispense Refill  . acetaminophen (TYLENOL) 500 MG tablet Take 1 tablet (500 mg total) by mouth every 6 (six) hours as needed. 30 tablet 0  . azelastine (OPTIVAR) 0.05 %  ophthalmic solution Place 1 drop into both eyes 2 (two) times daily.    . bimatoprost (LUMIGAN) 0.03 % ophthalmic solution Place 1 drop into both eyes at bedtime.    . brimonidine-timolol (COMBIGAN) 0.2-0.5 % ophthalmic solution Place 1 drop into both eyes every 12 (twelve) hours.    . CEQUA 0.09 % SOLN Place 1 drop into both eyes 2 (two) times daily.    . citalopram (CELEXA) 40 MG tablet Take 40 mg by mouth daily.    . ergocalciferol (VITAMIN D2) 1.25 MG (50000 UT) capsule Take 50,000 Units by mouth once a week.    . ferrous sulfate 324 MG TBEC Take 324 mg by mouth daily with breakfast.    . hydrOXYzine (ATARAX/VISTARIL) 25 MG tablet Take 1-2 tablets (25-50 mg total) by mouth at bedtime as needed. Take for insomnia. (Patient taking differently: Take 100 mg by mouth at bedtime. Take for insomnia.) 60 tablet 0  . Lurasidone HCl (LATUDA) 60 MG TABS Take 60 mg by mouth at bedtime.    . Multiple Vitamins-Minerals (CELEBRATE MULTI-COMPLETE 45) CHEW Chew 1 each by mouth daily.    . pravastatin (PRAVACHOL) 20 MG tablet Take 20 mg by mouth daily.    Marland Kitchen amLODipine (NORVASC) 10 MG tablet Take 0.5 tablets (5 mg total) by mouth daily. Monitor your blood pressure daily-you may need to  decrease or stop this medication fairly soon after bariatric surgery.  Report to your primary care doctor to titrate this medication. (Patient not taking: Reported on 07/12/2020) 30 tablet 0  . calcium citrate-vitamin D (CELEBRATE CALCIUM CITRATE) 500-500 MG-UNIT chewable tablet Chew 1 tablet by mouth every evening. (Patient not taking: Reported on 07/12/2020)    . cyclobenzaprine (FLEXERIL) 5 MG tablet Take 5 mg by mouth 3 (three) times daily. (Patient not taking: Reported on 07/12/2020)    . Elastic Bandages & Supports (WRIST SPLINT/COCK-UP/LEFT L) MISC Wear nightly and with exacerbating activities. (Patient not taking: Reported on 07/12/2020) 1 each 0  . Elastic Bandages & Supports (WRIST SPLINT/COCK-UP/RIGHT L) MISC Wear nightly and  with exacerbating activities. (Patient not taking: Reported on 07/12/2020) 1 each 0  . gabapentin (NEURONTIN) 800 MG tablet Take 800-1,600 mg by mouth 2 (two) times daily. Take 800 mg in the morning and 1600 mg at bedtime (Patient not taking: Reported on 07/12/2020)    . ondansetron (ZOFRAN-ODT) 4 MG disintegrating tablet Take 1 tablet (4 mg total) by mouth every 6 (six) hours as needed for nausea or vomiting. (Patient not taking: Reported on 07/12/2020) 20 tablet 0  . pantoprazole (PROTONIX) 40 MG tablet Take 1 tablet (40 mg total) by mouth daily. (Patient not taking: Reported on 07/12/2020) 90 tablet 0  . traMADol (ULTRAM) 50 MG tablet Take 1 tablet (50 mg total) by mouth every 6 (six) hours as needed (pain). (Patient not taking: Reported on 07/12/2020) 10 tablet 0   No current facility-administered medications for this visit.    Review of Systems Review of Systems Constitutional: negative for fatigue and weight loss Respiratory: negative for cough and wheezing Cardiovascular: negative for chest pain, fatigue and palpitations Gastrointestinal: negative for abdominal pain and change in bowel habits Genitourinary: positive for vaginal bleeding Integument/breast: negative for nipple discharge Musculoskeletal:negative for myalgias Neurological: negative for gait problems and tremors Behavioral/Psych: negative for abusive relationship, depression Endocrine: negative for temperature intolerance      Blood pressure (!) 144/65, pulse 79, height 5\' 6"  (1.676 m), weight (!) 333 lb (151 kg), last menstrual period 09/25/2015.  Physical Exam Physical Exam General:   alert and no distress  Skin:   no rash or abnormalities  Lungs:   clear to auscultation bilaterally  Heart:   regular rate and rhythm, S1, S2 normal, no murmur, click, rub or gallop  Breasts:   not examined  Abdomen:  normal findings: no organomegaly, soft, non-tender and no hernia  Pelvis:  External genitalia: normal general  appearance Urinary system: urethral meatus normal and bladder without fullness, nontender Vaginal: normal without tenderness, induration or masses Cervix: normal appearance Adnexa: normal bimanual exam Uterus: anteverted and non-tender, normal size    I have spent a total of 20 minutes of face-to-face time, excluding clinical staff time, reviewing notes and preparing to see patient, ordering tests and/or medications, and counseling the patient.  Data Reviewed Wet Prep  Assessment       1. Postmenopausal vaginal bleeding Rx: - Surgical pathology( [facility]/ [facility]) - US PELVIC COMPLETE WITH TRANSVAGINAL; Future  2. Vaginal discharge Rx: - Cervicovaginal ancillary only( )  3. Class 3 severe obesity due to excess calories without serious comorbidity with body mass index (BMI) of 50.0 to 59.9 in adult [facility]) - losing weight after gastric sleeve bypass surgery  4. Status post gastric bypass for obesity - doing well   Plan   Follow up in 4 weeks  Shelly Bombard, MD  07/12/2020 10:01 AM    Endometrial Biopsy Procedure Note  Pre-operative Diagnosis: Postmenopausal Bleeding  Post-operative Diagnosis: same  Indications: Postmenopausal Bleeding  Procedure Details   Urine pregnancy test was not done.  The risks (including infection, bleeding, pain, and uterine perforation) and benefits of the procedure were explained to the patient and Written informed consent was obtained.    The patient was placed in the dorsal lithotomy position.  Bimanual exam showed the uterus to be in the neutral position.  A Graves' speculum inserted in the vagina, and the cervix prepped with povidone iodine.  Endocervical curettage with a Kevorkian curette was not performed.   A sharp tenaculum was applied to the anterior lip of the cervix for stabilization.  A sterile uterine sound was used to sound the uterus to a depth of 7.5cm.  A Pipelle endometrial aspirator was used to sample the  endometrium.  Sample was sent for pathologic examination.  Condition: Stable  Complications: None  Plan:  The patient was advised to call for any fever or for prolonged or severe pain or bleeding. She was advised to use NSAID as needed for mild to moderate pain. She was advised to avoid vaginal intercourse for 48 hours or until the bleeding has completely stopped.  Attending Physician Documentation: I was present for or participated in the entire procedure, including opening and closing.   Shelly Bombard, MD 07/12/2020 10:40 AM

## 2020-07-12 NOTE — Progress Notes (Signed)
RGYN patient presents for problem visit today.   Last seen in office 08/2019.  Last pap: 09/20/19 WNL   CC: pt states period came on this past Monday after not having a cycle for 4 years. Bleeding is light today.

## 2020-07-13 LAB — CERVICOVAGINAL ANCILLARY ONLY
Bacterial Vaginitis (gardnerella): POSITIVE — AB
Candida Glabrata: NEGATIVE
Candida Vaginitis: NEGATIVE
Chlamydia: NEGATIVE
Comment: NEGATIVE
Comment: NEGATIVE
Comment: NEGATIVE
Comment: NEGATIVE
Comment: NEGATIVE
Comment: NORMAL
Neisseria Gonorrhea: NEGATIVE
Trichomonas: POSITIVE — AB

## 2020-07-13 LAB — SURGICAL PATHOLOGY

## 2020-07-14 ENCOUNTER — Other Ambulatory Visit: Payer: Self-pay | Admitting: Obstetrics

## 2020-07-14 DIAGNOSIS — B9689 Other specified bacterial agents as the cause of diseases classified elsewhere: Secondary | ICD-10-CM

## 2020-07-14 DIAGNOSIS — N760 Acute vaginitis: Secondary | ICD-10-CM

## 2020-07-14 DIAGNOSIS — A5901 Trichomonal vulvovaginitis: Secondary | ICD-10-CM

## 2020-07-14 MED ORDER — METRONIDAZOLE 500 MG PO TABS: 500.0000 mg | ORAL_TABLET | Freq: Two times a day (BID) | ORAL | 2 refills | Status: DC

## 2020-07-16 ENCOUNTER — Other Ambulatory Visit: Payer: Self-pay

## 2020-07-16 ENCOUNTER — Encounter: Payer: Self-pay | Admitting: Adult Health

## 2020-07-16 ENCOUNTER — Ambulatory Visit (INDEPENDENT_AMBULATORY_CARE_PROVIDER_SITE_OTHER): Payer: Medicare HMO | Admitting: Adult Health

## 2020-07-16 VITALS — BP 141/87 | HR 74 | Ht 66.0 in | Wt 331.0 lb

## 2020-07-16 DIAGNOSIS — G4733 Obstructive sleep apnea (adult) (pediatric): Secondary | ICD-10-CM

## 2020-07-16 DIAGNOSIS — Z903 Acquired absence of stomach [part of]: Secondary | ICD-10-CM

## 2020-07-16 NOTE — Progress Notes (Addendum)
[facility] [address] [place]. [place] [address] [phone]       OFFICE FOLLOW UP NOTE  [name] Date of Birth:  09-22-1960 Medical Record Number:  [id number]   Reason for visit: CPAP visit [place] provider: Dr. Rexene Alberts    SUBJECTIVE:   CHIEF COMPLAINT:  Chief Complaint  Patient presents with   Obstructive Sleep Apnea    RM14 alone Pt is well, she sleeps good without CPAP, she doesn't use it often.     HPI:   [name] is a 60 year old female with underlying medical history of HTN, glaucoma, depression, arthritis and morbid obesity who was initially evaluated by Dr. Rexene Alberts for possible sleep apnea.  She underwent split-night study on 04/07/2019 which showed overall mild OSA but near severe OSA in rem sleep with total AHI of 7/h, REM AHI of 29.7/h and O2 nadir of 67%.  Recommended treatment with CPAP for OSA management.  Today, 07/16/2020, [name] returns for CPAP compliance visit.  She was previously seen 1 year ago with 97% compliance but has not been using since at least over the past 6 months due to difficulty tolerating.  She feels as though she sleeps better without use of CPAP.  She underwent gastric sleeve surgery on 04/24/2020 with starting weight 411lbs and todays weight 331 lbs. She has been going to the gym daily and feeling good during the day with adequate energy levels.  No further concerns at this time      ROS:   14 system review of systems performed and negative with exception of no complaints     PMH:  Past Medical History:  Diagnosis Date   Arthritis    Depression    Hypertension    Muscle spasms of lower extremity    and back   Sleep apnea     PSH:  Past Surgical History:  Procedure Laterality Date   COLONOSCOPY WITH PROPOFOL N/A 09/24/2012   Procedure: COLONOSCOPY WITH PROPOFOL;  Surgeon: Jeryl Columbia, MD;  Location: [facility] ENDOSCOPY;  Service: Endoscopy;  Laterality: N/A;   COLONOSCOPY WITH  PROPOFOL N/A 05/19/2016   Procedure: COLONOSCOPY WITH PROPOFOL;  Surgeon: Clarene Essex, MD;  Location: [facility] ENDOSCOPY;  Service: Endoscopy;  Laterality: N/A;   LAPAROSCOPIC GASTRIC SLEEVE RESECTION N/A 04/24/2020   Procedure: LAPAROSCOPIC GASTRIC SLEEVE RESECTION;  Surgeon: Clovis Riley, MD;  Location: [facility] ORS;  Service: General;  Laterality: N/A;   NO PAST SURGERIES     TUBAL LIGATION     UPPER GI ENDOSCOPY N/A 04/24/2020   Procedure: UPPER GI ENDOSCOPY;  Surgeon: Clovis Riley, MD;  Location: [facility] ORS;  Service: General;  Laterality: N/A;    Social History:  Social History   Socioeconomic History   Marital status: Single    Spouse name: Not on file   Number of children: Not on file   Years of education: Not on file   Highest education level: Not on file  Occupational History   Not on file  Tobacco Use   Smoking status: Never Smoker   Smokeless tobacco: Never Used  Vaping Use   Vaping Use: Never used  Substance and Sexual Activity   Alcohol use: No   Drug use: No   Sexual activity: Yes    Partners: Male    Birth control/protection: None  Other Topics Concern   Not on file  Social History Narrative   Not on file   Social Determinants of Radio broadcast assistant  Strain: Not on file  Food Insecurity: Not on file  Transportation Needs: Not on file  Physical Activity: Not on file  Stress: Not on file  Social Connections: Not on file  Intimate Partner Violence: Not on file    Family History:  Family History  Problem Relation Age of Onset   Hypertension Mother    Diabetes Mother    Breast cancer Mother    Diabetes Father    Hypertension Father     Medications:   Current Outpatient Medications on File Prior to Visit  Medication Sig Dispense Refill   acetaminophen (TYLENOL) 500 MG tablet Take 1 tablet (500 mg total) by mouth every 6 (six) hours as needed. 30 tablet 0   azelastine (OPTIVAR) 0.05 % ophthalmic solution Place 1 drop into both eyes 2 (two) times  daily.     bimatoprost (LUMIGAN) 0.03 % ophthalmic solution Place 1 drop into both eyes at bedtime.     brimonidine-timolol (COMBIGAN) 0.2-0.5 % ophthalmic solution Place 1 drop into both eyes every 12 (twelve) hours.     calcium citrate-vitamin D (CELEBRATE CALCIUM CITRATE) 500-500 MG-UNIT chewable tablet Chew 1 tablet by mouth every evening.     CEQUA 0.09 % SOLN Place 1 drop into both eyes 2 (two) times daily.     citalopram (CELEXA) 40 MG tablet Take 40 mg by mouth daily.     cyclobenzaprine (FLEXERIL) 5 MG tablet Take 5 mg by mouth 3 (three) times daily.     Elastic Bandages & Supports (WRIST SPLINT/COCK-UP/LEFT L) MISC Wear nightly and with exacerbating activities. 1 each 0   Elastic Bandages & Supports (WRIST SPLINT/COCK-UP/RIGHT L) MISC Wear nightly and with exacerbating activities. 1 each 0   ergocalciferol (VITAMIN D2) 1.25 MG (50000 UT) capsule Take 50,000 Units by mouth once a week.     ferrous sulfate 324 MG TBEC Take 324 mg by mouth daily with breakfast.     gabapentin (NEURONTIN) 800 MG tablet Take 800-1,600 mg by mouth 2 (two) times daily. Take 800 mg in the morning and 1600 mg at bedtime     hydrOXYzine (ATARAX/VISTARIL) 25 MG tablet Take 1-2 tablets (25-50 mg total) by mouth at bedtime as needed. Take for insomnia. (Patient taking differently: Take 100 mg by mouth at bedtime. Take for insomnia.) 60 tablet 0   Lurasidone HCl (LATUDA) 60 MG TABS Take 60 mg by mouth at bedtime.     metroNIDAZOLE (FLAGYL) 500 MG tablet Take 1 tablet (500 mg total) by mouth 2 (two) times daily. 14 tablet 2   Multiple Vitamins-Minerals (CELEBRATE MULTI-COMPLETE 45) CHEW Chew 1 each by mouth daily.     ondansetron (ZOFRAN-ODT) 4 MG disintegrating tablet Take 1 tablet (4 mg total) by mouth every 6 (six) hours as needed for nausea or vomiting. 20 tablet 0   pantoprazole (PROTONIX) 40 MG tablet Take 1 tablet (40 mg total) by mouth daily. 90 tablet 0   pravastatin (PRAVACHOL) 20 MG tablet Take 20 mg by mouth  daily.     traMADol (ULTRAM) 50 MG tablet Take 1 tablet (50 mg total) by mouth every 6 (six) hours as needed (pain). 10 tablet 0   No current facility-administered medications on file prior to visit.    Allergies:  No Known Allergies    OBJECTIVE:  Physical Exam  Vitals:   07/16/20 1313  BP: (!) 141/87  Pulse: 74  Weight: (!) 331 lb (150.1 kg)  Height: 5\' 6"  (1.676 m)   Body mass index is 53.42 kg/m.  No exam data present  General: Morbidly obese pleasant middle-aged African-American female, seated, in no evident distress Head: head normocephalic and atraumatic.   Neck: supple with no carotid or supraclavicular bruits; neck circumference 14.25" (prior 15") Cardiovascular: regular rate and rhythm, no murmurs Musculoskeletal: no deformity Skin:  no rash/petichiae Vascular:  Normal pulses all extremities   Neurologic Exam Mental Status: Awake and fully alert. Oriented to place and time. Recent and remote memory intact. Attention span, concentration and fund of knowledge appropriate. Mood and affect appropriate.  Cranial Nerves: Pupils equal, briskly reactive to light. Extraocular movements full without nystagmus. Visual fields full to confrontation. Hearing intact. Facial sensation intact. Face, tongue, palate moves normally and symmetrically.  Motor: Normal bulk and tone. Normal strength in all tested extremity muscles. Sensory.: intact to touch , pinprick , position and vibratory sensation.  Coordination: Rapid alternating movements normal in all extremities. Finger-to-nose and heel-to-shin performed accurately bilaterally. Gait and Station: Arises from chair without difficulty. Stance is normal. Gait demonstrates normal stride length and balance without use of assistive device Reflexes: 1+ and symmetric. Toes downgoing.       ASSESSMENT: Damika Harmon is a 60 y.o. year old female with underlying medical history of HTN, glaucoma, depression, arthritis, morbid  obesity with BMI > 60 and recent diagnosis of obstructive sleep apnea on 04/07/2019 with initiation of CPAP.    PLAN:  Has not used over the past 6 months due to difficulty tolerating and she is hopeful with continued weight loss that she will no longer require treatment for apnea.  Underwent gastric sleeve 3/1 with already an 80lb weight loss!!  She was advised to contact office in the future if she wishes to restart but remains resistant to restart at this time.  No changes at this time.  No indication for routine follow-up unless she wishes to restart CPAP therapy.   CC:  [facility] provider: Dr. Kathlen Mody, MD    Frann Rider, [facility], [facility]  [facility] [address] [place], [place] [address]  Phone [phone] Fax [phone] Note: This document was prepared with digital dictation and possible smart phrase technology. Any transcriptional errors that result from this process are unintentional.   I reviewed the above note and documentation by the Nurse Practitioner and agree with the history, exam, assessment and plan as outlined above. I was available for consultation. Star Age, MD, PhD [facility])

## 2020-07-17 ENCOUNTER — Ambulatory Visit
Admission: RE | Admit: 2020-07-17 | Discharge: 2020-07-17 | Disposition: A | Discharge status: Home / Self Care | Payer: Medicare HMO | Source: Ambulatory Visit | Attending: Internal Medicine | Admitting: Internal Medicine

## 2020-07-17 ENCOUNTER — Other Ambulatory Visit: Payer: Self-pay

## 2020-07-17 DIAGNOSIS — Z1231 Encounter for screening mammogram for malignant neoplasm of breast: Secondary | ICD-10-CM

## 2020-07-18 ENCOUNTER — Other Ambulatory Visit: Payer: Self-pay

## 2020-07-18 ENCOUNTER — Ambulatory Visit (HOSPITAL_BASED_OUTPATIENT_CLINIC_OR_DEPARTMENT_OTHER)
Admission: RE | Admit: 2020-07-18 | Discharge: 2020-07-18 | Disposition: A | Discharge status: Home / Self Care | Payer: Medicare HMO | Source: Ambulatory Visit | Attending: Obstetrics | Admitting: Obstetrics

## 2020-07-18 DIAGNOSIS — N950 Postmenopausal bleeding: Secondary | ICD-10-CM | POA: Insufficient documentation

## 2020-07-26 ENCOUNTER — Telehealth: Payer: Self-pay | Admitting: *Deleted

## 2020-07-26 NOTE — Telephone Encounter (Signed)
Pt called to office and had questions about Flagy Rx. Spoke with pt regarding how to take, 7 day dose. All questions answered.   Pt states she has AEX in July and can have TOC at that appt.

## 2020-08-02 ENCOUNTER — Ambulatory Visit (HOSPITAL_COMMUNITY)
Admission: EM | Admit: 2020-08-02 | Discharge: 2020-08-02 | Disposition: A | Discharge status: Home / Self Care | Payer: Medicare HMO | Attending: Student | Admitting: Student

## 2020-08-02 ENCOUNTER — Other Ambulatory Visit: Payer: Self-pay

## 2020-08-02 ENCOUNTER — Encounter: Payer: Self-pay | Admitting: Obstetrics

## 2020-08-02 ENCOUNTER — Telehealth (INDEPENDENT_AMBULATORY_CARE_PROVIDER_SITE_OTHER): Payer: Medicare HMO | Admitting: Obstetrics

## 2020-08-02 ENCOUNTER — Ambulatory Visit (INDEPENDENT_AMBULATORY_CARE_PROVIDER_SITE_OTHER): Payer: Medicare HMO

## 2020-08-02 ENCOUNTER — Encounter (HOSPITAL_COMMUNITY): Payer: Self-pay

## 2020-08-02 DIAGNOSIS — M25562 Pain in left knee: Secondary | ICD-10-CM

## 2020-08-02 DIAGNOSIS — N950 Postmenopausal bleeding: Secondary | ICD-10-CM

## 2020-08-02 DIAGNOSIS — Z789 Other specified health status: Secondary | ICD-10-CM

## 2020-08-02 DIAGNOSIS — M1712 Unilateral primary osteoarthritis, left knee: Secondary | ICD-10-CM

## 2020-08-02 MED ORDER — DICLOFENAC SODIUM 1 % EX GEL: 4.0000 g | Freq: Four times a day (QID) | CUTANEOUS | 1 refills | Status: DC

## 2020-08-02 NOTE — Progress Notes (Signed)
GYNECOLOGY VIRTUAL VISIT ENCOUNTER NOTE  Provider location: Center for [place] [facility]   Patient location: Home  I connected with Carla Sloan on 08/02/20 at  8:45 AM EDT by MyChart Video Encounter and verified that I am speaking with the correct person using two identifiers.   I discussed the limitations, risks, security and privacy concerns of performing an evaluation and management service virtually and the availability of in person appointments. I also discussed with the patient that there may be a patient responsible charge related to this service. The patient expressed understanding and agreed to proceed.   History:  Carla Sloan is a 60 y.o. [phone] female being evaluated today for results of ultrasound and endometrial biopsy for postmenopausal bleeding. She denies any abnormal vaginal discharge, bleeding, pelvic pain or other concerns.       Past Medical History:  Diagnosis Date   Arthritis    Depression    Hypertension    Muscle spasms of lower extremity    and back   Sleep apnea    Past Surgical History:  Procedure Laterality Date   COLONOSCOPY WITH PROPOFOL N/A 09/24/2012   Procedure: COLONOSCOPY WITH PROPOFOL;  Surgeon: Jeryl Columbia, MD;  Location: [facility] ENDOSCOPY;  Service: Endoscopy;  Laterality: N/A;   COLONOSCOPY WITH PROPOFOL N/A 05/19/2016   Procedure: COLONOSCOPY WITH PROPOFOL;  Surgeon: Clarene Essex, MD;  Location: [facility] ENDOSCOPY;  Service: Endoscopy;  Laterality: N/A;   LAPAROSCOPIC GASTRIC SLEEVE RESECTION N/A 04/24/2020   Procedure: LAPAROSCOPIC GASTRIC SLEEVE RESECTION;  Surgeon: Clovis Riley, MD;  Location: [facility] ORS;  Service: General;  Laterality: N/A;   NO PAST SURGERIES     TUBAL LIGATION     UPPER GI ENDOSCOPY N/A 04/24/2020   Procedure: UPPER GI ENDOSCOPY;  Surgeon: Clovis Riley, MD;  Location: [facility] ORS;  Service: General;  Laterality: N/A;   The following portions of the patient's history were reviewed and updated  as appropriate: allergies, current medications, past family history, past medical history, past social history, past surgical history and problem list.   Health Maintenance:  Normal pap and negative HRHPV on [id number].  Normal mammogram on 07-17-2020.   Review of Systems:  Pertinent items noted in HPI and remainder of comprehensive ROS otherwise negative.  Physical Exam:   General:  Alert, oriented and cooperative. Patient appears to be in no acute distress.  Mental Status: Normal mood and affect. Normal behavior. Normal judgment and thought content.   Respiratory: Normal respiratory effort, no problems with respiration noted  Rest of physical exam deferred due to type of encounter  Labs and Imaging No results found for this or any previous visit (from the past 336 hour(s)). MM 3D SCREEN BREAST BILATERAL  Result Date: 07/18/2020 CLINICAL DATA:  Screening. EXAM: DIGITAL SCREENING BILATERAL MAMMOGRAM WITH TOMOSYNTHESIS AND CAD TECHNIQUE: Bilateral screening digital craniocaudal and mediolateral oblique mammograms were obtained. Bilateral screening digital breast tomosynthesis was performed. The images were evaluated with computer-aided detection. COMPARISON:  Previous exam(s). ACR Breast Density Category b: There are scattered areas of fibroglandular density. FINDINGS: There are no findings suspicious for malignancy. The images were evaluated with computer-aided detection. IMPRESSION: No mammographic evidence of malignancy. A result letter of this screening mammogram will be mailed directly to the patient. RECOMMENDATION: Screening mammogram in one year. (Code:SM-B-01Y) BI-RADS CATEGORY  1: Negative. Electronically Signed   By: Lillia Mountain M.D.   On: 07/18/2020 15:50   US PELVIC COMPLETE WITH TRANSVAGINAL  Result Date: 07/18/2020 CLINICAL  DATA:  Postmenopausal bleeding, single episode lasting 5 days, G2P2. Postmenopausal 4 years EXAM: TRANSABDOMINAL AND TRANSVAGINAL ULTRASOUND OF PELVIS  TECHNIQUE: Both transabdominal and transvaginal ultrasound examinations of the pelvis were performed. Transabdominal technique was performed for global imaging of the pelvis including uterus, ovaries, adnexal regions, and pelvic cul-de-sac. It was necessary to proceed with endovaginal exam following the transabdominal exam to visualize the uterus, endometrium, and ovaries. COMPARISON:  None FINDINGS: Uterus Measurements: 6.9 x 4.5 x 5.1 cm = volume: 83 mL. Anteverted. Small masses consistent with leiomyomata, posterior subserosal 2.1 x 1.6 x 1.8 cm and anterior RIGHT intramural 10 x 8 x 11 mm. Endometrium Thickness: 5 mm.  No endometrial fluid or mass Right ovary Measurements: 2.4 x 1.0 x 1.0 cm = volume: 1.2 mL. Normal morphology without mass Left ovary Measurements: 2.0 x 1.2 x 1.8 cm = volume: 2.2 mL. Normal morphology without mass Other findings Trace free pelvic fluid.  No adnexal masses. IMPRESSION: Two small uterine leiomyomata as above. Normal appearing ovaries. 5 mm thick endometrial complex; in the setting of post-menopausal bleeding, endometrial sampling is indicated to exclude carcinoma. If results are benign, sonohysterogram should be considered for focal lesion work-up. (Ref: Radiological Reasoning: Algorithmic Workup of Abnormal Vaginal Bleeding with Endovaginal Sonography and Sonohysterography. AJR 2008; 638:V56-43) These results will be called to the ordering clinician or representative by the Radiologist Assistant, and communication documented in the PACS or [employer]. Electronically Signed   By: Lavonia Dana M.D.   On: 07/18/2020 11:58       Assessment and Plan:     1. Postmenopausal vaginal bleeding                                                                                                                                                            - no further bleeding.  Ultrasound and endometrial biopsy WNL's. -  will follow clinically   I discussed the assessment and treatment  plan with the patient. The patient was provided an opportunity to ask questions and all were answered. The patient agreed with the plan and demonstrated an understanding of the instructions.   The patient was advised to call back or seek an in-person evaluation/go to the ED if the symptoms worsen or if the condition fails to improve as anticipated.  I have spent a total of 15 minutes of face-to-face time, excluding clinical staff time, reviewing notes and preparing to see patient, ordering tests and/or medications, and counseling the patient.    Baltazar Najjar, MD [facility]  08/02/20

## 2020-08-02 NOTE — ED Provider Notes (Signed)
[place]    CSN: [id number] Arrival date & time: 08/02/20  4098      History   Chief Complaint Chief Complaint  Patient presents with   Knee Pain    HPI Carla Sloan is a 60 y.o. female presenting with left knee pain following fall that occurred 2 weeks ago. Medical history morbid obesity and severe bilateral knee OA.  States she turned too quickly and fell onto her left knee about 2 weeks ago.  Since then it has been painful and swollen, especially with ambulating and flexion.  Has been trying ice, elevation, Bengay with no relief.  Denies pain or injury elsewhere, denies dizziness before or after the fall.  She already follows for Ortho and gets cortisone injections every 3 months for her osteoarthritis.  She is awaiting bilateral knee replacement, but states she needs to lose more weight first.  She is working on this through healthy lifestyle changes.  HPI  Past Medical History:  Diagnosis Date   Arthritis    Depression    Hypertension    Muscle spasms of lower extremity    and back   Sleep apnea     Patient Active Problem List   Diagnosis Date Noted   Morbid obesity ([place]) 04/24/2020    Past Surgical History:  Procedure Laterality Date   COLONOSCOPY WITH PROPOFOL N/A 09/24/2012   Procedure: COLONOSCOPY WITH PROPOFOL;  Surgeon: Jeryl Columbia, MD;  Location: [facility] ENDOSCOPY;  Service: Endoscopy;  Laterality: N/A;   COLONOSCOPY WITH PROPOFOL N/A 05/19/2016   Procedure: COLONOSCOPY WITH PROPOFOL;  Surgeon: Clarene Essex, MD;  Location: [facility] ENDOSCOPY;  Service: Endoscopy;  Laterality: N/A;   LAPAROSCOPIC GASTRIC SLEEVE RESECTION N/A 04/24/2020   Procedure: LAPAROSCOPIC GASTRIC SLEEVE RESECTION;  Surgeon: Clovis Riley, MD;  Location: [facility] ORS;  Service: General;  Laterality: N/A;   NO PAST SURGERIES     TUBAL LIGATION     UPPER GI ENDOSCOPY N/A 04/24/2020   Procedure: UPPER GI ENDOSCOPY;  Surgeon: Clovis Riley, MD;  Location: [facility] ORS;  Service: General;   Laterality: N/A;    OB History     Gravida  2   Para  2   Term  2   Preterm      AB      Living  2      SAB      IAB      Ectopic      Multiple      Live Births  2            Home Medications    Prior to Admission medications   Medication Sig Start Date End Date Taking? Authorizing Provider  acetaminophen (TYLENOL) 500 MG tablet Take 1 tablet (500 mg total) by mouth every 6 (six) hours as needed. 05/11/20  Yes Covington, Sarah M, PA-C  azelastine (OPTIVAR) 0.05 % ophthalmic solution Place 1 drop into both eyes 2 (two) times daily.   Yes [provider]  bimatoprost (LUMIGAN) 0.03 % ophthalmic solution Place 1 drop into both eyes at bedtime.   Yes [provider]  brimonidine-timolol (COMBIGAN) 0.2-0.5 % ophthalmic solution Place 1 drop into both eyes every 12 (twelve) hours.   Yes [provider]  calcium citrate-vitamin D (CELEBRATE CALCIUM CITRATE) 500-500 MG-UNIT chewable tablet Chew 1 tablet by mouth every evening.   Yes [provider]  CEQUA 0.09 % SOLN Place 1 drop into both eyes 2 (two) times daily. 11/17/19  Yes [provider]  citalopram (CELEXA) 40 MG tablet Take 40 mg by mouth daily.   Yes [provider]  cyclobenzaprine (FLEXERIL) 5 MG tablet Take 5 mg by mouth 3 (three) times daily. 06/20/13  Yes [provider]  diclofenac Sodium (VOLTAREN) 1 % GEL Apply 4 g topically 4 (four) times daily. 08/02/20  Yes Hazel Sams, PA-C  ergocalciferol (VITAMIN D2) 1.25 MG (50000 UT) capsule Take 50,000 Units by mouth once a week.   Yes [provider]  ferrous sulfate 324 MG TBEC Take 324 mg by mouth daily with breakfast.   Yes [provider]  hydrOXYzine (ATARAX/VISTARIL) 25 MG tablet Take 1-2 tablets (25-50 mg total) by mouth at bedtime as needed. Take for insomnia. Patient taking differently: Take 100 mg by mouth at bedtime. Take for insomnia. 05/21/19  Yes Jaynee Eagles, PA-C   Lurasidone HCl (LATUDA) 60 MG TABS Take 60 mg by mouth at bedtime.   Yes [provider]  Multiple Vitamins-Minerals (CELEBRATE MULTI-COMPLETE 45) CHEW Chew 1 each by mouth daily.   Yes [provider]  ondansetron (ZOFRAN-ODT) 4 MG disintegrating tablet Take 1 tablet (4 mg total) by mouth every 6 (six) hours as needed for nausea or vomiting. 04/25/20  Yes Clovis Riley, MD  pantoprazole (PROTONIX) 40 MG tablet Take 1 tablet (40 mg total) by mouth daily. 04/25/20  Yes Clovis Riley, MD  pravastatin (PRAVACHOL) 20 MG tablet Take 20 mg by mouth daily. 12/02/19  Yes [provider]  Elastic Bandages & Supports (WRIST SPLINT/COCK-UP/LEFT L) MISC Wear nightly and with exacerbating activities. 10/24/19   Patriciaann Clan, DO  Elastic Bandages & Supports (WRIST SPLINT/COCK-UP/RIGHT L) MISC Wear nightly and with exacerbating activities. 10/24/19   Patriciaann Clan, DO  gabapentin (NEURONTIN) 800 MG tablet Take 800-1,600 mg by mouth 2 (two) times daily. Take 800 mg in the morning and 1600 mg at bedtime    [provider]  traMADol (ULTRAM) 50 MG tablet Take 1 tablet (50 mg total) by mouth every 6 (six) hours as needed (pain). 04/25/20   Clovis Riley, MD    Family History Family History  Problem Relation Age of Onset   Hypertension Mother    Diabetes Mother    Breast cancer Mother    Diabetes Father    Hypertension Father     Social History Social History   Tobacco Use   Smoking status: Never   Smokeless tobacco: Never  Vaping Use   Vaping Use: Never used  Substance Use Topics   Alcohol use: No   Drug use: No     Allergies   Patient has no known allergies.   Review of Systems Review of Systems  Musculoskeletal:        L knee pain  All other systems reviewed and are negative.   Physical Exam Triage Vital Signs ED Triage Vitals  Enc Vitals Group     BP      Pulse      Resp      Temp      Temp src      SpO2      Weight       Height      Head Circumference      Peak Flow      Pain Score      Pain Loc      Pain Edu?      Excl. in [place]?    No data found.  Updated Vital  Signs BP 132/85   Pulse 81   Temp 97.9 F (36.6 C)   Resp 18   LMP 09/25/2015 (Approximate)   SpO2 95%   Visual Acuity Right Eye Distance:   Left Eye Distance:   Bilateral Distance:    Right Eye Near:   Left Eye Near:    Bilateral Near:     Physical Exam Vitals reviewed.  Constitutional:      General: She is not in acute distress.    Appearance: Normal appearance. She is not ill-appearing or diaphoretic.  HENT:     Head: Normocephalic and atraumatic.  Cardiovascular:     Rate and Rhythm: Normal rate and regular rhythm.     Heart sounds: Normal heart sounds.  Pulmonary:     Effort: Pulmonary effort is normal.     Breath sounds: Normal breath sounds.  Musculoskeletal:     Comments: Exam limited due to body habitus. Pain with palpation over L patellar tendon insertion. No obvious bony deformity or effusion. ROM intact but pain with flexion. No joint laxity.   No other tenderness, injury, deformity.   Skin:    General: Skin is warm.  Neurological:     General: No focal deficit present.     Mental Status: She is alert and oriented to person, place, and time.  Psychiatric:        Mood and Affect: Mood normal.        Behavior: Behavior normal.        Thought Content: Thought content normal.        Judgment: Judgment normal.     UC Treatments / Results  Labs (all labs ordered are listed, but only abnormal results are displayed) Labs Reviewed - No data to display  EKG   Radiology DG Knee 2 Views Left  Result Date: 08/02/2020 CLINICAL DATA:  Pain EXAM: LEFT KNEE - 1-2 VIEW COMPARISON:  None. FINDINGS: Frontal and lateral views were obtained. No fracture, dislocation or joint effusion. There is moderate generalized joint space narrowing with spurring in all compartments. There is mild calcification in the suprapatellar  bursa. No erosions. IMPRESSION: Moderate generalized osteoarthritic change. 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 No fracture, dislocation, or joint effusion evident. Electronically Signed   By: Lowella Grip III M.D.   On: 08/02/2020 11:12    Procedures Procedures (including critical care time)  Medications Ordered in UC Medications - No data to display  Initial Impression / Assessment and Plan / UC Course  I have reviewed the triage vital signs and the nursing notes.  Pertinent labs & imaging results that were available during my care of the patient were reviewed by me and considered in my medical decision making (see chart for details).     This patient is a 59 year old female presenting with left patellar contusion following fall that occurred 2 weeks ago.  Long history of bilateral knee OA.  Neurovascularly intact.  X-ray left knee- no acute bony abnormality.  Trial of voltaren gel as below. Wrapped in ace bandage for support.  This patient is already followed by Ortho for her chronic knee arthritis and is awaiting knee replacement when she loses the rest of the weight.  Praised her healthy weight loss and lifestyle changes so far and encouraged her to continue these.  Follow-up with Ortho as scheduled in 1 month for injection, or sooner if symptoms worsen or persist..  Final Clinical Impressions(s) / UC Diagnoses   Final diagnoses:  Primary osteoarthritis of left knee  Morbid obesity (  [place])  History of recent change in lifestyle     Discharge Instructions      -Try the Voltaren gel, up to 4 times daily applied to the left knee for pain. -Use the Ace wrap for support throughout the day. -Try to avoid prolonged standing and walking while you are having this knee pain. -Also try ice, elevating the leg at the end of the day. -Follow-up with your orthopedist (the provider that does your knee injections) if your symptoms persist     ED Prescriptions      Medication Sig Dispense Auth. Provider   diclofenac Sodium (VOLTAREN) 1 % GEL Apply 4 g topically 4 (four) times daily. 100 g Hazel Sams, PA-C      PDMP not reviewed this encounter.   Hazel Sams, PA-C 08/02/20 1139

## 2020-08-02 NOTE — ED Triage Notes (Signed)
Pt c/o left knee pain for about 2 weeks. Has tried ice and Bengay with no relief. States is supposed to get bilateral knee replacements.

## 2020-08-02 NOTE — Discharge Instructions (Addendum)
-  Try the Voltaren gel, up to 4 times daily applied to the left knee for pain. -Use the Ace wrap for support throughout the day. -Try to avoid prolonged standing and walking while you are having this knee pain. -Also try ice, elevating the leg at the end of the day. -Follow-up with your orthopedist (the provider that does your knee injections) if your symptoms persist

## 2020-09-04 ENCOUNTER — Other Ambulatory Visit: Payer: Self-pay

## 2020-09-04 ENCOUNTER — Encounter: Payer: Medicare HMO | Attending: Surgery | Admitting: Skilled Nursing Facility1

## 2020-09-04 DIAGNOSIS — Z713 Dietary counseling and surveillance: Secondary | ICD-10-CM | POA: Diagnosis not present

## 2020-09-04 DIAGNOSIS — Z09 Encounter for follow-up examination after completed treatment for conditions other than malignant neoplasm: Secondary | ICD-10-CM | POA: Diagnosis not present

## 2020-09-04 DIAGNOSIS — Z6841 Body Mass Index (BMI) 40.0 and over, adult: Secondary | ICD-10-CM | POA: Diagnosis not present

## 2020-09-04 DIAGNOSIS — Z9884 Bariatric surgery status: Secondary | ICD-10-CM | POA: Diagnosis not present

## 2020-09-06 NOTE — Progress Notes (Signed)
Follow-up visit:  Post-Operative sleeve Surgery  Medical Nutrition Therapy:  Appt start time: 6:00pm end time:  7:00pm  Primary concerns today: Post-operative Bariatric Surgery Nutrition Management 6 Month Post-Op Class  Anthropometrics  Surgery date: 04/24/2020 Surgery type: sleeve Start weight at [facility]: 411.3 Weight today: 304.7 pounds   Body Composition Scale 05/08/2020 06/19/2020  Current Body Weight 356.7 336.2  Total Body Fat % 52.6 51.6  Visceral Fat 28 26  Fat-Free Mass % 47.3 48.3   Total Body Water % 38.1 38.6  Muscle-Mass lbs 32.5 32.3  BMI 57.6 54.3  Body Fat Displacement           Torso  lbs 116.5 107.8         Left Leg  lbs 23.3 21.5         Right Leg  lbs 23.3 21.5         Left Arm  lbs 11.6 10.7         Right Arm   lbs 11.6 10.7      Information Reviewed/ Discussed During Appointment: -Review of composition scale numbers -Fluid requirements (64-100 ounces) -Protein requirements (60-80g) -Strategies for tolerating diet -Advancement of diet to include Starchy vegetables -Barriers to inclusion of new foods -Inclusion of appropriate multivitamin and calcium supplements  -Exercise recommendations   Fluid intake: adequate   Medications: See List Supplementation: appropriate    Using straws: no Drinking while eating: no Having you been chewing well: yes Chewing/swallowing difficulties: no Changes in vision: no Changes to mood/headaches: no Hair loss/Cahnges to skin/Changes to nails: no Any difficulty focusing or concentrating: no Sweating: no Dizziness/Lightheaded: no Palpitations: no  Carbonated beverages: no N/V/D/C/GAS: no Abdominal Pain: no Dumping syndrome: no  Recent physical activity:  ADL's  Progress Towards Goal(s):  In Progress Teaching method utilized: Visual & Auditory  Demonstrated degree of understanding via: Teach Back  Readiness Level: Action Barriers to learning/adherence to lifestyle change: none identified  Handouts  given during visit include: Phase V diet Progression  Goals Sheet The Benefits of Exercise are endless..... Support Group Topics   Teaching Method Utilized:  Visual Auditory Hands on  Demonstrated degree of understanding via:  Teach Back   Monitoring/Evaluation:  Dietary intake, exercise, and body weight. Follow up in 3 months for 9 month post-op visit.

## 2020-09-20 ENCOUNTER — Ambulatory Visit (INDEPENDENT_AMBULATORY_CARE_PROVIDER_SITE_OTHER): Payer: Medicare HMO | Admitting: Obstetrics

## 2020-09-20 ENCOUNTER — Other Ambulatory Visit (HOSPITAL_COMMUNITY)
Admission: RE | Admit: 2020-09-20 | Discharge: 2020-09-20 | Disposition: A | Discharge status: Home / Self Care | Payer: Medicare HMO | Source: Ambulatory Visit | Attending: Obstetrics | Admitting: Obstetrics

## 2020-09-20 ENCOUNTER — Encounter: Payer: Self-pay | Admitting: Obstetrics

## 2020-09-20 ENCOUNTER — Other Ambulatory Visit: Payer: Self-pay

## 2020-09-20 VITALS — BP 146/78 | HR 80 | Ht 66.0 in | Wt 302.0 lb

## 2020-09-20 DIAGNOSIS — Z01419 Encounter for gynecological examination (general) (routine) without abnormal findings: Secondary | ICD-10-CM | POA: Diagnosis present

## 2020-09-20 DIAGNOSIS — Z1151 Encounter for screening for human papillomavirus (HPV): Secondary | ICD-10-CM | POA: Diagnosis not present

## 2020-09-20 DIAGNOSIS — N898 Other specified noninflammatory disorders of vagina: Secondary | ICD-10-CM

## 2020-09-20 DIAGNOSIS — Z6841 Body Mass Index (BMI) 40.0 and over, adult: Secondary | ICD-10-CM

## 2020-09-20 DIAGNOSIS — Z113 Encounter for screening for infections with a predominantly sexual mode of transmission: Secondary | ICD-10-CM

## 2020-09-20 NOTE — Progress Notes (Signed)
Subjective:        Carla Sloan is a 60 y.o. female here for a routine exam.  Current complaints: Vaginal discharge.    Personal health questionnaire:  Is patient [name], have a family history of breast and/or ovarian cancer: no Is there a family history of uterine cancer diagnosed at age < 21, gastrointestinal cancer, urinary tract cancer, family member who is a [name]: no Is the patient overweight and hypertensive, family history of diabetes, personal history of gestational diabetes, preeclampsia or PCOS: yes Is patient over 21, have PCOS,  family history of premature CHD under age 42, diabetes, smoke, have hypertension or peripheral artery disease:  no At any time, has a partner hit, kicked or otherwise hurt or frightened you?: no Over the past 2 weeks, have you felt down, depressed or hopeless?: no Over the past 2 weeks, have you felt little interest or pleasure in doing things?:no   Gynecologic History Patient's last menstrual period was 09/25/2015 (approximate). Contraception: post menopausal status Last Pap: 09-20-2019. Results were: normal Last mammogram: 07-17-2020. Results were: normal  Obstetric History OB History  Gravida Para Term Preterm AB Living  '2 2 2     2  '$ SAB IAB Ectopic Multiple Live Births          2    # Outcome Date GA Lbr Len/2nd Weight Sex Delivery Anes PTL Lv  2 Term 12/09/93 [redacted]w[redacted]d 8 lb 6 oz (3.799 kg) M Vag-Spont EPI  LIV  1 Term 10/26/90 [id number] lb 6 oz (2.892 kg) F Vag-Spont EPI  LIV    Past Medical History:  Diagnosis Date   Arthritis    Depression    Hypertension    Muscle spasms of lower extremity    and back   Sleep apnea     Past Surgical History:  Procedure Laterality Date   COLONOSCOPY WITH PROPOFOL N/A 09/24/2012   Procedure: COLONOSCOPY WITH PROPOFOL;  Surgeon: MaJeryl ColumbiaMD;  Location: [facility];  Service: Endoscopy;  Laterality: N/A;   COLONOSCOPY WITH PROPOFOL N/A  05/19/2016   Procedure: COLONOSCOPY WITH PROPOFOL;  Surgeon: MaClarene EssexMD;  [facility];  Service: Endoscopy;  Laterality: N/A;   LAPAROSCOPIC GASTRIC SLEEVE RESECTION N/A 04/24/2020   Procedure: LAPAROSCOPIC GASTRIC SLEEVE RESECTION;  Surgeon: CoClovis RileyMD;  Location: [facility];  Service: General;  Laterality: N/A;   NO PAST SURGERIES     TUBAL LIGATION     UPPER GI ENDOSCOPY N/A 04/24/2020   Procedure: UPPER GI ENDOSCOPY;  Surgeon: CoClovis RileyMD;  Location: [facility];  Service: General;  Laterality: N/A;     Current Outpatient Medications:    acetaminophen (TYLENOL) 500 MG tablet, Take 1 tablet (500 mg total) by mouth every 6 (six) hours as needed., Disp: 30 tablet, Rfl: 0   azelastine (OPTIVAR) 0.05 % ophthalmic solution, Place 1 drop into both eyes 2 (two) times daily., Disp: , Rfl:    bimatoprost (LUMIGAN) 0.03 % ophthalmic solution, Place 1 drop into both eyes at bedtime., Disp: , Rfl:    brimonidine-timolol (COMBIGAN) 0.2-0.5 % ophthalmic solution, Place 1 drop into both eyes every 12 (twelve) hours., Disp: , Rfl:    calcium citrate-vitamin D (CELEBRATE CALCIUM CITRATE) 500-500 MG-UNIT chewable tablet, Chew 1 tablet by mouth every evening., Disp: , Rfl:    CEQUA 0.09 % SOLN, Place 1 drop into both eyes 2 (two) times daily., Disp: , Rfl:    citalopram (CELEXA)  40 MG tablet, Take 40 mg by mouth daily., Disp: , Rfl:    cyclobenzaprine (FLEXERIL) 5 MG tablet, Take 5 mg by mouth 3 (three) times daily., Disp: , Rfl:    diclofenac Sodium (VOLTAREN) 1 % GEL, Apply 4 g topically 4 (four) times daily., Disp: 100 g, Rfl: 1   Elastic Bandages & Supports (WRIST SPLINT/COCK-UP/LEFT L) MISC, Wear nightly and with exacerbating activities., Disp: 1 each, Rfl: 0   Elastic Bandages & Supports (WRIST SPLINT/COCK-UP/RIGHT L) MISC, Wear nightly and with exacerbating activities., Disp: 1 each, Rfl: 0   ergocalciferol (VITAMIN D2) 1.25 MG (50000 UT) capsule, Take 50,000 Units by mouth once a  week., Disp: , Rfl:    ferrous sulfate 324 MG TBEC, Take 324 mg by mouth daily with breakfast., Disp: , Rfl:    gabapentin (NEURONTIN) 800 MG tablet, Take 800-1,600 mg by mouth 2 (two) times daily. Take 800 mg in the morning and 1600 mg at bedtime, Disp: , Rfl:    hydrOXYzine (ATARAX/VISTARIL) 25 MG tablet, Take 1-2 tablets (25-50 mg total) by mouth at bedtime as needed. Take for insomnia. (Patient taking differently: Take 100 mg by mouth at bedtime. Take for insomnia.), Disp: 60 tablet, Rfl: 0   Lurasidone HCl (LATUDA) 60 MG TABS, Take 60 mg by mouth at bedtime., Disp: , Rfl:    Multiple Vitamins-Minerals (CELEBRATE MULTI-COMPLETE 45) CHEW, Chew 1 each by mouth daily., Disp: , Rfl:    ondansetron (ZOFRAN-ODT) 4 MG disintegrating tablet, Take 1 tablet (4 mg total) by mouth every 6 (six) hours as needed for nausea or vomiting., Disp: 20 tablet, Rfl: 0   pantoprazole (PROTONIX) 40 MG tablet, Take 1 tablet (40 mg total) by mouth daily., Disp: 90 tablet, Rfl: 0   pravastatin (PRAVACHOL) 20 MG tablet, Take 20 mg by mouth daily., Disp: , Rfl:    traMADol (ULTRAM) 50 MG tablet, Take 1 tablet (50 mg total) by mouth every 6 (six) hours as needed (pain)., Disp: 10 tablet, Rfl: 0 No Known Allergies  Social History   Tobacco Use   Smoking status: Never   Smokeless tobacco: Never  Substance Use Topics   Alcohol use: No    Family History  Problem Relation Age of Onset   Hypertension Mother    Diabetes Mother    Breast cancer Mother    Diabetes Father    Hypertension Father       Review of Systems  Constitutional: negative for fatigue and weight loss Respiratory: negative for cough and wheezing Cardiovascular: negative for chest pain, fatigue and palpitations Gastrointestinal: negative for abdominal pain and change in bowel habits Musculoskeletal:negative for myalgias Neurological: negative for gait problems and tremors Behavioral/Psych: negative for abusive relationship, depression Endocrine:  negative for temperature intolerance    Genitourinary: positive for vaginal discharge.  negative for abnormal menstrual periods, genital lesions, hot flashes, sexual problems  Integument/breast: negative for breast lump, breast tenderness, nipple discharge and skin lesion(s)    Objective:       BP (!) 146/78   Pulse 80   Ht '5\' 6"'$  (1.676 m)   Wt (!) 302 lb (137 kg)   LMP 09/25/2015 (Approximate)   BMI 48.74 kg/m  General:   Alert and no distress  Skin:   no rash or abnormalities  Lungs:   clear to auscultation bilaterally  Heart:   regular rate and rhythm, S1, S2 normal, no murmur, click, rub or gallop  Breasts:   normal without suspicious masses, skin or nipple changes or axillary  nodes  Abdomen:  normal findings: no organomegaly, soft, non-tender and no hernia  Pelvis:  External genitalia: normal general appearance Urinary system: urethral meatus normal and bladder without fullness, nontender Vaginal: normal without tenderness, induration or masses Cervix: normal appearance Adnexa: normal bimanual exam Uterus: anteverted and non-tender, normal size   Lab Review Urine pregnancy test Labs reviewed yes Radiologic studies reviewed yes  I have spent a total of 20 minutes of face-to-face time, excluding clinical staff time, reviewing notes and preparing to see patient, ordering tests and/or medications, and counseling the patient.   Assessment:    1. Encounter for gynecological examination with Papanicolaou smear of cervix Rx: - Cytology - PAP( [facility])  2. Vaginal discharge Rx: - Cervicovaginal ancillary only  3. Screening for STD (sexually transmitted disease) Rx: - Hepatitis B surface antigen - Hepatitis C antibody - HIV Antibody (routine testing w rflx) - RPR  4. Class 3 severe obesity without serious comorbidity with body mass index (BMI) of 45.0 to 49.9 in adult, unspecified obesity type [facility]) - recent Bariatric Surgery.  Has lost ~ 100 lbs     Plan:     Education reviewed: calcium supplements, depression evaluation, low fat, low cholesterol diet, safe sex/STD prevention, self breast exams, and weight bearing exercise. Follow up in: 1 year.   No orders of the defined types were placed in this encounter.  Orders Placed This Encounter  Procedures   Hepatitis B surface antigen   Hepatitis C antibody   HIV Antibody (routine testing w rflx)   RPR     Shelly Bombard, MD 09/20/2020 8:49 AM

## 2020-09-20 NOTE — Progress Notes (Signed)
Pt presents for annual, pap, and all STD testing. Normal Mammogram 07/18/20. Colonoscopy due next year per pt  Bone Density completed 06/16/20

## 2020-09-21 LAB — HIV ANTIBODY (ROUTINE TESTING W REFLEX): HIV Screen 4th Generation wRfx: NONREACTIVE

## 2020-09-21 LAB — CERVICOVAGINAL ANCILLARY ONLY
Bacterial Vaginitis (gardnerella): NEGATIVE
Candida Glabrata: NEGATIVE
Candida Vaginitis: NEGATIVE
Chlamydia: NEGATIVE
Comment: NEGATIVE
Comment: NEGATIVE
Comment: NEGATIVE
Comment: NEGATIVE
Comment: NEGATIVE
Comment: NORMAL
Neisseria Gonorrhea: NEGATIVE
Trichomonas: NEGATIVE

## 2020-09-21 LAB — HEPATITIS B SURFACE ANTIGEN: Hepatitis B Surface Ag: NEGATIVE

## 2020-09-21 LAB — HEPATITIS C ANTIBODY: Hep C Virus Ab: 0.1 s/co ratio (ref 0.0–0.9)

## 2020-09-21 LAB — RPR: RPR Ser Ql: NONREACTIVE

## 2020-09-24 LAB — CYTOLOGY - PAP
Comment: NEGATIVE
Diagnosis: NEGATIVE
High risk HPV: NEGATIVE

## 2020-11-28 ENCOUNTER — Emergency Department (HOSPITAL_COMMUNITY)
Admission: EM | Admit: 2020-11-28 | Discharge: 2020-11-29 | Disposition: A | Discharge status: Home / Self Care | Payer: Medicare HMO | Attending: Emergency Medicine | Admitting: Emergency Medicine

## 2020-11-28 ENCOUNTER — Encounter (HOSPITAL_COMMUNITY): Payer: Self-pay

## 2020-11-28 ENCOUNTER — Other Ambulatory Visit: Payer: Self-pay

## 2020-11-28 DIAGNOSIS — R109 Unspecified abdominal pain: Secondary | ICD-10-CM | POA: Diagnosis present

## 2020-11-28 DIAGNOSIS — I10 Essential (primary) hypertension: Secondary | ICD-10-CM | POA: Insufficient documentation

## 2020-11-28 DIAGNOSIS — Z79899 Other long term (current) drug therapy: Secondary | ICD-10-CM | POA: Insufficient documentation

## 2020-11-28 DIAGNOSIS — R1084 Generalized abdominal pain: Secondary | ICD-10-CM | POA: Diagnosis not present

## 2020-11-28 LAB — CBC WITH DIFFERENTIAL/PLATELET
Abs Immature Granulocytes: 0.03 10*3/uL (ref 0.00–0.07)
Basophils Absolute: 0 10*3/uL (ref 0.0–0.1)
Basophils Relative: 1 %
Eosinophils Absolute: 0.2 10*3/uL (ref 0.0–0.5)
Eosinophils Relative: 2 %
HCT: 38.1 % (ref 36.0–46.0)
Hemoglobin: 12 g/dL (ref 12.0–15.0)
Immature Granulocytes: 0 %
Lymphocytes Relative: 39 %
Lymphs Abs: 3.4 10*3/uL (ref 0.7–4.0)
MCH: 29 pg (ref 26.0–34.0)
MCHC: 31.5 g/dL (ref 30.0–36.0)
MCV: 92 fL (ref 80.0–100.0)
Monocytes Absolute: 0.7 10*3/uL (ref 0.1–1.0)
Monocytes Relative: 8 %
Neutro Abs: 4.5 10*3/uL (ref 1.7–7.7)
Neutrophils Relative %: 50 %
Platelets: 272 10*3/uL (ref 150–400)
RBC: 4.14 MIL/uL (ref 3.87–5.11)
RDW: 15.5 % (ref 11.5–15.5)
WBC: 8.8 10*3/uL (ref 4.0–10.5)
nRBC: 0 % (ref 0.0–0.2)

## 2020-11-28 LAB — URINALYSIS, ROUTINE W REFLEX MICROSCOPIC
Bacteria, UA: NONE SEEN
Bilirubin Urine: NEGATIVE
Glucose, UA: NEGATIVE mg/dL
Hgb urine dipstick: NEGATIVE
Ketones, ur: 5 mg/dL — AB
Nitrite: NEGATIVE
Protein, ur: NEGATIVE mg/dL
Specific Gravity, Urine: 1.013 (ref 1.005–1.030)
pH: 5 (ref 5.0–8.0)

## 2020-11-28 LAB — COMPREHENSIVE METABOLIC PANEL
ALT: 15 U/L (ref 0–44)
AST: 23 U/L (ref 15–41)
Albumin: 4.1 g/dL (ref 3.5–5.0)
Alkaline Phosphatase: 67 U/L (ref 38–126)
Anion gap: 6 (ref 5–15)
BUN: 12 mg/dL (ref 6–20)
CO2: 27 mmol/L (ref 22–32)
Calcium: 10.4 mg/dL — ABNORMAL HIGH (ref 8.9–10.3)
Chloride: 105 mmol/L (ref 98–111)
Creatinine, Ser: 0.59 mg/dL (ref 0.44–1.00)
GFR, Estimated: >60 mL/min (ref >60)
Glucose, Bld: 97 mg/dL (ref 70–99)
Potassium: 3.6 mmol/L (ref 3.5–5.1)
Sodium: 138 mmol/L (ref 135–145)
Total Bilirubin: 0.8 mg/dL (ref 0.3–1.2)
Total Protein: 7.8 g/dL (ref 6.5–8.1)

## 2020-11-28 LAB — LIPASE, BLOOD: Lipase: 30 U/L (ref 11–51)

## 2020-11-28 NOTE — ED Provider Notes (Signed)
Emergency Medicine Provider Triage Evaluation Note  Carla Sloan , a 60 y.o. female  was evaluated in triage.  Pt complains of abdominal pain.  Symptoms started about 2 days ago.  They have been intermittent.  Reports pain along the central abdomen and right lateral abdomen.  No fevers, chills, nausea, vomiting, diarrhea, urinary complaints.  Physical Exam  BP (!) 190/103 (BP Location: Left Arm)   Pulse 77   Temp 98.1 F (36.7 C) (Oral)   Resp 18   LMP 09/25/2015 (Approximate)   SpO2 100%  Gen:   Awake, no distress   Resp:  Normal effort  MSK:   Moves extremities without difficulty  Other:    Medical Decision Making  Medically screening exam initiated at 7:24 PM.  Appropriate orders placed.  [name] was informed that the remainder of the evaluation will be completed by another provider, this initial triage assessment does not replace that evaluation, and the importance of remaining in the ED until their evaluation is complete.   Rayna Sexton, PA-C 11/28/20 1925    Luna Fuse, MD 11/30/20 [phone]

## 2020-11-28 NOTE — ED Triage Notes (Signed)
Pt complains of right sided abdominal pain since Monday.

## 2020-11-29 ENCOUNTER — Emergency Department (HOSPITAL_COMMUNITY): Payer: Medicare HMO

## 2020-11-29 ENCOUNTER — Encounter (HOSPITAL_COMMUNITY): Payer: Self-pay

## 2020-11-29 DIAGNOSIS — R1084 Generalized abdominal pain: Secondary | ICD-10-CM | POA: Diagnosis not present

## 2020-11-29 MED ORDER — HYDROCHLOROTHIAZIDE 25 MG PO TABS: 25.0000 mg | ORAL_TABLET | Freq: Every day | ORAL | 0 refills | Status: DC

## 2020-11-29 MED ORDER — SODIUM CHLORIDE (PF) 0.9 % IJ SOLN
INTRAMUSCULAR | Status: AC
  Filled 2020-11-29: qty 50

## 2020-11-29 MED ORDER — IOHEXOL 350 MG/ML SOLN
100.0000 mL | Freq: Once | INTRAVENOUS | Status: AC | PRN
  Administered 2020-11-29: 100 mL via INTRAVENOUS

## 2020-11-29 MED ORDER — AMLODIPINE BESYLATE 5 MG PO TABS
5.0000 mg | ORAL_TABLET | Freq: Once | ORAL | Status: AC
  Administered 2020-11-29: 5 mg via ORAL
  Filled 2020-11-29: qty 1

## 2020-11-29 MED ORDER — MORPHINE SULFATE (PF) 4 MG/ML IV SOLN
4.0000 mg | Freq: Once | INTRAVENOUS | Status: AC
  Administered 2020-11-29: 4 mg via INTRAVENOUS
  Filled 2020-11-29: qty 1

## 2020-11-29 MED ORDER — HYDROCHLOROTHIAZIDE 12.5 MG PO CAPS
12.5000 mg | ORAL_CAPSULE | Freq: Once | ORAL | Status: AC
  Administered 2020-11-29: 12.5 mg via ORAL
  Filled 2020-11-29: qty 1

## 2020-11-29 MED ORDER — AMLODIPINE BESYLATE 5 MG PO TABS: 5.0000 mg | ORAL_TABLET | Freq: Every day | ORAL | 0 refills | Status: DC

## 2020-11-29 NOTE — Discharge Instructions (Addendum)
Your workup was overall reassuring in the ED today without any obvious findings on CT scan.   Please follow up with your Pcp for further evaluation of your abdominal pain. Take Tylenol and Ibuprofen as needed for pain.   Please keep a log of your blood pressure readings to take with to your PCP's office.   Return to the ED for any new/worsening symptoms

## 2020-11-29 NOTE — ED Provider Notes (Signed)
[facility] DEPT Provider Note   CSN: [id number] Arrival date & time: 11/28/20  1850     History Chief Complaint  Patient presents with   Abdominal Pain    Carla Sloan is a 60 y.o. female with PMHx HTN who presents to the ED today with complaint of gradual onset, constant, achy, right sided abdominal pain that began 2-3 days ago.  It has not been taking anything specifically for her pain.  She denies any fevers, chills, nausea, vomiting, diarrhea, constipation, urinary symptoms.  Surgical history includes gastric sleeve done earlier this year.   The history is provided by the patient and medical records.      Past Medical History:  Diagnosis Date   Arthritis    Depression    Hypertension    Muscle spasms of lower extremity    and back   Sleep apnea     Patient Active Problem List   Diagnosis Date Noted   Morbid obesity ([place]) 04/24/2020    Past Surgical History:  Procedure Laterality Date   COLONOSCOPY WITH PROPOFOL N/A 09/24/2012   Procedure: COLONOSCOPY WITH PROPOFOL;  Surgeon: Jeryl Columbia, MD;  Location: [facility];  Service: Endoscopy;  Laterality: N/A;   COLONOSCOPY WITH PROPOFOL N/A 05/19/2016   Procedure: COLONOSCOPY WITH PROPOFOL;  Surgeon: Clarene Essex, MD;  Location: [facility] ENDOSCOPY;  Service: Endoscopy;  Laterality: N/A;   LAPAROSCOPIC GASTRIC SLEEVE RESECTION N/A 04/24/2020   Procedure: LAPAROSCOPIC GASTRIC SLEEVE RESECTION;  Surgeon: Clovis Riley, MD;  Location: [facility];  Service: General;  Laterality: N/A;   NO PAST SURGERIES     TUBAL LIGATION     UPPER GI ENDOSCOPY N/A 04/24/2020   Procedure: UPPER GI ENDOSCOPY;  Surgeon: Clovis Riley, MD;  Location: [facility];  Service: General;  Laterality: N/A;     OB History     Gravida  2   Para  2   Term  2   Preterm      AB      Living  2      SAB      IAB      Ectopic      Multiple      Live Births  2           Family History  Problem  Relation Age of Onset   Hypertension Mother    Diabetes Mother    Breast cancer Mother    Diabetes Father    Hypertension Father     Social History   Tobacco Use   Smoking status: Never   Smokeless tobacco: Never  Vaping Use   Vaping Use: Never used  Substance Use Topics   Alcohol use: No   Drug use: No    Home Medications Prior to Admission medications   Medication Sig Start Date End Date Taking? Authorizing Provider  amLODipine (NORVASC) 5 MG tablet Take 1 tablet (5 mg total) by mouth daily. 11/29/20 12/29/20 Yes Robbi Spells, PA-C  hydrochlorothiazide (HYDRODIURIL) 25 MG tablet Take 1 tablet (25 mg total) by mouth daily. 11/29/20 12/29/20 Yes Hermila Millis, PA-C  acetaminophen (TYLENOL) 500 MG tablet Take 1 tablet (500 mg total) by mouth every 6 (six) hours as needed. 05/11/20   Hughie Closs, PA-C  azelastine (OPTIVAR) 0.05 % ophthalmic solution Place 1 drop into both eyes 2 (two) times daily.    [provider]  bimatoprost (LUMIGAN) 0.03 % ophthalmic solution Place 1 drop into both eyes at bedtime.  [provider]  brimonidine-timolol (COMBIGAN) 0.2-0.5 % ophthalmic solution Place 1 drop into both eyes every 12 (twelve) hours.    [provider]  calcium citrate-vitamin D (CELEBRATE CALCIUM CITRATE) 500-500 MG-UNIT chewable tablet Chew 1 tablet by mouth every evening.    [provider]  CEQUA 0.09 % SOLN Place 1 drop into both eyes 2 (two) times daily. 11/17/19   [provider]  citalopram (CELEXA) 40 MG tablet Take 40 mg by mouth daily.    [provider]  cyclobenzaprine (FLEXERIL) 5 MG tablet Take 5 mg by mouth 3 (three) times daily. 06/20/13   [provider]  diclofenac Sodium (VOLTAREN) 1 % GEL Apply 4 g topically 4 (four) times daily. 08/02/20   Hazel Sams, PA-C  Elastic Bandages & Supports (WRIST SPLINT/COCK-UP/LEFT L) MISC Wear nightly and with exacerbating activities. 10/24/19   Patriciaann Clan, DO  Elastic Bandages & Supports (WRIST SPLINT/COCK-UP/RIGHT L) MISC Wear nightly and with exacerbating activities. 10/24/19   Patriciaann Clan, DO  ergocalciferol (VITAMIN D2) 1.25 MG (50000 UT) capsule Take 50,000 Units by mouth once a week.    [provider]  ferrous sulfate 324 MG TBEC Take 324 mg by mouth daily with breakfast.    [provider]  gabapentin (NEURONTIN) 800 MG tablet Take 800-1,600 mg by mouth 2 (two) times daily. Take 800 mg in the morning and 1600 mg at bedtime    [provider]  hydrOXYzine (ATARAX/VISTARIL) 25 MG tablet Take 1-2 tablets (25-50 mg total) by mouth at bedtime as needed. Take for insomnia. Patient taking differently: Take 100 mg by mouth at bedtime. Take for insomnia. 05/21/19   Jaynee Eagles, PA-C  Lurasidone HCl (LATUDA) 60 MG TABS Take 60 mg by mouth at bedtime.    [provider]  Multiple Vitamins-Minerals (CELEBRATE MULTI-COMPLETE 44) CHEW Chew 1 each by mouth daily.    [provider]  ondansetron (ZOFRAN-ODT) 4 MG disintegrating tablet Take 1 tablet (4 mg total) by mouth every 6 (six) hours as needed for nausea or vomiting. 04/25/20   Clovis Riley, MD  pantoprazole (PROTONIX) 40 MG tablet Take 1 tablet (40 mg total) by mouth daily. 04/25/20   Clovis Riley, MD  pravastatin (PRAVACHOL) 20 MG tablet Take 20 mg by mouth daily. 12/02/19   [provider]  traMADol (ULTRAM) 50 MG tablet Take 1 tablet (50 mg total) by mouth every 6 (six) hours as needed (pain). 04/25/20   Clovis Riley, MD    Allergies    Patient has no known allergies.  Review of Systems   Review of Systems  Constitutional:  Negative for chills and fever.  Gastrointestinal:  Positive for abdominal pain. Negative for constipation, diarrhea, nausea and vomiting.  Genitourinary:  Negative for decreased urine volume, dysuria, flank pain, frequency and vaginal discharge.  All other systems reviewed and are negative.  Physical  Exam Updated Vital Signs BP (!) 164/72 (BP Location: Right Arm)   Pulse 65   Temp 98.1 F (36.7 C) (Oral)   Resp 16   LMP 09/25/2015 (Approximate)   SpO2 100%   Physical Exam Vitals and nursing note reviewed.  Constitutional:      Appearance: She is not ill-appearing or diaphoretic.  HENT:     Head: Normocephalic and atraumatic.  Eyes:     Conjunctiva/sclera: Conjunctivae normal.  Cardiovascular:     Rate and Rhythm: Normal rate and regular rhythm.  Pulmonary:     Effort: Pulmonary  effort is normal.     Breath sounds: Normal breath sounds. No wheezing, rhonchi or rales.  Abdominal:     Palpations: Abdomen is soft.     Tenderness: There is abdominal tenderness in the periumbilical area. There is no right CVA tenderness, left CVA tenderness, guarding or rebound.  Musculoskeletal:     Cervical back: Neck supple.  Skin:    General: Skin is warm and dry.  Neurological:     Mental Status: She is alert.    ED Results / Procedures / Treatments   Labs (all labs ordered are listed, but only abnormal results are displayed) Labs Reviewed  COMPREHENSIVE METABOLIC PANEL - Abnormal; Notable for the following components:      Result Value   Calcium 10.4 (*)    All other components within normal limits  URINALYSIS, ROUTINE W REFLEX MICROSCOPIC - Abnormal; Notable for the following components:   Ketones, ur 5 (*)    Leukocytes,Ua TRACE (*)    All other components within normal limits  CBC WITH DIFFERENTIAL/PLATELET  LIPASE, BLOOD    EKG None  Radiology CT Abdomen Pelvis W Contrast  Result Date: 11/29/2020 CLINICAL DATA:  Right-sided abdominal pain. EXAM: CT ABDOMEN AND PELVIS WITH CONTRAST TECHNIQUE: Multidetector CT imaging of the abdomen and pelvis was performed using the standard protocol following bolus administration of intravenous contrast. CONTRAST:  174mL OMNIPAQUE IOHEXOL 350 MG/ML SOLN COMPARISON:  November 15, 2010 FINDINGS: Lower chest: Very mild atelectasis is seen  within the bilateral lung bases. There is elevation of the left hemidiaphragm. Hepatobiliary: No focal liver abnormality is seen. No gallstones, gallbladder wall thickening, or biliary dilatation. Pancreas: Unremarkable. No pancreatic ductal dilatation or surrounding inflammatory changes. Spleen: Normal in size without focal abnormality. Adrenals/Urinary Tract: Adrenal glands are unremarkable. Kidneys are normal, without renal calculi, focal lesion, or hydronephrosis. Bladder is unremarkable. Stomach/Bowel: Surgical sutures are seen within the gastric region. Appendix appears normal. No evidence of bowel wall thickening, distention, or inflammatory changes. Vascular/Lymphatic: No significant vascular findings are present. No enlarged abdominal or pelvic lymph nodes. Reproductive: Uterus and bilateral adnexa are unremarkable. Other: No abdominal wall hernia or abnormality. No abdominopelvic ascites. Musculoskeletal: No acute or significant osseous findings. IMPRESSION: 1. Evidence of prior gastric surgery. 2. No evidence of acute or active process within the abdomen or pelvis. Electronically Signed   By: Virgina Norfolk M.D.   On: 11/29/2020 03:11    Procedures Procedures   Medications Ordered in ED Medications  sodium chloride (PF) 0.9 % injection (has no administration in time range)  amLODipine (NORVASC) tablet 5 mg (5 mg Oral Given 11/29/20 0207)  hydrochlorothiazide (MICROZIDE) capsule 12.5 mg (12.5 mg Oral Given 11/29/20 0207)  morphine 4 MG/ML injection 4 mg (4 mg Intravenous Given 11/29/20 0206)  iohexol (OMNIPAQUE) 350 MG/ML injection 100 mL (100 mLs Intravenous Contrast Given 11/29/20 0244)    ED Course  I have reviewed the triage vital signs and the nursing notes.  Pertinent labs & imaging results that were available during my care of the patient were reviewed by me and considered in my medical decision making (see chart for details).  Clinical Course as of 11/29/20 0318  Thu Nov 29, 2020   0259 BP(!): 164/72 [MV]    Clinical Course User Index [MV] Eustaquio Maize, PA-C   MDM Rules/Calculators/A&P                           60 year old female who presents to the  ED today complaining of right-sided abdominal pain for the past 2 days.  On arrival to the ED patient is afebrile, nontachycardic and nontachypneic.  Blood pressure significantly elevated at 190/103 with repeat 185/103.  She was medically screened and lab work started including CBC, CMP, lipase, urinalysis which have all returned unremarkable.  On my exam she is noted to have periumbilical abdominal tenderness palpation without rebound or guarding.  She does have a history of gastric sleeve surgery done earlier this year and therefore we will plan for CT abdomen and pelvis to assess for any abnormalities.  Patient does mention that prior to having procedure she was on blood pressure medication, amlodipine 5 mg and HCTZ 12.5 mg however was taken off of it as they suspected that her blood pressure would decrease with significant weight loss.  She does admit she has not been checking her blood pressure at home over the past several months.  We will plan to restart on amlodipine and HCTZ in the ED and reevaluate.  CT: IMPRESSION:  1. Evidence of prior gastric surgery.  2. No evidence of acute or active process within the abdomen or  pelvis.   Repeat  blood pressure 164/72.  Workup overall reassuring. Will discharge pt home at this time with PCP follow up. Will start back on BP meds today. Pt in agreement with plan and stable for discharge home.   This note was prepared using Dragon voice recognition software and may include unintentional dictation errors due to the inherent limitations of voice recognition software.   Final Clinical Impression(s) / ED Diagnoses Final diagnoses:  Generalized abdominal pain  Primary hypertension    Rx / DC Orders ED Discharge Orders          Ordered    amLODipine (NORVASC) 5 MG  tablet  Daily        11/29/20 0317    hydrochlorothiazide (HYDRODIURIL) 25 MG tablet  Daily        11/29/20 0317             Discharge Instructions      Your workup was overall reassuring in the ED today without any obvious findings on CT scan.   Please follow up with your Pcp for further evaluation of your abdominal pain. Take Tylenol and Ibuprofen as needed for pain.   Please keep a log of your blood pressure readings to take with to your PCP's office.   Return to the ED for any new/worsening symptoms       Eustaquio Maize, PA-C 11/29/20 [id number]    Drenda Freeze, MD 11/29/20 [phone]

## 2020-12-03 ENCOUNTER — Encounter: Payer: Medicare HMO | Attending: Surgery | Admitting: Skilled Nursing Facility1

## 2020-12-03 ENCOUNTER — Other Ambulatory Visit: Payer: Self-pay

## 2020-12-03 DIAGNOSIS — Z9884 Bariatric surgery status: Secondary | ICD-10-CM | POA: Insufficient documentation

## 2020-12-03 DIAGNOSIS — Z6841 Body Mass Index (BMI) 40.0 and over, adult: Secondary | ICD-10-CM | POA: Diagnosis not present

## 2020-12-03 NOTE — Progress Notes (Signed)
Follow-up visit:  Post-Operative sleeve Surgery   Anthropometrics  Surgery date: 04/24/2020 Surgery type: sleeve Start weight at [facility]: 411.3 Weight today: 271.3 pounds   Body Composition Scale 05/08/2020 06/19/2020 12/03/2020  Current Body Weight 356.7 336.2 271.3  Total Body Fat % 52.6 51.6 47.5  Visceral Fat 28 26 19   Fat-Free Mass % 47.3 48.3 52.4   Total Body Water % 38.1 38.6 40.7  Muscle-Mass lbs 32.5 32.3 31.5  BMI 57.6 54.3 43.8  Body Fat Displacement            Torso  lbs 116.5 107.8 80         Left Leg  lbs 23.3 21.5 16         Right Leg  lbs 23.3 21.5 16         Left Arm  lbs 11.6 10.7 8         Right Arm   lbs 11.6 10.7 8    Pt states she needs to stick to supplements her insurance covers due to finances: dietitian picked out the supplements she needs but is still missing iron.  Pt states she got her eye surgery which went well and states she can see so much better.  Pt state she was going to start smoothies again: dietitian advised not to purchase smoothies or juice.   24 hr recall: Breakfast: 2 linked sausage Snack: Lunch: lettuce, cucumber, mayo, tomato  Snack: Dinner: mashed potatoes, corn, collards, chicken  Fluid intake: adequate: water 64 fluid ounces   Medications: See List Supplementation: bari and calcium     Using straws: no Drinking while eating: no Having you been chewing well: yes Chewing/swallowing difficulties: no Changes in vision: no Changes to mood/headaches: no Hair loss/Cahnges to skin/Changes to nails: no Any difficulty focusing or concentrating: no Sweating: no Dizziness/Lightheaded: no Palpitations: no  Carbonated beverages: no N/V/D/C/GAS: no Abdominal Pain: no Dumping syndrome: no  Recent physical activity:  Speed walking 1.5 miles 6 days a week  Progress Towards Goal(s):  In Progress Teaching method utilized: Environmental health practitioner & Auditory  Demonstrated degree of understanding via: Teach Back  Readiness Level: Action Barriers  to learning/adherence to lifestyle change: none identified  Goals: -add a protein source to your lunch  -add in fruit throughout the week; limiting to one serving per day  Teaching Method Utilized:  Visual Auditory Hands on  Demonstrated degree of understanding via:  Teach Back   Monitoring/Evaluation:  Dietary intake, exercise, and body weight.

## 2021-02-14 ENCOUNTER — Ambulatory Visit (HOSPITAL_COMMUNITY)
Admission: EM | Admit: 2021-02-14 | Discharge: 2021-02-14 | Disposition: A | Discharge status: Home / Self Care | Payer: Medicare HMO | Attending: Student | Admitting: Student

## 2021-02-14 ENCOUNTER — Encounter (HOSPITAL_COMMUNITY): Payer: Self-pay | Admitting: Emergency Medicine

## 2021-02-14 DIAGNOSIS — Z1152 Encounter for screening for COVID-19: Secondary | ICD-10-CM | POA: Diagnosis present

## 2021-02-14 DIAGNOSIS — Z20822 Contact with and (suspected) exposure to covid-19: Secondary | ICD-10-CM

## 2021-02-14 DIAGNOSIS — J069 Acute upper respiratory infection, unspecified: Secondary | ICD-10-CM | POA: Diagnosis present

## 2021-02-14 LAB — SARS CORONAVIRUS 2 (TAT 6-24 HRS): SARS Coronavirus 2: POSITIVE — AB

## 2021-02-14 NOTE — ED Provider Notes (Signed)
[place]    CSN: [id number] Arrival date & time: 02/14/21  0945      History   Chief Complaint Chief Complaint  Patient presents with   Cough   Nasal Congestion    HPI Carla Sloan is a 60 y.o. female presenting with cough and congestion x1 week following exposure to COVID. Medical history noncontributory, denies history of pulmonary disease.  Describes cough and congestion for 1 week, nonproductive.  Denies shortness of breath, chest pain, dizziness, weakness.  Has not monitored temperature at home.  Has not attempted interventions at home.  Denies nausea, vomiting, diarrhea.   HPI  Past Medical History:  Diagnosis Date   Arthritis    Depression    Hypertension    Muscle spasms of lower extremity    and back   Sleep apnea     Patient Active Problem List   Diagnosis Date Noted   Morbid obesity ([place]) 04/24/2020    Past Surgical History:  Procedure Laterality Date   COLONOSCOPY WITH PROPOFOL N/A 09/24/2012   Procedure: COLONOSCOPY WITH PROPOFOL;  Surgeon: Jeryl Columbia, MD;  Location: [facility] ENDOSCOPY;  Service: Endoscopy;  Laterality: N/A;   COLONOSCOPY WITH PROPOFOL N/A 05/19/2016   Procedure: COLONOSCOPY WITH PROPOFOL;  Surgeon: Clarene Essex, MD;  Location: [facility] ENDOSCOPY;  Service: Endoscopy;  Laterality: N/A;   LAPAROSCOPIC GASTRIC SLEEVE RESECTION N/A 04/24/2020   Procedure: LAPAROSCOPIC GASTRIC SLEEVE RESECTION;  Surgeon: Clovis Riley, MD;  Location: [facility] ORS;  Service: General;  Laterality: N/A;   NO PAST SURGERIES     TUBAL LIGATION     UPPER GI ENDOSCOPY N/A 04/24/2020   Procedure: UPPER GI ENDOSCOPY;  Surgeon: Clovis Riley, MD;  Location: [facility] ORS;  Service: General;  Laterality: N/A;    OB History     Gravida  2   Para  2   Term  2   Preterm      AB      Living  2      SAB      IAB      Ectopic      Multiple      Live Births  2            Home Medications    Prior to Admission medications   Medication  Sig Start Date End Date Taking? Authorizing Provider  acetaminophen (TYLENOL) 500 MG tablet Take 1 tablet (500 mg total) by mouth every 6 (six) hours as needed. 05/11/20   Hughie Closs, PA-C  amLODipine (NORVASC) 5 MG tablet Take 1 tablet (5 mg total) by mouth daily. 11/29/20 12/29/20  Eustaquio Maize, PA-C  azelastine (OPTIVAR) 0.05 % ophthalmic solution Place 1 drop into both eyes 2 (two) times daily.    [provider]  bimatoprost (LUMIGAN) 0.03 % ophthalmic solution Place 1 drop into both eyes at bedtime.    [provider]  brimonidine-timolol (COMBIGAN) 0.2-0.5 % ophthalmic solution Place 1 drop into both eyes every 12 (twelve) hours.    [provider]  calcium citrate-vitamin D (CELEBRATE CALCIUM CITRATE) 500-500 MG-UNIT chewable tablet Chew 1 tablet by mouth every evening.    [provider]  CEQUA 0.09 % SOLN Place 1 drop into both eyes 2 (two) times daily. 11/17/19   [provider]  citalopram (CELEXA) 40 MG tablet Take 40 mg by mouth daily.    [provider]  cyclobenzaprine (FLEXERIL) 5 MG tablet Take 5 mg by mouth 3 (three) times daily.  06/20/13   [provider]  diclofenac Sodium (VOLTAREN) 1 % GEL Apply 4 g topically 4 (four) times daily. 08/02/20   Hazel Sams, PA-C  Elastic Bandages & Supports (WRIST SPLINT/COCK-UP/LEFT L) MISC Wear nightly and with exacerbating activities. 10/24/19   Patriciaann Clan, DO  Elastic Bandages & Supports (WRIST SPLINT/COCK-UP/RIGHT L) MISC Wear nightly and with exacerbating activities. 10/24/19   Patriciaann Clan, DO  ergocalciferol (VITAMIN D2) 1.25 MG (50000 UT) capsule Take 50,000 Units by mouth once a week.    [provider]  ferrous sulfate 324 MG TBEC Take 324 mg by mouth daily with breakfast.    [provider]  gabapentin (NEURONTIN) 800 MG tablet Take 800-1,600 mg by mouth 2 (two) times daily. Take 800 mg in the morning and 1600 mg at bedtime    [provider]  hydrochlorothiazide (HYDRODIURIL) 25 MG tablet Take 1 tablet (25 mg total) by mouth daily. 11/29/20 12/29/20  Eustaquio Maize, PA-C  hydrOXYzine (ATARAX/VISTARIL) 25 MG tablet Take 1-2 tablets (25-50 mg total) by mouth at bedtime as needed. Take for insomnia. Patient taking differently: Take 100 mg by mouth at bedtime. Take for insomnia. 05/21/19   Jaynee Eagles, PA-C  Lurasidone HCl (LATUDA) 60 MG TABS Take 60 mg by mouth at bedtime.    [provider]  Multiple Vitamins-Minerals (CELEBRATE MULTI-COMPLETE 61) CHEW Chew 1 each by mouth daily.    [provider]  ondansetron (ZOFRAN-ODT) 4 MG disintegrating tablet Take 1 tablet (4 mg total) by mouth every 6 (six) hours as needed for nausea or vomiting. 04/25/20   Clovis Riley, MD  pantoprazole (PROTONIX) 40 MG tablet Take 1 tablet (40 mg total) by mouth daily. 04/25/20   Clovis Riley, MD  pravastatin (PRAVACHOL) 20 MG tablet Take 20 mg by mouth daily. 12/02/19   [provider]  traMADol (ULTRAM) 50 MG tablet Take 1 tablet (50 mg total) by mouth every 6 (six) hours as needed (pain). 04/25/20   Clovis Riley, MD    Family History Family History  Problem Relation Age of Onset   Hypertension Mother    Diabetes Mother    Breast cancer Mother    Diabetes Father    Hypertension Father     Social History Social History   Tobacco Use   Smoking status: Never   Smokeless tobacco: Never  Vaping Use   Vaping Use: Never used  Substance Use Topics   Alcohol use: No   Drug use: No     Allergies   Patient has no known allergies.   Review of Systems Review of Systems  Constitutional:  Negative for appetite change, chills and fever.  HENT:  Positive for congestion. Negative for ear pain, rhinorrhea, sinus pressure, sinus pain and sore throat.   Eyes:  Negative for redness and visual disturbance.  Respiratory:  Positive for cough. Negative for chest tightness, shortness of breath and wheezing.    Cardiovascular:  Negative for chest pain and palpitations.  Gastrointestinal:  Negative for abdominal pain, constipation, diarrhea, nausea and vomiting.  Genitourinary:  Negative for dysuria, frequency and urgency.  Musculoskeletal:  Negative for myalgias.  Neurological:  Negative for dizziness, weakness and headaches.  Psychiatric/Behavioral:  Negative for confusion.   All other systems reviewed and are negative.   Physical Exam Triage Vital Signs ED Triage Vitals  Enc Vitals Group     BP 02/14/21 1001 137/74     Pulse Rate 02/14/21 1001 78  Resp 02/14/21 1001 17     Temp 02/14/21 1001 97.9 F (36.6 C)     Temp Source 02/14/21 1001 Oral     SpO2 02/14/21 1001 99 %     Weight --      Height --      Head Circumference --      Peak Flow --      Pain Score 02/14/21 1000 0     Pain Loc --      Pain Edu? --      Excl. in [place]? --    No data found.  Updated Vital Signs BP 137/74    Pulse 78    Temp 97.9 F (36.6 C) (Oral)    Resp 17    LMP 09/25/2015 (Approximate)    SpO2 99%   Visual Acuity Right Eye Distance:   Left Eye Distance:   Bilateral Distance:    Right Eye Near:   Left Eye Near:    Bilateral Near:     Physical Exam Vitals reviewed.  Constitutional:      General: She is not in acute distress.    Appearance: Normal appearance. She is not ill-appearing.  HENT:     Head: Normocephalic and atraumatic.     Right Ear: Tympanic membrane, ear canal and external ear normal. No tenderness. No middle ear effusion. There is no impacted cerumen. Tympanic membrane is not perforated, erythematous, retracted or bulging.     Left Ear: Tympanic membrane, ear canal and external ear normal. No tenderness.  No middle ear effusion. There is no impacted cerumen. Tympanic membrane is not perforated, erythematous, retracted or bulging.     Nose: Nose normal. No congestion.     Mouth/Throat:     Mouth: Mucous membranes are moist.     Pharynx: Uvula midline. No oropharyngeal  exudate or posterior oropharyngeal erythema.  Eyes:     Extraocular Movements: Extraocular movements intact.     Pupils: Pupils are equal, round, and reactive to light.  Cardiovascular:     Rate and Rhythm: Normal rate and regular rhythm.     Heart sounds: Normal heart sounds.  Pulmonary:     Effort: Pulmonary effort is normal.     Breath sounds: Normal breath sounds. No decreased breath sounds, wheezing, rhonchi or rales.  Abdominal:     Palpations: Abdomen is soft.     Tenderness: There is no abdominal tenderness. There is no guarding or rebound.  Lymphadenopathy:     Cervical: No cervical adenopathy.     Right cervical: No superficial cervical adenopathy.    Left cervical: No superficial cervical adenopathy.  Neurological:     General: No focal deficit present.     Mental Status: She is alert and oriented to person, place, and time.  Psychiatric:        Mood and Affect: Mood normal.        Behavior: Behavior normal.        Thought Content: Thought content normal.        Judgment: Judgment normal.     UC Treatments / Results  Labs (all labs ordered are listed, but only abnormal results are displayed) Labs Reviewed  SARS CORONAVIRUS 2 (TAT 6-24 HRS)    EKG   Radiology No results found.  Procedures Procedures (including critical care time)  Medications Ordered in UC Medications - No data to display  Initial Impression / Assessment and Plan / UC Course  I have reviewed the triage vital signs and the  nursing notes.  Pertinent labs & imaging results that were available during my care of the patient were reviewed by me and considered in my medical decision making (see chart for details).     This patient is a very pleasant 60 y.o. year old female presenting with suspected covid-19 following exposure to this. Today this pt is afebrile nontachycardic nontachypneic, oxygenating well on room air, no wheezes rhonchi or rales. Symptoms are resolving on their own  Covid  PCR sent at patient request.  She prefers OTC medications for symptomatic relief.   Final Clinical Impressions(s) / UC Diagnoses   Final diagnoses:  Viral URI with cough  Exposure to confirmed case of COVID-19  Encounter for screening for COVID-19     Discharge Instructions      -With a virus, you're typically contagious for 5-7 days, or as long as you're having fevers.       ED Prescriptions   None    PDMP not reviewed this encounter.   Hazel Sams, PA-C 02/14/21 1030

## 2021-02-14 NOTE — ED Triage Notes (Signed)
Pt is present today with a cough and nasal congestion. Pt sx started one week ago.

## 2021-02-14 NOTE — Discharge Instructions (Addendum)
-  With a virus, you're typically contagious for 5-7 days, or as long as you're having fevers.

## 2021-03-05 ENCOUNTER — Encounter: Payer: Medicare HMO | Attending: Surgery | Admitting: Skilled Nursing Facility1

## 2021-03-05 ENCOUNTER — Other Ambulatory Visit: Payer: Self-pay

## 2021-03-05 DIAGNOSIS — Z713 Dietary counseling and surveillance: Secondary | ICD-10-CM | POA: Diagnosis not present

## 2021-03-05 DIAGNOSIS — Z6841 Body Mass Index (BMI) 40.0 and over, adult: Secondary | ICD-10-CM | POA: Diagnosis not present

## 2021-03-05 NOTE — Progress Notes (Signed)
Follow-up visit:  Post-Operative sleeve Surgery   Anthropometrics  Surgery date: 04/24/2020 Surgery type: sleeve Start weight at [facility]: 411.3 Weight today: 257.7 pounds   Body Composition Scale 05/08/2020 06/19/2020 12/03/2020 03/05/2021  Current Body Weight 356.7 336.2 271.3 257.7  Total Body Fat % 52.6 51.6 47.5 46.4  Visceral Fat 28 26 19 18   Fat-Free Mass % 47.3 48.3 52.4 53.5   Total Body Water % 38.1 38.6 40.7 41.2  Muscle-Mass lbs 32.5 32.3 31.5 31.4  BMI 57.6 54.3 43.8 41.5  Body Fat Displacement             Torso  lbs 116.5 107.8 80 74.1         Left Leg  lbs 23.3 21.5 16 14.8         Right Leg  lbs 23.3 21.5 16 14.8         Left Arm  lbs 11.6 10.7 8 7.4         Right Arm   lbs 11.6 10.7 8 7.4    Pt states her heel has been hurting lately: Dietitian advised pt to stick to the stationary bike instead of treadmill until her heel feels better.  Pt states she would like to lose enough weight to not need a knee surgery.  Pt states she does not eat fruit every day just randomly in the week.  Pt states she was taking other multivitamins that were not bariatric and felt her energy level was low so she went back to the bariatric multivitamins.  Pt states she would like to add in rice and grits.  Pt states the only oil she cooks with is olive oil. Pt states she wonders about protein bars. Pt states she does fry her chicken every 2 weeks: dietitian advised to not increase that behavior.    24 hr recall: Breakfast: 2 linked sausage or protein oatmeal Snack: protein shake Lunch: grilled chicken sandwich + cabbage Snack: roasted peanuts or sometimes grapes Dinner: mashed potatoes, corn, collards, chicken or chicken + cabbage + corn  Fluid intake: water 60 fluid ounces, sugar free jello, sometimes unsweet tea  Medications: See List Supplementation: bari and calcium     Using straws: no Drinking while eating: no Having you been chewing well: yes Chewing/swallowing  difficulties: no Changes in vision: no Changes to mood/headaches: no Hair loss/Cahnges to skin/Changes to nails: no Any difficulty focusing or concentrating: no Sweating: no Dizziness/Lightheaded: no Palpitations: no  Carbonated beverages: no N/V/D/C/GAS: no Abdominal Pain: no Dumping syndrome: no  Recent physical activity:  Speed walking 1.5 miles 6 days a week  Progress Towards Goal(s):  In Progress Teaching method utilized: Environmental health practitioner & Auditory  Demonstrated degree of understanding via: Teach Back  Readiness Level: Action Barriers to learning/adherence to lifestyle change: none identified  Goals: -do brown rice instead of white   Teaching Method Utilized:  Visual Auditory Hands on  Demonstrated degree of understanding via:  Teach Back   Monitoring/Evaluation:  Dietary intake, exercise, and body weight.

## 2021-03-13 DIAGNOSIS — G894 Chronic pain syndrome: Secondary | ICD-10-CM | POA: Insufficient documentation

## 2021-03-27 ENCOUNTER — Other Ambulatory Visit: Payer: Self-pay | Admitting: Internal Medicine

## 2021-03-28 LAB — COMPLETE METABOLIC PANEL WITH GFR
AG Ratio: 1.2 (calc) (ref 1.0–2.5)
ALT: 23 U/L (ref 6–29)
AST: 38 U/L — ABNORMAL HIGH (ref 10–35)
Albumin: 4.1 g/dL (ref 3.6–5.1)
Alkaline phosphatase (APISO): 98 U/L (ref 37–153)
BUN: 18 mg/dL (ref 7–25)
CO2: 15 mmol/L — ABNORMAL LOW (ref 20–32)
Calcium: 10.1 mg/dL (ref 8.6–10.4)
Chloride: 107 mmol/L (ref 98–110)
Creat: 0.73 mg/dL (ref 0.50–1.03)
Globulin: 3.4 g/dL (calc) (ref 1.9–3.7)
Glucose, Bld: 73 mg/dL (ref 65–99)
Potassium: 5.3 mmol/L (ref 3.5–5.3)
Sodium: 140 mmol/L (ref 135–146)
Total Bilirubin: 0.5 mg/dL (ref 0.2–1.2)
Total Protein: 7.5 g/dL (ref 6.1–8.1)
eGFR: 95 mL/min/{1.73_m2} (ref >=60)

## 2021-03-28 LAB — CBC
HCT: 42 % (ref 35.0–45.0)
Hemoglobin: 13.8 g/dL (ref 11.7–15.5)
MCH: 29.8 pg (ref 27.0–33.0)
MCHC: 32.9 g/dL (ref 32.0–36.0)
MCV: 90.7 fL (ref 80.0–100.0)
MPV: 11.5 fL (ref 7.5–12.5)
Platelets: 318 10*3/uL (ref 140–400)
RBC: 4.63 10*6/uL (ref 3.80–5.10)
RDW: 12.8 % (ref 11.0–15.0)
WBC: 7.3 10*3/uL (ref 3.8–10.8)

## 2021-03-28 LAB — LIPID PANEL
Cholesterol: 202 mg/dL — ABNORMAL HIGH (ref <200)
HDL: 66 mg/dL (ref >=50)
LDL Cholesterol (Calc): 121 mg/dL (calc) — ABNORMAL HIGH
Non-HDL Cholesterol (Calc): 136 mg/dL (calc) — ABNORMAL HIGH (ref <130)
Total CHOL/HDL Ratio: 3.1 (calc) (ref <5.0)
Triglycerides: 63 mg/dL (ref <150)

## 2021-03-28 LAB — VITAMIN D 25 HYDROXY (VIT D DEFICIENCY, FRACTURES): Vit D, 25-Hydroxy: 93 ng/mL (ref 30–100)

## 2021-03-28 LAB — TSH: TSH: 1.52 mIU/L (ref 0.40–4.50)

## 2021-03-30 ENCOUNTER — Emergency Department (HOSPITAL_COMMUNITY): Payer: Medicare HMO

## 2021-03-30 ENCOUNTER — Emergency Department (HOSPITAL_COMMUNITY)
Admission: EM | Admit: 2021-03-30 | Discharge: 2021-03-30 | Disposition: A | Discharge status: Home / Self Care | Payer: Medicare HMO | Attending: Emergency Medicine | Admitting: Emergency Medicine

## 2021-03-30 ENCOUNTER — Other Ambulatory Visit: Payer: Self-pay

## 2021-03-30 DIAGNOSIS — R42 Dizziness and giddiness: Secondary | ICD-10-CM | POA: Diagnosis present

## 2021-03-30 DIAGNOSIS — Z20822 Contact with and (suspected) exposure to covid-19: Secondary | ICD-10-CM | POA: Diagnosis not present

## 2021-03-30 DIAGNOSIS — E860 Dehydration: Secondary | ICD-10-CM | POA: Insufficient documentation

## 2021-03-30 DIAGNOSIS — G8929 Other chronic pain: Secondary | ICD-10-CM | POA: Diagnosis not present

## 2021-03-30 DIAGNOSIS — R4182 Altered mental status, unspecified: Secondary | ICD-10-CM | POA: Diagnosis not present

## 2021-03-30 DIAGNOSIS — I10 Essential (primary) hypertension: Secondary | ICD-10-CM | POA: Diagnosis not present

## 2021-03-30 DIAGNOSIS — M25569 Pain in unspecified knee: Secondary | ICD-10-CM | POA: Insufficient documentation

## 2021-03-30 DIAGNOSIS — Z79899 Other long term (current) drug therapy: Secondary | ICD-10-CM | POA: Insufficient documentation

## 2021-03-30 LAB — CBC
HCT: 35.7 % — ABNORMAL LOW (ref 36.0–46.0)
Hemoglobin: 11.6 g/dL — ABNORMAL LOW (ref 12.0–15.0)
MCH: 30.4 pg (ref 26.0–34.0)
MCHC: 32.5 g/dL (ref 30.0–36.0)
MCV: 93.5 fL (ref 80.0–100.0)
Platelets: 265 10*3/uL (ref 150–400)
RBC: 3.82 MIL/uL — ABNORMAL LOW (ref 3.87–5.11)
RDW: 14.2 % (ref 11.5–15.5)
WBC: 5.6 10*3/uL (ref 4.0–10.5)
nRBC: 0 % (ref 0.0–0.2)

## 2021-03-30 LAB — RESP PANEL BY RT-PCR (FLU A&B, COVID) ARPGX2
Influenza A by PCR: NEGATIVE
Influenza B by PCR: NEGATIVE
SARS Coronavirus 2 by RT PCR: NEGATIVE

## 2021-03-30 LAB — TROPONIN I (HIGH SENSITIVITY)
Troponin I (High Sensitivity): 7 ng/L (ref <18)
Troponin I (High Sensitivity): 6 ng/L (ref <18)

## 2021-03-30 LAB — BASIC METABOLIC PANEL
Anion gap: 7 (ref 5–15)
BUN: 12 mg/dL (ref 6–20)
CO2: 26 mmol/L (ref 22–32)
Calcium: 9.3 mg/dL (ref 8.9–10.3)
Chloride: 104 mmol/L (ref 98–111)
Creatinine, Ser: 0.67 mg/dL (ref 0.44–1.00)
GFR, Estimated: >60 mL/min (ref >60)
Glucose, Bld: 106 mg/dL — ABNORMAL HIGH (ref 70–99)
Potassium: 4.6 mmol/L (ref 3.5–5.1)
Sodium: 137 mmol/L (ref 135–145)

## 2021-03-30 LAB — URINALYSIS, ROUTINE W REFLEX MICROSCOPIC
Bilirubin Urine: NEGATIVE
Glucose, UA: NEGATIVE mg/dL
Hgb urine dipstick: NEGATIVE
Ketones, ur: NEGATIVE mg/dL
Nitrite: NEGATIVE
Protein, ur: NEGATIVE mg/dL
Specific Gravity, Urine: 1.025 (ref 1.005–1.030)
pH: 6 (ref 5.0–8.0)

## 2021-03-30 LAB — URINALYSIS, MICROSCOPIC (REFLEX)

## 2021-03-30 MED ORDER — SODIUM CHLORIDE 0.9 % IV BOLUS
1000.0000 mL | Freq: Once | INTRAVENOUS | Status: AC
  Administered 2021-03-30: 1000 mL via INTRAVENOUS

## 2021-03-30 NOTE — ED Triage Notes (Signed)
Pt here for intermittent dizziness since Wednesday, states it started again this morning. Pt denies HA or any LOC. 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

## 2021-03-30 NOTE — ED Provider Notes (Signed)
[facility] EMERGENCY DEPARTMENT Provider Note   CSN: [id number] Arrival date & time: 03/30/21  1827     History  Chief Complaint  Patient presents with   Dizziness    Carla Sloan is a 61 y.o. female.  Pt is a 61 yo bf with a hx of htn, depression, arthritis, and obesity.  She said she has had intermittent dizziness since Wednesday, 2/1.  She has a hard time describing the dizziness.  She does not feel dizzy when she moves her head.  It is worse when she stands up quickly.  She has some knee pain, but that is chronic.  She did fall on 1/30.  No h/a or loc.      Home Medications Prior to Admission medications   Medication Sig Start Date End Date Taking? Authorizing Provider  acetaminophen (TYLENOL) 500 MG tablet Take 1 tablet (500 mg total) by mouth every 6 (six) hours as needed. 05/11/20   Hughie Closs, PA-C  amLODipine (NORVASC) 5 MG tablet Take 1 tablet (5 mg total) by mouth daily. 11/29/20 12/29/20  Eustaquio Maize, PA-C  azelastine (OPTIVAR) 0.05 % ophthalmic solution Place 1 drop into both eyes 2 (two) times daily.    [provider]  bimatoprost (LUMIGAN) 0.03 % ophthalmic solution Place 1 drop into both eyes at bedtime.    [provider]  brimonidine-timolol (COMBIGAN) 0.2-0.5 % ophthalmic solution Place 1 drop into both eyes every 12 (twelve) hours.    [provider]  calcium citrate-vitamin D (CELEBRATE CALCIUM CITRATE) 500-500 MG-UNIT chewable tablet Chew 1 tablet by mouth every evening.    [provider]  CEQUA 0.09 % SOLN Place 1 drop into both eyes 2 (two) times daily. 11/17/19   [provider]  citalopram (CELEXA) 40 MG tablet Take 40 mg by mouth daily.    [provider]  cyclobenzaprine (FLEXERIL) 5 MG tablet Take 5 mg by mouth 3 (three) times daily. 06/20/13   [provider]  diclofenac Sodium (VOLTAREN) 1 % GEL Apply 4 g topically 4 (four) times daily. 08/02/20    Hazel Sams, PA-C  Elastic Bandages & Supports (WRIST SPLINT/COCK-UP/LEFT L) MISC Wear nightly and with exacerbating activities. 10/24/19   Patriciaann Clan, DO  Elastic Bandages & Supports (WRIST SPLINT/COCK-UP/RIGHT L) MISC Wear nightly and with exacerbating activities. 10/24/19   Patriciaann Clan, DO  ergocalciferol (VITAMIN D2) 1.25 MG (50000 UT) capsule Take 50,000 Units by mouth once a week.    [provider]  ferrous sulfate 324 MG TBEC Take 324 mg by mouth daily with breakfast.    [provider]  gabapentin (NEURONTIN) 800 MG tablet Take 800-1,600 mg by mouth 2 (two) times daily. Take 800 mg in the morning and 1600 mg at bedtime    [provider]  hydrochlorothiazide (HYDRODIURIL) 25 MG tablet Take 1 tablet (25 mg total) by mouth daily. 11/29/20 12/29/20  Eustaquio Maize, PA-C  hydrOXYzine (ATARAX/VISTARIL) 25 MG tablet Take 1-2 tablets (25-50 mg total) by mouth at bedtime as needed. Take for insomnia. Patient taking differently: Take 100 mg by mouth at bedtime. Take for insomnia. 05/21/19   Jaynee Eagles, PA-C  Lurasidone HCl (LATUDA) 60 MG TABS Take 60 mg by mouth at bedtime.    [provider]  Multiple Vitamins-Minerals (CELEBRATE MULTI-COMPLETE 6) CHEW Chew 1 each by mouth daily.    [provider]  ondansetron (ZOFRAN-ODT) 4 MG disintegrating tablet Take 1 tablet (4 mg total) by mouth  every 6 (six) hours as needed for nausea or vomiting. 04/25/20   Clovis Riley, MD  pantoprazole (PROTONIX) 40 MG tablet Take 1 tablet (40 mg total) by mouth daily. 04/25/20   Clovis Riley, MD  pravastatin (PRAVACHOL) 20 MG tablet Take 20 mg by mouth daily. 12/02/19   [provider]  traMADol (ULTRAM) 50 MG tablet Take 1 tablet (50 mg total) by mouth every 6 (six) hours as needed (pain). 04/25/20   Clovis Riley, MD      Allergies    Patient has no known allergies.    Review of Systems   Review of Systems  Neurological:  Positive for  dizziness.  All other systems reviewed and are negative.  Physical Exam Updated Vital Signs BP 108/66    Pulse 69    Temp 98 F (36.7 C) (Oral)    Resp 19    Ht 5\' 6"  (1.676 m)    Wt 113.9 kg    LMP 09/25/2015 (Approximate)    SpO2 100%    BMI 40.51 kg/m  Physical Exam Vitals and nursing note reviewed.  Constitutional:      Appearance: Normal appearance. She is obese.  HENT:     Head: Normocephalic and atraumatic.     Right Ear: External ear normal.     Left Ear: External ear normal.     Nose: Nose normal.     Mouth/Throat:     Mouth: Mucous membranes are dry.     Pharynx: Oropharynx is clear.  Eyes:     Conjunctiva/sclera: Conjunctivae normal.     Comments: strabismus  Cardiovascular:     Rate and Rhythm: Normal rate and regular rhythm.     Pulses: Normal pulses.     Heart sounds: Normal heart sounds.  Pulmonary:     Effort: Pulmonary effort is normal.     Breath sounds: Normal breath sounds.  Abdominal:     General: Abdomen is flat. Bowel sounds are normal.     Palpations: Abdomen is soft.  Musculoskeletal:        General: Normal range of motion.  Skin:    General: Skin is warm.     Capillary Refill: Capillary refill takes less than 2 seconds.  Neurological:     General: No focal deficit present.     Mental Status: She is alert and oriented to person, place, and time.  Psychiatric:        Mood and Affect: Mood normal.        Behavior: Behavior normal.        Thought Content: Thought content normal.        Judgment: Judgment normal.    ED Results / Procedures / Treatments   Labs (all labs ordered are listed, but only abnormal results are displayed) Labs Reviewed  BASIC METABOLIC PANEL - Abnormal; Notable for the following components:      Result Value   Glucose, Bld 106 (*)    All other components within normal limits  URINALYSIS, ROUTINE W REFLEX MICROSCOPIC - Abnormal; Notable for the following components:   Leukocytes,Ua SMALL (*)    All other components  within normal limits  URINALYSIS, MICROSCOPIC (REFLEX) - Abnormal; Notable for the following components:   Bacteria, UA RARE (*)    All other components within normal limits  CBC - Abnormal; Notable for the following components:   RBC 3.82 (*)    Hemoglobin 11.6 (*)    HCT 35.7 (*)    All other  components within normal limits  RESP PANEL BY RT-PCR (FLU A&B, COVID) ARPGX2  TROPONIN I (HIGH SENSITIVITY)  TROPONIN I (HIGH SENSITIVITY)    EKG EKG Interpretation  Date/Time:  Saturday March 30 2021 18:44:43 EST Ventricular Rate:  62 PR Interval:  186 QRS Duration: 109 QT Interval:  419 QTC Calculation: 426 R Axis:   55 Text Interpretation: Sinus rhythm Abnormal R-wave progression, early transition Probable inferior infarct, old Probable anterolateral infarct, old No significant change since last tracing Confirmed by Isla Pence [phone]) on 03/30/2021 7:32:43 PM  Radiology CT Head Wo Contrast  Result Date: 03/30/2021 CLINICAL DATA:  Head trauma, abnormal mental status. EXAM: CT HEAD WITHOUT CONTRAST TECHNIQUE: Contiguous axial images were obtained from the base of the skull through the vertex without intravenous contrast. RADIATION DOSE REDUCTION: This exam was performed according to the departmental dose-optimization program which includes automated exposure control, adjustment of the mA and/or kV according to patient size and/or use of iterative reconstruction technique. COMPARISON:  None. FINDINGS: Brain: No acute intracranial hemorrhage, midline shift or mass effect. No extra-axial fluid collection. Gray-white matter differentiation is within normal limits. No hydrocephalus. Vascular: No hyperdense vessel or unexpected calcification. Skull: Normal. Negative for fracture or focal lesion. Sinuses/Orbits: No acute finding. Other: None. IMPRESSION: No acute intracranial process. Electronically Signed   By: Brett Fairy M.D.   On: 03/30/2021 20:19   DG Chest Port 1 View  Result Date:  03/30/2021 CLINICAL DATA:  Dizziness EXAM: PORTABLE CHEST 1 VIEW COMPARISON:  11/23/2019 FINDINGS: Prominent elevation of the left hemidiaphragm with colonic interposition. No change since prior study. Heart size and pulmonary vascularity are normal for technique. Mild atelectasis in the left base. No airspace disease or consolidation suggested. No pleural effusions. No pneumothorax. Mediastinal contours appear intact. IMPRESSION: Chronic elevation of the left hemidiaphragm, similar to prior study. No evidence of active pulmonary disease. Electronically Signed   By: Lucienne Capers M.D.   On: 03/30/2021 19:25    Procedures Procedures    Medications Ordered in ED Medications  sodium chloride 0.9 % bolus 1,000 mL (1,000 mLs Intravenous New Bag/Given 03/30/21 1939)    ED Course/ Medical Decision Making/ A&P                           Medical Decision Making Amount and/or Complexity of Data Reviewed Labs: ordered. Radiology: ordered.   Pt is given IVFs as sx occur more with sitting and standing.  She feels much better.  She is able to get up and ambulate to the bathroom without any problems.  Labs reviewed and show some chronic anemia, but nothing acute.  Pt's CT head and CXR are reviewed and are negative.  Pt is stable for d/c.  Return if worse.  F/u with pcp.        Final Clinical Impression(s) / ED Diagnoses Final diagnoses:  Dehydration    Rx / DC Orders ED Discharge Orders     None         Isla Pence, MD 03/30/21 2221

## 2021-04-17 ENCOUNTER — Other Ambulatory Visit: Payer: Self-pay | Admitting: Internal Medicine

## 2021-04-17 DIAGNOSIS — Z1231 Encounter for screening mammogram for malignant neoplasm of breast: Secondary | ICD-10-CM

## 2021-04-20 ENCOUNTER — Other Ambulatory Visit: Payer: Self-pay

## 2021-04-20 ENCOUNTER — Ambulatory Visit (HOSPITAL_COMMUNITY)
Admission: EM | Admit: 2021-04-20 | Discharge: 2021-04-20 | Disposition: A | Discharge status: Home / Self Care | Payer: Medicare HMO | Attending: Physician Assistant | Admitting: Physician Assistant

## 2021-04-20 ENCOUNTER — Encounter (HOSPITAL_COMMUNITY): Payer: Self-pay | Admitting: Emergency Medicine

## 2021-04-20 DIAGNOSIS — R8290 Unspecified abnormal findings in urine: Secondary | ICD-10-CM

## 2021-04-20 DIAGNOSIS — M79605 Pain in left leg: Secondary | ICD-10-CM

## 2021-04-20 DIAGNOSIS — R1030 Lower abdominal pain, unspecified: Secondary | ICD-10-CM | POA: Diagnosis present

## 2021-04-20 DIAGNOSIS — Z113 Encounter for screening for infections with a predominantly sexual mode of transmission: Secondary | ICD-10-CM | POA: Diagnosis present

## 2021-04-20 LAB — POCT URINALYSIS DIPSTICK, ED / UC
Bilirubin Urine: NEGATIVE
Glucose, UA: NEGATIVE mg/dL
Hgb urine dipstick: NEGATIVE
Ketones, ur: NEGATIVE mg/dL
Nitrite: NEGATIVE
Protein, ur: NEGATIVE mg/dL
Specific Gravity, Urine: 1.015 (ref 1.005–1.030)
Urobilinogen, UA: 0.2 mg/dL (ref 0.0–1.0)
pH: 6 (ref 5.0–8.0)

## 2021-04-20 MED ORDER — NITROFURANTOIN MONOHYD MACRO 100 MG PO CAPS: 100.0000 mg | ORAL_CAPSULE | Freq: Two times a day (BID) | ORAL | 0 refills | Status: DC

## 2021-04-20 NOTE — ED Provider Notes (Signed)
[facility]    CSN: [id number] Arrival date & time: 04/20/21  1407      History   Chief Complaint Chief Complaint  Patient presents with   Abdominal Pain    HPI Carla Sloan is a 61 y.o. female.   Patient presents today with a 2-day history of lower abdominal pain.  She reports discomfort is rated 3/4 on a 0-10 pain scale, localized to lower abdomen, described as cramping, no aggravating relieving factors identified.  She is confident she is not pregnant as she is postmenopausal.  She is sexually active but has no specific concern for STI that she is open to testing.  She does report occasional vaginal discharge.  Denies any significant urinary symptoms including frequency, urgency, hematuria, flank pain, dysuria.  She has not tried any over-the-counter medication for symptom management.  Denies any history of gastrointestinal disorder.  Denies any associated fever, nausea, vomiting, diarrhea, melena, hematemesis.  In addition, patient reports a 1 day history of left leg pain.  Reports she was walking on the track at the gym when she had sudden severe left calf pain that caused her to stop.  This has been intermittent since that time and occurs at both rest and with activity.  Pain is rated 7 on a 0-10 pain scale, localized to left calf, described as aching/sharp, no aggravating relieving factors noted.  She has not tried any over-the-counter medication for symptom management.  She does report some associated swelling.  She denies any history of VTE event, malignancy, recent hospitalization, recent surgery, recent travel, recent immobilization.  She does not take exogenous hormones.  Does report she is recovered from COVID-19 infection within the past few months.  Denies any chest pain, shortness of breath, heart racing, lightheadedness.   Past Medical History:  Diagnosis Date   Arthritis    Depression    Hypertension    Muscle spasms of lower extremity    and  back   Sleep apnea     Patient Active Problem List   Diagnosis Date Noted   Morbid obesity ([place]) 04/24/2020    Past Surgical History:  Procedure Laterality Date   COLONOSCOPY WITH PROPOFOL N/A 09/24/2012   Procedure: COLONOSCOPY WITH PROPOFOL;  Surgeon: Jeryl Columbia, MD;  Location: [facility] ENDOSCOPY;  Service: Endoscopy;  Laterality: N/A;   COLONOSCOPY WITH PROPOFOL N/A 05/19/2016   Procedure: COLONOSCOPY WITH PROPOFOL;  Surgeon: Clarene Essex, MD;  Location: [facility] ENDOSCOPY;  Service: Endoscopy;  Laterality: N/A;   LAPAROSCOPIC GASTRIC SLEEVE RESECTION N/A 04/24/2020   Procedure: LAPAROSCOPIC GASTRIC SLEEVE RESECTION;  Surgeon: Clovis Riley, MD;  Location: [facility] ORS;  Service: General;  Laterality: N/A;   NO PAST SURGERIES     TUBAL LIGATION     UPPER GI ENDOSCOPY N/A 04/24/2020   Procedure: UPPER GI ENDOSCOPY;  Surgeon: Clovis Riley, MD;  Location: [facility] ORS;  Service: General;  Laterality: N/A;    OB History     Gravida  2   Para  2   Term  2   Preterm      AB      Living  2      SAB      IAB      Ectopic      Multiple      Live Births  2            Home Medications    Prior to Admission medications   Medication Sig Start Date End Date  Taking? Authorizing Provider  nitrofurantoin, macrocrystal-monohydrate, (MACROBID) 100 MG capsule Take 1 capsule (100 mg total) by mouth 2 (two) times daily. 04/20/21  Yes Eliberto Sole, Derry Skill, PA-C  acetaminophen (TYLENOL) 500 MG tablet Take 1 tablet (500 mg total) by mouth every 6 (six) hours as needed. 05/11/20   Hughie Closs, PA-C  amLODipine (NORVASC) 5 MG tablet Take 1 tablet (5 mg total) by mouth daily. 11/29/20 12/29/20  Eustaquio Maize, PA-C  azelastine (OPTIVAR) 0.05 % ophthalmic solution Place 1 drop into both eyes 2 (two) times daily.    [provider]  bimatoprost (LUMIGAN) 0.03 % ophthalmic solution Place 1 drop into both eyes at bedtime.    [provider]  brimonidine-timolol (COMBIGAN) 0.2-0.5 %  ophthalmic solution Place 1 drop into both eyes every 12 (twelve) hours.    [provider]  calcium citrate-vitamin D (CELEBRATE CALCIUM CITRATE) 500-500 MG-UNIT chewable tablet Chew 1 tablet by mouth every evening.    [provider]  CEQUA 0.09 % SOLN Place 1 drop into both eyes 2 (two) times daily. 11/17/19   [provider]  citalopram (CELEXA) 40 MG tablet Take 40 mg by mouth daily.    [provider]  cyclobenzaprine (FLEXERIL) 5 MG tablet Take 5 mg by mouth 3 (three) times daily. 06/20/13   [provider]  diclofenac Sodium (VOLTAREN) 1 % GEL Apply 4 g topically 4 (four) times daily. 08/02/20   Hazel Sams, PA-C  Elastic Bandages & Supports (WRIST SPLINT/COCK-UP/LEFT L) MISC Wear nightly and with exacerbating activities. 10/24/19   Patriciaann Clan, DO  Elastic Bandages & Supports (WRIST SPLINT/COCK-UP/RIGHT L) MISC Wear nightly and with exacerbating activities. 10/24/19   Patriciaann Clan, DO  ergocalciferol (VITAMIN D2) 1.25 MG (50000 UT) capsule Take 50,000 Units by mouth once a week.    [provider]  ferrous sulfate 324 MG TBEC Take 324 mg by mouth daily with breakfast.    [provider]  gabapentin (NEURONTIN) 800 MG tablet Take 800-1,600 mg by mouth 2 (two) times daily. Take 800 mg in the morning and 1600 mg at bedtime    [provider]  hydrochlorothiazide (HYDRODIURIL) 25 MG tablet Take 1 tablet (25 mg total) by mouth daily. 11/29/20 12/29/20  Eustaquio Maize, PA-C  hydrOXYzine (ATARAX/VISTARIL) 25 MG tablet Take 1-2 tablets (25-50 mg total) by mouth at bedtime as needed. Take for insomnia. Patient taking differently: Take 100 mg by mouth at bedtime. Take for insomnia. 05/21/19   Jaynee Eagles, PA-C  Lurasidone HCl (LATUDA) 60 MG TABS Take 60 mg by mouth at bedtime.    [provider]  Multiple Vitamins-Minerals (CELEBRATE MULTI-COMPLETE 53) CHEW Chew 1 each by mouth daily.    [provider]   ondansetron (ZOFRAN-ODT) 4 MG disintegrating tablet Take 1 tablet (4 mg total) by mouth every 6 (six) hours as needed for nausea or vomiting. 04/25/20   Clovis Riley, MD  pantoprazole (PROTONIX) 40 MG tablet Take 1 tablet (40 mg total) by mouth daily. 04/25/20   Clovis Riley, MD  pravastatin (PRAVACHOL) 20 MG tablet Take 20 mg by mouth daily. 12/02/19   [provider]  traMADol (ULTRAM) 50 MG tablet Take 1 tablet (50 mg total) by mouth every 6 (six) hours as needed (pain). 04/25/20   Clovis Riley, MD    Family History Family History  Problem Relation Age of Onset   Hypertension Mother    Diabetes Mother    Breast cancer Mother  Diabetes Father    Hypertension Father     Social History Social History   Tobacco Use   Smoking status: Never   Smokeless tobacco: Never  Vaping Use   Vaping Use: Never used  Substance Use Topics   Alcohol use: No   Drug use: No     Allergies   Patient has no known allergies.   Review of Systems Review of Systems  Constitutional:  Positive for activity change. Negative for appetite change, fatigue and fever.  Respiratory:  Negative for cough and shortness of breath.   Cardiovascular:  Negative for chest pain.  Gastrointestinal:  Positive for abdominal pain. Negative for diarrhea, nausea and vomiting.  Genitourinary:  Positive for vaginal discharge. Negative for dysuria, frequency, pelvic pain, urgency, vaginal bleeding and vaginal pain.  Musculoskeletal:  Positive for myalgias. Negative for arthralgias.  Neurological:  Negative for dizziness, light-headedness and headaches.    Physical Exam Triage Vital Signs ED Triage Vitals  Enc Vitals Group     BP 04/20/21 1556 122/82     Pulse Rate 04/20/21 1556 90     Resp 04/20/21 1556 20     Temp 04/20/21 1556 97.8 F (36.6 C)     Temp Source 04/20/21 1556 Oral     SpO2 04/20/21 1556 98 %     Weight --      Height --      Head Circumference --      Peak Flow --      Pain  Score 04/20/21 1554 7     Pain Loc --      Pain Edu? --      Excl. in [place]? --    No data found.  Updated Vital Signs BP 122/82 (BP Location: Left Arm) Comment (BP Location): large cuff   Pulse 90    Temp 97.8 F (36.6 C) (Oral)    Resp 20    LMP 09/25/2015 (Approximate)    SpO2 98%   Visual Acuity Right Eye Distance:   Left Eye Distance:   Bilateral Distance:    Right Eye Near:   Left Eye Near:    Bilateral Near:     Physical Exam Vitals reviewed.  Constitutional:      General: She is awake. She is not in acute distress.    Appearance: Normal appearance. She is well-developed. She is not ill-appearing.     Comments: Very pleasant female appears stated age in no acute distress sitting comfortably in exam room  HENT:     Head: Normocephalic and atraumatic.  Cardiovascular:     Rate and Rhythm: Normal rate and regular rhythm.     Heart sounds: Normal heart sounds, S1 normal and S2 normal. No murmur heard.    Comments: Nonpitting edema noted left lower extremity.  Negative Homans' sign on left. Pulmonary:     Effort: Pulmonary effort is normal.     Breath sounds: Normal breath sounds. No wheezing, rhonchi or rales.     Comments: Clear to auscultation bilaterally Abdominal:     General: Bowel sounds are normal.     Palpations: Abdomen is soft.     Tenderness: There is no abdominal tenderness. There is no right CVA tenderness, left CVA tenderness, guarding or rebound.     Comments: Benign abdominal exam  Genitourinary:    Comments: Exam deferred Musculoskeletal:     Right lower leg: No edema.     Left lower leg: Tenderness present. No deformity or bony tenderness. No edema.  Psychiatric:        Behavior: Behavior is cooperative.     UC Treatments / Results  Labs (all labs ordered are listed, but only abnormal results are displayed) Labs Reviewed  POCT URINALYSIS DIPSTICK, ED / UC - Abnormal; Notable for the following components:      Result Value   Leukocytes,Ua  LARGE (*)    All other components within normal limits  URINE CULTURE  CERVICOVAGINAL ANCILLARY ONLY    EKG   Radiology No results found.  Procedures Procedures (including critical care time)  Medications Ordered in UC Medications - No data to display  Initial Impression / Assessment and Plan / UC Course  I have reviewed the triage vital signs and the nursing notes.  Pertinent labs & imaging results that were available during my care of the patient were reviewed by me and considered in my medical decision making (see chart for details).     Vital signs of physical exam reassuring today; no indication for emergent evaluation or imaging.  UA showed large leukocyte Estrace also concern for UTI as etiology of symptoms.  Discussed potential utility of waiting until culture results are obtained to initiate antibiotics but given discomfort patient preferred to start medication empirically.  Macrobid sent to pharmacy but we discussed potential need to change antibiotics based on susceptibilities identified on culture.  STI swab collected-results pending.  We will arrange additional treatment based on results of this testing.  She was encouraged to rest and drink plenty of fluid.  Discussed alarm symptoms that warrant emergent evaluation.  Strict return precautions given to which she expressed understanding.  Swelling suspect muscular etiology, however, given swelling and recent COVID-19 infection will obtain ultrasound to rule out DVT.  Recommended conservative treatment including elevation, heat, NSAIDs for symptom relief.  We will make additional recommendations based on imaging results.  Discussed alarm symptoms that warrant emergent evaluation.  Strict return precautions given to which patient expressed understanding.  Work excuse note provided.  Final Clinical Impressions(s) / UC Diagnoses   Final diagnoses:  Lower abdominal pain  Abnormal urinalysis  Routine screening for STI  (sexually transmitted infection)  Left leg pain     Discharge Instructions      I am concerned that you might have a urinary tract infection since there were white blood cells in your urine today.  Please take Macrobid twice daily.  I am sending this off for culture and we will contact you if we need to change your treatment.  We will also contact you with your swab results if we need to arrange any treatment.  Make sure to rest and drink plenty of fluid.  If you have any additional symptoms including high fever, worsening abdominal pain, pelvic pain, nausea, vomiting you need to be seen immediately.  I believe that you have a muscle strain of your lower leg but do want to order an ultrasound to rule out a blood clot.  Please go to the emergency room as instructed for the ultrasound tomorrow.  In the meantime keep your leg elevated and use warmth.  You can use Tylenol and ibuprofen for pain relief.  If you develop any worsening symptoms including severe pain, chest pain, shortness of breath, heart racing you need to be seen immediately in the emergency room.     ED Prescriptions     Medication Sig Dispense Auth. Provider   nitrofurantoin, macrocrystal-monohydrate, (MACROBID) 100 MG capsule Take 1 capsule (100 mg total) by mouth 2 (two)  times daily. 10 capsule Manal Kreutzer, Derry Skill, PA-C      PDMP not reviewed this encounter.   Terrilee Croak, PA-C 04/20/21 1654

## 2021-04-20 NOTE — ED Triage Notes (Signed)
Lower abdominal pain onset 2 days ago.  Denies issues with urination Left leg pain started today and aches so badly she has to stop walking.

## 2021-04-20 NOTE — Discharge Instructions (Signed)
I am concerned that you might have a urinary tract infection since there were white blood cells in your urine today.  Please take Macrobid twice daily.  I am sending this off for culture and we will contact you if we need to change your treatment.  We will also contact you with your swab results if we need to arrange any treatment.  Make sure to rest and drink plenty of fluid.  If you have any additional symptoms including high fever, worsening abdominal pain, pelvic pain, nausea, vomiting you need to be seen immediately.  I believe that you have a muscle strain of your lower leg but do want to order an ultrasound to rule out a blood clot.  Please go to the emergency room as instructed for the ultrasound tomorrow.  In the meantime keep your leg elevated and use warmth.  You can use Tylenol and ibuprofen for pain relief.  If you develop any worsening symptoms including severe pain, chest pain, shortness of breath, heart racing you need to be seen immediately in the emergency room.

## 2021-04-21 ENCOUNTER — Encounter (HOSPITAL_COMMUNITY): Payer: Self-pay | Admitting: Emergency Medicine

## 2021-04-21 ENCOUNTER — Emergency Department (HOSPITAL_BASED_OUTPATIENT_CLINIC_OR_DEPARTMENT_OTHER)
Admission: RE | Admit: 2021-04-21 | Discharge: 2021-04-21 | Disposition: A | Discharge status: Home / Self Care | Payer: Medicare HMO | Source: Ambulatory Visit | Attending: Physician Assistant | Admitting: Physician Assistant

## 2021-04-21 ENCOUNTER — Emergency Department (HOSPITAL_COMMUNITY)
Admission: EM | Admit: 2021-04-21 | Discharge: 2021-04-21 | Disposition: A | Discharge status: Home / Self Care | Payer: Medicare HMO | Attending: Emergency Medicine | Admitting: Emergency Medicine

## 2021-04-21 ENCOUNTER — Other Ambulatory Visit: Payer: Self-pay

## 2021-04-21 DIAGNOSIS — I8290 Acute embolism and thrombosis of unspecified vein: Secondary | ICD-10-CM | POA: Diagnosis not present

## 2021-04-21 DIAGNOSIS — M79662 Pain in left lower leg: Secondary | ICD-10-CM | POA: Diagnosis not present

## 2021-04-21 DIAGNOSIS — I10 Essential (primary) hypertension: Secondary | ICD-10-CM | POA: Insufficient documentation

## 2021-04-21 DIAGNOSIS — Z79899 Other long term (current) drug therapy: Secondary | ICD-10-CM | POA: Diagnosis not present

## 2021-04-21 LAB — URINE CULTURE

## 2021-04-21 MED ORDER — RIVAROXABAN (XARELTO) VTE STARTER PACK (15 & 20 MG): ORAL_TABLET | ORAL | 0 refills | Status: DC

## 2021-04-21 NOTE — Discharge Instructions (Signed)
Please return to the ED with any new symptoms such as increased leg pain Please follow-up with your PCP this week.  Please call tomorrow, Monday, to make an appointment to be evaluated this week. Please take this medication with food to help absorption Please have extreme caution while take this medication.  As discussed, this medication makes you more susceptible to bleeding.  If you were to fall and hit your head, be involved in a car accident or any other kind of trauma you will need to present immediately to the ED for evaluation. Please pick up the medication, Xarelto, I prescribed you.  You will need to begin taking this medication immediately.

## 2021-04-21 NOTE — ED Notes (Signed)
Provider at bedside

## 2021-04-21 NOTE — Progress Notes (Signed)
VASCULAR LAB    Left lower extremity venous duplex has been performed.  See CV proc for preliminary results.   Quiara Killian, RVT 04/21/2021, 10:55 AM

## 2021-04-21 NOTE — ED Triage Notes (Signed)
Pt seen at [facility] yesterday for L lower leg pain that started yesterday.  States pain was only with walking.  Pt had outpatient vascular US this morning and + for DVT.  Denies leg pain at present. Denies SOB.

## 2021-04-21 NOTE — ED Notes (Signed)
Pt ambulatory to restroom independently.

## 2021-04-21 NOTE — ED Provider Notes (Signed)
[facility] EMERGENCY DEPARTMENT Provider Note   CSN: [id number] Arrival date & time: 04/21/21  1100     History  Chief Complaint  Patient presents with   DVT    Carla Sloan is a 61 y.o. female with medical history of depression, hypertension, arthritis.  Patient presents to ED for evaluation of positive DVT ultrasound study that she received as an outpatient.  Patient states that beginning yesterday, she was in her usual state of health at the gym exercising when she had sudden onset left lower extremity pain.  Patient states that this pain caused her to immediately cease exercising.  Patient states that at this time she sought care at urgent care who scheduled her to have an outpatient DVT study this morning.  Patient DVT study was indicative of acute venous thromboembolism of the left soleal vein, left gastrocnemius vein.  Patient advised to report to ED for further evaluation and treatment.  Patient endorsing left leg pain.  Patient denies chest pain, shortness of breath, headache, fevers. HPI     Home Medications Prior to Admission medications   Medication Sig Start Date End Date Taking? Authorizing Provider  RIVAROXABAN Alveda Reasons) VTE STARTER PACK (15 & 20 MG) Follow package directions: Take one 15mg  tablet by mouth twice a day. On day 22, switch to one 20mg  tablet once a day. Take with food. 04/21/21  Yes Azucena Cecil, PA-C  acetaminophen (TYLENOL) 500 MG tablet Take 1 tablet (500 mg total) by mouth every 6 (six) hours as needed. 05/11/20   Hughie Closs, PA-C  amLODipine (NORVASC) 5 MG tablet Take 1 tablet (5 mg total) by mouth daily. 11/29/20 12/29/20  Eustaquio Maize, PA-C  azelastine (OPTIVAR) 0.05 % ophthalmic solution Place 1 drop into both eyes 2 (two) times daily.    [provider]  bimatoprost (LUMIGAN) 0.03 % ophthalmic solution Place 1 drop into both eyes at bedtime.    [provider]  brimonidine-timolol  (COMBIGAN) 0.2-0.5 % ophthalmic solution Place 1 drop into both eyes every 12 (twelve) hours.    [provider]  calcium citrate-vitamin D (CELEBRATE CALCIUM CITRATE) 500-500 MG-UNIT chewable tablet Chew 1 tablet by mouth every evening.    [provider]  CEQUA 0.09 % SOLN Place 1 drop into both eyes 2 (two) times daily. 11/17/19   [provider]  citalopram (CELEXA) 40 MG tablet Take 40 mg by mouth daily.    [provider]  cyclobenzaprine (FLEXERIL) 5 MG tablet Take 5 mg by mouth 3 (three) times daily. 06/20/13   [provider]  diclofenac Sodium (VOLTAREN) 1 % GEL Apply 4 g topically 4 (four) times daily. 08/02/20   Hazel Sams, PA-C  Elastic Bandages & Supports (WRIST SPLINT/COCK-UP/LEFT L) MISC Wear nightly and with exacerbating activities. 10/24/19   Patriciaann Clan, DO  Elastic Bandages & Supports (WRIST SPLINT/COCK-UP/RIGHT L) MISC Wear nightly and with exacerbating activities. 10/24/19   Patriciaann Clan, DO  ergocalciferol (VITAMIN D2) 1.25 MG (50000 UT) capsule Take 50,000 Units by mouth once a week.    [provider]  ferrous sulfate 324 MG TBEC Take 324 mg by mouth daily with breakfast.    [provider]  gabapentin (NEURONTIN) 800 MG tablet Take 800-1,600 mg by mouth 2 (two) times daily. Take 800 mg in the morning and 1600 mg at bedtime    [provider]  hydrochlorothiazide (HYDRODIURIL) 25 MG tablet Take 1 tablet (25 mg total) by mouth daily.  11/29/20 12/29/20  Eustaquio Maize, PA-C  hydrOXYzine (ATARAX/VISTARIL) 25 MG tablet Take 1-2 tablets (25-50 mg total) by mouth at bedtime as needed. Take for insomnia. Patient taking differently: Take 100 mg by mouth at bedtime. Take for insomnia. 05/21/19   Jaynee Eagles, PA-C  Lurasidone HCl (LATUDA) 60 MG TABS Take 60 mg by mouth at bedtime.    [provider]  Multiple Vitamins-Minerals (CELEBRATE MULTI-COMPLETE 19) CHEW Chew 1 each by mouth daily.     [provider]  nitrofurantoin, macrocrystal-monohydrate, (MACROBID) 100 MG capsule Take 1 capsule (100 mg total) by mouth 2 (two) times daily. 04/20/21   Raspet, Erin K, PA-C  ondansetron (ZOFRAN-ODT) 4 MG disintegrating tablet Take 1 tablet (4 mg total) by mouth every 6 (six) hours as needed for nausea or vomiting. 04/25/20   Clovis Riley, MD  pantoprazole (PROTONIX) 40 MG tablet Take 1 tablet (40 mg total) by mouth daily. 04/25/20   Clovis Riley, MD  pravastatin (PRAVACHOL) 20 MG tablet Take 20 mg by mouth daily. 12/02/19   [provider]  traMADol (ULTRAM) 50 MG tablet Take 1 tablet (50 mg total) by mouth every 6 (six) hours as needed (pain). 04/25/20   Clovis Riley, MD      Allergies    Patient has no known allergies.    Review of Systems   Review of Systems  Constitutional:  Negative for fever.  Respiratory:  Negative for shortness of breath.   Cardiovascular:  Positive for leg swelling. Negative for chest pain.  Musculoskeletal:  Positive for myalgias.  Neurological:  Negative for headaches.  All other systems reviewed and are negative.  Physical Exam Updated Vital Signs BP 130/77    Pulse (!) 59    Temp 97.7 F (36.5 C) (Oral)    Resp 16    LMP 09/25/2015 (Approximate)    SpO2 100%  Physical Exam Vitals and nursing note reviewed.  Constitutional:      General: She is not in acute distress.    Appearance: She is not ill-appearing, toxic-appearing or diaphoretic.  HENT:     Head: Normocephalic and atraumatic.     Nose: Nose normal.     Mouth/Throat:     Mouth: Mucous membranes are moist.  Eyes:     Extraocular Movements: Extraocular movements intact.     Conjunctiva/sclera: Conjunctivae normal.     Pupils: Pupils are equal, round, and reactive to light.  Cardiovascular:     Rate and Rhythm: Normal rate and regular rhythm.  Pulmonary:     Effort: Pulmonary effort is normal.     Breath sounds: Normal breath sounds. No wheezing.  Abdominal:      General: Abdomen is flat.     Palpations: Abdomen is soft.     Tenderness: There is no abdominal tenderness.  Musculoskeletal:     Cervical back: Normal range of motion and neck supple. No tenderness.     Right lower leg: Normal.     Left lower leg: Swelling and tenderness present.     Comments: Mild tenderness to palpation of patient's left calf  Skin:    General: Skin is warm and dry.     Capillary Refill: Capillary refill takes less than 2 seconds.  Neurological:     Mental Status: She is alert and oriented to person, place, and time.    ED Results / Procedures / Treatments   Labs (all labs ordered are listed, but only abnormal results are displayed) Labs Reviewed - No  data to display  EKG None  Radiology LE VENOUS  Result Date: 04/21/2021  Lower Venous DVT Study Patient Name:  [facility]  Date of Exam:   04/21/2021 Medical Rec #: [id number]                 Accession #:    [id number] Date of Birth: May 30, 1960                 Patient Gender: F Patient Age:   65 years Exam Location:  [facility] Procedure:      VAS [place] LOWER EXTREMITY VENOUS (DVT) Referring Phys: Junie Panning RASPET --------------------------------------------------------------------------------  Indications: Posterior calf pain.  Comparison Study: No prior left LEV on file Performing Technologist: Sharion Dove RVS  Examination Guidelines: A complete evaluation includes B-mode imaging, spectral Doppler, color Doppler, and power Doppler as needed of all accessible portions of each vessel. Bilateral testing is considered an integral part of a complete examination. Limited examinations for reoccurring indications may be performed as noted. The reflux portion of the exam is performed with the patient in reverse Trendelenburg.  +-----+---------------+---------+-----------+----------+--------------+  RIGHT Compressibility Phasicity Spontaneity Properties Thrombus Aging   +-----+---------------+---------+-----------+----------+--------------+  CFV   Full            Yes       Yes                                    +-----+---------------+---------+-----------+----------+--------------+   +---------+---------------+---------+-----------+----------+--------------+  LEFT      Compressibility Phasicity Spontaneity Properties Thrombus Aging  +---------+---------------+---------+-----------+----------+--------------+  CFV       Full            Yes       Yes                                    +---------+---------------+---------+-----------+----------+--------------+  SFJ       Full                                                             +---------+---------------+---------+-----------+----------+--------------+  FV Prox   Full                                                             +---------+---------------+---------+-----------+----------+--------------+  FV Mid    Full                                                             +---------+---------------+---------+-----------+----------+--------------+  FV Distal Full                                                             +---------+---------------+---------+-----------+----------+--------------+  PFV       Full                                                             +---------+---------------+---------+-----------+----------+--------------+  POP       Full            Yes       Yes                                    +---------+---------------+---------+-----------+----------+--------------+  PTV       Full                                                             +---------+---------------+---------+-----------+----------+--------------+  PERO      Full                                                             +---------+---------------+---------+-----------+----------+--------------+  Soleal    None            No        No                     Acute            +---------+---------------+---------+-----------+----------+--------------+  Gastroc   None            No        No                     Acute           +---------+---------------+---------+-----------+----------+--------------+     Summary: RIGHT: - No evidence of common femoral vein obstruction.  LEFT: - Findings consistent with acute deep vein thrombosis involving the left soleal veins, and left gastrocnemius veins.  *See table(s) above for measurements and observations. Electronically signed by Monica Martinez MD on 04/21/2021 at 12:10:51 PM.    Final     Procedures Procedures   Medications Ordered in ED Medications - No data to display  ED Course/ Medical Decision Making/ A&P                           Medical Decision Making Risk Prescription drug management.   61 year old female presents to ED for further management of positive DVT study that she underwent as an outpatient. According to DVT study results, patient has positive acute DVT of left gastrocnemius veins, left peroneal veins. On examination, the patient is afebrile, nontachycardic, nonhypoxic, clear lung sounds bilaterally, soft possible abdomen.  The patient does have a tender left lower extremity near the calf, there is no overlying erythema or ecchymosis.  The patient has 2+ DP pulse in her left foot, she has less than 2-second capillary refill of the left foot.  Patient recently had  labs drawn on 2/6 that showed a GFR greater than 60 as well as a creatinine that was within normal limits.  Based on positive DVT ultrasound study, patient should be placed on 3 months of anticoagulation.  I will start this patient on Xarelto starter pack here today, and advised her to follow-up with her PCP for further management.  I have provided the patient with return precautions and she voiced understanding.  I have advised the patient that while on anticoagulation therapy she is at extreme risk for bleeding and if she were to suffer any trauma  she needs to report immediately to the ED for further evaluation.  The patient voiced understanding with this instruction.  The patient all her questions answered to her satisfaction.  The patient is stable for discharge at this time.   Final Clinical Impression(s) / ED Diagnoses Final diagnoses:  Venous thromboembolism    Rx / DC Orders ED Discharge Orders          Ordered    RIVAROXABAN (XARELTO) VTE STARTER PACK (15 & 20 MG)       Note to Pharmacy: 15mg  BID for 21 days and then 20mg  QD for 59 days (3 months total of treatment)   04/21/21 1235              Lawana Chambers 04/21/21 1242    Luna Fuse, MD 04/21/21 [phone]

## 2021-04-22 LAB — CERVICOVAGINAL ANCILLARY ONLY
Bacterial Vaginitis (gardnerella): POSITIVE — AB
Candida Glabrata: NEGATIVE
Candida Vaginitis: NEGATIVE
Chlamydia: NEGATIVE
Comment: NEGATIVE
Comment: NEGATIVE
Comment: NEGATIVE
Comment: NEGATIVE
Comment: NEGATIVE
Comment: NORMAL
Neisseria Gonorrhea: NEGATIVE
Trichomonas: POSITIVE — AB

## 2021-04-23 ENCOUNTER — Telehealth (HOSPITAL_COMMUNITY): Payer: Self-pay | Admitting: Emergency Medicine

## 2021-04-23 MED ORDER — METRONIDAZOLE 500 MG PO TABS: 500.0000 mg | ORAL_TABLET | Freq: Two times a day (BID) | ORAL | 0 refills | Status: DC

## 2021-05-06 ENCOUNTER — Other Ambulatory Visit: Payer: Self-pay

## 2021-05-06 ENCOUNTER — Encounter: Payer: Medicare HMO | Attending: Surgery | Admitting: Skilled Nursing Facility1

## 2021-05-06 DIAGNOSIS — Z713 Dietary counseling and surveillance: Secondary | ICD-10-CM | POA: Diagnosis not present

## 2021-05-06 DIAGNOSIS — Z6841 Body Mass Index (BMI) 40.0 and over, adult: Secondary | ICD-10-CM | POA: Diagnosis not present

## 2021-05-06 NOTE — Progress Notes (Signed)
Follow-up visit:  Post-Operative sleeve Surgery ? ? ?Anthropometrics  ?Surgery date: 04/24/2020 ?Surgery type: sleeve ?Start weight at [facility]: 411.3 pounds ?Weight today: 255 pounds ?  ?Body Composition Scale 05/08/2020 06/19/2020 12/03/2020 03/05/2021 05/06/2021  ?Current Body Weight 356.7 336.2 271.3 257.7 255.1  ?Total Body Fat % 52.6 51.6 47.5 46.4 46.1  ?Visceral Fat '28 26 19 18 18  '$ ?Fat-Free Mass % 47.3 48.3 52.4 53.5 53.8  ? Total Body Water % 38.1 38.6 40.7 41.2 41.4  ?Muscle-Mass lbs 32.5 32.3 31.5 31.4 31.4  ?BMI 57.6 54.3 43.8 41.5 41.1  ?Body Fat Displacement       ?       Torso  lbs 116.5 107.8 80 74.1 73  ?       Left Leg  lbs 23.3 21.5 16 14.8 14.6  ?       Right Leg  lbs 23.3 21.5 16 14.8 14.6  ?       Left Arm  lbs 11.6 10.7 8 7.4 7.3  ?       Right Arm   lbs 11.6 10.7 8 7.4 7.3  ? ?Pt states she has significantly more energy and moves much better.  ?Pt states she got a blood clot in her leg so she started a blood thinner.  ?Pt states she did switch to brown rice.  ?Pt states she has working out as a hobby now.  ? ?24 hr recall: ?Breakfast: 1 pack grits + italian sausage  ?Snack: protein shake ?Lunch: stewed potatoes + sausage ?Snack: grapefruit ?Dinner: collard greens + sausage + stewed potatoes  ? ?Fluid intake: water 60-80 fluid ounces, sugar free jello, sometimes unsweet tea, sometimes orange juice, cranberry juice, water + flavorings ? ?Medications: See List ?Supplementation: bari multi and calcium   ? ? ?Using straws: no ?Drinking while eating: no ?Having you been chewing well: yes ?Chewing/swallowing difficulties: no ?Changes in vision: no ?Changes to mood/headaches: no ?Hair loss/Cahnges to skin/Changes to nails: no ?Any difficulty focusing or concentrating: no ?Sweating: no ?Dizziness/Lightheaded: no ?Palpitations: no  ?Carbonated beverages: no ?N/V/D/C/GAS: no ?Abdominal Pain: no ?Dumping syndrome: no ? ?Recent physical activity:  Speed walking 1.5 miles 6 days a week, some weight machines  [facility]  ? ?Progress Towards Goal(s):  In Progress ?Teaching method utilized: Visual & Auditory  ?Demonstrated degree of understanding via: Teach Back  ?Readiness Level: Action ?Barriers to learning/adherence to lifestyle change: none identified ? ?Goals: ?-call your surgeons office for your one year appt ?-add in more non starchy vegetables ? ?Teaching Method Utilized:  ?Visual ?Auditory ?Hands on ? ?Demonstrated degree of understanding via:  Teach Back  ? ?Monitoring/Evaluation:  Dietary intake, exercise, and body weight.  ?

## 2021-05-18 ENCOUNTER — Encounter (HOSPITAL_COMMUNITY): Payer: Self-pay | Admitting: *Deleted

## 2021-05-18 ENCOUNTER — Emergency Department (HOSPITAL_COMMUNITY): Payer: Medicare HMO

## 2021-05-18 ENCOUNTER — Emergency Department (HOSPITAL_BASED_OUTPATIENT_CLINIC_OR_DEPARTMENT_OTHER): Payer: Medicare HMO

## 2021-05-18 ENCOUNTER — Emergency Department (HOSPITAL_COMMUNITY)
Admission: EM | Admit: 2021-05-18 | Discharge: 2021-05-18 | Disposition: A | Discharge status: Home / Self Care | Payer: Medicare HMO | Attending: Emergency Medicine | Admitting: Emergency Medicine

## 2021-05-18 ENCOUNTER — Other Ambulatory Visit: Payer: Self-pay

## 2021-05-18 DIAGNOSIS — Z79899 Other long term (current) drug therapy: Secondary | ICD-10-CM | POA: Diagnosis not present

## 2021-05-18 DIAGNOSIS — M25561 Pain in right knee: Secondary | ICD-10-CM

## 2021-05-18 DIAGNOSIS — I10 Essential (primary) hypertension: Secondary | ICD-10-CM | POA: Diagnosis not present

## 2021-05-18 DIAGNOSIS — M1711 Unilateral primary osteoarthritis, right knee: Secondary | ICD-10-CM | POA: Insufficient documentation

## 2021-05-18 DIAGNOSIS — Z7901 Long term (current) use of anticoagulants: Secondary | ICD-10-CM | POA: Insufficient documentation

## 2021-05-18 MED ORDER — ACETAMINOPHEN 325 MG PO TABS
650.0000 mg | ORAL_TABLET | Freq: Once | ORAL | Status: AC
  Administered 2021-05-18: 650 mg via ORAL
  Filled 2021-05-18: qty 2

## 2021-05-18 NOTE — Progress Notes (Signed)
Orthopedic Tech Progress Note ?Patient Details:  ?Carla Sloan ?[birth year] ?[id number] ? ?Ortho Devices ?Type of Ortho Device: Crutches, Knee Immobilizer ?Ortho Device/Splint Location: Right knee ?Ortho Device/Splint Interventions: Application, Adjustment ?  ?Post Interventions ?Patient Tolerated: Well ? ?Carla Sloan ?05/18/2021, 9:59 AM ? ?

## 2021-05-18 NOTE — ED Triage Notes (Signed)
Patient c/o rght knee pain onset 1 week ago states the pain is off and on, denies injury. States she has a history of DVT in left leg.  ?

## 2021-05-18 NOTE — ED Provider Triage Note (Signed)
Emergency Medicine Provider Triage Evaluation Note ? ?Carla Sloan , a 61 y.o. female  was evaluated in triage.  Pt complains of right knee pain x 1 week. Located posterior knee. Recent dx of DVT to LLE. On anticoagulation. Compliant with home meds. No recent injury or trauma. No CP, SOB. No swelling, redness, warmth ?Review of Systems  ?Positive: Right knee pain ?Negative:  ? ?Physical Exam  ?BP (!) 142/82 (BP Location: Right Arm)   Pulse 75   Temp 98.2 ?F (36.8 ?C) (Oral)   Resp 18   LMP 09/25/2015 (Approximate)   SpO2 100%  ?Gen:   Awake, no distress   ?Resp:  Normal effort  ?MSK:   Moves extremities without difficulty  ?Other:   ? ?Medical Decision Making  ?Medically screening exam initiated at 6:35 AM.  Appropriate orders placed.  Carla Sloan was informed that the remainder of the evaluation will be completed by another provider, this initial triage assessment does not replace that evaluation, and the importance of remaining in the ED until their evaluation is complete. ? ?Right knee pain ?  ?Ariana Cavenaugh A, PA-C ?05/18/21 0636 ? ?

## 2021-05-18 NOTE — Progress Notes (Signed)
RLE venous duplex has been completed.  Preliminary results given to Dr. Tomi Bamberger vis secure messenger. ? ?Results can be found under chart review under CV PROC. ?05/18/2021 9:26 AM ?Sterling Ucci RVT, RDMS ? ?

## 2021-05-18 NOTE — ED Provider Notes (Signed)
?[facility] ?Provider Note ? ? ?CSN: [id number] ?Arrival date & time: 05/18/21  0786 ? ?  ? ?History ? ?Chief Complaint  ?Patient presents with  ? Knee Pain  ? ? ?Carla Sloan is a 61 y.o. female who has a PMHx of arthritis, HTN, who presents to the emergency department complaining of right knee pain onset 1 week.  She notes that her right knee pain has been intermittent.  Denies recent injury, trauma, fall. Patient notes that she has been compliant with her anticoagulant.  Pt notes that she has an orthopedist at this time, who noted that she would need bilateral knee replacements in the future, however, pt is attempting to lose weight prior to the operation. Denies chest pain, shortness of breath, palpitations, swelling, color change, increased warmth. ? ?Per patient chart review: Patient was evaluated in the ED on 04/21/2021 status post DVT ultrasound that was positive for DVT to the left lower extremity.  At that time she was started on rivaroxaban.  ? ? ?The history is provided by the patient. No language interpreter was used.  ? ?  ? ?Home Medications ?Prior to Admission medications   ?Medication Sig Start Date End Date Taking? Authorizing Provider  ?acetaminophen (TYLENOL) 500 MG tablet Take 1 tablet (500 mg total) by mouth every 6 (six) hours as needed. 05/11/20   Hughie Closs, PA-C  ?amLODipine (NORVASC) 5 MG tablet Take 1 tablet (5 mg total) by mouth daily. 11/29/20 12/29/20  Eustaquio Maize, PA-C  ?azelastine (OPTIVAR) 0.05 % ophthalmic solution Place 1 drop into both eyes 2 (two) times daily.    [provider]  ?bimatoprost (LUMIGAN) 0.03 % ophthalmic solution Place 1 drop into both eyes at bedtime.    [provider]  ?brimonidine-timolol (COMBIGAN) 0.2-0.5 % ophthalmic solution Place 1 drop into both eyes every 12 (twelve) hours.    [provider]  ?calcium citrate-vitamin D (CELEBRATE CALCIUM CITRATE) 500-500 MG-UNIT  chewable tablet Chew 1 tablet by mouth every evening.    [provider]  ?CEQUA 0.09 % SOLN Place 1 drop into both eyes 2 (two) times daily. 11/17/19   [provider]  ?citalopram (CELEXA) 40 MG tablet Take 40 mg by mouth daily.    [provider]  ?cyclobenzaprine (FLEXERIL) 5 MG tablet Take 5 mg by mouth 3 (three) times daily. 06/20/13   [provider]  ?diclofenac Sodium (VOLTAREN) 1 % GEL Apply 4 g topically 4 (four) times daily. 08/02/20   Hazel Sams, PA-C  ?Elastic Bandages & Supports (WRIST SPLINT/COCK-UP/LEFT L) MISC Wear nightly and with exacerbating activities. 10/24/19   Patriciaann Clan, DO  ?Elastic Bandages & Supports (WRIST SPLINT/COCK-UP/RIGHT L) MISC Wear nightly and with exacerbating activities. 10/24/19   Patriciaann Clan, DO  ?ergocalciferol (VITAMIN D2) 1.25 MG (50000 UT) capsule Take 50,000 Units by mouth once a week.    [provider]  ?ferrous sulfate 324 MG TBEC Take 324 mg by mouth daily with breakfast.    [provider]  ?gabapentin (NEURONTIN) 800 MG tablet Take 800-1,600 mg by mouth 2 (two) times daily. Take 800 mg in the morning and 1600 mg at bedtime    [provider]  ?hydrochlorothiazide (HYDRODIURIL) 25 MG tablet Take 1 tablet (25 mg total) by mouth daily. 11/29/20 12/29/20  Eustaquio Maize, PA-C  ?hydrOXYzine (ATARAX/VISTARIL) 25 MG tablet Take 1-2 tablets (25-50 mg total) by mouth at bedtime as needed. Take for insomnia. ?Patient taking differently: Take  100 mg by mouth at bedtime. Take for insomnia. 05/21/19   Jaynee Eagles, PA-C  ?Lurasidone HCl (LATUDA) 60 MG TABS Take 60 mg by mouth at bedtime.    [provider]  ?metroNIDAZOLE (FLAGYL) 500 MG tablet Take 1 tablet (500 mg total) by mouth 2 (two) times daily. 04/23/21   Lamptey, Myrene Galas, MD  ?Multiple Vitamins-Minerals (CELEBRATE MULTI-COMPLETE 80) CHEW Chew 1 each by mouth daily.    [provider]  ?nitrofurantoin, macrocrystal-monohydrate,  (MACROBID) 100 MG capsule Take 1 capsule (100 mg total) by mouth 2 (two) times daily. 04/20/21   Raspet, Derry Skill, PA-C  ?ondansetron (ZOFRAN-ODT) 4 MG disintegrating tablet Take 1 tablet (4 mg total) by mouth every 6 (six) hours as needed for nausea or vomiting. 04/25/20   Clovis Riley, MD  ?pantoprazole (PROTONIX) 40 MG tablet Take 1 tablet (40 mg total) by mouth daily. 04/25/20   Clovis Riley, MD  ?pravastatin (PRAVACHOL) 20 MG tablet Take 20 mg by mouth daily. 12/02/19   [provider]  ?RIVAROXABAN Alveda Reasons) VTE STARTER PACK (15 & 20 MG) Follow package directions: Take one '15mg'$  tablet by mouth twice a day. On day 22, switch to one '20mg'$  tablet once a day. Take with food. 04/21/21   Azucena Cecil, PA-C  ?traMADol (ULTRAM) 50 MG tablet Take 1 tablet (50 mg total) by mouth every 6 (six) hours as needed (pain). 04/25/20   Clovis Riley, MD  ?   ? ?Allergies    ?Patient has no known allergies.   ? ?Review of Systems   ?Review of Systems  ?Constitutional:  Negative for chills and fever.  ?Musculoskeletal:  Positive for arthralgias and joint swelling. Negative for gait problem.  ?Skin:  Negative for color change, rash and wound.  ?All other systems reviewed and are negative. ? ?Physical Exam ?Updated Vital Signs ?BP (!) 142/82 (BP Location: Right Arm)   Pulse 75   Temp 98.2 ?F (36.8 ?C) (Oral)   Resp 18   Ht '5\' 6"'$  (1.676 m)   Wt 114.8 kg   LMP 09/25/2015 (Approximate)   SpO2 100%   BMI 40.84 kg/m?  ?Physical Exam ?Vitals and nursing note reviewed.  ?Constitutional:   ?   General: She is not in acute distress. ?   Appearance: Normal appearance.  ?Eyes:  ?   General: No scleral icterus. ?   Extraocular Movements: Extraocular movements intact.  ?Cardiovascular:  ?   Rate and Rhythm: Normal rate.  ?Pulmonary:  ?   Effort: Pulmonary effort is normal. No respiratory distress.  ?Musculoskeletal:  ?   Cervical back: Neck supple.  ?   Right knee: No swelling, deformity, effusion, erythema,  ecchymosis, lacerations or bony tenderness. Decreased range of motion (secondary to pain). No tenderness.  ?   Right lower leg: Normal.  ?   Left lower leg: Normal.  ?   Comments: No tenderness to palpation to right knee. No obvious deformity, effusion, erythema, or swelling.  Decreased flexion of right knee secondary to pain.  Patient able to flex knee to approximately 20 degrees flexion.  No calf tenderness.  Pedal pulses intact bilaterally. No increased warmth to joint. Pt able to ambulate without assistance or difficulty.  ?Skin: ?   General: Skin is warm and dry.  ?   Findings: No bruising, erythema or rash.  ?Neurological:  ?   Mental Status: She is alert.  ?Psychiatric:     ?   Behavior: Behavior normal.  ? ? ?ED  Results / Procedures / Treatments   ?Labs ?(all labs ordered are listed, but only abnormal results are displayed) ?Labs Reviewed - No data to display ? ?EKG ?None ? ?Radiology ?DG Knee Complete 4 Views Right ? ?Result Date: 05/18/2021 ?CLINICAL DATA:  61 year old female with history of right-sided knee pain. EXAM: RIGHT KNEE - COMPLETE 4+ VIEW COMPARISON:  No priors. FINDINGS: Four views of the right knee demonstrate no acute displaced fracture, subluxation or dislocation. There is joint space narrowing, subchondral sclerosis, subchondral cyst formation and osteophyte formation most notable in the lateral and patellofemoral compartments, compatible with severe osteoarthritis. IMPRESSION: 1. No acute radiographic abnormality of the right knee. 2. Severe tricompartmental osteoarthritis, most pronounced in the lateral and patellofemoral compartments. Electronically Signed   By: Vinnie Langton M.D.   On: 05/18/2021 07:25   ? ?Procedures ?Procedures  ? ? ?Medications Ordered in ED ?Medications  ?acetaminophen (TYLENOL) tablet 650 mg (650 mg Oral Given 05/18/21 0932)  ? ? ?ED Course/ Medical Decision Making/ A&P ?Clinical Course as of 05/18/21 1027  ?Sat May 18, 2021  ?0801 Discussed with patient imaging  findings.  Patient notes that she does have an orthopedist, Dr. Paralee Cancel.  Discussed discharge treatment plan with patient at bedside.  Patient agreeable at this time.  Patient for safe discharge. [SB]  ?  ?Clinical Course

## 2021-05-18 NOTE — Discharge Instructions (Addendum)
It was a pleasure taking care of you today!  ? ?Your xray was notable for arthritis to your right knee. Your ultrasound was negative for a blood clot to your right leg. Attached is information for the on-call Orthopedist, call and set up a follow up appointment regarding todays ED visit. You may follow up with your primary care provider as needed. Return to the Emergency Department if you are experiencing increasing/worsening knee pain, swelling, redness, or worsening symptoms. ?

## 2021-07-05 ENCOUNTER — Encounter (HOSPITAL_COMMUNITY): Payer: Self-pay

## 2021-07-05 ENCOUNTER — Other Ambulatory Visit: Payer: Self-pay

## 2021-07-05 ENCOUNTER — Emergency Department (HOSPITAL_COMMUNITY)
Admission: EM | Admit: 2021-07-05 | Discharge: 2021-07-06 | Disposition: A | Discharge status: Home / Self Care | Payer: Medicare HMO | Attending: Emergency Medicine | Admitting: Emergency Medicine

## 2021-07-05 ENCOUNTER — Emergency Department (HOSPITAL_COMMUNITY): Payer: Medicare HMO

## 2021-07-05 DIAGNOSIS — M79601 Pain in right arm: Secondary | ICD-10-CM | POA: Diagnosis present

## 2021-07-05 DIAGNOSIS — M79602 Pain in left arm: Secondary | ICD-10-CM | POA: Insufficient documentation

## 2021-07-05 DIAGNOSIS — Z79899 Other long term (current) drug therapy: Secondary | ICD-10-CM | POA: Diagnosis not present

## 2021-07-05 DIAGNOSIS — I10 Essential (primary) hypertension: Secondary | ICD-10-CM | POA: Insufficient documentation

## 2021-07-05 DIAGNOSIS — R739 Hyperglycemia, unspecified: Secondary | ICD-10-CM | POA: Insufficient documentation

## 2021-07-05 LAB — CBC WITH DIFFERENTIAL/PLATELET
Abs Immature Granulocytes: 0.01 10*3/uL (ref 0.00–0.07)
Basophils Absolute: 0 10*3/uL (ref 0.0–0.1)
Basophils Relative: 1 %
Eosinophils Absolute: 0.1 10*3/uL (ref 0.0–0.5)
Eosinophils Relative: 1 %
HCT: 37.2 % (ref 36.0–46.0)
Hemoglobin: 11.7 g/dL — ABNORMAL LOW (ref 12.0–15.0)
Immature Granulocytes: 0 %
Lymphocytes Relative: 42 %
Lymphs Abs: 3.4 10*3/uL (ref 0.7–4.0)
MCH: 29.5 pg (ref 26.0–34.0)
MCHC: 31.5 g/dL (ref 30.0–36.0)
MCV: 93.7 fL (ref 80.0–100.0)
Monocytes Absolute: 0.7 10*3/uL (ref 0.1–1.0)
Monocytes Relative: 8 %
Neutro Abs: 3.8 10*3/uL (ref 1.7–7.7)
Neutrophils Relative %: 48 %
Platelets: 277 10*3/uL (ref 150–400)
RBC: 3.97 MIL/uL (ref 3.87–5.11)
RDW: 13.5 % (ref 11.5–15.5)
WBC: 8 10*3/uL (ref 4.0–10.5)
nRBC: 0 % (ref 0.0–0.2)

## 2021-07-05 LAB — BASIC METABOLIC PANEL
Anion gap: 6 (ref 5–15)
BUN: 16 mg/dL (ref 6–20)
CO2: 28 mmol/L (ref 22–32)
Calcium: 10.1 mg/dL (ref 8.9–10.3)
Chloride: 105 mmol/L (ref 98–111)
Creatinine, Ser: 0.65 mg/dL (ref 0.44–1.00)
GFR, Estimated: >60 mL/min (ref >60)
Glucose, Bld: 112 mg/dL — ABNORMAL HIGH (ref 70–99)
Potassium: 4.3 mmol/L (ref 3.5–5.1)
Sodium: 139 mmol/L (ref 135–145)

## 2021-07-05 NOTE — ED Triage Notes (Signed)
Pt c/o bump on right and left arms. Pt has a bump the size of a dime on left forearm with ecchymosis surrounding it. Pt has a bump the size of a dime on upper anterior of right forearm and has ecchymosis surrounding it. Pt denies injuring arms. Pt c/o lightheaded and dizzinessx5 days, the same time the bumps and ecchymosis arrived on arms. Pt c/o cramping in RLQ, LLQ of abdomenx 5days. ?

## 2021-07-05 NOTE — ED Provider Triage Note (Signed)
Emergency Medicine Provider Triage Evaluation Note ? ?Carla Sloan , a 61 y.o. female  was evaluated in triage.  Pt complains of singular lumps with surrounding ecchymosis on bilateral forearms.  Noticed them to 3 days ago.  They are not painful nor of the increase in size.  Currently on Xarelto for DVT identified in February.  Is supposed to take Xarelto for 3 months, is almost finished with it.  Denies shortness of breath, chest pain, dizziness, lightheadedness.  Denies back pain or lower extremity weakness. ? ?Review of Systems  ?Positive: As above ?Negative: As above ? ?Physical Exam  ?BP 116/60   Pulse (!) 154   Temp 98.1 ?F (36.7 ?C) (Oral)   Resp 16   Ht '5\' 6"'$  (1.676 m)   Wt 114.8 kg   LMP 09/25/2015 (Approximate)   SpO2 (!) 87%   BMI 40.85 kg/m?  ?Gen:   Awake, no distress, sitting comfortably ?Resp:  Normal effort, CTAB, able to breathe and communicate without difficulty ?MSK:   Moves extremities without difficulty  ?Other:  Tachycardic at 154 bpm.  SPO2 87%.:  Sized cysts/lumps on the anterior medial forearm just distal of the olecranon, beard bilaterally.  These masses are nontender and mobile, with surrounding ecchymosis. ? ?Medical Decision Making  ?Medically screening exam initiated at 7:53 PM.  Appropriate orders placed.  Carla Sloan was informed that the remainder of the evaluation will be completed by another provider, this initial triage assessment does not replace that evaluation, and the importance of remaining in the ED until their evaluation is complete. ? ?Labs, imaging, EKG ordered ?  ?Prince Rome, PA-C ?[birth year] 2008 ? ?

## 2021-07-05 NOTE — ED Notes (Signed)
Vital signs rechecked by this RN. Pt presents as stable. RR even and ulabored with no distress noted.Lung sounds clear.  ?

## 2021-07-06 DIAGNOSIS — M79601 Pain in right arm: Secondary | ICD-10-CM | POA: Diagnosis not present

## 2021-07-06 NOTE — Discharge Instructions (Signed)
As discussed, your evaluation today has been largely reassuring.  But, it is important that you monitor your condition carefully, and do not hesitate to return to the ED if you develop new, or concerning changes in your condition. ? ?Apply warm compresses to each of the painful areas.  Over the next few days they should improve. ? ?Otherwise, please follow-up with your physician for appropriate ongoing care. ? ?

## 2021-07-06 NOTE — ED Provider Notes (Signed)
?[facility] ?Provider Note ? ? ?CSN: [id number] ?Arrival date & time: 07/05/21  1716 ? ?  ? ?History ? ?Chief Complaint  ?Patient presents with  ? bumps on arms  ? ? ?Carla Sloan is a 61 y.o. female. ? ?HPI ?Patient presents with concern of pain in both arms.  Patient has multiple medical issues, including DVT for which she is taking a blood thinning medication.  Onset was a few days ago.  Since that time she has had discoloration and pain in both proximal forearms. ?  ? ?Home Medications ?Prior to Admission medications   ?Medication Sig Start Date End Date Taking? Authorizing Provider  ?acetaminophen (TYLENOL) 500 MG tablet Take 1 tablet (500 mg total) by mouth every 6 (six) hours as needed. 05/11/20   Hughie Closs, PA-C  ?amLODipine (NORVASC) 5 MG tablet Take 1 tablet (5 mg total) by mouth daily. 11/29/20 12/29/20  Eustaquio Maize, PA-C  ?azelastine (OPTIVAR) 0.05 % ophthalmic solution Place 1 drop into both eyes 2 (two) times daily.    [provider]  ?bimatoprost (LUMIGAN) 0.03 % ophthalmic solution Place 1 drop into both eyes at bedtime.    [provider]  ?brimonidine-timolol (COMBIGAN) 0.2-0.5 % ophthalmic solution Place 1 drop into both eyes every 12 (twelve) hours.    [provider]  ?calcium citrate-vitamin D (CELEBRATE CALCIUM CITRATE) 500-500 MG-UNIT chewable tablet Chew 1 tablet by mouth every evening.    [provider]  ?CEQUA 0.09 % SOLN Place 1 drop into both eyes 2 (two) times daily. 11/17/19   [provider]  ?citalopram (CELEXA) 40 MG tablet Take 40 mg by mouth daily.    [provider]  ?cyclobenzaprine (FLEXERIL) 5 MG tablet Take 5 mg by mouth 3 (three) times daily. 06/20/13   [provider]  ?diclofenac Sodium (VOLTAREN) 1 % GEL Apply 4 g topically 4 (four) times daily. 08/02/20   Hazel Sams, PA-C  ?Elastic Bandages & Supports (WRIST SPLINT/COCK-UP/LEFT L) MISC Wear  nightly and with exacerbating activities. 10/24/19   Patriciaann Clan, DO  ?Elastic Bandages & Supports (WRIST SPLINT/COCK-UP/RIGHT L) MISC Wear nightly and with exacerbating activities. 10/24/19   Patriciaann Clan, DO  ?ergocalciferol (VITAMIN D2) 1.25 MG (50000 UT) capsule Take 50,000 Units by mouth once a week.    [provider]  ?ferrous sulfate 324 MG TBEC Take 324 mg by mouth daily with breakfast.    [provider]  ?gabapentin (NEURONTIN) 800 MG tablet Take 800-1,600 mg by mouth 2 (two) times daily. Take 800 mg in the morning and 1600 mg at bedtime    [provider]  ?hydrochlorothiazide (HYDRODIURIL) 25 MG tablet Take 1 tablet (25 mg total) by mouth daily. 11/29/20 12/29/20  Eustaquio Maize, PA-C  ?hydrOXYzine (ATARAX/VISTARIL) 25 MG tablet Take 1-2 tablets (25-50 mg total) by mouth at bedtime as needed. Take for insomnia. ?Patient taking differently: Take 100 mg by mouth at bedtime. Take for insomnia. 05/21/19   Jaynee Eagles, PA-C  ?Lurasidone HCl (LATUDA) 60 MG TABS Take 60 mg by mouth at bedtime.    [provider]  ?metroNIDAZOLE (FLAGYL) 500 MG tablet Take 1 tablet (500 mg total) by mouth 2 (two) times daily. 04/23/21   Lamptey, Myrene Galas, MD  ?Multiple Vitamins-Minerals (CELEBRATE MULTI-COMPLETE 16) CHEW Chew 1 each by mouth daily.    [provider]  ?nitrofurantoin, macrocrystal-monohydrate, (MACROBID) 100 MG capsule Take 1 capsule (100 mg total) by mouth 2 (two) times  daily. 04/20/21   Raspet, Derry Skill, PA-C  ?ondansetron (ZOFRAN-ODT) 4 MG disintegrating tablet Take 1 tablet (4 mg total) by mouth every 6 (six) hours as needed for nausea or vomiting. 04/25/20   Clovis Riley, MD  ?pantoprazole (PROTONIX) 40 MG tablet Take 1 tablet (40 mg total) by mouth daily. 04/25/20   Clovis Riley, MD  ?pravastatin (PRAVACHOL) 20 MG tablet Take 20 mg by mouth daily. 12/02/19   [provider]  ?RIVAROXABAN Alveda Reasons) VTE STARTER PACK (15 & 20 MG) Follow package  directions: Take one '15mg'$  tablet by mouth twice a day. On day 22, switch to one '20mg'$  tablet once a day. Take with food. 04/21/21   Azucena Cecil, PA-C  ?traMADol (ULTRAM) 50 MG tablet Take 1 tablet (50 mg total) by mouth every 6 (six) hours as needed (pain). 04/25/20   Clovis Riley, MD  ?   ? ?Allergies    ?Patient has no known allergies.   ? ?Review of Systems   ?Review of Systems  ?All other systems reviewed and are negative. ? ?Physical Exam ?Updated Vital Signs ?BP 119/89   Pulse 67   Temp 97.7 ?F (36.5 ?C) (Oral)   Resp (!) 9   Ht '5\' 6"'$  (1.676 m)   Wt 114.8 kg   LMP 09/25/2015 (Approximate)   SpO2 95%   BMI 40.85 kg/m?  ?Physical Exam ?Vitals and nursing note reviewed.  ?Constitutional:   ?   General: She is not in acute distress. ?   Appearance: She is well-developed.  ?HENT:  ?   Head: Normocephalic and atraumatic.  ?Eyes:  ?   Conjunctiva/sclera: Conjunctivae normal.  ?Cardiovascular:  ?   Rate and Rhythm: Normal rate and regular rhythm.  ?   Pulses: Normal pulses.  ?Pulmonary:  ?   Effort: Pulmonary effort is normal. No respiratory distress.  ?   Breath sounds: No stridor.  ?Abdominal:  ?   General: There is no distension.  ?Musculoskeletal:  ?     Arms: ? ?Skin: ?   General: Skin is warm and dry.  ?Neurological:  ?   Mental Status: She is alert and oriented to person, place, and time.  ?   Cranial Nerves: No cranial nerve deficit.  ?Psychiatric:     ?   Mood and Affect: Mood normal.  ? ? ?ED Results / Procedures / Treatments   ?Labs ?(all labs ordered are listed, but only abnormal results are displayed) ?Labs Reviewed  ?BASIC METABOLIC PANEL - Abnormal; Notable for the following components:  ?    Result Value  ? Glucose, Bld 112 (*)   ? All other components within normal limits  ?CBC WITH DIFFERENTIAL/PLATELET - Abnormal; Notable for the following components:  ? Hemoglobin 11.7 (*)   ? All other components within normal limits  ? ? ?EKG ?EKG Interpretation ? ?Date/Time:  Saturday Jul 06 2021 09:35:35 EDT ?Ventricular Rate:  66 ?PR Interval:  189 ?QRS Duration: 104 ?QT Interval:  425 ?QTC Calculation: 446 ?R Axis:   40 ?Text Interpretation: Sinus rhythm Abnormal R-wave progression, early transition Probable LVH with secondary repol abnrm Artifact Abnormal ECG Confirmed by Carmin Muskrat [phone]) on 07/06/2021 9:49:22 AM ? ?Radiology ?DG Chest 1 View ? ?Result Date: 07/05/2021 ?CLINICAL DATA:  Tachycardia EXAM: CHEST  1 VIEW COMPARISON:  03/30/2021 FINDINGS: Single frontal view of the chest demonstrates a stable cardiac silhouette. Chronic elevation of the left hemidiaphragm. No airspace disease, effusion, or pneumothorax. No acute bony abnormality. IMPRESSION:  1. Stable chest, no acute process. Electronically Signed   By: Randa Ngo M.D.   On: 07/05/2021 21:07   ? ?Procedures ?Procedures  ? ? ?Medications Ordered in ED ?Medications - No data to display ? ?ED Course/ Medical Decision Making/ A&P ? ?This patient with a Hx of multiple medical issues including DVT, on anticoagulation, with elevated risk profile for coronary disease presents to the ED for concern of bilateral arm pain, this involves an extensive number of treatment options, and is a complaint that carries with it a high risk of complications and morbidity.   ? ?The differential diagnosis includes infection, neurologic dysfunction, less likely ACS, hematoma ? ? ?Social Determinants of Health: ? ?Age ? ?Additional history obtained: ? ?Additional history and/or information obtained from chart review, notable for ongoing therapy for DVT ? ? ?After the initial evaluation, orders, including: X-ray, labs, ECG from triage were initiated. ? ? ?Patient placed on Cardiac and Pulse-Oximetry Monitors. ?The patient was maintained on a cardiac monitor.  The cardiac monitored showed an rhythm of 65 sinus normal ?The patient was also maintained on pulse oximetry. The readings were typically 100% room air normal ? ? ?On repeat evaluation of the patient  stayed the same ? ?Lab Tests: ? ?I personally interpreted labs.  The pertinent results include: Minor hyperglycemia otherwise unremarkable ? ?Imaging Studies ordered: ? ?I independently visualized and interpreted imagi

## 2021-07-17 ENCOUNTER — Encounter (HOSPITAL_COMMUNITY): Payer: Self-pay

## 2021-07-17 ENCOUNTER — Ambulatory Visit (HOSPITAL_COMMUNITY)
Admission: EM | Admit: 2021-07-17 | Discharge: 2021-07-17 | Disposition: A | Discharge status: Home / Self Care | Payer: Medicare HMO | Attending: Physician Assistant | Admitting: Physician Assistant

## 2021-07-17 DIAGNOSIS — Z79899 Other long term (current) drug therapy: Secondary | ICD-10-CM | POA: Insufficient documentation

## 2021-07-17 DIAGNOSIS — R102 Pelvic and perineal pain: Secondary | ICD-10-CM | POA: Insufficient documentation

## 2021-07-17 DIAGNOSIS — R350 Frequency of micturition: Secondary | ICD-10-CM | POA: Insufficient documentation

## 2021-07-17 DIAGNOSIS — M545 Low back pain, unspecified: Secondary | ICD-10-CM | POA: Diagnosis present

## 2021-07-17 DIAGNOSIS — M546 Pain in thoracic spine: Secondary | ICD-10-CM | POA: Insufficient documentation

## 2021-07-17 LAB — POCT URINALYSIS DIPSTICK, ED / UC
Bilirubin Urine: NEGATIVE
Glucose, UA: NEGATIVE mg/dL
Ketones, ur: NEGATIVE mg/dL
Nitrite: NEGATIVE
Protein, ur: NEGATIVE mg/dL
Specific Gravity, Urine: 1.025 (ref 1.005–1.030)
Urobilinogen, UA: 0.2 mg/dL (ref 0.0–1.0)
pH: 5.5 (ref 5.0–8.0)

## 2021-07-17 MED ORDER — LIDOCAINE 5 % EX PTCH
1.0000 | MEDICATED_PATCH | CUTANEOUS | 0 refills | Status: DC
Start: 2021-07-17 — End: 2022-11-04

## 2021-07-17 NOTE — ED Triage Notes (Signed)
2 day h/o right sided lumbar pain. Has been taking tylenol and applying bengay without relief. No falls or injuries.

## 2021-07-17 NOTE — Discharge Instructions (Signed)
Your urine did have some evidence of infection.  I will send this off for culture but we will wait to start any antibiotics until you receive these results.  Make sure you are drinking plenty of fluid.  Continue your prescribed medication including oxycodone and Flexeril.  I have called in a lidocaine patch to help with your symptoms.  Use gentle stretch for additional symptom relief.  If anything worsens please return for reevaluation.

## 2021-07-17 NOTE — ED Provider Notes (Signed)
[place]    CSN: [id number] Arrival date & time: 07/17/21  1518      History   Chief Complaint Chief Complaint  Patient presents with   Back Pain    HPI Carla Sloan is a 61 y.o. female.   Patient presents today with a 2-day history of right thoracic/lumbar back pain.  She denies any known injury or increase in activity prior to symptom onset.  Reports pain is rated 8 on a 0-10 pain scale, localized to right thoracic/lumbar region, described as aching, no aggravating relieving factors identified.  She denies any bowel/bladder incontinence, lower extremity weakness, saddle anesthesia.  She has been taking Tylenol as well as using IcyHot without improvement of symptoms.  She has a history of spasms in her back and lower extremities and is prescribed Flexeril on a scheduled basis which she has been taking without improvement of symptoms.  She denies any recent falls.  Denies any bowel/bladder incontinence, lower extremity weakness, saddle anesthesia.  She denies history of malignancy.  She denies any significant urinary symptoms but does report some frequency that she attributes this to increased water intake.   Past Medical History:  Diagnosis Date   Arthritis    Depression    Hypertension    Muscle spasms of lower extremity    and back   Sleep apnea     Patient Active Problem List   Diagnosis Date Noted   Morbid obesity ([place]) 04/24/2020    Past Surgical History:  Procedure Laterality Date   COLONOSCOPY WITH PROPOFOL N/A 09/24/2012   Procedure: COLONOSCOPY WITH PROPOFOL;  Surgeon: Jeryl Columbia, MD;  Location: [facility] ENDOSCOPY;  Service: Endoscopy;  Laterality: N/A;   COLONOSCOPY WITH PROPOFOL N/A 05/19/2016   Procedure: COLONOSCOPY WITH PROPOFOL;  Surgeon: Clarene Essex, MD;  Location: [facility] ENDOSCOPY;  Service: Endoscopy;  Laterality: N/A;   LAPAROSCOPIC GASTRIC SLEEVE RESECTION N/A 04/24/2020   Procedure: LAPAROSCOPIC GASTRIC SLEEVE RESECTION;  Surgeon: Clovis Riley, MD;  Location: [facility] ORS;  Service: General;  Laterality: N/A;   NO PAST SURGERIES     TUBAL LIGATION     UPPER GI ENDOSCOPY N/A 04/24/2020   Procedure: UPPER GI ENDOSCOPY;  Surgeon: Clovis Riley, MD;  Location: [facility] ORS;  Service: General;  Laterality: N/A;    OB History     Gravida  2   Para  2   Term  2   Preterm      AB      Living  2      SAB      IAB      Ectopic      Multiple      Live Births  2            Home Medications    Prior to Admission medications   Medication Sig Start Date End Date Taking? Authorizing Provider  lidocaine (LIDODERM) 5 % Place 1 patch onto the skin daily. Remove & Discard patch within 12 hours or as directed by MD 07/17/21  Yes Natalee Tomkiewicz, Derry Skill, PA-C  acetaminophen (TYLENOL) 500 MG tablet Take 1 tablet (500 mg total) by mouth every 6 (six) hours as needed. 05/11/20   Hughie Closs, PA-C  amLODipine (NORVASC) 5 MG tablet Take 1 tablet (5 mg total) by mouth daily. 11/29/20 12/29/20  Eustaquio Maize, PA-C  azelastine (OPTIVAR) 0.05 % ophthalmic solution Place 1 drop into both eyes 2 (two) times daily.    [provider]  bimatoprost (  LUMIGAN) 0.03 % ophthalmic solution Place 1 drop into both eyes at bedtime.    [provider]  brimonidine-timolol (COMBIGAN) 0.2-0.5 % ophthalmic solution Place 1 drop into both eyes every 12 (twelve) hours.    [provider]  calcium citrate-vitamin D (CELEBRATE CALCIUM CITRATE) 500-500 MG-UNIT chewable tablet Chew 1 tablet by mouth every evening.    [provider]  CEQUA 0.09 % SOLN Place 1 drop into both eyes 2 (two) times daily. 11/17/19   [provider]  citalopram (CELEXA) 40 MG tablet Take 40 mg by mouth daily.    [provider]  cyclobenzaprine (FLEXERIL) 5 MG tablet Take 5 mg by mouth 3 (three) times daily. 06/20/13   [provider]  diclofenac Sodium (VOLTAREN) 1 % GEL Apply 4 g topically 4 (four) times daily. 08/02/20    Hazel Sams, PA-C  Elastic Bandages & Supports (WRIST SPLINT/COCK-UP/LEFT L) MISC Wear nightly and with exacerbating activities. 10/24/19   Patriciaann Clan, DO  Elastic Bandages & Supports (WRIST SPLINT/COCK-UP/RIGHT L) MISC Wear nightly and with exacerbating activities. 10/24/19   Patriciaann Clan, DO  ergocalciferol (VITAMIN D2) 1.25 MG (50000 UT) capsule Take 50,000 Units by mouth once a week.    [provider]  ferrous sulfate 324 MG TBEC Take 324 mg by mouth daily with breakfast.    [provider]  gabapentin (NEURONTIN) 800 MG tablet Take 800-1,600 mg by mouth 2 (two) times daily. Take 800 mg in the morning and 1600 mg at bedtime    [provider]  hydrochlorothiazide (HYDRODIURIL) 25 MG tablet Take 1 tablet (25 mg total) by mouth daily. 11/29/20 12/29/20  Eustaquio Maize, PA-C  hydrOXYzine (ATARAX/VISTARIL) 25 MG tablet Take 1-2 tablets (25-50 mg total) by mouth at bedtime as needed. Take for insomnia. Patient taking differently: Take 100 mg by mouth at bedtime. Take for insomnia. 05/21/19   Jaynee Eagles, PA-C  Lurasidone HCl (LATUDA) 60 MG TABS Take 60 mg by mouth at bedtime.    [provider]  metroNIDAZOLE (FLAGYL) 500 MG tablet Take 1 tablet (500 mg total) by mouth 2 (two) times daily. 04/23/21   Lamptey, Myrene Galas, MD  Multiple Vitamins-Minerals (CELEBRATE MULTI-COMPLETE 20) CHEW Chew 1 each by mouth daily.    [provider]  nitrofurantoin, macrocrystal-monohydrate, (MACROBID) 100 MG capsule Take 1 capsule (100 mg total) by mouth 2 (two) times daily. 04/20/21   Lillyauna Jenkinson K, PA-C  ondansetron (ZOFRAN-ODT) 4 MG disintegrating tablet Take 1 tablet (4 mg total) by mouth every 6 (six) hours as needed for nausea or vomiting. 04/25/20   Clovis Riley, MD  pantoprazole (PROTONIX) 40 MG tablet Take 1 tablet (40 mg total) by mouth daily. 04/25/20   Clovis Riley, MD  pravastatin (PRAVACHOL) 20 MG tablet Take 20 mg by mouth daily. 12/02/19    [provider]  RIVAROXABAN Alveda Reasons) VTE STARTER PACK (15 & 20 MG) Follow package directions: Take one '15mg'$  tablet by mouth twice a day. On day 22, switch to one '20mg'$  tablet once a day. Take with food. 04/21/21   Azucena Cecil, PA-C  traMADol (ULTRAM) 50 MG tablet Take 1 tablet (50 mg total) by mouth every 6 (six) hours as needed (pain). 04/25/20   Clovis Riley, MD    Family History Family History  Problem Relation Age of Onset   Hypertension Mother    Diabetes Mother    Breast cancer Mother    Diabetes Father    Hypertension  Father     Social History Social History   Tobacco Use   Smoking status: Never   Smokeless tobacco: Never  Vaping Use   Vaping Use: Never used  Substance Use Topics   Alcohol use: No   Drug use: No     Allergies   Patient has no known allergies.   Review of Systems Review of Systems  Constitutional:  Positive for activity change. Negative for appetite change, fatigue and fever.  Gastrointestinal:  Negative for abdominal pain, diarrhea, nausea and vomiting.  Genitourinary:  Negative for dysuria, flank pain, frequency and urgency.  Musculoskeletal:  Positive for back pain. Negative for arthralgias and myalgias.  Neurological:  Negative for dizziness, weakness, light-headedness, numbness and headaches.    Physical Exam Triage Vital Signs ED Triage Vitals [07/17/21 1610]  Enc Vitals Group     BP (!) 148/81     Pulse Rate 70     Resp 17     Temp 97.7 F (36.5 C)     Temp Source Oral     SpO2 100 %     Weight      Height      Head Circumference      Peak Flow      Pain Score 8     Pain Loc      Pain Edu?      Excl. in [place]?    No data found.  Updated Vital Signs BP (!) 148/81 (BP Location: Right Arm)   Pulse 70   Temp 97.7 F (36.5 C) (Oral)   Resp 17   LMP 09/25/2015 (Approximate)   SpO2 100%   Visual Acuity Right Eye Distance:   Left Eye Distance:   Bilateral Distance:    Right Eye Near:   Left Eye  Near:    Bilateral Near:     Physical Exam Vitals reviewed.  Constitutional:      General: She is awake. She is not in acute distress.    Appearance: Normal appearance. She is well-developed. She is not ill-appearing.     Comments: Very pleasant female appears stated age in no acute distress sitting comfortably in exam room  HENT:     Head: Normocephalic and atraumatic.  Cardiovascular:     Rate and Rhythm: Normal rate and regular rhythm.     Heart sounds: Normal heart sounds, S1 normal and S2 normal. No murmur heard. Pulmonary:     Effort: Pulmonary effort is normal.     Breath sounds: Normal breath sounds. No wheezing, rhonchi or rales.     Comments: Clear to auscultation bilaterally Abdominal:     General: Bowel sounds are normal.     Palpations: Abdomen is soft.     Tenderness: There is no abdominal tenderness. There is no right CVA tenderness, left CVA tenderness, guarding or rebound.     Comments: Benign abdominal exam; no CVA tenderness.  Musculoskeletal:     Cervical back: No tenderness or bony tenderness.     Thoracic back: Tenderness present. No bony tenderness.     Lumbar back: Tenderness present. No bony tenderness. Negative right straight leg raise test and negative left straight leg raise test.       Back:     Comments: Back: No pain percussion of vertebrae.  No deformity or step-off noted.  Mild tenderness palpation over right thoracic and lumbar paraspinal muscles.  Strength 5/5 bilateral lower extremities.  Psychiatric:        Behavior: Behavior is cooperative.  UC Treatments / Results  Labs (all labs ordered are listed, but only abnormal results are displayed) Labs Reviewed  POCT URINALYSIS DIPSTICK, ED / UC - Abnormal; Notable for the following components:      Result Value   Hgb urine dipstick TRACE (*)    Leukocytes,Ua TRACE (*)    All other components within normal limits  URINE CULTURE    EKG   Radiology No results  found.  Procedures Procedures (including critical care time)  Medications Ordered in UC Medications - No data to display  Initial Impression / Assessment and Plan / UC Course  I have reviewed the triage vital signs and the nursing notes.  Pertinent labs & imaging results that were available during my care of the patient were reviewed by me and considered in my medical decision making (see chart for details).     No indication for plain films as patient denies any recent trauma and has no bony tenderness on exam.  Low suspicion for pyelonephritis as she has no significant CVA tenderness and is afebrile, well-appearing, nontoxic.  UA was obtained that showed trace hemoglobin and leukocyte esterase.  She denies any significant UTI symptoms so we will defer antibiotic treatment until urine culture results are available.  Patient is currently prescribed oxycodone as well as Flexeril.  She can continue these medications for additional symptom relief.  She is unable to take NSAIDs due to chronic anticoagulation.  Recommend she continue Tylenol as previously prescribed.  She was prescribed lidocaine patches for additional symptom relief.  Discussed that she should use stretch for differential symptom relief.  Discussed that if anything worsens and she has severe pain, bowel/bladder incontinence, lower extremity weakness, saddle anesthesia, fever, nausea, vomiting she needs to be seen immediately to which she expressed understanding.  Strict return precautions given.  Final Clinical Impressions(s) / UC Diagnoses   Final diagnoses:  Acute right-sided thoracic back pain  Pain in right lumbar region of back     Discharge Instructions      Your urine did have some evidence of infection.  I will send this off for culture but we will wait to start any antibiotics until you receive these results.  Make sure you are drinking plenty of fluid.  Continue your prescribed medication including oxycodone and  Flexeril.  I have called in a lidocaine patch to help with your symptoms.  Use gentle stretch for additional symptom relief.  If anything worsens please return for reevaluation.     ED Prescriptions     Medication Sig Dispense Auth. Provider   lidocaine (LIDODERM) 5 % Place 1 patch onto the skin daily. Remove & Discard patch within 12 hours or as directed by MD 30 patch Chan Sheahan K, PA-C      I have reviewed the PDMP during this encounter.   Terrilee Croak, PA-C 07/17/21 1636

## 2021-07-18 ENCOUNTER — Ambulatory Visit
Admission: RE | Admit: 2021-07-18 | Discharge: 2021-07-18 | Disposition: A | Discharge status: Home / Self Care | Payer: Medicare HMO | Source: Ambulatory Visit | Attending: Internal Medicine | Admitting: Internal Medicine

## 2021-07-18 DIAGNOSIS — Z1231 Encounter for screening mammogram for malignant neoplasm of breast: Secondary | ICD-10-CM

## 2021-07-18 LAB — URINE CULTURE: Culture: NO GROWTH

## 2021-07-20 ENCOUNTER — Emergency Department (HOSPITAL_COMMUNITY): Payer: Medicare HMO

## 2021-07-20 ENCOUNTER — Emergency Department (HOSPITAL_COMMUNITY)
Admission: EM | Admit: 2021-07-20 | Discharge: 2021-07-20 | Disposition: A | Discharge status: Home / Self Care | Payer: Medicare HMO | Attending: Emergency Medicine | Admitting: Emergency Medicine

## 2021-07-20 ENCOUNTER — Encounter (HOSPITAL_COMMUNITY): Payer: Self-pay | Admitting: Emergency Medicine

## 2021-07-20 ENCOUNTER — Other Ambulatory Visit: Payer: Self-pay

## 2021-07-20 ENCOUNTER — Telehealth: Payer: Self-pay

## 2021-07-20 DIAGNOSIS — R109 Unspecified abdominal pain: Secondary | ICD-10-CM | POA: Diagnosis present

## 2021-07-20 DIAGNOSIS — M545 Low back pain, unspecified: Secondary | ICD-10-CM | POA: Insufficient documentation

## 2021-07-20 DIAGNOSIS — Z79899 Other long term (current) drug therapy: Secondary | ICD-10-CM | POA: Diagnosis not present

## 2021-07-20 DIAGNOSIS — I10 Essential (primary) hypertension: Secondary | ICD-10-CM | POA: Diagnosis not present

## 2021-07-20 DIAGNOSIS — R350 Frequency of micturition: Secondary | ICD-10-CM | POA: Insufficient documentation

## 2021-07-20 LAB — URINALYSIS, ROUTINE W REFLEX MICROSCOPIC
Bacteria, UA: NONE SEEN
Bilirubin Urine: NEGATIVE
Glucose, UA: NEGATIVE mg/dL
Hgb urine dipstick: NEGATIVE
Ketones, ur: NEGATIVE mg/dL
Nitrite: NEGATIVE
Protein, ur: NEGATIVE mg/dL
Specific Gravity, Urine: 1.011 (ref 1.005–1.030)
pH: 7 (ref 5.0–8.0)

## 2021-07-20 MED ORDER — ORPHENADRINE CITRATE ER 100 MG PO TB12: 100.0000 mg | ORAL_TABLET | Freq: Two times a day (BID) | ORAL | 0 refills | Status: DC

## 2021-07-20 MED ORDER — ACETAMINOPHEN 325 MG PO TABS
650.0000 mg | ORAL_TABLET | Freq: Once | ORAL | Status: AC
  Administered 2021-07-20: 650 mg via ORAL
  Filled 2021-07-20: qty 2

## 2021-07-20 MED ORDER — NAPROXEN 500 MG PO TABS: 500.0000 mg | ORAL_TABLET | Freq: Two times a day (BID) | ORAL | 0 refills | Status: DC

## 2021-07-20 MED ORDER — LEVETIRACETAM IN NACL 1000 MG/100ML IV SOLN: 1000.0000 mg | Freq: Once | INTRAVENOUS | Status: DC

## 2021-07-20 MED ORDER — NAPROXEN 250 MG PO TABS
500.0000 mg | ORAL_TABLET | Freq: Once | ORAL | Status: AC
  Administered 2021-07-20: 500 mg via ORAL
  Filled 2021-07-20: qty 2

## 2021-07-20 MED ORDER — CYCLOBENZAPRINE HCL 10 MG PO TABS
10.0000 mg | ORAL_TABLET | Freq: Once | ORAL | Status: AC
Start: 2021-07-20 — End: 2021-07-20
  Administered 2021-07-20: 10 mg via ORAL
  Filled 2021-07-20: qty 1

## 2021-07-20 NOTE — Telephone Encounter (Signed)
Pharmasicist called patient was ordered norflex but it is contraindicated in glaucoma, she is already on flexaril 3 times a day. Discontinue norflex due to contraindications.

## 2021-07-20 NOTE — Discharge Instructions (Signed)
Your evaluation today did not show any sign of a urinary tract infection or kidney stone.  Your pain is likely secondary to a pulled muscle.  You have been given a prescription for an anti-inflammatory medication and a muscle relaxer.  Please take them as prescribed.  If you need additional pain relief, you may add acetaminophen.  Also, please apply ice to the sore area.  Ice to be applied for 30 minutes at a time, 4 times a day.  You may continue using the lidocaine patch if you desire, but is not 100% necessary.  Return if you are having any problems.

## 2021-07-20 NOTE — ED Provider Notes (Signed)
[facility] EMERGENCY DEPARTMENT Provider Note   CSN: [id number] Arrival date & time: 07/20/21  0255     History  Chief Complaint  Patient presents with   Back Pain    Carla Sloan is a 61 y.o. female.  The history is provided by the patient.  Back Pain She has history of hypertension and comes in complaining of right flank pain for the last week.  Pain is worse with various movements.  There is no radiation of pain.  She denies nausea or vomiting denies fever or chills.  She has noted some urinary frequency, but states that she has been drinking a lot.  She was seen at an urgent care center where she was prescribed a lidocaine patch which does not seem to help.  She has also taken acetaminophen without relief.  She does have oxycodone at home, but has not taken that.   Home Medications Prior to Admission medications   Medication Sig Start Date End Date Taking? Authorizing Provider  acetaminophen (TYLENOL) 500 MG tablet Take 1 tablet (500 mg total) by mouth every 6 (six) hours as needed. 05/11/20   Hughie Closs, PA-C  amLODipine (NORVASC) 5 MG tablet Take 1 tablet (5 mg total) by mouth daily. 11/29/20 12/29/20  Eustaquio Maize, PA-C  azelastine (OPTIVAR) 0.05 % ophthalmic solution Place 1 drop into both eyes 2 (two) times daily.    [provider]  bimatoprost (LUMIGAN) 0.03 % ophthalmic solution Place 1 drop into both eyes at bedtime.    [provider]  brimonidine-timolol (COMBIGAN) 0.2-0.5 % ophthalmic solution Place 1 drop into both eyes every 12 (twelve) hours.    [provider]  calcium citrate-vitamin D (CELEBRATE CALCIUM CITRATE) 500-500 MG-UNIT chewable tablet Chew 1 tablet by mouth every evening.    [provider]  CEQUA 0.09 % SOLN Place 1 drop into both eyes 2 (two) times daily. 11/17/19   [provider]  citalopram (CELEXA) 40 MG tablet Take 40 mg by mouth daily.    [provider]   cyclobenzaprine (FLEXERIL) 5 MG tablet Take 5 mg by mouth 3 (three) times daily. 06/20/13   [provider]  diclofenac Sodium (VOLTAREN) 1 % GEL Apply 4 g topically 4 (four) times daily. 08/02/20   Hazel Sams, PA-C  Elastic Bandages & Supports (WRIST SPLINT/COCK-UP/LEFT L) MISC Wear nightly and with exacerbating activities. 10/24/19   Patriciaann Clan, DO  Elastic Bandages & Supports (WRIST SPLINT/COCK-UP/RIGHT L) MISC Wear nightly and with exacerbating activities. 10/24/19   Patriciaann Clan, DO  ergocalciferol (VITAMIN D2) 1.25 MG (50000 UT) capsule Take 50,000 Units by mouth once a week.    [provider]  ferrous sulfate 324 MG TBEC Take 324 mg by mouth daily with breakfast.    [provider]  gabapentin (NEURONTIN) 800 MG tablet Take 800-1,600 mg by mouth 2 (two) times daily. Take 800 mg in the morning and 1600 mg at bedtime    [provider]  hydrochlorothiazide (HYDRODIURIL) 25 MG tablet Take 1 tablet (25 mg total) by mouth daily. 11/29/20 12/29/20  Eustaquio Maize, PA-C  hydrOXYzine (ATARAX/VISTARIL) 25 MG tablet Take 1-2 tablets (25-50 mg total) by mouth at bedtime as needed. Take for insomnia. Patient taking differently: Take 100 mg by mouth at bedtime. Take for insomnia. 05/21/19   Jaynee Eagles, PA-C  lidocaine (LIDODERM) 5 % Place 1 patch onto the skin daily. Remove & Discard patch within 12 hours or as directed by  MD 07/17/21   Raspet, Derry Skill, PA-C  Lurasidone HCl (LATUDA) 60 MG TABS Take 60 mg by mouth at bedtime.    [provider]  metroNIDAZOLE (FLAGYL) 500 MG tablet Take 1 tablet (500 mg total) by mouth 2 (two) times daily. 04/23/21   Lamptey, Myrene Galas, MD  Multiple Vitamins-Minerals (CELEBRATE MULTI-COMPLETE 68) CHEW Chew 1 each by mouth daily.    [provider]  nitrofurantoin, macrocrystal-monohydrate, (MACROBID) 100 MG capsule Take 1 capsule (100 mg total) by mouth 2 (two) times daily. 04/20/21   Raspet, Erin K, PA-C   ondansetron (ZOFRAN-ODT) 4 MG disintegrating tablet Take 1 tablet (4 mg total) by mouth every 6 (six) hours as needed for nausea or vomiting. 04/25/20   Clovis Riley, MD  pantoprazole (PROTONIX) 40 MG tablet Take 1 tablet (40 mg total) by mouth daily. 04/25/20   Clovis Riley, MD  pravastatin (PRAVACHOL) 20 MG tablet Take 20 mg by mouth daily. 12/02/19   [provider]  RIVAROXABAN Alveda Reasons) VTE STARTER PACK (15 & 20 MG) Follow package directions: Take one '15mg'$  tablet by mouth twice a day. On day 22, switch to one '20mg'$  tablet once a day. Take with food. 04/21/21   Azucena Cecil, PA-C  traMADol (ULTRAM) 50 MG tablet Take 1 tablet (50 mg total) by mouth every 6 (six) hours as needed (pain). 04/25/20   Clovis Riley, MD      Allergies    Patient has no known allergies.    Review of Systems   Review of Systems  Musculoskeletal:  Positive for back pain.  All other systems reviewed and are negative.  Physical Exam Updated Vital Signs BP (!) 153/93 (BP Location: Left Arm)   Pulse 77   Temp 97.8 F (36.6 C) (Oral)   Resp 16   LMP 09/25/2015 (Approximate)   SpO2 100%  Physical Exam Vitals and nursing note reviewed.  61 year old female, resting comfortably and in no acute distress. Vital signs are significant for elevated blood pressure. Oxygen saturation is 100%, which is normal. Head is normocephalic and atraumatic. PERRLA, gaze is disconjugate secondary to amblyopia. Oropharynx is clear. Neck is nontender and supple without adenopathy or JVD. Back is nontender in the midline.  There is mild right CVA tenderness.  Straight leg raise is negative. Lungs are clear without rales, wheezes, or rhonchi. Chest is nontender. Heart has regular rate and rhythm without murmur. Abdomen is soft, flat, nontender. Extremities have no cyanosis or edema, full range of motion is present. Skin is warm and dry without rash. Neurologic: Mental status is normal, cranial nerves are  intact, moves all extremities equally.  ED Results / Procedures / Treatments   Labs (all labs ordered are listed, but only abnormal results are displayed) Labs Reviewed  URINALYSIS, ROUTINE W REFLEX MICROSCOPIC - Abnormal; Notable for the following components:      Result Value   Leukocytes,Ua MODERATE (*)    All other components within normal limits   Radiology MM 3D SCREEN BREAST BILATERAL  Result Date: 07/18/2021 CLINICAL DATA:  Screening. EXAM: DIGITAL SCREENING BILATERAL MAMMOGRAM WITH TOMOSYNTHESIS AND CAD TECHNIQUE: Bilateral screening digital craniocaudal and mediolateral oblique mammograms were obtained. Bilateral screening digital breast tomosynthesis was performed. The images were evaluated with computer-aided detection. COMPARISON:  Previous exam(s). ACR Breast Density Category b: There are scattered areas of fibroglandular density. FINDINGS: There are no findings suspicious for malignancy. IMPRESSION: No mammographic evidence of malignancy. A result letter of this screening mammogram will  be mailed directly to the patient. RECOMMENDATION: Screening mammogram in one year. (Code:SM-B-01Y) BI-RADS CATEGORY  1: Negative. Electronically Signed   By: Franki Cabot M.D.   On: 07/18/2021 13:30   CT Renal Stone Study  Result Date: 07/20/2021 CLINICAL DATA:  Flank pain with kidney stone suspected. Right flank pain for a week EXAM: CT ABDOMEN AND PELVIS WITHOUT CONTRAST TECHNIQUE: Multidetector CT imaging of the abdomen and pelvis was performed following the standard protocol without IV contrast. RADIATION DOSE REDUCTION: This exam was performed according to the departmental dose-optimization program which includes automated exposure control, adjustment of the mA and/or kV according to patient size and/or use of iterative reconstruction technique. COMPARISON:  11/29/2020 FINDINGS: Lower chest:  Very elevated left diaphragm, partially covered. Hepatobiliary: No focal liver abnormality.No evidence  of biliary obstruction or stone. Pancreas: Unremarkable. Spleen: Unremarkable. Adrenals/Urinary Tract: Negative adrenals. No hydronephrosis or stone. Unremarkable bladder. Stomach/Bowel: Postoperative stomach. Negative for bowel obstruction or visible inflammation Vascular/Lymphatic: No acute vascular abnormality. No mass or adenopathy. Reproductive:No pathologic findings. Other: No ascites or pneumoperitoneum. Musculoskeletal: No acute abnormalities. IMPRESSION: No acute finding.  No hydronephrosis or nephrolithiasis. Electronically Signed   By: Jorje Guild M.D.   On: 07/20/2021 05:28    Procedures Procedures    Medications Ordered in ED Medications  naproxen (NAPROSYN) tablet 500 mg (has no administration in time range)  cyclobenzaprine (FLEXERIL) tablet 10 mg (has no administration in time range)  acetaminophen (TYLENOL) tablet 650 mg (has no administration in time range)    ED Course/ Medical Decision Making/ A&P                           Medical Decision Making Amount and/or Complexity of Data Reviewed Labs: ordered. Radiology: ordered.  Risk Prescription drug management.   Right flank pain.  Differential diagnosis includes, but is not limited to, urolithiasis, cholecystitis, pancreatitis, abdominal aneurysm, pulled muscles.  Old records are reviewed confirming urgent care visit on 5/24 for right flank pain.  Urine culture at that time had no growth.  I have reviewed and interpreted laboratory testing today, and urine shows moderate leukocytes on dipstick, but only 0-5 WBCs on microscopic.  She will be sent for renal stone protocol CT scan to evaluate for possible urolithiasis.  CT scan showed no evidence of kidney stones or other significant intra-abdominal pathology.  I have independently viewed the images, and agree with radiologist's interpretation.  At this point, patient will need to be treated for musculoskeletal pain.  She is discharged with prescriptions for naproxen and  orphenadrine.  Told to apply ice and use over-the-counter acetaminophen as needed.  She already has a follow-up appointment scheduled with her primary care provider in 3 days, she is to keep that appointment.  Final Clinical Impression(s) / ED Diagnoses Final diagnoses:  Right flank pain  Elevated blood pressure reading with diagnosis of hypertension    Rx / DC Orders ED Discharge Orders          Ordered    naproxen (NAPROSYN) 500 MG tablet  2 times daily        07/20/21 0638    orphenadrine (NORFLEX) 100 MG tablet  2 times daily        07/20/21 7793              Delora Fuel, MD 90/30/09 [phone]

## 2021-07-20 NOTE — ED Triage Notes (Signed)
Pt reported to ED with c/o pain to right middle back below rib cage. Pt states pain has been ongoing since Tuesday. Denies any nausea vomiting. Denies any urinary urgency but endorses frequency d/t ample water intake.

## 2021-08-05 ENCOUNTER — Other Ambulatory Visit (HOSPITAL_COMMUNITY): Payer: Self-pay

## 2021-08-05 MED ORDER — OXYCODONE-ACETAMINOPHEN 10-325 MG PO TABS
ORAL_TABLET | ORAL | 0 refills | Status: AC
  Filled 2021-08-05: qty 60, 30d supply, fill #0

## 2021-08-19 DIAGNOSIS — H0102B Squamous blepharitis left eye, upper and lower eyelids: Secondary | ICD-10-CM | POA: Diagnosis not present

## 2021-08-19 DIAGNOSIS — H401232 Low-tension glaucoma, bilateral, moderate stage: Secondary | ICD-10-CM | POA: Diagnosis not present

## 2021-08-19 DIAGNOSIS — H35033 Hypertensive retinopathy, bilateral: Secondary | ICD-10-CM | POA: Diagnosis not present

## 2021-08-19 DIAGNOSIS — H16223 Keratoconjunctivitis sicca, not specified as Sjogren's, bilateral: Secondary | ICD-10-CM | POA: Diagnosis not present

## 2021-08-19 DIAGNOSIS — Z961 Presence of intraocular lens: Secondary | ICD-10-CM | POA: Diagnosis not present

## 2021-08-19 DIAGNOSIS — H0102A Squamous blepharitis right eye, upper and lower eyelids: Secondary | ICD-10-CM | POA: Diagnosis not present

## 2021-08-19 DIAGNOSIS — H5010 Unspecified exotropia: Secondary | ICD-10-CM | POA: Diagnosis not present

## 2021-08-28 DIAGNOSIS — F333 Major depressive disorder, recurrent, severe with psychotic symptoms: Secondary | ICD-10-CM | POA: Diagnosis not present

## 2021-09-24 DIAGNOSIS — M170 Bilateral primary osteoarthritis of knee: Secondary | ICD-10-CM | POA: Diagnosis not present

## 2021-09-24 DIAGNOSIS — G894 Chronic pain syndrome: Secondary | ICD-10-CM | POA: Diagnosis not present

## 2021-10-10 ENCOUNTER — Ambulatory Visit (INDEPENDENT_AMBULATORY_CARE_PROVIDER_SITE_OTHER): Payer: Medicare HMO | Admitting: Obstetrics and Gynecology

## 2021-10-10 ENCOUNTER — Other Ambulatory Visit (HOSPITAL_COMMUNITY)
Admission: RE | Admit: 2021-10-10 | Discharge: 2021-10-10 | Disposition: A | Discharge status: Home / Self Care | Payer: Medicare HMO | Source: Ambulatory Visit | Attending: Obstetrics and Gynecology | Admitting: Obstetrics and Gynecology

## 2021-10-10 ENCOUNTER — Encounter: Payer: Self-pay | Admitting: Obstetrics and Gynecology

## 2021-10-10 VITALS — BP 122/76 | HR 90 | Ht 66.0 in | Wt 252.0 lb

## 2021-10-10 DIAGNOSIS — Z113 Encounter for screening for infections with a predominantly sexual mode of transmission: Secondary | ICD-10-CM

## 2021-10-10 DIAGNOSIS — Z01419 Encounter for gynecological examination (general) (routine) without abnormal findings: Secondary | ICD-10-CM | POA: Diagnosis not present

## 2021-10-10 NOTE — Progress Notes (Signed)
GYNECOLOGY ANNUAL PREVENTATIVE CARE ENCOUNTER NOTE  History:     Carla Sloan is a 60 y.o. [phone] female here for a routine annual gynecologic exam.  Current complaints: none.   Denies abnormal vaginal bleeding, discharge, pelvic pain, problems with intercourse or other gynecologic concerns.   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   Gynecologic History Patient's last menstrual period was 09/25/2015 (approximate). Contraception: post menopausal status Last Pap: 08/31/20. Results were: normal with negative HPV Last mammogram: 07/18/21. Results were: normal  Obstetric History OB History  Gravida Para Term Preterm AB Living  '2 2 2     2  '$ SAB IAB Ectopic Multiple Live Births          2    # Outcome Date GA Lbr Len/2nd Weight Sex Delivery Anes PTL Lv  2 Term 12/09/93 [redacted]w[redacted]d 8 lb 6 oz (3.799 kg) M Vag-Spont EPI  LIV  1 Term 10/26/90 [id number] lb 6 oz (2.892 kg) F Vag-Spont EPI  LIV    Past Medical History:  Diagnosis Date   Arthritis    Depression    Hypertension    Muscle spasms of lower extremity    and back   Sleep apnea     Past Surgical History:  Procedure Laterality Date   COLONOSCOPY WITH PROPOFOL N/A 09/24/2012   Procedure: COLONOSCOPY WITH PROPOFOL;  Surgeon: MaJeryl ColumbiaMD;  Location: [facility];  Service: Endoscopy;  Laterality: N/A;   COLONOSCOPY WITH PROPOFOL N/A 05/19/2016   Procedure: COLONOSCOPY WITH PROPOFOL;  Surgeon: MaClarene EssexMD;  [facility];  Service: Endoscopy;  Laterality: N/A;   LAPAROSCOPIC GASTRIC SLEEVE RESECTION N/A 04/24/2020   Procedure: LAPAROSCOPIC GASTRIC SLEEVE RESECTION;  Surgeon: CoClovis RileyMD;  Location: [facility];  Service: General;  Laterality: N/A;   NO PAST SURGERIES     TUBAL LIGATION     UPPER GI ENDOSCOPY N/A 04/24/2020   Procedure: UPPER GI ENDOSCOPY;  Surgeon: CoClovis RileyMD;  Location: [facility];  Service: General;  Laterality: N/A;    Current Outpatient Medications on File  Prior to Visit  Medication Sig Dispense Refill   acetaminophen (TYLENOL) 500 MG tablet Take 1 tablet (500 mg total) by mouth every 6 (six) hours as needed. 30 tablet 0   azelastine (OPTIVAR) 0.05 % ophthalmic solution Place 1 drop into both eyes 2 (two) times daily.     bimatoprost (LUMIGAN) 0.03 % ophthalmic solution Place 1 drop into both eyes at bedtime.     brimonidine-timolol (COMBIGAN) 0.2-0.5 % ophthalmic solution Place 1 drop into both eyes every 12 (twelve) hours.     calcium citrate-vitamin D (CELEBRATE CALCIUM CITRATE) 500-500 MG-UNIT chewable tablet Chew 1 tablet by mouth every evening.     CEQUA 0.09 % SOLN Place 1 drop into both eyes 2 (two) times daily.     citalopram (CELEXA) 40 MG tablet Take 40 mg by mouth daily.     cyclobenzaprine (FLEXERIL) 5 MG tablet Take 5 mg by mouth 3 (three) times daily.     diclofenac Sodium (VOLTAREN) 1 % GEL Apply 4 g topically 4 (four) times daily. 100 g 1   Elastic Bandages & Supports (WRIST SPLINT/COCK-UP/LEFT L) MISC Wear nightly and with exacerbating activities. 1 each 0   Elastic Bandages & Supports (WRIST SPLINT/COCK-UP/RIGHT L) MISC Wear nightly and with exacerbating activities. 1 each 0   ergocalciferol (VITAMIN D2) 1.25 MG (50000 UT) capsule Take 50,000 Units by mouth  once a week.     ferrous sulfate 324 MG TBEC Take 324 mg by mouth daily with breakfast.     gabapentin (NEURONTIN) 800 MG tablet Take 800-1,600 mg by mouth 2 (two) times daily. Take 800 mg in the morning and 1600 mg at bedtime     hydrOXYzine (ATARAX/VISTARIL) 25 MG tablet Take 1-2 tablets (25-50 mg total) by mouth at bedtime as needed. Take for insomnia. (Patient taking differently: Take 100 mg by mouth at bedtime. Take for insomnia.) 60 tablet 0   lidocaine (LIDODERM) 5 % Place 1 patch onto the skin daily. Remove & Discard patch within 12 hours or as directed by MD 30 patch 0   Lurasidone HCl (LATUDA) 60 MG TABS Take 60 mg by mouth at bedtime.     metroNIDAZOLE (FLAGYL) 500  MG tablet Take 1 tablet (500 mg total) by mouth 2 (two) times daily. 14 tablet 0   Multiple Vitamins-Minerals (CELEBRATE MULTI-COMPLETE 45) CHEW Chew 1 each by mouth daily.     naproxen (NAPROSYN) 500 MG tablet Take 1 tablet (500 mg total) by mouth 2 (two) times daily. 30 tablet 0   nitrofurantoin, macrocrystal-monohydrate, (MACROBID) 100 MG capsule Take 1 capsule (100 mg total) by mouth 2 (two) times daily. 10 capsule 0   ondansetron (ZOFRAN-ODT) 4 MG disintegrating tablet Take 1 tablet (4 mg total) by mouth every 6 (six) hours as needed for nausea or vomiting. 20 tablet 0   orphenadrine (NORFLEX) 100 MG tablet Take 1 tablet (100 mg total) by mouth 2 (two) times daily. 30 tablet 0   oxyCODONE-acetaminophen (PERCOCET) 10-325 MG tablet Take 1 tablet by mouth twice a day as needed. 60 tablet 0   pantoprazole (PROTONIX) 40 MG tablet Take 1 tablet (40 mg total) by mouth daily. 90 tablet 0   pravastatin (PRAVACHOL) 20 MG tablet Take 20 mg by mouth daily.     RIVAROXABAN (XARELTO) VTE STARTER PACK (15 & 20 MG) Follow package directions: Take one '15mg'$  tablet by mouth twice a day. On day 22, switch to one '20mg'$  tablet once a day. Take with food. 51 each 0   amLODipine (NORVASC) 5 MG tablet Take 1 tablet (5 mg total) by mouth daily. 30 tablet 0   hydrochlorothiazide (HYDRODIURIL) 25 MG tablet Take 1 tablet (25 mg total) by mouth daily. 30 tablet 0   traMADol (ULTRAM) 50 MG tablet Take 1 tablet (50 mg total) by mouth every 6 (six) hours as needed (pain). (Patient not taking: Reported on 10/10/2021) 10 tablet 0   No current facility-administered medications on file prior to visit.    No Known Allergies  Social History:  reports that she has never smoked. She has never used smokeless tobacco. She reports that she does not drink alcohol and does not use drugs.  Family History  Problem Relation Age of Onset   Hypertension Mother    Diabetes Mother    Breast cancer Mother    Diabetes Father    Hypertension  Father     The following portions of the patient's history were reviewed and updated as appropriate: allergies, current medications, past family history, past medical history, past social history, past surgical history and problem list.  Review of Systems Pertinent items noted in HPI and remainder of comprehensive ROS otherwise negative.  Physical Exam:  BP 122/76   Pulse 90   Ht '5\' 6"'$  (1.676 m)   Wt 252 lb (114.3 kg)   LMP 09/25/2015 (Approximate)   BMI 40.67 kg/m  CONSTITUTIONAL:  Well-developed, well-nourished female in no acute distress.  HENT:  Normocephalic, atraumatic, External right and left ear normal. Oropharynx is clear and moist EYES: Conjunctivae and EOM are normal.  NECK: Normal range of motion, supple, no masses.  Normal thyroid.  SKIN: Skin is warm and dry. No rash noted. Not diaphoretic. No erythema. No pallor. MUSCULOSKELETAL: Normal range of motion. No tenderness.  No cyanosis, clubbing, or edema.  2+ distal pulses. NEUROLOGIC: Alert and oriented to person, place, and time. Normal reflexes, muscle tone coordination.  PSYCHIATRIC: Normal mood and affect. Normal behavior. Normal judgment and thought content. CARDIOVASCULAR: Normal heart rate noted, regular rhythm RESPIRATORY: Clear to auscultation bilaterally. Effort and breath sounds normal, no problems with respiration noted. BREASTS: Symmetric in size. No masses, tenderness, skin changes, nipple drainage, or lymphadenopathy bilaterally. Performed in the presence of a chaperone. ABDOMEN: Soft, no distention noted.  No tenderness, rebound or guarding.  PELVIC: Normal appearing external genitalia and urethral meatus; normal appearing vaginal mucosa and cervix.  No abnormal discharge noted.   Normal uterine size, no other palpable masses, no uterine or adnexal tenderness.  Performed in the presence of a chaperone.   Assessment and Plan:    1. Women's annual routine gynecological examination Normal annual exam  2.  Routine screening for STI (sexually transmitted infection) Pt request  - Cervicovaginal ancillary only( [facility]) - Hepatitis B surface antigen - Hepatitis C antibody - RPR - HIV Antibody (routine testing w rflx)  Will follow up results of pap smear and manage accordingly. Routine preventative health maintenance measures emphasized. Please refer to After Visit Summary for other counseling recommendations.      Lynnda Shields, MD, [place] for [facility], [place]

## 2021-10-10 NOTE — Progress Notes (Signed)
Pt presents for annual and pap. No complaints today per pt  Negative Mammogram screening 07/18/21 Colonoscopy due 05/20/2026 Bone Density completed 06/16/20

## 2021-10-11 LAB — HEPATITIS C ANTIBODY: Hep C Virus Ab: NONREACTIVE

## 2021-10-11 LAB — CERVICOVAGINAL ANCILLARY ONLY
Bacterial Vaginitis (gardnerella): POSITIVE — AB
Candida Glabrata: NEGATIVE
Candida Vaginitis: NEGATIVE
Chlamydia: NEGATIVE
Comment: NEGATIVE
Comment: NEGATIVE
Comment: NEGATIVE
Comment: NEGATIVE
Comment: NEGATIVE
Comment: NORMAL
Neisseria Gonorrhea: NEGATIVE
Trichomonas: POSITIVE — AB

## 2021-10-11 LAB — HIV ANTIBODY (ROUTINE TESTING W REFLEX): HIV Screen 4th Generation wRfx: NONREACTIVE

## 2021-10-11 LAB — HEPATITIS B SURFACE ANTIGEN: Hepatitis B Surface Ag: NEGATIVE

## 2021-10-11 LAB — RPR: RPR Ser Ql: NONREACTIVE

## 2021-10-15 ENCOUNTER — Other Ambulatory Visit: Payer: Self-pay | Admitting: Emergency Medicine

## 2021-10-15 DIAGNOSIS — B9689 Other specified bacterial agents as the cause of diseases classified elsewhere: Secondary | ICD-10-CM

## 2021-10-15 DIAGNOSIS — A599 Trichomoniasis, unspecified: Secondary | ICD-10-CM

## 2021-10-15 MED ORDER — METRONIDAZOLE 500 MG PO TABS: 500.0000 mg | ORAL_TABLET | Freq: Two times a day (BID) | ORAL | 0 refills | Status: DC

## 2021-10-15 NOTE — Progress Notes (Signed)
Rx for trich, BV sent to pharmacy.  Pt made aware, no further questions.

## 2021-11-01 ENCOUNTER — Other Ambulatory Visit: Payer: Self-pay

## 2021-11-01 DIAGNOSIS — B3731 Acute candidiasis of vulva and vagina: Secondary | ICD-10-CM

## 2021-11-01 MED ORDER — FLUCONAZOLE 150 MG PO TABS: 150.0000 mg | ORAL_TABLET | Freq: Once | ORAL | 0 refills | Status: AC

## 2021-11-01 NOTE — Progress Notes (Signed)
Pt called to report possible yeast infection.  Recent antibiotic use.

## 2021-11-15 ENCOUNTER — Encounter (HOSPITAL_COMMUNITY): Payer: Self-pay | Admitting: *Deleted

## 2021-11-18 DIAGNOSIS — F333 Major depressive disorder, recurrent, severe with psychotic symptoms: Secondary | ICD-10-CM | POA: Diagnosis not present

## 2021-11-22 DIAGNOSIS — Z903 Acquired absence of stomach [part of]: Secondary | ICD-10-CM | POA: Diagnosis not present

## 2021-11-22 DIAGNOSIS — E569 Vitamin deficiency, unspecified: Secondary | ICD-10-CM | POA: Diagnosis not present

## 2021-11-22 DIAGNOSIS — L987 Excessive and redundant skin and subcutaneous tissue: Secondary | ICD-10-CM | POA: Diagnosis not present

## 2021-11-22 DIAGNOSIS — R5383 Other fatigue: Secondary | ICD-10-CM | POA: Diagnosis not present

## 2021-11-25 ENCOUNTER — Other Ambulatory Visit (HOSPITAL_COMMUNITY)
Admission: RE | Admit: 2021-11-25 | Discharge: 2021-11-25 | Disposition: A | Discharge status: Home / Self Care | Payer: Medicare HMO | Source: Ambulatory Visit | Attending: Obstetrics and Gynecology | Admitting: Obstetrics and Gynecology

## 2021-11-25 ENCOUNTER — Ambulatory Visit (INDEPENDENT_AMBULATORY_CARE_PROVIDER_SITE_OTHER): Payer: Medicare HMO | Admitting: General Practice

## 2021-11-25 DIAGNOSIS — N760 Acute vaginitis: Secondary | ICD-10-CM | POA: Insufficient documentation

## 2021-11-25 DIAGNOSIS — E669 Obesity, unspecified: Secondary | ICD-10-CM | POA: Diagnosis not present

## 2021-11-25 DIAGNOSIS — Z113 Encounter for screening for infections with a predominantly sexual mode of transmission: Secondary | ICD-10-CM | POA: Diagnosis not present

## 2021-11-25 DIAGNOSIS — R69 Illness, unspecified: Secondary | ICD-10-CM | POA: Diagnosis not present

## 2021-11-25 DIAGNOSIS — E569 Vitamin deficiency, unspecified: Secondary | ICD-10-CM | POA: Diagnosis not present

## 2021-11-25 DIAGNOSIS — A599 Trichomoniasis, unspecified: Secondary | ICD-10-CM

## 2021-11-25 DIAGNOSIS — R5383 Other fatigue: Secondary | ICD-10-CM | POA: Diagnosis not present

## 2021-11-25 DIAGNOSIS — B9689 Other specified bacterial agents as the cause of diseases classified elsewhere: Secondary | ICD-10-CM | POA: Insufficient documentation

## 2021-11-25 DIAGNOSIS — K912 Postsurgical malabsorption, not elsewhere classified: Secondary | ICD-10-CM | POA: Diagnosis not present

## 2021-11-25 DIAGNOSIS — Z903 Acquired absence of stomach [part of]: Secondary | ICD-10-CM | POA: Diagnosis not present

## 2021-11-25 NOTE — Progress Notes (Signed)
SUBJECTIVE:  61 y.o. female presents for TOC. Pt tested positive for Trichomonas and BV on 10/10/21. Denies abnormal vaginal bleeding or significant pelvic pain or fever. No UTI symptoms. Denies history of known exposure to STD.  Patient's last menstrual period was 09/25/2015 (approximate).  OBJECTIVE:  She appears well, afebrile. Urine dipstick: not done.  ASSESSMENT:  Vaginal Discharge  Vaginal Odor   PLAN:  GC, chlamydia, trichomonas, BVAG, CVAG probe sent to lab. Treatment: To be determined once lab results are received ROV prn if symptoms persist or worsen.

## 2021-11-26 ENCOUNTER — Other Ambulatory Visit: Payer: Self-pay | Admitting: *Deleted

## 2021-11-26 DIAGNOSIS — A599 Trichomoniasis, unspecified: Secondary | ICD-10-CM

## 2021-11-26 DIAGNOSIS — B9689 Other specified bacterial agents as the cause of diseases classified elsewhere: Secondary | ICD-10-CM

## 2021-11-26 LAB — CERVICOVAGINAL ANCILLARY ONLY
Bacterial Vaginitis (gardnerella): POSITIVE — AB
Candida Glabrata: NEGATIVE
Candida Vaginitis: NEGATIVE
Chlamydia: NEGATIVE
Comment: NEGATIVE
Comment: NEGATIVE
Comment: NEGATIVE
Comment: NEGATIVE
Comment: NEGATIVE
Comment: NORMAL
Neisseria Gonorrhea: NEGATIVE
Trichomonas: POSITIVE — AB

## 2021-11-26 MED ORDER — METRONIDAZOLE 500 MG PO TABS: 500.0000 mg | ORAL_TABLET | Freq: Two times a day (BID) | ORAL | 0 refills | Status: DC

## 2021-11-26 NOTE — Progress Notes (Signed)
TC from pt regarding vaginal swab test results positive for trich and BV. RX Flagyl per protocol.

## 2021-12-02 ENCOUNTER — Encounter: Payer: Medicare HMO | Attending: Surgery | Admitting: Skilled Nursing Facility1

## 2021-12-02 ENCOUNTER — Encounter: Payer: Self-pay | Admitting: Skilled Nursing Facility1

## 2021-12-02 DIAGNOSIS — Z6841 Body Mass Index (BMI) 40.0 and over, adult: Secondary | ICD-10-CM | POA: Insufficient documentation

## 2021-12-02 DIAGNOSIS — L987 Excessive and redundant skin and subcutaneous tissue: Secondary | ICD-10-CM | POA: Diagnosis not present

## 2021-12-02 DIAGNOSIS — Z9884 Bariatric surgery status: Secondary | ICD-10-CM | POA: Insufficient documentation

## 2021-12-02 DIAGNOSIS — Z713 Dietary counseling and surveillance: Secondary | ICD-10-CM | POA: Insufficient documentation

## 2021-12-02 NOTE — Progress Notes (Signed)
Follow-up visit:  Post-Operative sleeve Surgery   Anthropometrics  Surgery date: 04/24/2020 Surgery type: sleeve Start weight at [facility]: 411.3 pounds Weight today: 258.5 pounds   Body Composition Scale 05/08/2020 06/19/2020 12/03/2020 03/05/2021 05/06/2021 12/02/2021  Current Body Weight 356.7 336.2 271.3 257.7 255.1 258.5  Total Body Fat % 52.6 51.6 47.5 46.4 46.1 48  Visceral Fat _0 Fat-Free Mass % 47.3 48.3 52.4 53.5 53.8 51.9   Total Body Water % 38.1 38.6 40.7 41.2 41.4 40.4  Muscle-Mass lbs 32.5 32.3 31.5 31.4 31.4 29.3  BMI 57.6 54.3 43.8 41.5 41.1 42.7  Body Fat Displacement               Torso  lbs 116.5 107.8 80 74.1 73 77         Left Leg  lbs 23.3 21.5 16 14.8 14.6 15.4         Right Leg  lbs 23.3 21.5 16 14.8 14.6 15.4         Left Arm  lbs 11.6 10.7 8 7.4 7.3 7.7         Right Arm   lbs 11.6 10.7 8 7.4 7.3 7.7    Pt states she watches her grandbabies Monday through Friday.  Pt states her energy is great and feels well mentally and physically.  Pt states she needs to lose 9 more pounds for a knee replacement.  Pt states she is going to have a consult with a plastic surgeon for skin removal.   24 hr recall: Breakfast: protein shake + lemon and ginger juice  Snack: grapes Lunch: ox tails in greens + mac n cheese + broccoli Snack: protein shake Dinner: ox tails in greens + mac n cheese + broccoli  Fluid intake: water 60-80 fluid ounces, sugar free jello, sometimes unsweet tea, sometimes orange juice, cranberry juice, water + flavorings  Medications: See List Supplementation: bari multi: celebrate chewable and calcium    LABs: calcium 10.6 (having taken her calcium supplement before her lab draw), iron saturation 13, Ferritin 333   Using straws: no Drinking while eating: no Having you been chewing well: yes Chewing/swallowing difficulties: no Changes in vision: no Changes to mood/headaches: no Hair loss/Cahnges to skin/Changes to nails:  no Any difficulty focusing or concentrating: no Sweating: no Dizziness/Lightheaded: no Palpitations: no  Carbonated beverages: no N/V/D/C/GAS: no Abdominal Pain: no Dumping syndrome: no  Recent physical activity:  Speed walking 2 miles 6 days a week, some [facility] and abs Monday through friday  Progress Towards Goal(s):  In Progress Teaching method utilized: Environmental health practitioner & Auditory  Demonstrated degree of understanding via: Teach Back  Readiness Level: Action Barriers to learning/adherence to lifestyle change: none identified   Teaching Method Utilized:  Visual Auditory Hands on  Demonstrated degree of understanding via:  Teach Back   Monitoring/Evaluation:  Dietary intake, exercise, and body weight.

## 2021-12-03 ENCOUNTER — Telehealth: Payer: Self-pay | Admitting: Emergency Medicine

## 2021-12-03 NOTE — Telephone Encounter (Signed)
RC to patient who has questions about TOC. Patient finished treatment for trich today. Patient advised to return for TOC in about 4-6 weeks.  Patient agreed and verbalized understanding.

## 2021-12-23 ENCOUNTER — Other Ambulatory Visit (HOSPITAL_COMMUNITY)
Admission: RE | Admit: 2021-12-23 | Discharge: 2021-12-23 | Disposition: A | Discharge status: Home / Self Care | Payer: Medicare HMO | Source: Ambulatory Visit | Attending: Obstetrics and Gynecology | Admitting: Obstetrics and Gynecology

## 2021-12-23 ENCOUNTER — Ambulatory Visit (INDEPENDENT_AMBULATORY_CARE_PROVIDER_SITE_OTHER): Payer: Medicaid Other | Admitting: General Practice

## 2021-12-23 VITALS — BP 118/77 | HR 78 | Ht 65.0 in | Wt 258.0 lb

## 2021-12-23 DIAGNOSIS — Z113 Encounter for screening for infections with a predominantly sexual mode of transmission: Secondary | ICD-10-CM | POA: Insufficient documentation

## 2021-12-23 NOTE — Progress Notes (Signed)
SUBJECTIVE:  61 y.o. female presents for TOC. Pt tested positive for Trichomonas and BV 11-25-2021. Denies abnormal vaginal bleeding or significant pelvic pain or fever. No UTI symptoms. Denies history of known exposure to STD.  Patient's last menstrual period was 09/25/2015 (approximate).  OBJECTIVE:  She appears well, afebrile. Urine dipstick: not done.  ASSESSMENT:  Vaginal Discharge  Vaginal Odor   PLAN:  GC, chlamydia, trichomonas, BVAG, CVAG probe sent to lab. Treatment: To be determined once lab results are received ROV prn if symptoms persist or worsen.

## 2021-12-24 LAB — CERVICOVAGINAL ANCILLARY ONLY
Bacterial Vaginitis (gardnerella): POSITIVE — AB
Candida Glabrata: NEGATIVE
Candida Vaginitis: POSITIVE — AB
Chlamydia: NEGATIVE
Comment: NEGATIVE
Comment: NEGATIVE
Comment: NEGATIVE
Comment: NEGATIVE
Comment: NEGATIVE
Comment: NORMAL
Neisseria Gonorrhea: NEGATIVE
Trichomonas: NEGATIVE

## 2021-12-25 ENCOUNTER — Other Ambulatory Visit: Payer: Self-pay | Admitting: *Deleted

## 2021-12-25 DIAGNOSIS — B3731 Acute candidiasis of vulva and vagina: Secondary | ICD-10-CM

## 2021-12-25 DIAGNOSIS — B9689 Other specified bacterial agents as the cause of diseases classified elsewhere: Secondary | ICD-10-CM

## 2021-12-25 MED ORDER — FLUCONAZOLE 150 MG PO TABS: 150.0000 mg | ORAL_TABLET | Freq: Once | ORAL | 0 refills | Status: AC

## 2021-12-25 MED ORDER — METRONIDAZOLE 500 MG PO TABS: 500.0000 mg | ORAL_TABLET | Freq: Two times a day (BID) | ORAL | 0 refills | Status: DC

## 2021-12-25 NOTE — Progress Notes (Signed)
TC from pt reporting +BV and yeast seen on recent vaginal swab results. RX Flagyl and Diflucan per protocol. Pt advised to take Flagyl course, then Diflucan. MyChart education sent on both infections.

## 2021-12-27 MED ORDER — FLUCONAZOLE 150 MG PO TABS: 150.0000 mg | ORAL_TABLET | Freq: Once | ORAL | 0 refills | Status: AC

## 2021-12-27 MED ORDER — METRONIDAZOLE 500 MG PO TABS: 500.0000 mg | ORAL_TABLET | Freq: Two times a day (BID) | ORAL | 0 refills | Status: DC

## 2021-12-27 NOTE — Addendum Note (Signed)
Addended by: Mora Bellman on: 12/27/2021 11:49 AM   Modules accepted: Orders

## 2021-12-31 DIAGNOSIS — Z79899 Other long term (current) drug therapy: Secondary | ICD-10-CM | POA: Diagnosis not present

## 2021-12-31 DIAGNOSIS — Z5181 Encounter for therapeutic drug level monitoring: Secondary | ICD-10-CM | POA: Diagnosis not present

## 2021-12-31 DIAGNOSIS — H0102B Squamous blepharitis left eye, upper and lower eyelids: Secondary | ICD-10-CM | POA: Diagnosis not present

## 2021-12-31 DIAGNOSIS — Z961 Presence of intraocular lens: Secondary | ICD-10-CM | POA: Diagnosis not present

## 2021-12-31 DIAGNOSIS — H35033 Hypertensive retinopathy, bilateral: Secondary | ICD-10-CM | POA: Diagnosis not present

## 2021-12-31 DIAGNOSIS — H401232 Low-tension glaucoma, bilateral, moderate stage: Secondary | ICD-10-CM | POA: Diagnosis not present

## 2021-12-31 DIAGNOSIS — H5010 Unspecified exotropia: Secondary | ICD-10-CM | POA: Diagnosis not present

## 2021-12-31 DIAGNOSIS — G894 Chronic pain syndrome: Secondary | ICD-10-CM | POA: Diagnosis not present

## 2021-12-31 DIAGNOSIS — H0102A Squamous blepharitis right eye, upper and lower eyelids: Secondary | ICD-10-CM | POA: Diagnosis not present

## 2022-01-09 ENCOUNTER — Encounter: Payer: Self-pay | Admitting: Family Medicine

## 2022-01-09 ENCOUNTER — Ambulatory Visit (INDEPENDENT_AMBULATORY_CARE_PROVIDER_SITE_OTHER): Payer: Medicare HMO | Admitting: Family Medicine

## 2022-01-09 VITALS — BP 126/84 | HR 60 | Temp 97.5°F | Ht 65.0 in | Wt 262.0 lb

## 2022-01-09 DIAGNOSIS — H409 Unspecified glaucoma: Secondary | ICD-10-CM

## 2022-01-09 DIAGNOSIS — Z9884 Bariatric surgery status: Secondary | ICD-10-CM

## 2022-01-09 DIAGNOSIS — E785 Hyperlipidemia, unspecified: Secondary | ICD-10-CM | POA: Diagnosis not present

## 2022-01-09 DIAGNOSIS — H04123 Dry eye syndrome of bilateral lacrimal glands: Secondary | ICD-10-CM | POA: Diagnosis not present

## 2022-01-09 DIAGNOSIS — F32A Depression, unspecified: Secondary | ICD-10-CM

## 2022-01-09 DIAGNOSIS — Z86718 Personal history of other venous thrombosis and embolism: Secondary | ICD-10-CM

## 2022-01-09 DIAGNOSIS — G894 Chronic pain syndrome: Secondary | ICD-10-CM | POA: Diagnosis not present

## 2022-01-09 NOTE — Progress Notes (Signed)
New Patient Office Visit  Subjective    Patient ID: Carla Sloan, female    DOB: [birth year]  Age: 61 y.o. MRN: [id number]  CC:  Chief Complaint  Patient presents with   Establish Care    No concerns    HPI Carla Sloan presents to establish care Previous PCP- last there 6 months.   Other providers:  [facility]/GYN [facility]  Orthopedist Dietician  Pain - Dr. Nelva Bush  Eyes-  Psychiatrist - Beverly Sessions   Gastric bypass last year.   DVT- was supposed to be on Xarelto x 3 months. She has been taking it longer. Wants to stop.    On statin therapy. Needs refill.   Denies fever, chills, dizziness, chest pain, palpitations, shortness of breath, abdominal pain, N/V/D, urinary symptoms, LE edema.    Outpatient Encounter Medications as of 01/09/2022  Medication Sig   acetaminophen (TYLENOL) 500 MG tablet Take 1 tablet (500 mg total) by mouth every 6 (six) hours as needed.   azelastine (OPTIVAR) 0.05 % ophthalmic solution Place 1 drop into both eyes 2 (two) times daily.   bimatoprost (LUMIGAN) 0.03 % ophthalmic solution Place 1 drop into both eyes at bedtime.   brimonidine-timolol (COMBIGAN) 0.2-0.5 % ophthalmic solution Place 1 drop into both eyes every 12 (twelve) hours.   calcium citrate-vitamin D (CELEBRATE CALCIUM CITRATE) 500-500 MG-UNIT chewable tablet Chew 1 tablet by mouth every evening.   CEQUA 0.09 % SOLN Place 1 drop into both eyes 2 (two) times daily.   citalopram (CELEXA) 40 MG tablet Take 40 mg by mouth daily.   cyclobenzaprine (FLEXERIL) 5 MG tablet Take 5 mg by mouth 3 (three) times daily.   diclofenac Sodium (VOLTAREN) 1 % GEL Apply 4 g topically 4 (four) times daily.   Elastic Bandages & Supports (WRIST SPLINT/COCK-UP/LEFT L) MISC Wear nightly and with exacerbating activities.   Elastic Bandages & Supports (WRIST SPLINT/COCK-UP/RIGHT L) MISC Wear nightly and with exacerbating activities.   ergocalciferol (VITAMIN D2) 1.25 MG (50000 UT) capsule Take  50,000 Units by mouth once a week.   ferrous sulfate 324 MG TBEC Take 324 mg by mouth daily with breakfast.   hydrOXYzine (ATARAX/VISTARIL) 25 MG tablet Take 1-2 tablets (25-50 mg total) by mouth at bedtime as needed. Take for insomnia. (Patient taking differently: Take 100 mg by mouth at bedtime. Take for insomnia.)   lidocaine (LIDODERM) 5 % Place 1 patch onto the skin daily. Remove & Discard patch within 12 hours or as directed by MD   Lurasidone HCl (LATUDA) 60 MG TABS Take 60 mg by mouth at bedtime.   Multiple Vitamins-Minerals (CELEBRATE MULTI-COMPLETE 45) CHEW Chew 1 each by mouth daily.   naproxen (NAPROSYN) 500 MG tablet Take 1 tablet (500 mg total) by mouth 2 (two) times daily.   orphenadrine (NORFLEX) 100 MG tablet Take 1 tablet (100 mg total) by mouth 2 (two) times daily.   oxyCODONE-acetaminophen (PERCOCET) 10-325 MG tablet Take 1 tablet by mouth twice a day as needed.   pravastatin (PRAVACHOL) 20 MG tablet Take 20 mg by mouth daily.   traMADol (ULTRAM) 50 MG tablet Take 1 tablet (50 mg total) by mouth every 6 (six) hours as needed (pain).   [DISCONTINUED] amLODipine (NORVASC) 5 MG tablet Take 1 tablet (5 mg total) by mouth daily.   [DISCONTINUED] gabapentin (NEURONTIN) 800 MG tablet Take 800-1,600 mg by mouth 2 (two) times daily. Take 800 mg in the morning and 1600 mg at bedtime   [DISCONTINUED] hydrochlorothiazide (HYDRODIURIL) 25  MG tablet Take 1 tablet (25 mg total) by mouth daily.   [DISCONTINUED] metroNIDAZOLE (FLAGYL) 500 MG tablet Take 1 tablet (500 mg total) by mouth 2 (two) times daily.   [DISCONTINUED] metroNIDAZOLE (FLAGYL) 500 MG tablet Take 1 tablet (500 mg total) by mouth 2 (two) times daily.   [DISCONTINUED] metroNIDAZOLE (FLAGYL) 500 MG tablet Take 1 tablet (500 mg total) by mouth 2 (two) times daily.   [DISCONTINUED] metroNIDAZOLE (FLAGYL) 500 MG tablet Take 1 tablet (500 mg total) by mouth 2 (two) times daily.   [DISCONTINUED] nitrofurantoin,  macrocrystal-monohydrate, (MACROBID) 100 MG capsule Take 1 capsule (100 mg total) by mouth 2 (two) times daily.   [DISCONTINUED] ondansetron (ZOFRAN-ODT) 4 MG disintegrating tablet Take 1 tablet (4 mg total) by mouth every 6 (six) hours as needed for nausea or vomiting.   [DISCONTINUED] pantoprazole (PROTONIX) 40 MG tablet Take 1 tablet (40 mg total) by mouth daily.   [DISCONTINUED] RIVAROXABAN (XARELTO) VTE STARTER PACK (15 & 20 MG) Follow package directions: Take one '15mg'$  tablet by mouth twice a day. On day 22, switch to one '20mg'$  tablet once a day. Take with food.   No facility-administered encounter medications on file as of 01/09/2022.    Past Medical History:  Diagnosis Date   Arthritis    Depression    Hypertension    Muscle spasms of lower extremity    and back   Sleep apnea     Past Surgical History:  Procedure Laterality Date   COLONOSCOPY WITH PROPOFOL N/A 09/24/2012   Procedure: COLONOSCOPY WITH PROPOFOL;  Surgeon: Jeryl Columbia, MD;  Location: [facility];  Service: Endoscopy;  Laterality: N/A;   COLONOSCOPY WITH PROPOFOL N/A 05/19/2016   Procedure: COLONOSCOPY WITH PROPOFOL;  Surgeon: Clarene Essex, MD;  Location: [facility] ENDOSCOPY;  Service: Endoscopy;  Laterality: N/A;   LAPAROSCOPIC GASTRIC SLEEVE RESECTION N/A 04/24/2020   Procedure: LAPAROSCOPIC GASTRIC SLEEVE RESECTION;  Surgeon: Clovis Riley, MD;  Location: [facility];  Service: General;  Laterality: N/A;   NO PAST SURGERIES     TUBAL LIGATION     UPPER GI ENDOSCOPY N/A 04/24/2020   Procedure: UPPER GI ENDOSCOPY;  Surgeon: Clovis Riley, MD;  Location: [facility];  Service: General;  Laterality: N/A;    Family History  Problem Relation Age of Onset   Hypertension Mother    Diabetes Mother    Breast cancer Mother    Diabetes Father    Hypertension Father     Social History   Socioeconomic History   Marital status: Single    Spouse name: Not on file   Number of children: Not on file   Years of education: Not on file    Highest education level: Not on file  Occupational History   Not on file  Tobacco Use   Smoking status: Never   Smokeless tobacco: Never  Vaping Use   Vaping Use: Never used  Substance and Sexual Activity   Alcohol use: No   Drug use: No   Sexual activity: Yes    Partners: Male    Birth control/protection: None  Other Topics Concern   Not on file  Social History Narrative   Not on file   Social Determinants of Health   Financial Resource Strain: Not on file  Food Insecurity: Not on file  Transportation Needs: Not on file  Physical Activity: Not on file  Stress: Not on file  Social Connections: Not on file  Intimate Partner Violence: Not on file  ROS      Objective    BP 126/84 (BP Location: Left Arm, Patient Position: Sitting, Cuff Size: Large)   Pulse 60   Temp (!) 97.5 F (36.4 C) (Temporal)   Ht '5\' 5"'$  (1.651 m)   Wt 262 lb (118.8 kg)   LMP 09/25/2015 (Approximate)   BMI 43.60 kg/m   Physical Exam Constitutional:      General: She is not in acute distress.    Appearance: She is not ill-appearing.  Cardiovascular:     Rate and Rhythm: Normal rate.  Pulmonary:     Effort: Pulmonary effort is normal.  Neurological:     General: No focal deficit present.     Mental Status: She is alert and oriented to person, place, and time.  Psychiatric:        Mood and Affect: Mood normal.        Behavior: Behavior normal.         Assessment & Plan:   Problem List Items Addressed This Visit       Other   Chronic pain syndrome   Other Visit Diagnoses     Hyperlipidemia, unspecified hyperlipidemia type    -  Primary   Relevant Orders   Lipid panel   Depression, unspecified depression type       History of DVT (deep vein thrombosis)       History of gastric bypass       Glaucoma of both eyes, unspecified glaucoma type       Bilateral dry eyes       Severe obesity (BMI >= 40) (HCC)          New to the practice today. Reports being in her usual  state of health.  She has several specialists. Reviewed notes and results.  Lipid panel checked. Refill statin pending result.    Return in about 6 months (around 07/10/2022).   Harland Dingwall, NP-C

## 2022-01-10 ENCOUNTER — Other Ambulatory Visit: Payer: Self-pay | Admitting: Family Medicine

## 2022-01-10 DIAGNOSIS — E785 Hyperlipidemia, unspecified: Secondary | ICD-10-CM

## 2022-01-10 LAB — LIPID PANEL
Cholesterol: 163 mg/dL (ref 0–200)
HDL: 62 mg/dL (ref >39.00)
LDL Cholesterol: 91 mg/dL (ref 0–99)
NonHDL: 100.89
Total CHOL/HDL Ratio: 3
Triglycerides: 51 mg/dL (ref 0.0–149.0)
VLDL: 10.2 mg/dL (ref 0.0–40.0)

## 2022-01-10 MED ORDER — PRAVASTATIN SODIUM 20 MG PO TABS: 20.0000 mg | ORAL_TABLET | Freq: Every day | ORAL | 1 refills | Status: DC

## 2022-01-21 ENCOUNTER — Ambulatory Visit (INDEPENDENT_AMBULATORY_CARE_PROVIDER_SITE_OTHER): Payer: Medicare HMO | Admitting: Plastic Surgery

## 2022-01-21 ENCOUNTER — Encounter: Payer: Self-pay | Admitting: Plastic Surgery

## 2022-01-21 VITALS — BP 151/89 | HR 76 | Ht 65.0 in | Wt 264.8 lb

## 2022-01-21 DIAGNOSIS — M549 Dorsalgia, unspecified: Secondary | ICD-10-CM

## 2022-01-21 DIAGNOSIS — M542 Cervicalgia: Secondary | ICD-10-CM

## 2022-01-21 DIAGNOSIS — R21 Rash and other nonspecific skin eruption: Secondary | ICD-10-CM | POA: Diagnosis not present

## 2022-01-21 DIAGNOSIS — M793 Panniculitis, unspecified: Secondary | ICD-10-CM | POA: Diagnosis not present

## 2022-01-21 NOTE — Progress Notes (Signed)
Patient ID: Carla Sloan, female    DOB: 09/16/1960, 61 y.o.   MRN: [id number]   Chief Complaint  Patient presents with   Advice Only    The patient is a 61 year old female here for evaluation of her abdomen.  She came in wanting all areas looked at but we did focus on the abdomen and agreed to save the arms and the legs for later.  She has had skin breakdown and rashes on her repeated basis.  She has tried creams and lotions with no long-term benefits.  Her starting weight was 418 pounds.  She underwent gastric bypass in March 2021 by Dr. Windle Guard.  She is now down to 264 pounds.  She is 5 feet 5 inches tall.  She has done an amazing job at weight reduction and would like to lose a little bit more.  She is not a smoker and does not have diabetes.  She has been really good about trying to get her protein and eat healthy.  Her pannus is pendulous and hangs below her mons.    Review of Systems  Constitutional: Negative.   HENT: Negative.    Eyes: Negative.   Respiratory: Negative.  Negative for chest tightness and shortness of breath.   Cardiovascular: Negative.   Gastrointestinal: Negative.   Endocrine: Negative.   Genitourinary: Negative.   Musculoskeletal:  Positive for back pain, joint swelling and neck pain.  Skin:  Positive for rash.    Past Medical History:  Diagnosis Date   Arthritis    Depression    Hypertension    Muscle spasms of lower extremity    and back   Sleep apnea     Past Surgical History:  Procedure Laterality Date   COLONOSCOPY WITH PROPOFOL N/A 09/24/2012   Procedure: COLONOSCOPY WITH PROPOFOL;  Surgeon: Jeryl Columbia, MD;  Location: [facility];  Service: Endoscopy;  Laterality: N/A;   COLONOSCOPY WITH PROPOFOL N/A 05/19/2016   Procedure: COLONOSCOPY WITH PROPOFOL;  Surgeon: Clarene Essex, MD;  Location: [facility] ENDOSCOPY;  Service: Endoscopy;  Laterality: N/A;   LAPAROSCOPIC GASTRIC SLEEVE RESECTION N/A 04/24/2020   Procedure: LAPAROSCOPIC GASTRIC  SLEEVE RESECTION;  Surgeon: Clovis Riley, MD;  Location: [facility];  Service: General;  Laterality: N/A;   NO PAST SURGERIES     TUBAL LIGATION     UPPER GI ENDOSCOPY N/A 04/24/2020   Procedure: UPPER GI ENDOSCOPY;  Surgeon: Clovis Riley, MD;  Location: [facility];  Service: General;  Laterality: N/A;      Current Outpatient Medications:    acetaminophen (TYLENOL) 500 MG tablet, Take 1 tablet (500 mg total) by mouth every 6 (six) hours as needed., Disp: 30 tablet, Rfl: 0   azelastine (OPTIVAR) 0.05 % ophthalmic solution, Place 1 drop into both eyes 2 (two) times daily., Disp: , Rfl:    bimatoprost (LUMIGAN) 0.03 % ophthalmic solution, Place 1 drop into both eyes at bedtime., Disp: , Rfl:    brimonidine-timolol (COMBIGAN) 0.2-0.5 % ophthalmic solution, Place 1 drop into both eyes every 12 (twelve) hours., Disp: , Rfl:    calcium citrate-vitamin D (CELEBRATE CALCIUM CITRATE) 500-500 MG-UNIT chewable tablet, Chew 1 tablet by mouth every evening., Disp: , Rfl:    CEQUA 0.09 % SOLN, Place 1 drop into both eyes 2 (two) times daily., Disp: , Rfl:    citalopram (CELEXA) 40 MG tablet, Take 40 mg by mouth daily., Disp: , Rfl:    cyclobenzaprine (FLEXERIL) 10 MG tablet, Take 10 mg  by mouth 3 (three) times daily., Disp: , Rfl:    diclofenac Sodium (VOLTAREN) 1 % GEL, Apply 4 g topically 4 (four) times daily., Disp: 100 g, Rfl: 1   Elastic Bandages & Supports (WRIST SPLINT/COCK-UP/LEFT L) MISC, Wear nightly and with exacerbating activities., Disp: 1 each, Rfl: 0   Elastic Bandages & Supports (WRIST SPLINT/COCK-UP/RIGHT L) MISC, Wear nightly and with exacerbating activities., Disp: 1 each, Rfl: 0   ergocalciferol (VITAMIN D2) 1.25 MG (50000 UT) capsule, Take 50,000 Units by mouth once a week., Disp: , Rfl:    ferrous sulfate 324 MG TBEC, Take 324 mg by mouth daily with breakfast., Disp: , Rfl:    hydrOXYzine (ATARAX/VISTARIL) 25 MG tablet, Take 1-2 tablets (25-50 mg total) by mouth at bedtime as needed.  Take for insomnia. (Patient taking differently: Take 100 mg by mouth at bedtime. Take for insomnia.), Disp: 60 tablet, Rfl: 0   lidocaine (LIDODERM) 5 %, Place 1 patch onto the skin daily. Remove & Discard patch within 12 hours or as directed by MD, Disp: 30 patch, Rfl: 0   Lurasidone HCl (LATUDA) 60 MG TABS, Take 60 mg by mouth at bedtime., Disp: , Rfl:    Multiple Vitamins-Minerals (CELEBRATE MULTI-COMPLETE 45) CHEW, Chew 1 each by mouth daily., Disp: , Rfl:    orphenadrine (NORFLEX) 100 MG tablet, Take 1 tablet (100 mg total) by mouth 2 (two) times daily., Disp: 30 tablet, Rfl: 0   oxyCODONE-acetaminophen (PERCOCET) 10-325 MG tablet, Take 1 tablet by mouth twice a day as needed., Disp: 60 tablet, Rfl: 0   pravastatin (PRAVACHOL) 20 MG tablet, Take 1 tablet (20 mg total) by mouth daily., Disp: 90 tablet, Rfl: 1   Objective:   Vitals:   01/21/22 1417  BP: (!) 151/89  Pulse: 76  SpO2: 97%    Physical Exam Vitals and nursing note reviewed.  Constitutional:      Appearance: Normal appearance.  HENT:     Head: Normocephalic and atraumatic.  Cardiovascular:     Rate and Rhythm: Normal rate.     Pulses: Normal pulses.  Pulmonary:     Effort: Pulmonary effort is normal.  Abdominal:     General: There is no distension.     Palpations: Abdomen is soft. There is no mass.     Tenderness: There is no abdominal tenderness. There is no guarding.     Hernia: No hernia is present.  Musculoskeletal:        General: No swelling or deformity.  Skin:    General: Skin is warm.     Capillary Refill: Capillary refill takes less than 2 seconds.     Coloration: Skin is not jaundiced.     Findings: Rash present. No bruising or lesion.  Neurological:     Mental Status: She is alert and oriented to person, place, and time.  Psychiatric:        Mood and Affect: Mood normal.        Behavior: Behavior normal.        Thought Content: Thought content normal.        Judgment: Judgment normal.      Assessment & Plan:  Panniculitis  The patient is a good candidate for a panniculectomy.  I explained to her the difference between a panniculectomy and an abdominoplasty.  She states that she understands and wants to move forward with the panniculectomy.  I do not feel any hernia and therefore we will not get a general surgery consult.  I  will request 1 night stay in the hospital.  Plan for panniculectomy with possible liposuction.  Novelty, DO

## 2022-01-22 ENCOUNTER — Telehealth: Payer: Self-pay | Admitting: Skilled Nursing Facility1

## 2022-01-22 ENCOUNTER — Telehealth: Payer: Self-pay | Admitting: Plastic Surgery

## 2022-01-22 NOTE — Telephone Encounter (Signed)
Pt called stating the plastic surgeon advised her tos top eating carbs and sugar before her procedure. Pt asks what is a carb and sugar?   Dietitian was confused so advised pt to get clarification from her doctor. Pt states she will call back with a more specific question.

## 2022-01-22 NOTE — Telephone Encounter (Signed)
Pt called in to make sure she knows when she should start the decreasing of carbs and sugars.  She met with Dr. Marla Roe yesterday and it was sent to scheduler for surgery. She would like a call back to confirm if she should start now.

## 2022-01-23 ENCOUNTER — Ambulatory Visit (INDEPENDENT_AMBULATORY_CARE_PROVIDER_SITE_OTHER): Payer: Self-pay | Admitting: Surgical

## 2022-01-23 DIAGNOSIS — M793 Panniculitis, unspecified: Secondary | ICD-10-CM

## 2022-01-23 NOTE — Telephone Encounter (Signed)
She can start now, a healthy diet will help prep her for surgery thanks

## 2022-01-23 NOTE — Progress Notes (Signed)
Patient is a 61 year old female who saw Dr. Marla Roe for evaluation of her abdomen on 01/21/2022.  They discussed panniculectomy.  They also discussed diet modifications to improve her presurgery health and improve postoperative healing and to decrease risk of complications.  She had some questions about the diet, specifically recommendations on when to start the diet and different options of carbs that she should avoid.  The patient gave consent to have this visit done by telemedicine / virtual visit, two identifiers were used to identify patient. This is also consent for access the chart and treat the patient via this visit. The patient is located in [place].  I, the provider, am at the office.  We spent 3 minutes together for the visit.  Joined by telephone.   I discussed with the patient that I recommend starting the diet as soon as possible, we discussed that this would improve her presurgery health and improve her postsurgery recovery and decrease risk of complications such as delayed wound healing, infections.  We discussed recommendations for decreasing carbs and examples of carbs to avoid.  We also discussed avoiding sugary foods.  Patient was understanding, all of her questions were answered to her content.  I recommend she call with any questions or concerns.  We discussed that our surgical scheduling team would reach out to her after submitting to insurance and once we received any updates from insurance.

## 2022-01-30 DIAGNOSIS — F333 Major depressive disorder, recurrent, severe with psychotic symptoms: Secondary | ICD-10-CM | POA: Diagnosis not present

## 2022-02-25 ENCOUNTER — Telehealth: Payer: Self-pay

## 2022-02-25 NOTE — Telephone Encounter (Signed)
Could not upload case to [employer] due to technical difficulties.  Faxed to [phone] Tracking ID [id number]

## 2022-03-05 ENCOUNTER — Telehealth: Payer: Self-pay | Admitting: *Deleted

## 2022-03-05 ENCOUNTER — Telehealth: Payer: Self-pay

## 2022-03-05 NOTE — Telephone Encounter (Signed)
Received approval for CPT 30141 [id number].  Called patient and she is aware.   Chart is in Heather's office-ready  to schedule.

## 2022-03-05 NOTE — Telephone Encounter (Signed)
Spoke with patient to schedule sx and related appts  

## 2022-03-07 ENCOUNTER — Telehealth: Payer: Self-pay

## 2022-03-07 ENCOUNTER — Encounter: Payer: Self-pay | Admitting: Surgical

## 2022-03-07 ENCOUNTER — Ambulatory Visit (INDEPENDENT_AMBULATORY_CARE_PROVIDER_SITE_OTHER): Payer: Medicaid Other | Admitting: Surgical

## 2022-03-07 VITALS — BP 132/85 | HR 104 | Ht 65.0 in | Wt 266.6 lb

## 2022-03-07 DIAGNOSIS — M793 Panniculitis, unspecified: Secondary | ICD-10-CM

## 2022-03-07 MED ORDER — ONDANSETRON HCL 4 MG PO TABS: 4.0000 mg | ORAL_TABLET | Freq: Three times a day (TID) | ORAL | 0 refills | Status: AC | PRN

## 2022-03-07 MED ORDER — CEPHALEXIN 500 MG PO CAPS: 500.0000 mg | ORAL_CAPSULE | Freq: Four times a day (QID) | ORAL | 0 refills | Status: AC

## 2022-03-07 NOTE — Telephone Encounter (Signed)
Faxed PO pain management recommendation to Dr. Suella Broad, MD

## 2022-03-07 NOTE — Progress Notes (Signed)
Patient ID: Carla Sloan, female    DOB: [birth year], 63 y.o.   MRN: [id number]  Chief Complaint  Patient presents with   Pre-op Exam    No diagnosis found.   History of Present Illness: Carla Sloan is a 62 y.o.  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  She presents for preoperative evaluation for upcoming procedure, panniculectomy with liposuction, scheduled for 03/19/2022 with Dr. Marla Roe.  The patient has not had problems with anesthesia.  Patient reports a DVT of her left lower extremity in February 2023 which was diagnosed in the ED.  She reports her sister has a history of DVT as well.  No other family history of VTE or DVT.  She reports she is not currently on any blood thinners.  She denies any cardiac disease.  She reports that her sleep apnea has resolved with weight loss.  Summary of Previous Visit: Patient with history of gastric bypass, went from 418 pounds to 264 pounds.  She has had skin breakdown and rashes of her pannus fold.  She had gastric bypass in 2021.  She is not a smoker, not a diabetic.  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  PMH Significant for: DVT of left lower extremity (left soleal vein and left gastrocnemius vein), gastric bypass surgery with significant weight loss, sleep apnea-resolved due to weight loss.  Hypertension-resolved due to weight loss.  She reports she has been feeling well lately, no recent changes in her health.  She denies any recent cardiac or pulmonary issues.  She reports that she is ready for surgery and feels well-informed.  She is on chronic Percocet 10 3 times per day which is prescribed by [facility] at [facility].   Past Medical History: Allergies: No Known Allergies  Current Medications:  Current Outpatient Medications:    acetaminophen (TYLENOL) 500 MG tablet, Take 1 tablet (500 mg total) by mouth every 6 (six) hours as needed., Disp:  30 tablet, Rfl: 0   azelastine (OPTIVAR) 0.05 % ophthalmic solution, Place 1 drop into both eyes 2 (two) times daily., Disp: , Rfl:    bimatoprost (LUMIGAN) 0.03 % ophthalmic solution, Place 1 drop into both eyes at bedtime., Disp: , Rfl:    brimonidine-timolol (COMBIGAN) 0.2-0.5 % ophthalmic solution, Place 1 drop into both eyes every 12 (twelve) hours., Disp: , Rfl:    calcium citrate-vitamin D (CELEBRATE CALCIUM CITRATE) 500-500 MG-UNIT chewable tablet, Chew 1 tablet by mouth every evening., Disp: , Rfl:    CEQUA 0.09 % SOLN, Place 1 drop into both eyes 2 (two) times daily., Disp: , Rfl:    citalopram (CELEXA) 40 MG tablet, Take 40 mg by mouth daily., Disp: , Rfl:    cyclobenzaprine (FLEXERIL) 10 MG tablet, Take 10 mg by mouth 3 (three) times daily., Disp: , Rfl:    diclofenac Sodium (VOLTAREN) 1 % GEL, Apply 4 g topically 4 (four) times daily., Disp: 100 g, Rfl: 1   Elastic Bandages & Supports (WRIST SPLINT/COCK-UP/LEFT L) MISC, Wear nightly and with exacerbating activities., Disp: 1 each, Rfl: 0   Elastic Bandages & Supports (WRIST SPLINT/COCK-UP/RIGHT L) MISC, Wear nightly and with exacerbating activities., Disp: 1 each, Rfl: 0   ergocalciferol (VITAMIN D2) 1.25 MG (50000 UT) capsule, Take 50,000 Units by mouth once a week., Disp: , Rfl:    ferrous sulfate 324 MG TBEC, Take 324 mg by mouth daily with breakfast., Disp: , Rfl:    hydrOXYzine (ATARAX/VISTARIL) 25  MG tablet, Take 1-2 tablets (25-50 mg total) by mouth at bedtime as needed. Take for insomnia. (Patient taking differently: Take 100 mg by mouth at bedtime. Take for insomnia.), Disp: 60 tablet, Rfl: 0   lidocaine (LIDODERM) 5 %, Place 1 patch onto the skin daily. Remove & Discard patch within 12 hours or as directed by MD, Disp: 30 patch, Rfl: 0   Lurasidone HCl (LATUDA) 60 MG TABS, Take 60 mg by mouth at bedtime., Disp: , Rfl:    Multiple Vitamins-Minerals (CELEBRATE MULTI-COMPLETE 45) CHEW, Chew 1 each by mouth daily., Disp: , Rfl:     orphenadrine (NORFLEX) 100 MG tablet, Take 1 tablet (100 mg total) by mouth 2 (two) times daily., Disp: 30 tablet, Rfl: 0   oxyCODONE-acetaminophen (PERCOCET) 10-325 MG tablet, Take 1 tablet by mouth twice a day as needed., Disp: 60 tablet, Rfl: 0   pravastatin (PRAVACHOL) 20 MG tablet, Take 1 tablet (20 mg total) by mouth daily., Disp: 90 tablet, Rfl: 1  Past Medical Problems: Past Medical History:  Diagnosis Date   Arthritis    Depression    Hypertension    Muscle spasms of lower extremity    and back   Sleep apnea     Past Surgical History: Past Surgical History:  Procedure Laterality Date   COLONOSCOPY WITH PROPOFOL N/A 09/24/2012   Procedure: COLONOSCOPY WITH PROPOFOL;  Surgeon: Jeryl Columbia, MD;  Location: [facility];  Service: Endoscopy;  Laterality: N/A;   COLONOSCOPY WITH PROPOFOL N/A 05/19/2016   Procedure: COLONOSCOPY WITH PROPOFOL;  Surgeon: Clarene Essex, MD;  [facility] ENDOSCOPY;  Service: Endoscopy;  Laterality: N/A;   LAPAROSCOPIC GASTRIC SLEEVE RESECTION N/A 04/24/2020   Procedure: LAPAROSCOPIC GASTRIC SLEEVE RESECTION;  Surgeon: Clovis Riley, MD;  Location: [facility];  Service: General;  Laterality: N/A;   NO PAST SURGERIES     TUBAL LIGATION     UPPER GI ENDOSCOPY N/A 04/24/2020   Procedure: UPPER GI ENDOSCOPY;  Surgeon: Clovis Riley, MD;  Location: [facility];  Service: General;  Laterality: N/A;    Social History: Social History   Socioeconomic History   Marital status: Single    Spouse name: Not on file   Number of children: Not on file   Years of education: Not on file   Highest education level: Not on file  Occupational History   Not on file  Tobacco Use   Smoking status: Never   Smokeless tobacco: Never  Vaping Use   Vaping Use: Never used  Substance and Sexual Activity   Alcohol use: No   Drug use: No   Sexual activity: Yes    Partners: Male    Birth control/protection: None  Other Topics Concern   Not on file  Social History Narrative    Not on file   Social Determinants of Health   Financial Resource Strain: Not on file  Food Insecurity: Not on file  Transportation Needs: Not on file  Physical Activity: Not on file  Stress: Not on file  Social Connections: Not on file  Intimate Partner Violence: Not on file    Family History: Family History  Problem Relation Age of Onset   Hypertension Mother    Diabetes Mother    Breast cancer Mother    Diabetes Father    Hypertension Father     Review of Systems: Review of Systems  Constitutional: Negative.   Respiratory: Negative.    Cardiovascular: Negative.   Gastrointestinal: Negative.   Musculoskeletal:  Positive for  back pain and joint pain.  Neurological: Negative.     Physical Exam: Vital Signs BP 132/85 (BP Location: Right Arm, Patient Position: Sitting, Cuff Size: Large)   Pulse (!) 104   Ht '5\' 5"'$  (1.651 m)   Wt 266 lb 9.6 oz (120.9 kg)   LMP 09/25/2015 (Approximate)   SpO2 97%   BMI 44.36 kg/m   Physical Exam Constitutional:      General: Not in acute distress.    Appearance: Normal appearance. Not ill-appearing.  HENT:     Head: Normocephalic and atraumatic.  Eyes:     Pupils: Pupils are equal, round Neck:     Musculoskeletal: Normal range of motion.  Cardiovascular:     Rate and Rhythm: Normal rate    Pulses: Normal pulses.  Pulmonary:     Effort: Pulmonary effort is normal. No respiratory distress.  Abdominal:     General: Abdomen is flat. There is no distension.  Musculoskeletal: Normal range of motion.  Skin:    General: Skin is warm and dry.     Findings: No erythema or rash.  Neurological:     General: No focal deficit present.     Mental Status: Alert and oriented to person, place, and time. Mental status is at baseline.     Motor: No weakness.  Psychiatric:        Mood and Affect: Mood normal.        Behavior: Behavior normal.    Assessment/Plan: The patient is scheduled for panniculectomy with liposuction with Dr.  Marla Roe.  Risks, benefits, and alternatives of procedure discussed, questions answered and consent obtained.    Smoking Status: Non-smoker; Counseling Given? N/A  Caprini Score: 12, high; Risk Factors include: History of DVT in left lower extremity February 2023, sister with DVT, BMI > 40, and length of planned surgery. Recommendation for mechanical and pharmacological prophylaxis. Encourage early ambulation.  Will discuss need for postop Lovenox for 1 to 2 weeks with Dr. Marla Roe.  Pictures obtained: '@consult'$   Post-op Rx sent to pharmacy:  Zofran, Keflex. Patient is on chronic Percocet 10 prescribed by Suella Broad, MD and Jabier Mutton, NP at [facility].  I discussed with the patient that no medications will be prescribed postoperatively for patient for pain mostly received clearance from [facility].  I instructed the patient to please reach out to her pain management provider for guidance and that we would also send a clearance to them.  Patient was provided with the General Surgical Risk consent document and Pain Medication Agreement prior to their appointment.  They had adequate time to read through the risk consent documents and Pain Medication Agreement. We also discussed them in person together during this preop appointment. All of their questions were answered to their satisfaction.  Recommended calling if they have any further questions.  Risk consent form and Pain Medication Agreement to be scanned into patient's chart.  The risk that can be encountered for this procedure were discussed and include the following but not limited to these: asymmetry, fluid accumulation, firmness of the tissue, skin loss, decrease or no sensation, fat necrosis, bleeding, infection, healing delay.  Deep vein thrombosis, cardiac and pulmonary complications are risks to any procedure.  There are risks of anesthesia, changes to skin sensation and injury to nerves or blood vessels.  The muscle can be  temporarily or permanently injured.  You may have an allergic reaction to tape, suture, glue, blood products which can result in skin discoloration, swelling, pain, skin lesions, poor  healing.  Any of these can lead to the need for revisonal surgery or stage procedures.  Weight gain and weigh loss can also effect the long term appearance. The results are not guaranteed to last a lifetime.  Future surgery may be required.    The risks that can be encountered with and after liposuction were discussed and include the following but no limited to these:  Asymmetry, fluid accumulation, firmness of the area, fat necrosis with death of fat tissue, bleeding, infection, delayed healing, anesthesia risks, skin sensation changes, injury to structures including nerves, blood vessels, and muscles which may be temporary or permanent, allergies to tape, suture materials and glues, blood products, topical preparations or injected agents, skin and contour irregularities, skin discoloration and swelling, deep vein thrombosis, cardiac and pulmonary complications, pain, which may persist, persistent pain, recurrence of the lesion, poor healing of the incision, possible need for revisional surgery or staged procedures. Thiere can also be persistent swelling, poor wound healing, rippling or loose skin, worsening of cellulite, swelling, and thermal burn or heat injury from ultrasound with the ultrasound-assisted lipoplasty technique. Any change in weight fluctuations can alter the outcome.    Electronically signed by: Carola Rhine Marquies Wanat, PA-C 03/07/2022 11:03 AM

## 2022-03-07 NOTE — H&P (View-Only) (Signed)
Patient ID: Carla Sloan, female    DOB: 1960-08-30, 62 y.o.   MRN: [id number]  Chief Complaint  Patient presents with   Pre-op Exam    No diagnosis found.   History of Present Illness: Carla Sloan is a 62 y.o.  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  She presents for preoperative evaluation for upcoming procedure, panniculectomy with liposuction, scheduled for 03/19/2022 with Dr. Marla Roe.  The patient has not had problems with anesthesia.  Patient reports a DVT of her left lower extremity in February 2023 which was diagnosed in the ED.  She reports her sister has a history of DVT as well.  No other family history of VTE or DVT.  She reports she is not currently on any blood thinners.  She denies any cardiac disease.  She reports that her sleep apnea has resolved with weight loss.  Summary of Previous Visit: Patient with history of gastric bypass, went from 418 pounds to 264 pounds.  She has had skin breakdown and rashes of her pannus fold.  She had gastric bypass in 2021.  She is not a smoker, not a diabetic.  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  PMH Significant for: DVT of left lower extremity (left soleal vein and left gastrocnemius vein), gastric bypass surgery with significant weight loss, sleep apnea-resolved due to weight loss.  Hypertension-resolved due to weight loss.  She reports she has been feeling well lately, no recent changes in her health.  She denies any recent cardiac or pulmonary issues.  She reports that she is ready for surgery and feels well-informed.  She is on chronic Percocet 10 3 times per day which is prescribed by [facility] at [facility].   Past Medical History: Allergies: No Known Allergies  Current Medications:  Current Outpatient Medications:    acetaminophen (TYLENOL) 500 MG tablet, Take 1 tablet (500 mg total) by mouth every 6 (six) hours as needed., Disp:  30 tablet, Rfl: 0   azelastine (OPTIVAR) 0.05 % ophthalmic solution, Place 1 drop into both eyes 2 (two) times daily., Disp: , Rfl:    bimatoprost (LUMIGAN) 0.03 % ophthalmic solution, Place 1 drop into both eyes at bedtime., Disp: , Rfl:    brimonidine-timolol (COMBIGAN) 0.2-0.5 % ophthalmic solution, Place 1 drop into both eyes every 12 (twelve) hours., Disp: , Rfl:    calcium citrate-vitamin D (CELEBRATE CALCIUM CITRATE) 500-500 MG-UNIT chewable tablet, Chew 1 tablet by mouth every evening., Disp: , Rfl:    CEQUA 0.09 % SOLN, Place 1 drop into both eyes 2 (two) times daily., Disp: , Rfl:    citalopram (CELEXA) 40 MG tablet, Take 40 mg by mouth daily., Disp: , Rfl:    cyclobenzaprine (FLEXERIL) 10 MG tablet, Take 10 mg by mouth 3 (three) times daily., Disp: , Rfl:    diclofenac Sodium (VOLTAREN) 1 % GEL, Apply 4 g topically 4 (four) times daily., Disp: 100 g, Rfl: 1   Elastic Bandages & Supports (WRIST SPLINT/COCK-UP/LEFT L) MISC, Wear nightly and with exacerbating activities., Disp: 1 each, Rfl: 0   Elastic Bandages & Supports (WRIST SPLINT/COCK-UP/RIGHT L) MISC, Wear nightly and with exacerbating activities., Disp: 1 each, Rfl: 0   ergocalciferol (VITAMIN D2) 1.25 MG (50000 UT) capsule, Take 50,000 Units by mouth once a week., Disp: , Rfl:    ferrous sulfate 324 MG TBEC, Take 324 mg by mouth daily with breakfast., Disp: , Rfl:    hydrOXYzine (ATARAX/VISTARIL) 25  MG tablet, Take 1-2 tablets (25-50 mg total) by mouth at bedtime as needed. Take for insomnia. (Patient taking differently: Take 100 mg by mouth at bedtime. Take for insomnia.), Disp: 60 tablet, Rfl: 0   lidocaine (LIDODERM) 5 %, Place 1 patch onto the skin daily. Remove & Discard patch within 12 hours or as directed by MD, Disp: 30 patch, Rfl: 0   Lurasidone HCl (LATUDA) 60 MG TABS, Take 60 mg by mouth at bedtime., Disp: , Rfl:    Multiple Vitamins-Minerals (CELEBRATE MULTI-COMPLETE 45) CHEW, Chew 1 each by mouth daily., Disp: , Rfl:     orphenadrine (NORFLEX) 100 MG tablet, Take 1 tablet (100 mg total) by mouth 2 (two) times daily., Disp: 30 tablet, Rfl: 0   oxyCODONE-acetaminophen (PERCOCET) 10-325 MG tablet, Take 1 tablet by mouth twice a day as needed., Disp: 60 tablet, Rfl: 0   pravastatin (PRAVACHOL) 20 MG tablet, Take 1 tablet (20 mg total) by mouth daily., Disp: 90 tablet, Rfl: 1  Past Medical Problems: Past Medical History:  Diagnosis Date   Arthritis    Depression    Hypertension    Muscle spasms of lower extremity    and back   Sleep apnea     Past Surgical History: Past Surgical History:  Procedure Laterality Date   COLONOSCOPY WITH PROPOFOL N/A 09/24/2012   Procedure: COLONOSCOPY WITH PROPOFOL;  Surgeon: Jeryl Columbia, MD;  Location: [facility] ENDOSCOPY;  Service: Endoscopy;  Laterality: N/A;   COLONOSCOPY WITH PROPOFOL N/A 05/19/2016   Procedure: COLONOSCOPY WITH PROPOFOL;  Surgeon: Clarene Essex, MD;  [facility] ENDOSCOPY;  Service: Endoscopy;  Laterality: N/A;   LAPAROSCOPIC GASTRIC SLEEVE RESECTION N/A 04/24/2020   Procedure: LAPAROSCOPIC GASTRIC SLEEVE RESECTION;  Surgeon: Clovis Riley, MD;  Location: [facility] ORS;  Service: General;  Laterality: N/A;   NO PAST SURGERIES     TUBAL LIGATION     UPPER GI ENDOSCOPY N/A 04/24/2020   Procedure: UPPER GI ENDOSCOPY;  Surgeon: Clovis Riley, MD;  Location: [facility] ORS;  Service: General;  Laterality: N/A;    Social History: Social History   Socioeconomic History   Marital status: Single    Spouse name: Not on file   Number of children: Not on file   Years of education: Not on file   Highest education level: Not on file  Occupational History   Not on file  Tobacco Use   Smoking status: Never   Smokeless tobacco: Never  Vaping Use   Vaping Use: Never used  Substance and Sexual Activity   Alcohol use: No   Drug use: No   Sexual activity: Yes    Partners: Male    Birth control/protection: None  Other Topics Concern   Not on file  Social History Narrative    Not on file   Social Determinants of Health   Financial Resource Strain: Not on file  Food Insecurity: Not on file  Transportation Needs: Not on file  Physical Activity: Not on file  Stress: Not on file  Social Connections: Not on file  Intimate Partner Violence: Not on file    Family History: Family History  Problem Relation Age of Onset   Hypertension Mother    Diabetes Mother    Breast cancer Mother    Diabetes Father    Hypertension Father     Review of Systems: Review of Systems  Constitutional: Negative.   Respiratory: Negative.    Cardiovascular: Negative.   Gastrointestinal: Negative.   Musculoskeletal:  Positive for  back pain and joint pain.  Neurological: Negative.     Physical Exam: Vital Signs BP 132/85 (BP Location: Right Arm, Patient Position: Sitting, Cuff Size: Large)   Pulse (!) 104   Ht '5\' 5"'$  (1.651 m)   Wt 266 lb 9.6 oz (120.9 kg)   LMP 09/25/2015 (Approximate)   SpO2 97%   BMI 44.36 kg/m   Physical Exam Constitutional:      General: Not in acute distress.    Appearance: Normal appearance. Not ill-appearing.  HENT:     Head: Normocephalic and atraumatic.  Eyes:     Pupils: Pupils are equal, round Neck:     Musculoskeletal: Normal range of motion.  Cardiovascular:     Rate and Rhythm: Normal rate    Pulses: Normal pulses.  Pulmonary:     Effort: Pulmonary effort is normal. No respiratory distress.  Abdominal:     General: Abdomen is flat. There is no distension.  Musculoskeletal: Normal range of motion.  Skin:    General: Skin is warm and dry.     Findings: No erythema or rash.  Neurological:     General: No focal deficit present.     Mental Status: Alert and oriented to person, place, and time. Mental status is at baseline.     Motor: No weakness.  Psychiatric:        Mood and Affect: Mood normal.        Behavior: Behavior normal.    Assessment/Plan: The patient is scheduled for panniculectomy with liposuction with Dr.  Marla Roe.  Risks, benefits, and alternatives of procedure discussed, questions answered and consent obtained.    Smoking Status: Non-smoker; Counseling Given? N/A  Caprini Score: 12, high; Risk Factors include: History of DVT in left lower extremity February 2023, sister with DVT, BMI > 40, and length of planned surgery. Recommendation for mechanical and pharmacological prophylaxis. Encourage early ambulation.  Will discuss need for postop Lovenox for 1 to 2 weeks with Dr. Marla Roe.  Pictures obtained: '@consult'$   Post-op Rx sent to pharmacy:  Zofran, Keflex. Patient is on chronic Percocet 10 prescribed by Suella Broad, MD and Jabier Mutton, NP at [facility].  I discussed with the patient that no medications will be prescribed postoperatively for patient for pain mostly received clearance from [facility].  I instructed the patient to please reach out to her pain management provider for guidance and that we would also send a clearance to them.  Patient was provided with the General Surgical Risk consent document and Pain Medication Agreement prior to their appointment.  They had adequate time to read through the risk consent documents and Pain Medication Agreement. We also discussed them in person together during this preop appointment. All of their questions were answered to their satisfaction.  Recommended calling if they have any further questions.  Risk consent form and Pain Medication Agreement to be scanned into patient's chart.  The risk that can be encountered for this procedure were discussed and include the following but not limited to these: asymmetry, fluid accumulation, firmness of the tissue, skin loss, decrease or no sensation, fat necrosis, bleeding, infection, healing delay.  Deep vein thrombosis, cardiac and pulmonary complications are risks to any procedure.  There are risks of anesthesia, changes to skin sensation and injury to nerves or blood vessels.  The muscle can be  temporarily or permanently injured.  You may have an allergic reaction to tape, suture, glue, blood products which can result in skin discoloration, swelling, pain, skin lesions, poor  healing.  Any of these can lead to the need for revisonal surgery or stage procedures.  Weight gain and weigh loss can also effect the long term appearance. The results are not guaranteed to last a lifetime.  Future surgery may be required.    The risks that can be encountered with and after liposuction were discussed and include the following but no limited to these:  Asymmetry, fluid accumulation, firmness of the area, fat necrosis with death of fat tissue, bleeding, infection, delayed healing, anesthesia risks, skin sensation changes, injury to structures including nerves, blood vessels, and muscles which may be temporary or permanent, allergies to tape, suture materials and glues, blood products, topical preparations or injected agents, skin and contour irregularities, skin discoloration and swelling, deep vein thrombosis, cardiac and pulmonary complications, pain, which may persist, persistent pain, recurrence of the lesion, poor healing of the incision, possible need for revisional surgery or staged procedures. Thiere can also be persistent swelling, poor wound healing, rippling or loose skin, worsening of cellulite, swelling, and thermal burn or heat injury from ultrasound with the ultrasound-assisted lipoplasty technique. Any change in weight fluctuations can alter the outcome.    Electronically signed by: Carola Rhine Honor Frison, PA-C 03/07/2022 11:03 AM

## 2022-03-12 ENCOUNTER — Telehealth: Payer: Self-pay | Admitting: Surgical

## 2022-03-12 ENCOUNTER — Encounter (HOSPITAL_BASED_OUTPATIENT_CLINIC_OR_DEPARTMENT_OTHER): Payer: Self-pay | Admitting: Plastic Surgery

## 2022-03-12 ENCOUNTER — Encounter: Payer: Self-pay | Admitting: Surgical

## 2022-03-12 MED ORDER — ENOXAPARIN SODIUM 40 MG/0.4ML IJ SOSY: 40.0000 mg | PREFILLED_SYRINGE | INTRAMUSCULAR | 0 refills | Status: DC

## 2022-03-12 NOTE — Progress Notes (Signed)
Surgical Clearance has been received from Dr. Suella Broad for patient's upcoming surgery with Dr. Marla Roe on 03/19/22 .  Patient is currently on chronic narcotics (Percocet 10, TID).  Form states, "Post op pain management per Dr. Marla Roe, then can resume my medications"

## 2022-03-12 NOTE — H&P (View-Only) (Signed)
Surgical Clearance has been received from Dr. Suella Broad for patient's upcoming surgery with Dr. Marla Roe on 03/19/22 .  Patient is currently on chronic narcotics (Percocet 10, TID).  Form states, "Post op pain management per Dr. Marla Roe, then can resume my medications"

## 2022-03-12 NOTE — Telephone Encounter (Signed)
Spoke with patient, discussed need for post op lovenox due to her VTE hx and caprini score of 12. She was understanding of this and willing to do injections of lovenox post operatively for 1 week. I discussed her case with Dr. Marla Roe, she is in agreement with this plan.  We also discussed her pain medication. She will continue with her chronic percocet 10 and use addtl pain meds for break through pain, will rx day of surgery.

## 2022-03-18 ENCOUNTER — Telehealth: Payer: Self-pay | Admitting: Plastic Surgery

## 2022-03-18 NOTE — Telephone Encounter (Signed)
Please call pt regarding pain med to be called in before surgery.

## 2022-03-18 NOTE — Telephone Encounter (Signed)
Spoke with patient, discussed recommendations for postoperative pain medicine.  Patient reports she is taking Percocet 10 3 times daily, reports she has been taking this for quite some time.  I am worried that if she stops this throughout the perioperative period she may have some withdrawals.  She is going to continue this through the perioperative period, she will supplement with additional Tylenol 500 mg every 6-8 hours.  If she does not have adequate postoperative pain control, we will rediscuss and may need to add oxycodone 5 mg every 12 hours for breakthrough pain.  Patient was agreeable to this and we will plan to discuss further if pain is uncontrolled.

## 2022-03-19 ENCOUNTER — Other Ambulatory Visit: Payer: Self-pay

## 2022-03-19 ENCOUNTER — Ambulatory Visit (HOSPITAL_BASED_OUTPATIENT_CLINIC_OR_DEPARTMENT_OTHER): Payer: Medicare HMO | Admitting: Certified Registered"

## 2022-03-19 ENCOUNTER — Telehealth: Payer: Self-pay | Admitting: Plastic Surgery

## 2022-03-19 ENCOUNTER — Encounter (HOSPITAL_BASED_OUTPATIENT_CLINIC_OR_DEPARTMENT_OTHER)
Admission: RE | Disposition: A | Discharge status: Home / Self Care | Payer: Self-pay | Source: Home / Self Care | Attending: Plastic Surgery

## 2022-03-19 ENCOUNTER — Ambulatory Visit (HOSPITAL_BASED_OUTPATIENT_CLINIC_OR_DEPARTMENT_OTHER)
Admission: RE | Admit: 2022-03-19 | Discharge: 2022-03-19 | Disposition: A | Discharge status: Home / Self Care | Payer: Medicare HMO | Attending: Plastic Surgery | Admitting: Plastic Surgery

## 2022-03-19 ENCOUNTER — Encounter (HOSPITAL_BASED_OUTPATIENT_CLINIC_OR_DEPARTMENT_OTHER): Payer: Self-pay | Admitting: Plastic Surgery

## 2022-03-19 DIAGNOSIS — Z6841 Body Mass Index (BMI) 40.0 and over, adult: Secondary | ICD-10-CM | POA: Diagnosis not present

## 2022-03-19 DIAGNOSIS — M793 Panniculitis, unspecified: Secondary | ICD-10-CM | POA: Diagnosis not present

## 2022-03-19 DIAGNOSIS — Z79891 Long term (current) use of opiate analgesic: Secondary | ICD-10-CM | POA: Diagnosis not present

## 2022-03-19 DIAGNOSIS — I10 Essential (primary) hypertension: Secondary | ICD-10-CM | POA: Diagnosis not present

## 2022-03-19 DIAGNOSIS — Z9884 Bariatric surgery status: Secondary | ICD-10-CM | POA: Diagnosis not present

## 2022-03-19 DIAGNOSIS — Z86718 Personal history of other venous thrombosis and embolism: Secondary | ICD-10-CM | POA: Insufficient documentation

## 2022-03-19 HISTORY — PX: ABDOMINOPLASTY/PANNICULECTOMY WITH LIPOSUCTION: SHX5577

## 2022-03-19 SURGERY — PANNICULECTOMY, ABDOMINAL, WITH LIPOSUCTION
Anesthesia: General | Site: Abdomen

## 2022-03-19 MED ORDER — CEFAZOLIN SODIUM-DEXTROSE 2-4 GM/100ML-% IV SOLN
2.0000 g | INTRAVENOUS | Status: AC
  Administered 2022-03-19: 3 g via INTRAVENOUS

## 2022-03-19 MED ORDER — PROPOFOL 10 MG/ML IV BOLUS
INTRAVENOUS | Status: AC
  Filled 2022-03-19: qty 20

## 2022-03-19 MED ORDER — KETAMINE HCL-SODIUM CHLORIDE 100-0.9 MG/10ML-% IV SOSY
PREFILLED_SYRINGE | INTRAVENOUS | Status: DC | PRN
  Administered 2022-03-19: 30 mg via INTRAVENOUS
  Administered 2022-03-19: 20 mg via INTRAVENOUS

## 2022-03-19 MED ORDER — ACETAMINOPHEN 325 MG RE SUPP: 650.0000 mg | RECTAL | Status: DC | PRN

## 2022-03-19 MED ORDER — SODIUM CHLORIDE 0.9 % IV SOLN
INTRAVENOUS | Status: DC | PRN
  Administered 2022-03-19: 40 mL

## 2022-03-19 MED ORDER — MIDAZOLAM HCL 5 MG/5ML IJ SOLN
INTRAMUSCULAR | Status: DC | PRN
  Administered 2022-03-19: 2 mg via INTRAVENOUS

## 2022-03-19 MED ORDER — HYDROMORPHONE HCL 1 MG/ML IJ SOLN: 0.2500 mg | INTRAMUSCULAR | Status: DC | PRN

## 2022-03-19 MED ORDER — DEXAMETHASONE SODIUM PHOSPHATE 10 MG/ML IJ SOLN
INTRAMUSCULAR | Status: AC
  Filled 2022-03-19: qty 1

## 2022-03-19 MED ORDER — SCOPOLAMINE 1 MG/3DAYS TD PT72
1.0000 | MEDICATED_PATCH | Freq: Once | TRANSDERMAL | Status: DC
  Administered 2022-03-19: 1.5 mg via TRANSDERMAL

## 2022-03-19 MED ORDER — PROPOFOL 10 MG/ML IV BOLUS
INTRAVENOUS | Status: DC | PRN
  Administered 2022-03-19: 120 mg via INTRAVENOUS

## 2022-03-19 MED ORDER — FENTANYL CITRATE (PF) 100 MCG/2ML IJ SOLN
INTRAMUSCULAR | Status: AC
  Filled 2022-03-19: qty 2

## 2022-03-19 MED ORDER — KETAMINE HCL 50 MG/5ML IJ SOSY
PREFILLED_SYRINGE | INTRAMUSCULAR | Status: AC
  Filled 2022-03-19: qty 5

## 2022-03-19 MED ORDER — SUCCINYLCHOLINE CHLORIDE 200 MG/10ML IV SOSY
PREFILLED_SYRINGE | INTRAVENOUS | Status: AC
  Filled 2022-03-19: qty 10

## 2022-03-19 MED ORDER — FENTANYL CITRATE (PF) 100 MCG/2ML IJ SOLN: 25.0000 ug | INTRAMUSCULAR | Status: DC | PRN

## 2022-03-19 MED ORDER — CEFAZOLIN SODIUM-DEXTROSE 1-4 GM/50ML-% IV SOLN
INTRAVENOUS | Status: AC
  Filled 2022-03-19: qty 50

## 2022-03-19 MED ORDER — AMISULPRIDE (ANTIEMETIC) 5 MG/2ML IV SOLN: 10.0000 mg | Freq: Once | INTRAVENOUS | Status: DC | PRN

## 2022-03-19 MED ORDER — SCOPOLAMINE 1 MG/3DAYS TD PT72
MEDICATED_PATCH | TRANSDERMAL | Status: AC
  Filled 2022-03-19: qty 1

## 2022-03-19 MED ORDER — MIDAZOLAM HCL 2 MG/2ML IJ SOLN
INTRAMUSCULAR | Status: AC
  Filled 2022-03-19: qty 2

## 2022-03-19 MED ORDER — LIDOCAINE 2% (20 MG/ML) 5 ML SYRINGE
INTRAMUSCULAR | Status: AC
  Filled 2022-03-19: qty 5

## 2022-03-19 MED ORDER — ACETAMINOPHEN 500 MG PO TABS
1000.0000 mg | ORAL_TABLET | Freq: Once | ORAL | Status: AC
  Administered 2022-03-19: 1000 mg via ORAL

## 2022-03-19 MED ORDER — ROCURONIUM BROMIDE 10 MG/ML (PF) SYRINGE
PREFILLED_SYRINGE | INTRAVENOUS | Status: AC
  Filled 2022-03-19: qty 10

## 2022-03-19 MED ORDER — ACETAMINOPHEN 325 MG PO TABS: 650.0000 mg | ORAL_TABLET | ORAL | Status: DC | PRN

## 2022-03-19 MED ORDER — BUPIVACAINE LIPOSOME 1.3 % IJ SUSP
INTRAMUSCULAR | Status: AC
  Filled 2022-03-19: qty 20

## 2022-03-19 MED ORDER — ROCURONIUM BROMIDE 10 MG/ML (PF) SYRINGE
PREFILLED_SYRINGE | INTRAVENOUS | Status: DC | PRN
  Administered 2022-03-19: 80 mg via INTRAVENOUS

## 2022-03-19 MED ORDER — CHLORHEXIDINE GLUCONATE CLOTH 2 % EX PADS: 6.0000 | MEDICATED_PAD | Freq: Once | CUTANEOUS | Status: DC

## 2022-03-19 MED ORDER — ONDANSETRON HCL 4 MG/2ML IJ SOLN
INTRAMUSCULAR | Status: AC
  Filled 2022-03-19: qty 2

## 2022-03-19 MED ORDER — ACETAMINOPHEN 500 MG PO TABS
ORAL_TABLET | ORAL | Status: AC
  Filled 2022-03-19: qty 2

## 2022-03-19 MED ORDER — SODIUM CHLORIDE 0.9% FLUSH: 3.0000 mL | Freq: Two times a day (BID) | INTRAVENOUS | Status: DC

## 2022-03-19 MED ORDER — SODIUM CHLORIDE 0.9 % IV SOLN: 250.0000 mL | INTRAVENOUS | Status: DC | PRN

## 2022-03-19 MED ORDER — ONDANSETRON HCL 4 MG/2ML IJ SOLN
INTRAMUSCULAR | Status: DC | PRN
  Administered 2022-03-19: 4 mg via INTRAVENOUS

## 2022-03-19 MED ORDER — LIDOCAINE HCL 1 % IJ SOLN
INTRAVENOUS | Status: DC | PRN
  Administered 2022-03-19: 400 mL

## 2022-03-19 MED ORDER — PHENYLEPHRINE 80 MCG/ML (10ML) SYRINGE FOR IV PUSH (FOR BLOOD PRESSURE SUPPORT)
PREFILLED_SYRINGE | INTRAVENOUS | Status: DC | PRN
  Administered 2022-03-19 (×2): 80 ug via INTRAVENOUS

## 2022-03-19 MED ORDER — LIDOCAINE 2% (20 MG/ML) 5 ML SYRINGE
INTRAMUSCULAR | Status: DC | PRN
  Administered 2022-03-19: 100 mg via INTRAVENOUS

## 2022-03-19 MED ORDER — CEFAZOLIN SODIUM-DEXTROSE 2-4 GM/100ML-% IV SOLN
INTRAVENOUS | Status: AC
  Filled 2022-03-19: qty 100

## 2022-03-19 MED ORDER — SODIUM CHLORIDE (PF) 0.9 % IJ SOLN
INTRAMUSCULAR | Status: AC
  Filled 2022-03-19: qty 20

## 2022-03-19 MED ORDER — LACTATED RINGERS IV SOLN: INTRAVENOUS | Status: DC

## 2022-03-19 MED ORDER — FENTANYL CITRATE (PF) 250 MCG/5ML IJ SOLN
INTRAMUSCULAR | Status: DC | PRN
  Administered 2022-03-19 (×3): 50 ug via INTRAVENOUS

## 2022-03-19 MED ORDER — OXYCODONE HCL 5 MG PO TABS: 5.0000 mg | ORAL_TABLET | ORAL | Status: DC | PRN

## 2022-03-19 MED ORDER — SUGAMMADEX SODIUM 200 MG/2ML IV SOLN
INTRAVENOUS | Status: DC | PRN
  Administered 2022-03-19: 200 mg via INTRAVENOUS

## 2022-03-19 MED ORDER — DEXAMETHASONE SODIUM PHOSPHATE 10 MG/ML IJ SOLN
INTRAMUSCULAR | Status: DC | PRN
  Administered 2022-03-19: 4 mg via INTRAVENOUS

## 2022-03-19 MED ORDER — LIDOCAINE-EPINEPHRINE 1 %-1:100000 IJ SOLN
INTRAMUSCULAR | Status: DC | PRN
  Administered 2022-03-19: 50 mL via INTRAMUSCULAR

## 2022-03-19 MED ORDER — SODIUM CHLORIDE 0.9% FLUSH: 3.0000 mL | INTRAVENOUS | Status: DC | PRN

## 2022-03-19 SURGICAL SUPPLY — 64 items
ADH SKN CLS APL DERMABOND .7 (GAUZE/BANDAGES/DRESSINGS) ×2
AGENT HMST PWDR BTL CLGN 5GM (Miscellaneous) ×1 IMPLANT
APPLIER CLIP 9.375 MED OPEN (MISCELLANEOUS) ×1
APR CLP MED 9.3 20 MLT OPN (MISCELLANEOUS) ×1
BAG DECANTER FOR FLEXI CONT (MISCELLANEOUS) IMPLANT
BINDER ABDOMINAL 10 UNV 27-48 (MISCELLANEOUS) IMPLANT
BINDER ABDOMINAL 12 ML 46-62 (SOFTGOODS) IMPLANT
BINDER ABDOMINAL 12 SM 30-45 (SOFTGOODS) IMPLANT
BIOPATCH RED 1 DISK 7.0 (GAUZE/BANDAGES/DRESSINGS) IMPLANT
BLADE CLIPPER SURG (BLADE) ×1 IMPLANT
BLADE HEX COATED 2.75 (ELECTRODE) ×1 IMPLANT
BLADE SURG 10 STRL SS (BLADE) ×2 IMPLANT
BLADE SURG 15 STRL LF DISP TIS (BLADE) ×1 IMPLANT
BLADE SURG 15 STRL SS (BLADE) ×1
CANISTER SUCT 1200ML W/VALVE (MISCELLANEOUS) ×1 IMPLANT
CLIP APPLIE 9.375 MED OPEN (MISCELLANEOUS) IMPLANT
COLLAGEN CELLERATERX 5 GRAM (Miscellaneous) IMPLANT
COVER BACK TABLE 60X90IN (DRAPES) ×1 IMPLANT
COVER MAYO STAND STRL (DRAPES) ×1 IMPLANT
DERMABOND ADVANCED .7 DNX12 (GAUZE/BANDAGES/DRESSINGS) ×2 IMPLANT
DRAIN CHANNEL 19F RND (DRAIN) IMPLANT
DRAPE LAPAROSCOPIC ABDOMINAL (DRAPES) ×1 IMPLANT
DRSG OPSITE POSTOP 4X12 (GAUZE/BANDAGES/DRESSINGS) ×1 IMPLANT
ELECT BLADE 4.0 EZ CLEAN MEGAD (MISCELLANEOUS) ×1
ELECT REM PT RETURN 9FT ADLT (ELECTROSURGICAL) ×1
ELECTRODE BLDE 4.0 EZ CLN MEGD (MISCELLANEOUS) ×1 IMPLANT
ELECTRODE REM PT RTRN 9FT ADLT (ELECTROSURGICAL) ×1 IMPLANT
EVACUATOR SILICONE 100CC (DRAIN) IMPLANT
GAUZE PAD ABD 8X10 STRL (GAUZE/BANDAGES/DRESSINGS) ×2 IMPLANT
GAUZE SPONGE 4X4 12PLY STRL (GAUZE/BANDAGES/DRESSINGS) IMPLANT
GLOVE BIO SURGEON STRL SZ 6.5 (GLOVE) ×2 IMPLANT
GLOVE BIOGEL PI IND STRL 7.0 (GLOVE) ×1 IMPLANT
GOWN STRL REUS W/ TWL LRG LVL3 (GOWN DISPOSABLE) ×2 IMPLANT
GOWN STRL REUS W/TWL LRG LVL3 (GOWN DISPOSABLE) ×4
LINER CANISTER 1000CC FLEX (MISCELLANEOUS) IMPLANT
NDL HYPO 25X1 1.5 SAFETY (NEEDLE) ×1 IMPLANT
NDL SAFETY ECLIP 18X1.5 (MISCELLANEOUS) IMPLANT
NEEDLE HYPO 25X1 1.5 SAFETY (NEEDLE) ×2 IMPLANT
NS IRRIG 1000ML POUR BTL (IV SOLUTION) ×1 IMPLANT
PACK BASIN DAY SURGERY FS (CUSTOM PROCEDURE TRAY) ×1 IMPLANT
PENCIL SMOKE EVACUATOR (MISCELLANEOUS) ×1 IMPLANT
PIN SAFETY STERILE (MISCELLANEOUS) IMPLANT
SLEEVE SCD COMPRESS KNEE MED (STOCKING) ×1 IMPLANT
SPONGE T-LAP 18X18 ~~LOC~~+RFID (SPONGE) ×2 IMPLANT
STRIP SUTURE WOUND CLOSURE 1/2 (MISCELLANEOUS) ×2 IMPLANT
SUT MNCRL AB 4-0 PS2 18 (SUTURE) ×2 IMPLANT
SUT MON AB 3-0 SH 27 (SUTURE) ×6
SUT MON AB 3-0 SH27 (SUTURE) ×2 IMPLANT
SUT MON AB 5-0 PS2 18 (SUTURE) IMPLANT
SUT PDS 3-0 CT2 (SUTURE) ×5
SUT PDS II 3-0 CT2 27 ABS (SUTURE) ×5 IMPLANT
SUT SILK 2 0 SH (SUTURE) IMPLANT
SUT VICRYL 4-0 PS2 18IN ABS (SUTURE) ×2 IMPLANT
SYR 50ML LL SCALE MARK (SYRINGE) IMPLANT
SYR BULB IRRIG 60ML STRL (SYRINGE) ×1 IMPLANT
SYR CONTROL 10ML LL (SYRINGE) ×1 IMPLANT
SYR TB 1ML LL NO SAFETY (SYRINGE) IMPLANT
TOWEL GREEN STERILE FF (TOWEL DISPOSABLE) ×2 IMPLANT
TRAY DSU PREP LF (CUSTOM PROCEDURE TRAY) ×1 IMPLANT
TUBE CONNECTING 20X1/4 (TUBING) ×1 IMPLANT
TUBING INFILTRATION IT-10001 (TUBING) IMPLANT
TUBING SET GRADUATE ASPIR 12FT (MISCELLANEOUS) IMPLANT
UNDERPAD 30X36 HEAVY ABSORB (UNDERPADS AND DIAPERS) ×2 IMPLANT
YANKAUER SUCT BULB TIP NO VENT (SUCTIONS) ×1 IMPLANT

## 2022-03-19 NOTE — Telephone Encounter (Signed)
Called patient, recommend starting tomorrow AM.

## 2022-03-19 NOTE — Interval H&P Note (Signed)
History and Physical Interval Note:  03/19/2022 7:11 AM  Carla Sloan  has presented today for surgery, with the diagnosis of panniculitis.  The various methods of treatment have been discussed with the patient and family. After consideration of risks, benefits and other options for treatment, the patient has consented to  Procedure(s): PANNICULECTOMY WITH LIPOSUCTION (N/A) as a surgical intervention.  The patient's history has been reviewed, patient examined, no change in status, stable for surgery.  I have reviewed the patient's chart and labs.  Questions were answered to the patient's satisfaction.     Loel Lofty Kaysa Roulhac

## 2022-03-19 NOTE — Interval H&P Note (Signed)
History and Physical Interval Note:  03/19/2022 7:11 AM  Carla Sloan  has presented today for surgery, with the diagnosis of panniculitis.  The various methods of treatment have been discussed with the patient and family. After consideration of risks, benefits and other options for treatment, the patient has consented to  Procedure(s): PANNICULECTOMY WITH LIPOSUCTION (N/A) as a surgical intervention.  The patient's history has been reviewed, patient examined, no change in status, stable for surgery.  I have reviewed the patient's chart and labs.  Questions were answered to the patient's satisfaction.     Loel Lofty Anastasija Anfinson

## 2022-03-19 NOTE — Anesthesia Procedure Notes (Signed)
Procedure Name: Intubation Date/Time: 03/19/2022 7:56 AM  Performed by: Lowella Dell, CRNAPre-anesthesia Checklist: Patient identified, Emergency Drugs available, Suction available and Patient being monitored Patient Re-evaluated:Patient Re-evaluated prior to induction Oxygen Delivery Method: Circle System Utilized Preoxygenation: Pre-oxygenation with 100% oxygen Induction Type: IV induction Ventilation: Mask ventilation without difficulty Laryngoscope Size: Mac and 3 Grade View: Grade I Tube type: Oral Tube size: 7.0 mm Number of attempts: 1 Airway Equipment and Method: Stylet Placement Confirmation: ETT inserted through vocal cords under direct vision, positive ETCO2 and breath sounds checked- equal and bilateral Secured at: 21 cm Tube secured with: Tape Dental Injury: Teeth and Oropharynx as per pre-operative assessment

## 2022-03-19 NOTE — Transfer of Care (Signed)
Immediate Anesthesia Transfer of Care Note  Patient: Laelynn Blizzard  Procedure(s) Performed: PANNICULECTOMY WITH LIPOSUCTION (Abdomen)  Patient Location: PACU  Anesthesia Type:General  Level of Consciousness: awake, alert , and oriented  Airway & Oxygen Therapy: Patient Spontanous Breathing and Patient connected to face mask oxygen  Post-op Assessment: Report given to RN, Post -op Vital signs reviewed and stable, and Patient moving all extremities X 4  Post vital signs: Reviewed and stable  Last Vitals:  Vitals Value Taken Time  BP 133/83   Temp    Pulse 95   Resp 14   SpO2 98     Last Pain:  Vitals:   03/19/22 0625  TempSrc: Oral  PainSc: 0-No pain      Patients Stated Pain Goal: 3 (85/90/93 1121)  Complications: No notable events documented.

## 2022-03-19 NOTE — Telephone Encounter (Signed)
Patient had sx today 03-19-22, pt called asking when she had to start her injections after sx

## 2022-03-19 NOTE — Anesthesia Preprocedure Evaluation (Addendum)
Anesthesia Evaluation  Patient identified by MRN, date of birth, ID band Patient awake    Reviewed: Allergy & Precautions, NPO status , Patient's Chart, lab work & pertinent test results  History of Anesthesia Complications Negative for: history of anesthetic complications  Airway Mallampati: II  TM Distance: >3 FB Neck ROM: Full    Dental  (+) Poor Dentition, Missing,    Pulmonary neg pulmonary ROS   Pulmonary exam normal        Cardiovascular hypertension, Normal cardiovascular exam     Neuro/Psych    Depression    negative neurological ROS     GI/Hepatic negative GI ROS, Neg liver ROS,,,  Endo/Other    Morbid obesity  Renal/GU negative Renal ROS  negative genitourinary   Musculoskeletal  (+) Arthritis ,    Abdominal   Peds  Hematology negative hematology ROS (+)   Anesthesia Other Findings panniculitis  Reproductive/Obstetrics                              Anesthesia Physical Anesthesia Plan  ASA: 3  Anesthesia Plan: General   Post-op Pain Management: Tylenol PO (pre-op)*, Toradol IV (intra-op)* and Ketamine IV*   Induction: Intravenous  PONV Risk Score and Plan: 3 and Treatment may vary due to age or medical condition, Midazolam, Scopolamine patch - Pre-op, Dexamethasone, Ondansetron and Propofol infusion  Airway Management Planned: Oral ETT  Additional Equipment: None  Intra-op Plan:   Post-operative Plan: Extubation in OR  Informed Consent: I have reviewed the patients History and Physical, chart, labs and discussed the procedure including the risks, benefits and alternatives for the proposed anesthesia with the patient or authorized representative who has indicated his/her understanding and acceptance.     Dental advisory given  Plan Discussed with: CRNA  Anesthesia Plan Comments:         Anesthesia Quick Evaluation

## 2022-03-19 NOTE — Anesthesia Postprocedure Evaluation (Signed)
Anesthesia Post Note  Patient: Carla Sloan  Procedure(s) Performed: PANNICULECTOMY WITH LIPOSUCTION (Abdomen)     Patient location during evaluation: PACU Anesthesia Type: General Level of consciousness: awake and alert and oriented Pain management: pain level controlled Vital Signs Assessment: post-procedure vital signs reviewed and stable Respiratory status: spontaneous breathing, nonlabored ventilation and respiratory function stable Cardiovascular status: blood pressure returned to baseline and stable Postop Assessment: no apparent nausea or vomiting Anesthetic complications: no   No notable events documented.  Last Vitals:  Vitals:   03/19/22 1015 03/19/22 1030  BP: (!) 116/94 (!) 158/92  Pulse: 65 (!) 58  Resp: (!) 21 20  Temp:  36.5 C  SpO2: 99% 95%    Last Pain:  Vitals:   03/19/22 1030  TempSrc:   PainSc: 0-No pain                 Carla Sloan A.

## 2022-03-19 NOTE — Telephone Encounter (Signed)
Had sx today 03-19-22 and called asking when she was to start her injections after sx

## 2022-03-19 NOTE — Discharge Instructions (Addendum)
INSTRUCTIONS FOR AFTER ABDOMINAL SURGERY  You will likely have some questions about what to expect following your operation.  The following information will help you and your family understand what to expect when you get home.  Following these guidelines will help ensure a smooth recovery and reduce risks of complications.  Postoperative instructions include information on: diet, wound care, medications and physical activity.  AFTER SURGERY Expect to go home after the procedure.  In some cases, you may need to spend one night in the hospital for observation.  DIET This surgery does not require a specific diet.  However, the healthier you eat the better your body can heal. It is important to increasing your protein intake.  Limit foods with high sugar and  carbohydrate content.  Focus on vegetables, meat and other protein sources if you are vegan or vegetarian.  If you undergo liposuction during your procedure it is very important to drink 8 oz of water every hour while awake for 2 days.  If your urine is bright yellow, then it is concentrated, and you need to drink more water.  If you find you are persistently nauseated or unable to take in liquids let us know.  NO TOBACCO USE or EXPOSURE.  This will slow your healing process and increase the risk of a wound.  WOUND CARE Leave the abdominal binder in place for 3 days.  Then you can remove it and shower.  Replace the binder or spanx after your shower.   You may have Topifoam or Lipofoam on.  It is soft and spongy and helps keep you from getting creases if you have liposuction.  This can be removed before the shower and then replaced.  If you need more it is available on [place] as lipofoam.  If you have steri-strips / tape directly attached to your skin leave them in place. It is OK to get these wet.  No baths, pools or hot tubs for four weeks. We close your incision to leave the smallest and best-looking scar. No ointment or creams on your incisions  until cleared by your surgeon.  No Neosporin (Too many skin reactions with this one).  After the steri-strips are off can use Mederma or Skinuva and start massaging the scar. Continue to wear the binder/spanx or Ace wrap around the clock, including while sleeping, for 6 weeks. This provides added comfort and helps reduce the fluid accumulation at the surgery site.  ACTIVITY No heavy lifting until cleared by the doctor.  For example, no more than a half-gallon of milk.  It is OK to walk and you are encouraged to move your legs to help decrease your risk of getting a blood clot.  It will also help keep you from getting deconditioned.  Every 1 to 2 hours get up and walk for 5 minutes. This will help with a quicker recovery back to normal.  Let pain be your guide so you don't do too much.     SLEEPING / RESTING Sleeping and resting should be in the jack-knife or bent forward position with your head elevated.  This will help reduce pulling on your abdominal incision.  You can elevate your head and upper back with a few pillows and place a pillow under your knees.  Avoid stomach sleeping for 3 months.   WORK Everyone returns to work at different times. As a rough guide, most people take 1 - 2 weeks off prior to returning to work. If you need documentation for your  job give them to the front staff for processing.  DRIVING Arrange for someone to bring you home from the hospital.  You may be able to drive a few days after surgery but not while taking any narcotics or valium.  This is for your safety as well as others sharing the road with you.  BOWEL MOVEMENTS Constipation can occur after anesthesia and while taking pain medication.  It is important to stay ahead for your comfort.  We recommend taking Milk of Magnesia (2 tablespoons; twice a day) while taking the pain pills.  MEDICATIONS (you may receive and should be started after surgery) At your preoperative visit for you history and physical you were  given the following medications: Antibiotic: Start this medication when you get home and take according to the instructions on the bottle. Zofran 4 mg:  This is to treat nausea and vomiting.  You can take this every 6 hours as needed and only if needed. Norco (hydrocodone/acetaminophen) 5/325 mg:  This is only to be used after you have taken the Motrin or the Tylenol. Every 8 hours as needed.  Over the counter Medication to take: Ibuprofen (Motrin) 600 mg:  Take this every 6 hours.  If you have additional pain then take 500 mg of the Tylenol.  Only take the Norco after you have tried these two. MiraLAX or stool softener of choice: Take this according to the bottle if you take the [place] Call your surgeon's office if any of the following occur:  Fever 101 degrees F or greater  Excessive bleeding or fluid from the incision site.  Pain that increases over time without aid from the medications  Redness, warmth, or pus draining from incision sites  Persistent nausea or inability to take in liquids  Severe misshapen area that underwent the operation.  Information for Discharge Teaching: EXPAREL (bupivacaine liposome injectable suspension)   Your surgeon or anesthesiologist gave you EXPAREL(bupivacaine) to help control your pain after surgery.  EXPAREL is a local anesthetic that provides pain relief by numbing the tissue around the surgical site. EXPAREL is designed to release pain medication over time and can control pain for up to 72 hours. Depending on how you respond to EXPAREL, you may require less pain medication during your recovery.  Possible side effects: Temporary loss of sensation or ability to move in the area where bupivacaine was injected. Nausea, vomiting, constipation Rarely, numbness and tingling in your mouth or lips, lightheadedness, or anxiety may occur. Call your doctor right away if you think you may be experiencing any of these sensations, or if you have  other questions regarding possible side effects.  Follow all other discharge instructions given to you by your surgeon or nurse. Eat a healthy diet and drink plenty of water or other fluids.  If you return to the hospital for any reason within 96 hours following the administration of EXPAREL, it is important for health care providers to know that you have received this anesthetic. A teal colored band has been placed on your arm with the date, time and amount of EXPAREL you have received in order to alert and inform your health care providers. Please leave this armband in place for the full 96 hours following administration, and then you may remove the band.   Post Anesthesia Home Care Instructions  Activity: Get plenty of rest for the remainder of the day. A responsible individual must stay with you for 24 hours following the procedure.  For  the next 24 hours, DO NOT: -Drive a car -Paediatric nurse -Drink alcoholic beverages -Take any medication unless instructed by your physician -Make any legal decisions or sign important papers.  Meals: Start with liquid foods such as gelatin or soup. Progress to regular foods as tolerated. Avoid greasy, spicy, heavy foods. If nausea and/or vomiting occur, drink only clear liquids until the nausea and/or vomiting subsides. Call your physician if vomiting continues.  Special Instructions/Symptoms: Your throat may feel dry or sore from the anesthesia or the breathing tube placed in your throat during surgery. If this causes discomfort, gargle with warm salt water. The discomfort should disappear within 24 hours.  If you had a scopolamine patch placed behind your ear for the management of post- operative nausea and/or vomiting:  1. The medication in the patch is effective for 72 hours, after which it should be removed.  Wrap patch in a tissue and discard in the trash. Wash hands thoroughly with soap and water. 2. You may remove the patch earlier than 72  hours if you experience unpleasant side effects which may include dry mouth, dizziness or visual disturbances. 3. Avoid touching the patch. Wash your hands with soap and water after contact with the patch.  Tylenol can be taken after 1 pm

## 2022-03-19 NOTE — Op Note (Signed)
Operative Report  Date of operation: 03/19/2022  Patient: Carla Sloan, MRN: [id number], 62 y.o. female.   Date of birth: 1960/12/29  Location: [place] [facility]  Preoperative Diagnosis: Panniculitis  Postoperative Diagnosis:  Same  Procedure: Panniculectomy  Surgeon:  Theodoro Kos Jayd Forrey  Assistant:  Roetta Sessions, PA  Anesthesia:  General  EBL:  100 cc  Drains:  # 51 blake round drain  Condition:  Stable  Complications: None  Disposition: Recovery Room  Procedure in Detail: Patient was seen the morning of her surgery and marked out for the procedure. She was then given an IV and IV antibiotics. The patient was taken to the operating room and underwent general anesthesia.  A time out was called and all information was confirmed to be correct. SCD's and a pillow under the knees was in place. The patient was then prepped and draped in the standard sterile fashion. Local was placed into the incision and 2 stab incisions made through which 200 cc of tumescent was placed in each flank area. The flanks were liposuctioned and 300 cc removed from the sides. The planned lower incision was then incised and the incision taken down through the Scarpa's fascia to the rectus abdominus fascia. The skin and subcutaneous tissue was then lifted off the fascia up to the level of the umbilicus.   The amount that could be excised was confirmed. The pannus was excised and it weighed 3123 grams. The mons was also suspended with 3-0 PDS.  The wound was irrigated with normal saline solution. Cellerate was placed and the Experel. A #19 blake round drain was placed and secured with 3-0 Silk. The abdominal wall was closed with buried 3-0 PDS and 3-0 Monocryl.  The skin was closed with the 4-0 Monocryl.  The wound was then dressed with dermabond. ABD's and an abdominal binder were placed. Patient was allowed to wake up, extubated and taken on a stretcher in the flexed position to  the recovery room. Family was notified at the end of the case.   The advanced practice practitioner (APP) assisted throughout the case.  The APP was essential in retraction and counter traction when needed to make the case progress smoothly.  This retraction and assistance made it possible to see the tissue plans for the procedure.  The assistance was needed for blood control, tissue re-approximation and assisted with closure of the incision site.

## 2022-03-20 ENCOUNTER — Telehealth: Payer: Self-pay | Admitting: Plastic Surgery

## 2022-03-20 ENCOUNTER — Encounter (HOSPITAL_BASED_OUTPATIENT_CLINIC_OR_DEPARTMENT_OTHER): Payer: Self-pay | Admitting: Plastic Surgery

## 2022-03-20 ENCOUNTER — Ambulatory Visit (INDEPENDENT_AMBULATORY_CARE_PROVIDER_SITE_OTHER): Payer: Medicaid Other | Admitting: Physician Assistant

## 2022-03-20 DIAGNOSIS — M793 Panniculitis, unspecified: Secondary | ICD-10-CM

## 2022-03-20 NOTE — Telephone Encounter (Signed)
Pt called wants to know if she needs to put bandage back on after showering or leave it off sx was 1.24 Panniculectomy

## 2022-03-20 NOTE — Progress Notes (Signed)
This is a 62 year old female who is status post panniculectomy yesterday 03/19/2022 by Dr. Marla Roe.  She was seen via telehealth visit for postop follow-up.  She is located in [place], I am at my office in [place].  Overall she notes she is doing well, she notes that the pain is well-controlled and is only taking her daily pain medication.  She notes she has had a bowel movement, she denies any fever.  She notes minimal drain output, she does note some minimal leaking around the drain site but notes that the bulb is still working properly.  She notes she is taking Lovenox and getting up and ambulating frequently.  Physical exam.  Telehealth visit.  Patient speaking in full sentences in no acute distress  Overall the patient is doing very well postoperatively no issues or concerns.  I instructed her to reach out to [place] immediately if she develops any new or worsening signs or symptoms or has any questions or concerns.  Otherwise we will see her in our office for routine postop follow-up.  The patient verbalized understanding and agreement to today's plan.  I spent approximately 10 minutes on today's visit.

## 2022-03-20 NOTE — Telephone Encounter (Signed)
Patient was aware to shower 3 days after SX but wanted to know if she should reapply a bandage on the incisions afterwards. Adv her that she should cover so that the binder does not produce friction and cause irritation which could risk the incision to open. Pt conveyed understanding.

## 2022-03-21 ENCOUNTER — Ambulatory Visit (INDEPENDENT_AMBULATORY_CARE_PROVIDER_SITE_OTHER): Payer: Medicare HMO | Admitting: Physician Assistant

## 2022-03-21 DIAGNOSIS — M793 Panniculitis, unspecified: Secondary | ICD-10-CM

## 2022-03-21 NOTE — Progress Notes (Signed)
This is a 62 year old female who is status post panniculectomy on 03/19/2022 by Dr. Marla Roe.  She called our office for follow-up evaluation due to drainage around her drain site.  She notes that postoperatively she has done well with very little pain.  She does note that she had no significant bleeding and very little drain output postoperatively.  She did start her Lovenox yesterday morning and noticed that by the evening time her drain had put out more bloody output.  She notes that since yesterday she has drained the 100 cc bulb 3 times.  She notes that a good portion of the drainage has been coming around the drain site.  Denies any fever, dizziness, nausea, or any other concerning signs or symptoms.  Chaperone present.  Dressings in place along the lower abdomen, soft nontender with no palpable fluid collections.  She does have bloody drainage coming around the drain site, the bulb does have bloody output and is holding a charge.,  No surrounding redness or warmth.  Overall the patient has done well postoperatively.  She did initiate her Lovenox as DVT prophylaxis which is likely increasing her drain output.  Using sterile technique I did secure the drain with a Ethilon stitch, this seemed to help with the drainage around the drain site.  Dr. Marla Roe had the opportunity to personally evaluate the patient, she would recommend holding the Lovenox for the next 2 days, she may resume her Lovenox dose on Monday and follow-up in our clinic on Tuesday.  The patient will need to reach out to [place] immediately she develops any new or worsening signs or symptoms, I encouraged her to reach out to our office if her drain output increases or she has any questions whatsoever.  The patient verbalized understanding and agreement to today's plan had no further questions or concerns.

## 2022-03-22 ENCOUNTER — Other Ambulatory Visit: Payer: Self-pay

## 2022-03-22 ENCOUNTER — Inpatient Hospital Stay (HOSPITAL_COMMUNITY)
Admission: EM | Admit: 2022-03-22 | Discharge: 2022-03-28 | DRG: 812 | Disposition: A | Discharge status: Home / Self Care | Payer: Medicare HMO | Attending: Internal Medicine | Admitting: Internal Medicine

## 2022-03-22 ENCOUNTER — Encounter (HOSPITAL_COMMUNITY): Payer: Self-pay

## 2022-03-22 DIAGNOSIS — K644 Residual hemorrhoidal skin tags: Secondary | ICD-10-CM | POA: Diagnosis present

## 2022-03-22 DIAGNOSIS — D649 Anemia, unspecified: Secondary | ICD-10-CM | POA: Diagnosis not present

## 2022-03-22 DIAGNOSIS — Z6841 Body Mass Index (BMI) 40.0 and over, adult: Secondary | ICD-10-CM

## 2022-03-22 DIAGNOSIS — Z79899 Other long term (current) drug therapy: Secondary | ICD-10-CM

## 2022-03-22 DIAGNOSIS — Z803 Family history of malignant neoplasm of breast: Secondary | ICD-10-CM

## 2022-03-22 DIAGNOSIS — D62 Acute posthemorrhagic anemia: Principal | ICD-10-CM

## 2022-03-22 DIAGNOSIS — F418 Other specified anxiety disorders: Secondary | ICD-10-CM | POA: Diagnosis present

## 2022-03-22 DIAGNOSIS — D72829 Elevated white blood cell count, unspecified: Secondary | ICD-10-CM | POA: Diagnosis present

## 2022-03-22 DIAGNOSIS — K5900 Constipation, unspecified: Secondary | ICD-10-CM | POA: Diagnosis not present

## 2022-03-22 DIAGNOSIS — R000 Tachycardia, unspecified: Secondary | ICD-10-CM | POA: Diagnosis not present

## 2022-03-22 DIAGNOSIS — Z8249 Family history of ischemic heart disease and other diseases of the circulatory system: Secondary | ICD-10-CM

## 2022-03-22 DIAGNOSIS — K5909 Other constipation: Secondary | ICD-10-CM | POA: Diagnosis present

## 2022-03-22 DIAGNOSIS — R195 Other fecal abnormalities: Secondary | ICD-10-CM | POA: Diagnosis not present

## 2022-03-22 DIAGNOSIS — Z833 Family history of diabetes mellitus: Secondary | ICD-10-CM | POA: Diagnosis not present

## 2022-03-22 DIAGNOSIS — F419 Anxiety disorder, unspecified: Secondary | ICD-10-CM | POA: Diagnosis present

## 2022-03-22 DIAGNOSIS — F32A Depression, unspecified: Secondary | ICD-10-CM | POA: Diagnosis present

## 2022-03-22 DIAGNOSIS — E785 Hyperlipidemia, unspecified: Secondary | ICD-10-CM | POA: Diagnosis present

## 2022-03-22 DIAGNOSIS — G894 Chronic pain syndrome: Secondary | ICD-10-CM | POA: Diagnosis present

## 2022-03-22 DIAGNOSIS — I10 Essential (primary) hypertension: Secondary | ICD-10-CM | POA: Diagnosis present

## 2022-03-22 DIAGNOSIS — T45515A Adverse effect of anticoagulants, initial encounter: Secondary | ICD-10-CM | POA: Diagnosis present

## 2022-03-22 LAB — CBC WITH DIFFERENTIAL/PLATELET
Abs Immature Granulocytes: 0.04 10*3/uL (ref 0.00–0.07)
Basophils Absolute: 0 10*3/uL (ref 0.0–0.1)
Basophils Relative: 0 %
Eosinophils Absolute: 0 10*3/uL (ref 0.0–0.5)
Eosinophils Relative: 0 %
HCT: 22.4 % — ABNORMAL LOW (ref 36.0–46.0)
Hemoglobin: 7.1 g/dL — ABNORMAL LOW (ref 12.0–15.0)
Immature Granulocytes: 0 %
Lymphocytes Relative: 32 %
Lymphs Abs: 3.4 10*3/uL (ref 0.7–4.0)
MCH: 29.8 pg (ref 26.0–34.0)
MCHC: 31.7 g/dL (ref 30.0–36.0)
MCV: 94.1 fL (ref 80.0–100.0)
Monocytes Absolute: 0.8 10*3/uL (ref 0.1–1.0)
Monocytes Relative: 8 %
Neutro Abs: 6.4 10*3/uL (ref 1.7–7.7)
Neutrophils Relative %: 60 %
Platelets: 279 10*3/uL (ref 150–400)
RBC: 2.38 MIL/uL — ABNORMAL LOW (ref 3.87–5.11)
RDW: 13.6 % (ref 11.5–15.5)
WBC: 10.7 10*3/uL — ABNORMAL HIGH (ref 4.0–10.5)
nRBC: 0 % (ref 0.0–0.2)

## 2022-03-22 LAB — COMPREHENSIVE METABOLIC PANEL
ALT: 13 U/L (ref 0–44)
AST: 29 U/L (ref 15–41)
Albumin: 2.9 g/dL — ABNORMAL LOW (ref 3.5–5.0)
Alkaline Phosphatase: 59 U/L (ref 38–126)
Anion gap: 7 (ref 5–15)
BUN: 20 mg/dL (ref 6–20)
CO2: 27 mmol/L (ref 22–32)
Calcium: 8.9 mg/dL (ref 8.9–10.3)
Chloride: 103 mmol/L (ref 98–111)
Creatinine, Ser: 0.93 mg/dL (ref 0.44–1.00)
GFR, Estimated: >60 mL/min (ref >60)
Glucose, Bld: 108 mg/dL — ABNORMAL HIGH (ref 70–99)
Potassium: 5 mmol/L (ref 3.5–5.1)
Sodium: 137 mmol/L (ref 135–145)
Total Bilirubin: 1 mg/dL (ref 0.3–1.2)
Total Protein: 6.3 g/dL — ABNORMAL LOW (ref 6.5–8.1)

## 2022-03-22 LAB — PREPARE RBC (CROSSMATCH)

## 2022-03-22 MED ORDER — PANTOPRAZOLE SODIUM 40 MG IV SOLR
40.0000 mg | Freq: Once | INTRAVENOUS | Status: AC
  Administered 2022-03-22: 40 mg via INTRAVENOUS
  Filled 2022-03-22: qty 10

## 2022-03-22 MED ORDER — OXYCODONE HCL 5 MG PO TABS: 5.0000 mg | ORAL_TABLET | Freq: Three times a day (TID) | ORAL | Status: DC | PRN

## 2022-03-22 MED ORDER — ONDANSETRON HCL 4 MG PO TABS: 4.0000 mg | ORAL_TABLET | Freq: Four times a day (QID) | ORAL | Status: DC | PRN

## 2022-03-22 MED ORDER — CYCLOSPORINE 0.05 % OP EMUL
1.0000 [drp] | Freq: Two times a day (BID) | OPHTHALMIC | Status: DC
  Administered 2022-03-23 – 2022-03-28 (×9): 1 [drp] via OPHTHALMIC
  Filled 2022-03-22 (×12): qty 30

## 2022-03-22 MED ORDER — OXYCODONE-ACETAMINOPHEN 10-325 MG PO TABS: 1.0000 | ORAL_TABLET | Freq: Three times a day (TID) | ORAL | Status: DC | PRN

## 2022-03-22 MED ORDER — ACETAMINOPHEN 650 MG RE SUPP: 650.0000 mg | Freq: Four times a day (QID) | RECTAL | Status: DC | PRN

## 2022-03-22 MED ORDER — OXYCODONE-ACETAMINOPHEN 5-325 MG PO TABS: 1.0000 | ORAL_TABLET | Freq: Three times a day (TID) | ORAL | Status: DC | PRN

## 2022-03-22 MED ORDER — POLYETHYLENE GLYCOL 3350 17 G PO PACK: 17.0000 g | PACK | Freq: Every day | ORAL | Status: DC | PRN

## 2022-03-22 MED ORDER — LATANOPROST 0.005 % OP SOLN
1.0000 [drp] | Freq: Every day | OPHTHALMIC | Status: DC
  Administered 2022-03-23 – 2022-03-27 (×6): 1 [drp] via OPHTHALMIC
  Filled 2022-03-22: qty 2.5

## 2022-03-22 MED ORDER — CITALOPRAM HYDROBROMIDE 40 MG PO TABS
40.0000 mg | ORAL_TABLET | Freq: Every day | ORAL | Status: DC
  Administered 2022-03-23 – 2022-03-28 (×6): 40 mg via ORAL
  Filled 2022-03-22 (×4): qty 1
  Filled 2022-03-22 (×2): qty 4

## 2022-03-22 MED ORDER — PRAVASTATIN SODIUM 10 MG PO TABS
20.0000 mg | ORAL_TABLET | Freq: Every day | ORAL | Status: DC
  Administered 2022-03-23 – 2022-03-28 (×6): 20 mg via ORAL
  Filled 2022-03-22 (×6): qty 2

## 2022-03-22 MED ORDER — LACTATED RINGERS IV BOLUS
1000.0000 mL | Freq: Once | INTRAVENOUS | Status: AC
  Administered 2022-03-22: 1000 mL via INTRAVENOUS

## 2022-03-22 MED ORDER — SODIUM CHLORIDE 0.9% IV SOLUTION: Freq: Once | INTRAVENOUS | Status: AC

## 2022-03-22 MED ORDER — ONDANSETRON HCL 4 MG/2ML IJ SOLN: 4.0000 mg | Freq: Four times a day (QID) | INTRAMUSCULAR | Status: DC | PRN

## 2022-03-22 MED ORDER — SODIUM CHLORIDE 0.9% FLUSH
3.0000 mL | Freq: Two times a day (BID) | INTRAVENOUS | Status: DC
  Administered 2022-03-23 – 2022-03-28 (×10): 3 mL via INTRAVENOUS

## 2022-03-22 MED ORDER — CYCLOBENZAPRINE HCL 10 MG PO TABS
10.0000 mg | ORAL_TABLET | Freq: Three times a day (TID) | ORAL | Status: DC
  Administered 2022-03-23 – 2022-03-28 (×17): 10 mg via ORAL
  Filled 2022-03-22 (×17): qty 1

## 2022-03-22 MED ORDER — LURASIDONE HCL 20 MG PO TABS
60.0000 mg | ORAL_TABLET | Freq: Every day | ORAL | Status: DC
  Administered 2022-03-23 – 2022-03-27 (×6): 60 mg via ORAL
  Filled 2022-03-22 (×7): qty 3

## 2022-03-22 MED ORDER — KETOTIFEN FUMARATE 0.035 % OP SOLN
1.0000 [drp] | Freq: Two times a day (BID) | OPHTHALMIC | Status: DC
  Administered 2022-03-23 – 2022-03-28 (×9): 1 [drp] via OPHTHALMIC
  Filled 2022-03-22: qty 5

## 2022-03-22 MED ORDER — ACETAMINOPHEN 325 MG PO TABS
650.0000 mg | ORAL_TABLET | Freq: Four times a day (QID) | ORAL | Status: DC | PRN
  Administered 2022-03-27: 650 mg via ORAL
  Filled 2022-03-22: qty 2

## 2022-03-22 MED ORDER — BRIMONIDINE TARTRATE-TIMOLOL 0.2-0.5 % OP SOLN: 1.0000 [drp] | Freq: Two times a day (BID) | OPHTHALMIC | Status: DC

## 2022-03-22 NOTE — ED Notes (Signed)
Unsuccessful PIV start in [facility] IV team paged

## 2022-03-22 NOTE — ED Provider Notes (Signed)
[facility] Provider Note   CSN: [id number] Arrival date & time: 03/22/22  1624     History  Chief Complaint  Patient presents with   Tachycardia   Constipation    Carla Sloan is a 62 y.o. female.  62 year old female presents today for evaluation of constipation.  She recently underwent panniculectomy on 1/24.  She states she did have a bowel movement the day after but it was small amount.  However since that bowel movement she has not had any more.  She states she does feel that she needs to go.  She has been passing flatus.  Denies abdominal pain.  Denies nausea, or vomiting.  Has been tolerating p.o. intake without difficulty.  Denies any fever or chills.  Had her postop visit yesterday without any complications.  Is having adequate amount of output still through her drain.  This is bloody.  She was previously on Lovenox which was held for couple days after the postop visit yesterday.  She was told she could resume this on Monday.  She denies any chest pain, shortness of breath, or palpitations.  She was noted to be tachycardic on EMS arrival.    Constipation Associated symptoms: no abdominal pain, no nausea and no vomiting        Home Medications Prior to Admission medications   Medication Sig Start Date End Date Taking? Authorizing Provider  acetaminophen (TYLENOL) 500 MG tablet Take 1 tablet (500 mg total) by mouth every 6 (six) hours as needed. 05/11/20   Hughie Closs, PA-C  azelastine (OPTIVAR) 0.05 % ophthalmic solution Place 1 drop into both eyes 2 (two) times daily.    [provider]  bimatoprost (LUMIGAN) 0.03 % ophthalmic solution Place 1 drop into both eyes at bedtime.    [provider]  brimonidine-timolol (COMBIGAN) 0.2-0.5 % ophthalmic solution Place 1 drop into both eyes every 12 (twelve) hours.    [provider]  calcium citrate-vitamin D (CELEBRATE CALCIUM CITRATE)  500-500 MG-UNIT chewable tablet Chew 1 tablet by mouth every evening.    [provider]  CEQUA 0.09 % SOLN Place 1 drop into both eyes 2 (two) times daily. 11/17/19   [provider]  citalopram (CELEXA) 40 MG tablet Take 40 mg by mouth daily.    [provider]  cyclobenzaprine (FLEXERIL) 10 MG tablet Take 10 mg by mouth 3 (three) times daily. 11/12/21   [provider]  diclofenac Sodium (VOLTAREN) 1 % GEL Apply 4 g topically 4 (four) times daily. 08/02/20   Hazel Sams, PA-C  Elastic Bandages & Supports (WRIST SPLINT/COCK-UP/LEFT L) MISC Wear nightly and with exacerbating activities. 10/24/19   Patriciaann Clan, DO  Elastic Bandages & Supports (WRIST SPLINT/COCK-UP/RIGHT L) MISC Wear nightly and with exacerbating activities. 10/24/19   Patriciaann Clan, DO  enoxaparin (LOVENOX) 40 MG/0.4ML injection Inject 0.4 mLs (40 mg total) into the skin daily for 7 days. Recommend starting 24 hours after surgery. 03/12/22 03/19/22  Scheeler, Carola Rhine, PA-C  ergocalciferol (VITAMIN D2) 1.25 MG (50000 UT) capsule Take 50,000 Units by mouth once a week.    [provider]  ferrous sulfate 324 MG TBEC Take 324 mg by mouth daily with breakfast.    [provider]  lidocaine (LIDODERM) 5 % Place 1 patch onto the skin daily. Remove & Discard patch within 12 hours or as directed by MD 07/17/21   Raspet, Derry Skill, PA-C  Lurasidone HCl (LATUDA)  60 MG TABS Take 60 mg by mouth at bedtime.    [provider]  Multiple Vitamins-Minerals (CELEBRATE MULTI-COMPLETE 29) CHEW Chew 1 each by mouth daily.    [provider]  ondansetron (ZOFRAN) 4 MG tablet Take 1 tablet (4 mg total) by mouth every 8 (eight) hours as needed for nausea or vomiting. 03/07/22   Scheeler, Carola Rhine, PA-C  orphenadrine (NORFLEX) 100 MG tablet Take 1 tablet (100 mg total) by mouth 2 (two) times daily. 9/32/67   Delora Fuel, MD  oxyCODONE-acetaminophen (PERCOCET) 10-325 MG tablet Take 1  tablet by mouth twice a day as needed. 08/05/21     pravastatin (PRAVACHOL) 20 MG tablet Take 1 tablet (20 mg total) by mouth daily. 01/10/22   Henson, Laurian Brim, NP-C      Allergies    Patient has no known allergies.    Review of Systems   Review of Systems  Respiratory:  Negative for shortness of breath.   Cardiovascular:  Negative for chest pain and palpitations.  Gastrointestinal:  Positive for constipation. Negative for abdominal pain, nausea and vomiting.  Neurological:  Negative for syncope and light-headedness.  All other systems reviewed and are negative.   Physical Exam Updated Vital Signs BP (!) 147/83   Pulse (!) 132   Temp 97.9 F (36.6 C) (Oral)   Resp (!) 22   Ht '5\' 5"'$  (1.651 m)   Wt 117.5 kg   LMP 09/25/2015 (Approximate)   SpO2 100%   BMI 43.10 kg/m  Physical Exam Vitals and nursing note reviewed.  Constitutional:      General: She is not in acute distress.    Appearance: Normal appearance. She is not ill-appearing.  HENT:     Head: Normocephalic and atraumatic.     Nose: Nose normal.  Eyes:     General: No scleral icterus.    Extraocular Movements: Extraocular movements intact.     Conjunctiva/sclera: Conjunctivae normal.  Cardiovascular:     Rate and Rhythm: Normal rate and regular rhythm.     Pulses: Normal pulses.  Pulmonary:     Effort: Pulmonary effort is normal. No respiratory distress.     Breath sounds: Normal breath sounds. No wheezing or rales.  Abdominal:     General: There is no distension.     Palpations: Abdomen is soft.     Tenderness: There is no abdominal tenderness. There is no guarding.     Comments: Dressings in place over incisions.  Drain noted on the left side of the abdomen.  Drain with bloody output noted.  Currently about 50 cc.  Musculoskeletal:        General: Normal range of motion.     Cervical back: Normal range of motion.  Skin:    General: Skin is warm and dry.  Neurological:     General: No focal deficit  present.     Mental Status: She is alert. Mental status is at baseline.     ED Results / Procedures / Treatments   Labs (all labs ordered are listed, but only abnormal results are displayed) Labs Reviewed - No data to display  EKG None  Radiology No results found.  Procedures .Critical Care  Performed by: Evlyn Courier, PA-C Authorized by: Evlyn Courier, PA-C   Critical care provider statement:    Critical care time (minutes):  30   Critical care was necessary to treat or prevent imminent or life-threatening deterioration of the following conditions:  Circulatory failure   Critical care  was time spent personally by me on the following activities:  Development of treatment plan with patient or surrogate, discussions with consultants, evaluation of patient's response to treatment, examination of patient, ordering and review of laboratory studies, ordering and review of radiographic studies, ordering and performing treatments and interventions, pulse oximetry, re-evaluation of patient's condition and review of old charts   Care discussed with: admitting provider       Medications Ordered in ED Medications - No data to display  ED Course/ Medical Decision Making/ A&P                             Medical Decision Making Amount and/or Complexity of Data Reviewed Labs: ordered.   Medical Decision Making / ED Course   This patient presents to the ED for concern of constipation, this involves an extensive number of treatment options, and is a complaint that carries with it a high risk of complications and morbidity.  The differential diagnosis includes constipation, small bowel obstruction  MDM: 62 year old female presents today for evaluation of constipation.  Abdomen benign.  Recently status post panniculectomy on 1/24.  Bloody drainage noted in the drain with about 50 cc output.  She states she has been having about 300 cc worth daily since the time of the surgery.  Had about 2  doses of Lovenox following the surgery which was held yesterday.  Workup reveals CBC with no leukocytosis, anemia with hemoglobin of 7.1.  This is decreased from baseline of 12.  Most recent for comparison in care everywhere was from October which was 12.0.  Guaiac positive on rectal exam.   Brown stools on exam.  Following rectal exam she did have some bowel movement.  Not significant amount. Case discussed with plastic surgery PA Matt Scheeler who will evaluate patient in the morning.  States that abdominal binder be placed over the surgical sites.  Given the guaiac positive stools discussed with gastroenterology who will follow patient along during the admission.  Discussed with hospitalist will evaluate patient for admission.  Heart rate improved following fluid bolus.  Initially tachycardic.  Normotensive.  Anemia likely secondary to grossly bloody drainage from the surgical site.   Consultations obtained.  Gastroenterology, and plastic surgery.  See above for recs.   Additional history obtained: -Additional history obtained from visit with plastic surgery yesterday in clinic -External records from outside source obtained and reviewed including: Chart review including previous notes, labs, imaging, consultation notes   Lab Tests: -I ordered, reviewed, and interpreted labs.   The pertinent results include:   Labs Reviewed  CBC WITH DIFFERENTIAL/PLATELET - Abnormal; Notable for the following components:      Result Value   WBC 10.7 (*)    RBC 2.38 (*)    Hemoglobin 7.1 (*)    HCT 22.4 (*)    All other components within normal limits  COMPREHENSIVE METABOLIC PANEL - Abnormal; Notable for the following components:   Glucose, Bld 108 (*)    Total Protein 6.3 (*)    Albumin 2.9 (*)    All other components within normal limits      EKG  EKG Interpretation  Date/Time:    Ventricular Rate:    PR Interval:    QRS Duration:   QT Interval:    QTC Calculation:   R Axis:     Text  Interpretation:          Medicines ordered and prescription drug management:  Meds ordered this encounter  Medications   lactated ringers bolus 1,000 mL    -I have reviewed the patients home medicines and have made adjustments as needed  Critical interventions IV fluids   Reevaluation: After the interventions noted above, I reevaluated the patient and found that they have :improved  Co morbidities that complicate the patient evaluation  Past Medical History:  Diagnosis Date   Arthritis    Depression    Hypertension    Muscle spasms of lower extremity    and back      Dispostion: Patient discussed with hospitalist will evaluate patient for admission.  Final Clinical Impression(s) / ED Diagnoses Final diagnoses:  Acute blood loss anemia    Rx / DC Orders ED Discharge Orders     None         Evlyn Courier, PA-C 03/22/22 2335    Dorie Rank, MD 03/22/22 2342

## 2022-03-22 NOTE — Progress Notes (Signed)
Patient is a 62 year old female status post panniculectomy last week March 19, 2022 With Dr. Marla Roe. She was last seen in the clinic on Friday, yesterday, for evaluation and had increased drain output from her drain after starting her Lovenox injections for postoperative DVT prophylaxis, given her elevated Caprini score of 12.   She has since stopped the Lovenox, however, presented to emergency room tonight via EMS for const ipation, and was noted to be anemic with a hemoglobin of 7.1, down from 11.7.  Spoke with ED provider Evlyn Courier, PA-C. Notified me that pt will be admitted for transfusions and ongoing monitoring. Appreciate them notifying us. Recommend continue with compressive garment to Abdomen, continue to monitor JP drain output. I will plan to evaluate patient early in the a.m.   Notified Dr. Marla Roe of patient being in the [facility] y room and plans for admission. She agrees with plan for evaluation in the a.m. and continuing to monitor JP drain output and evaluate abdomen. Recommend holding any pharmacological DVT prophylaxis at this time  Personally called patient this evening at 10:45 PM, call forwarded to voicemail.

## 2022-03-22 NOTE — ED Notes (Addendum)
Hemmoccult = POSITIVE

## 2022-03-22 NOTE — H&P (Signed)
History and Physical    Carla Sloan FBP:[id number] DOB: Mar 31, 1960 DOA: 03/22/2022  PCP: Carla Rm, NP-C   Patient coming from: Home   Chief Complaint: Constipation    HPI: Carla Sloan is a pleasant 62 y.o. female with medical history significant for hypertension, depression, anxiety, BMI 43, and status post panniculectomy on 03/19/2022 who now presents to the emergency department with constipation and tachycardia.  Patient underwent panniculectomy on 03/19/2022, had increased bloody output from her surgical drain after starting prophylactic Lovenox.  She was instructed to hold the Lovenox on 03/21/2022 but has continued to experience bloody drainage.  She has also had difficulty moving her bowels since the surgery and states that she called EMS for this reason.  EMS found her heart rate to be in the 150s and brought her into the ED.  The patient denies any chest pain, palpitations, shortness of breath, lightheadedness, abdominal pain, melena, hematochezia, or hematemesis.  ED Course: Upon arrival to the ED, patient is found to be afebrile and saturating well on room air with elevated heart rate and labile blood pressures.  EKG demonstrates sinus tachycardia with rate 133.  Labs are most notable for hemoglobin 7.1, down from 11.7 in May 2023.  Fecal occult blood testing was positive (stool appeared normal per report of ED PA).  Type and screen was performed in the emergency department, patient was given IV Protonix and 1 L of LR, and gastroenterology and plastic surgery were consulted by the ED physician.  Review of Systems:  All other systems reviewed and apart from HPI, are negative.  Past Medical History:  Diagnosis Date   Arthritis    Depression    Hypertension    Muscle spasms of lower extremity    and back    Past Surgical History:  Procedure Laterality Date   ABDOMINOPLASTY/PANNICULECTOMY WITH LIPOSUCTION N/A 03/19/2022   Procedure: PANNICULECTOMY  WITH LIPOSUCTION;  Surgeon: Wallace Going, DO;  Location: [facility];  Service: Plastics;  Laterality: N/A;   COLONOSCOPY WITH PROPOFOL N/A 09/24/2012   Procedure: COLONOSCOPY WITH PROPOFOL;  Surgeon: Jeryl Columbia, MD;  Location: [facility] END[facility]COPY;  Service: Endoscopy;  Laterality: N/A;   COLONOSCOPY WITH PROPOFOL N/A 05/19/2016   Procedure: COLONOSCOPY WITH PROPOFOL;  Surgeon: Clarene Essex, MD;  Location: [facility] ENDOSCOPY;  Service: Endoscopy;  Laterality: N/A;   LAPAROSCOPIC GASTRIC SLEEVE RESECTION N/A 04/24/2020   Procedure: LAPAROSCOPIC GASTRIC SLEEVE RESECTION;  Surgeon: Clovis Riley, MD;  Location: [facility] ORS;  Service: General;  Laterality: N/A;   NO PAST SURGERIES     TUBAL LIGATION     UPPER GI ENDOSCOPY N/A 04/24/2020   Procedure: UPPER GI ENDOSCOPY;  Surgeon: Clovis Riley, MD;  Location: [facility] ORS;  Service: General;  Laterality: N/A;    Social History:   reports that she has never smoked. She has never used smokeless tobacco. She reports that she does not drink alcohol and does not use drugs.  No Known Allergies  Family History  Problem Relation Age of Onset   Hypertension Mother    Diabetes Mother    Breast cancer Mother    Diabetes Father    Hypertension Father      Prior to Admission medications   Medication Sig Start Date End Date Taking? Authorizing Provider  acetaminophen (TYLENOL) 500 MG tablet Take 1 tablet (500 mg total) by mouth every 6 (six) hours as needed. 05/11/20   Hughie Closs, PA-C  azelastine (OPTIVAR) 0.05 % ophthalmic solution  Place 1 drop into both eyes 2 (two) times daily.    [provider]  bimatoprost (LUMIGAN) 0.03 % ophthalmic solution Place 1 drop into both eyes at bedtime.    [provider]  brimonidine-timolol (COMBIGAN) 0.2-0.5 % ophthalmic solution Place 1 drop into both eyes every 12 (twelve) hours.    [provider]  calcium citrate-vitamin D (CELEBRATE CALCIUM CITRATE) 500-500 MG-UNIT chewable  tablet Chew 1 tablet by mouth every evening.    [provider]  CEQUA 0.09 % SOLN Place 1 drop into both eyes 2 (two) times daily. 11/17/19   [provider]  citalopram (CELEXA) 40 MG tablet Take 40 mg by mouth daily.    [provider]  cyclobenzaprine (FLEXERIL) 10 MG tablet Take 10 mg by mouth 3 (three) times daily. 11/12/21   [provider]  diclofenac Sodium (VOLTAREN) 1 % GEL Apply 4 g topically 4 (four) times daily. 08/02/20   Hazel Sams, PA-C  Elastic Bandages & Supports (WRIST SPLINT/COCK-UP/LEFT L) MISC Wear nightly and with exacerbating activities. 10/24/19   Patriciaann Clan, DO  Elastic Bandages & Supports (WRIST SPLINT/COCK-UP/RIGHT L) MISC Wear nightly and with exacerbating activities. 10/24/19   Patriciaann Clan, DO  enoxaparin (LOVENOX) 40 MG/0.4ML injection Inject 0.4 mLs (40 mg total) into the skin daily for 7 days. Recommend starting 24 hours after surgery. 03/12/22 03/19/22  Scheeler, Carola Rhine, PA-C  ergocalciferol (VITAMIN D2) 1.25 MG (50000 UT) capsule Take 50,000 Units by mouth once a week.    [provider]  ferrous sulfate 324 MG TBEC Take 324 mg by mouth daily with breakfast.    [provider]  lidocaine (LIDODERM) 5 % Place 1 patch onto the skin daily. Remove & Discard patch within 12 hours or as directed by MD 07/17/21   Raspet, Derry Skill, PA-C  Lurasidone HCl (LATUDA) 60 MG TABS Take 60 mg by mouth at bedtime.    [provider]  Multiple Vitamins-Minerals (CELEBRATE MULTI-COMPLETE 29) CHEW Chew 1 each by mouth daily.    [provider]  ondansetron (ZOFRAN) 4 MG tablet Take 1 tablet (4 mg total) by mouth every 8 (eight) hours as needed for nausea or vomiting. 03/07/22   Scheeler, Carola Rhine, PA-C  orphenadrine (NORFLEX) 100 MG tablet Take 1 tablet (100 mg total) by mouth 2 (two) times daily. 08/25/61   Delora Fuel, MD  oxyCODONE-acetaminophen (PERCOCET) 10-325 MG tablet Take 1 tablet by mouth twice a  day as needed. 08/05/21     pravastatin (PRAVACHOL) 20 MG tablet Take 1 tablet (20 mg total) by mouth daily. 01/10/22   Carla Rm, NP-C    Physical Exam: Vitals:   03/22/22 2228 03/22/22 2230 03/22/22 2300 03/22/22 2324  BP:   (!) 102/53   Pulse:  (!) 132 88   Resp:  (!) 24 14   Temp:    98.7 F (37.1 C)  TempSrc:    Oral  SpO2: 100% 100% 100%   Weight:      Height:        Constitutional: NAD, calm  Eyes: PERTLA, lids and conjunctivae normal ENMT: Mucous membranes are moist. Posterior pharynx clear of any exudate or lesions.   Neck: supple, no masses  Respiratory: no wheezing, no crackles. No accessory muscle use.  Cardiovascular: S1 & S2 heard, regular rate and rhythm. No extremity edema. No JVD. Abdomen: No distension, soft, surgical incisions well-approximated, bloody fluid from drain. Bowel sounds active.  Musculoskeletal: no clubbing /  cyanosis. No joint deformity upper and lower extremities.   Skin: no significant rashes, lesions, ulcers. Warm, dry, well-perfused. Neurologic: no dysarthria or aphasia. Moving all extremities. Alert and oriented.  Psychiatric: Pleasant. Cooperative.    Labs and Imaging on Admission: I have personally reviewed following labs and imaging studies  CBC: Recent Labs  Lab 03/22/22 1708  WBC 10.7*  NEUTROABS 6.4  HGB 7.1*  HCT 22.4*  MCV 94.1  PLT 161   Basic Metabolic Panel: Recent Labs  Lab 03/22/22 1708  NA 137  K 5.0  CL 103  CO2 27  GLUCOSE 108*  BUN 20  CREATININE 0.93  CALCIUM 8.9   GFR: Estimated Creatinine Clearance: 82.5 mL/min (by C-G formula based on SCr of 0.93 mg/dL). Liver Function Tests: Recent Labs  Lab 03/22/22 1708  AST 29  ALT 13  ALKPHOS 59  BILITOT 1.0  PROT 6.3*  ALBUMIN 2.9*   No results for input(s): "LIPASE", "AMYLASE" in the last 168 hours. No results for input(s): "AMMONIA" in the last 168 hours. Coagulation Profile: No results for input(s): "INR", "PROTIME" in the last 168  hours. Cardiac Enzymes: No results for input(s): "CKTOTAL", "CKMB", "CKMBINDEX", "TROPONINI" in the last 168 hours. BNP (last 3 results) No results for input(s): "PROBNP" in the last 8760 hours. HbA1C: No results for input(s): "HGBA1C" in the last 72 hours. CBG: No results for input(s): "GLUCAP" in the last 168 hours. Lipid Profile: No results for input(s): "CHOL", "HDL", "LDLCALC", "TRIG", "CHOLHDL", "LDLDIRECT" in the last 72 hours. Thyroid Function Tests: No results for input(s): "TSH", "T4TOTAL", "FREET4", "T3FREE", "THYROIDAB" in the last 72 hours. Anemia Panel: No results for input(s): "VITAMINB12", "FOLATE", "FERRITIN", "TIBC", "IRON", "RETICCTPCT" in the last 72 hours. Urine analysis:    Component Value Date/Time   COLORURINE YELLOW 07/20/2021 0325   APPEARANCEUR CLEAR 07/20/2021 0325   LABSPEC 1.011 07/20/2021 0325   PHURINE 7.0 07/20/2021 0325   GLUCOSEU NEGATIVE 07/20/2021 0325   HGBUR NEGATIVE 07/20/2021 0325   BILIRUBINUR NEGATIVE 07/20/2021 0325   KETONESUR NEGATIVE 07/20/2021 0325   PROTEINUR NEGATIVE 07/20/2021 0325   UROBILINOGEN 0.2 07/17/2021 1617   NITRITE NEGATIVE 07/20/2021 0325   LEUKOCYTESUR MODERATE (A) 07/20/2021 0325   Sepsis Labs: '@LABRCNTIP'$ (procalcitonin:4,lacticidven:4) )No results found for this or any previous visit (from the past 240 hour(s)).   Radiological Exams on Admission: No results found.  EKG: Independently reviewed. Sinus tachycardia, rate 133, LVH with repolarization abnormality.   Assessment/Plan   1. Symptomatic anemia; postop bleeding; occult GI bleeding - Hgb is 7.1, down from 11.7 in May 2023   - FOBT is positive but stool appeared normal per ED PA and patient denies melena or hematochezia  - She has had significant bloody output from her surgical drain and this likely accounts for the majority of her blood loss  - Appreciate plastic surgery and GI consultants  - Plan for bowel rest, hold pharmacologic VTE ppx, transfuse 1  unit RBC, trend H&H, monitor drain output, and follow-up on recommendations from plastics and GI    2. Depression, anxiety  - Continue Latuda and Celexa    3. Chronic pain  - Prescription database reviewed, plan to continue Percocet and Flexeril   4. Hx of HTN  - Resolved with wt-loss, no longer on antihypertensives    DVT prophylaxis: SCDs  Code Status: Full  Level of Care: Level of care: Telemetry Surgical Family Communication: None present   Disposition Plan:  Patient is from: home  Anticipated d/c is to: Home  Anticipated d/c date is: 1/28 or 03/24/22  Patient currently: Pending stable H&H  Consults called: Plastic surgery and GI consulted by ED PA  Admission status: Observation     Vianne Bulls, MD [facility]  03/22/2022, 11:41 PM

## 2022-03-22 NOTE — ED Triage Notes (Signed)
Pt BIB EMS for constipation x3 days following recent ABD surgery, EMS found pt to have HR in 150's pt denies CP  and [facility]

## 2022-03-23 DIAGNOSIS — D649 Anemia, unspecified: Secondary | ICD-10-CM | POA: Diagnosis not present

## 2022-03-23 LAB — BASIC METABOLIC PANEL
Anion gap: 8 (ref 5–15)
BUN: 13 mg/dL (ref 6–20)
CO2: 25 mmol/L (ref 22–32)
Calcium: 8.7 mg/dL — ABNORMAL LOW (ref 8.9–10.3)
Chloride: 105 mmol/L (ref 98–111)
Creatinine, Ser: 0.82 mg/dL (ref 0.44–1.00)
GFR, Estimated: >60 mL/min (ref >60)
Glucose, Bld: 109 mg/dL — ABNORMAL HIGH (ref 70–99)
Potassium: 4.3 mmol/L (ref 3.5–5.1)
Sodium: 138 mmol/L (ref 135–145)

## 2022-03-23 LAB — CBC
HCT: 25.3 % — ABNORMAL LOW (ref 36.0–46.0)
Hemoglobin: 8.3 g/dL — ABNORMAL LOW (ref 12.0–15.0)
MCH: 30.3 pg (ref 26.0–34.0)
MCHC: 32.8 g/dL (ref 30.0–36.0)
MCV: 92.3 fL (ref 80.0–100.0)
Platelets: 282 10*3/uL (ref 150–400)
RBC: 2.74 MIL/uL — ABNORMAL LOW (ref 3.87–5.11)
RDW: 13.9 % (ref 11.5–15.5)
WBC: 10.8 10*3/uL — ABNORMAL HIGH (ref 4.0–10.5)
nRBC: 0 % (ref 0.0–0.2)

## 2022-03-23 LAB — MAGNESIUM: Magnesium: 2 mg/dL (ref 1.7–2.4)

## 2022-03-23 MED ORDER — POLYETHYLENE GLYCOL 3350 17 G PO PACK
17.0000 g | PACK | Freq: Two times a day (BID) | ORAL | Status: DC
  Administered 2022-03-23 – 2022-03-28 (×8): 17 g via ORAL
  Filled 2022-03-23 (×8): qty 1

## 2022-03-23 MED ORDER — BISACODYL 5 MG PO TBEC: 5.0000 mg | DELAYED_RELEASE_TABLET | Freq: Every day | ORAL | Status: DC | PRN

## 2022-03-23 MED ORDER — BISACODYL 10 MG RE SUPP
10.0000 mg | Freq: Once | RECTAL | Status: DC
  Filled 2022-03-23: qty 1

## 2022-03-23 MED ORDER — TIMOLOL MALEATE 0.5 % OP SOLN
1.0000 [drp] | Freq: Two times a day (BID) | OPHTHALMIC | Status: DC
  Administered 2022-03-23 – 2022-03-28 (×11): 1 [drp] via OPHTHALMIC
  Filled 2022-03-23 (×2): qty 5

## 2022-03-23 MED ORDER — BRIMONIDINE TARTRATE 0.2 % OP SOLN
1.0000 [drp] | Freq: Two times a day (BID) | OPHTHALMIC | Status: DC
  Administered 2022-03-23 – 2022-03-28 (×11): 1 [drp] via OPHTHALMIC
  Filled 2022-03-23 (×3): qty 5

## 2022-03-23 MED ORDER — SENNOSIDES-DOCUSATE SODIUM 8.6-50 MG PO TABS
1.0000 | ORAL_TABLET | Freq: Two times a day (BID) | ORAL | Status: DC
  Administered 2022-03-23 – 2022-03-28 (×9): 1 via ORAL
  Filled 2022-03-23 (×9): qty 1

## 2022-03-23 MED ORDER — LACTATED RINGERS IV SOLN: INTRAVENOUS | Status: DC

## 2022-03-23 NOTE — Consult Note (Signed)
[facility]  Reason for Consult: Acute blood loss anemia status post panniculectomy Referring Physician: Evlyn Courier, PA-C  Carla Sloan is an 62 y.o. female.  HPI: 62 year old female presented to the [facility] emergency department via EMS yesterday evening for concerns over constipation. She was found to be tachycardic and anemic with a hemoglobin of 7.1, down from 11.7 at her baseline.  She was seen in the clinic on Friday by one of our providers, recommendation was to stop the Lovenox due to increased drain output after starting the Lovenox.   I spoke with ED provider Evlyn Courier, PA-C yesterday evening to discuss patient.  They were planning for admission with hospitalist team.  Planning for 1 PRBC.  Patient had 1 PRBC last night, hemoglobin this a.m. up to 8.3 from 7.1.  Patient is resting in bed this a.m., reports she is doing well.  She reports her biggest concern at this point is being constipated, she denies any lightheadedness, dizziness. She denies any other symptoms, has no cardiac symptoms.  She reports she has had difficulty keeping the abdominal binder on due to the binder continuing to ride up above the abdominal surgical site.  Past Medical History:  Diagnosis Date   Arthritis    Depression    Hypertension    Muscle spasms of lower extremity    and back    Past Surgical History:  Procedure Laterality Date   ABDOMINOPLASTY/PANNICULECTOMY WITH LIPOSUCTION N/A 03/19/2022   Procedure: PANNICULECTOMY WITH LIPOSUCTION;  Surgeon: Wallace Going, DO;  Location: [facility];  Service: Plastics;  Laterality: N/A;   COLONOSCOPY WITH PROPOFOL N/A 09/24/2012   Procedure: COLONOSCOPY WITH PROPOFOL;  Surgeon: Jeryl Columbia, MD;  Location: [facility] ENDOSCOPY;  Service: Endoscopy;  Laterality: N/A;   COLONOSCOPY WITH PROPOFOL N/A 05/19/2016   Procedure: COLONOSCOPY WITH PROPOFOL;  Surgeon: Clarene Essex, MD;  Location: [facility] ENDOSCOPY;  Service:  Endoscopy;  Laterality: N/A;   LAPAROSCOPIC GASTRIC SLEEVE RESECTION N/A 04/24/2020   Procedure: LAPAROSCOPIC GASTRIC SLEEVE RESECTION;  Surgeon: Clovis Riley, MD;  Location: [facility];  Service: General;  Laterality: N/A;   NO PAST SURGERIES     TUBAL LIGATION     UPPER GI ENDOSCOPY N/A 04/24/2020   Procedure: UPPER GI ENDOSCOPY;  Surgeon: Clovis Riley, MD;  Location: [facility];  Service: General;  Laterality: N/A;    Family History  Problem Relation Age of Onset   Hypertension Mother    Diabetes Mother    Breast cancer Mother    Diabetes Father    Hypertension Father     Social History:  reports that she has never smoked. She has never used smokeless tobacco. She reports that she does not drink alcohol and does not use drugs.  Allergies: No Known Allergies  Medications: I have reviewed the patient's current medications.  Results for orders placed or performed during the hospital encounter of 03/22/22 (from the past 48 hour(s))  CBC with Differential     Status: Abnormal   Collection Time: 03/22/22  5:08 PM  Result Value Ref Range   WBC 10.7 (H) 4.0 - 10.5 K/uL   RBC 2.38 (L) 3.87 - 5.11 MIL/uL   Hemoglobin 7.1 (L) 12.0 - 15.0 g/dL   HCT 22.4 (L) 36.0 - 46.0 %   MCV 94.1 80.0 - 100.0 fL   MCH 29.8 26.0 - 34.0 pg   MCHC 31.7 30.0 - 36.0 g/dL   RDW 13.6 11.5 - 15.5 %  Platelets 279 150 - 400 K/uL   nRBC 0.0 0.0 - 0.2 %   Neutrophils Relative % 60 %   Neutro Abs 6.4 1.7 - 7.7 K/uL   Lymphocytes Relative 32 %   Lymphs Abs 3.4 0.7 - 4.0 K/uL   Monocytes Relative 8 %   Monocytes Absolute 0.8 0.1 - 1.0 K/uL   Eosinophils Relative 0 %   Eosinophils Absolute 0.0 0.0 - 0.5 K/uL   Basophils Relative 0 %   Basophils Absolute 0.0 0.0 - 0.1 K/uL   Immature Granulocytes 0 %   Abs Immature Granulocytes 0.04 0.00 - 0.07 K/uL    Comment: Performed at [facility] [address], [place], [place] [address]  Comprehensive metabolic panel     Status: Abnormal   Collection  Time: 03/22/22  5:08 PM  Result Value Ref Range   Sodium 137 135 - 145 mmol/L   Potassium 5.0 3.5 - 5.1 mmol/L    Comment: HEMOLYSIS AT THIS LEVEL MAY AFFECT RESULT   Chloride 103 98 - 111 mmol/L   CO2 27 22 - 32 mmol/L   Glucose, Bld 108 (H) 70 - 99 mg/dL    Comment: Glucose reference range applies only to samples taken after fasting for at least 8 hours.   BUN 20 6 - 20 mg/dL   Creatinine, Ser 0.93 0.44 - 1.00 mg/dL   Calcium 8.9 8.9 - 10.3 mg/dL   Total Protein 6.3 (L) 6.5 - 8.1 g/dL   Albumin 2.9 (L) 3.5 - 5.0 g/dL   AST 29 15 - 41 U/L    Comment: HEMOLYSIS AT THIS LEVEL MAY AFFECT RESULT   ALT 13 0 - 44 U/L    Comment: HEMOLYSIS AT THIS LEVEL MAY AFFECT RESULT   Alkaline Phosphatase 59 38 - 126 U/L   Total Bilirubin 1.0 0.3 - 1.2 mg/dL    Comment: HEMOLYSIS AT THIS LEVEL MAY AFFECT RESULT   GFR, Estimated >60 >60 mL/min    Comment: (NOTE) Calculated using the CKD-EPI Creatinine Equation (2021)    Anion gap 7 5 - 15    Comment: Performed at [facility], [place] [address] [place], [place] [address]  Type and screen Daniel     Status: None (Preliminary result)   Collection Time: 03/22/22 10:00 PM  Result Value Ref Range   ABO/RH(D) O POS    Antibody Screen NEG    Sample Expiration 03/25/2022,2359    Unit Number [id number]    Blood Component Type RED CELLS,LR    Unit division 00    Status of Unit ISSUED    Transfusion Status OK TO TRANSFUSE    Crossmatch Result      Compatible Performed at [facility], [place] [address], [place], [place] [address]   Prepare RBC (crossmatch)     Status: None   Collection Time: 03/22/22 11:41 PM  Result Value Ref Range   Order Confirmation      ORDER PROCESSED BY BLOOD BANK Performed at [facility], [place] [address] [place], [place] [address]   Basic metabolic panel     Status: Abnormal   Collection Time: 03/23/22  5:00 AM  Result Value Ref Range   Sodium 138 135 - 145 mmol/L   Potassium 4.3  3.5 - 5.1 mmol/L   Chloride 105 98 - 111 mmol/L   CO2 25 22 - 32 mmol/L   Glucose, Bld 109 (H) 70 - 99 mg/dL    Comment:  Glucose reference range applies only to samples taken after fasting for at least 8 hours.   BUN 13 6 - 20 mg/dL   Creatinine, Ser 0.82 0.44 - 1.00 mg/dL   Calcium 8.7 (L) 8.9 - 10.3 mg/dL   GFR, Estimated >60 >60 mL/min    Comment: (NOTE) Calculated using the CKD-EPI Creatinine Equation (2021)    Anion gap 8 5 - 15    Comment: Performed at [facility] [address] [place], [place] [address]  Magnesium     Status: None   Collection Time: 03/23/22  5:00 AM  Result Value Ref Range   Magnesium 2.0 1.7 - 2.4 mg/dL    Comment: Performed at [facility] [address], [place], [place] [address]  CBC     Status: Abnormal   Collection Time: 03/23/22  8:51 AM  Result Value Ref Range   WBC 10.8 (H) 4.0 - 10.5 K/uL   RBC 2.74 (L) 3.87 - 5.11 MIL/uL   Hemoglobin 8.3 (L) 12.0 - 15.0 g/dL   HCT 25.3 (L) 36.0 - 46.0 %   MCV 92.3 80.0 - 100.0 fL   MCH 30.3 26.0 - 34.0 pg   MCHC 32.8 30.0 - 36.0 g/dL   RDW 13.9 11.5 - 15.5 %   Platelets 282 150 - 400 K/uL   nRBC 0.0 0.0 - 0.2 %    Comment: Performed at [facility], [place] [address] [place], [place] [address]    No results found.  Review of Systems  Constitutional:  Negative for chills, fever and malaise/fatigue.  Respiratory: Negative.    Cardiovascular:  Negative for chest pain.  Gastrointestinal:  Positive for constipation. Negative for abdominal pain and heartburn.  Genitourinary: Negative.   Neurological:  Negative for dizziness and weakness.   Blood pressure (!) 80/57, pulse 88, temperature 98.8 F (37.1 C), temperature source Oral, resp. rate (!) 21, height '5\' 5"'$  (1.651 m), weight 117.5 kg, last menstrual period 09/25/2015, SpO2 100 %. Physical Exam Constitutional:      General: She is not in acute distress.    Appearance: Normal appearance. She is obese. She is not ill-appearing,  toxic-appearing or diaphoretic.  HENT:     Head: Normocephalic and atraumatic.  Pulmonary:     Effort: Pulmonary effort is normal.  Abdominal:     General: Abdomen is flat. There is no distension.     Palpations: Abdomen is soft.     Tenderness: There is no abdominal tenderness. There is no guarding.     Comments: Abdominal incision is intact, she does have some mild ecchymosis noted in the peri-incisional area.  She has some ecchymosis of the right upper thigh.  There is no cellulitic changes or erythema noted.  Mepilex border dressings in place without any significant drainage noted.  Left JP drain in place with some oozing drainage noted around the drain site.  Upon initial evaluation, 80 cc of dark sanguinous fluid was in the JP drain bulb.  She has no tenderness or discomfort with palpation of her abdomen, I do not appreciate any fluid collections or hematoma.  Neurological:     Mental Status: She is alert.     Assessment/Plan: Status post panniculectomy complicated by acute blood loss anemia:  62 year old female status post panniculectomy 03/19/2022, postop day 4.  She initially was doing well, however reports after starting the Lovenox for DVT prophylaxis given her elevated Caprini score she started to have increased drainage from the left JP  drain.  She had 2 doses of Lovenox, last dose Friday, 03/21/2022.  Hemoglobin slightly up from initial labs upon evaluation in the ED, 7.1 --> 8.3.  Management per primary team, appreciate their help.  In regards to her surgical site, recommend changing the abdominal binder to a slightly larger binder if able due to the difficulty of the current binder staying over the surgical site.  The compression for the binder will help decrease surgical space    She does seem to continue to have some drainage from the left JP drain. This a.m. I was able to drain approximately 200 cc of dark sanguinous fluid from the left JP drain.  There did not seem to be  any active drainage after removing the 200 cc.  DVT prophylaxis per primary -given the acute blood loss anemia, likely SCDs despite her elevated Caprini score.  After drainage has slowed, may be able to restart Lovenox, but will continue to follow and evaluate.  Spoke with patient's daughter via telephone at the bedside, answered any questions she had.  Carola Rhine Telma Pyeatt, PA-C 03/23/2022, 9:27 AM

## 2022-03-23 NOTE — ED Notes (Signed)
Patient had a bowel movement 

## 2022-03-23 NOTE — Progress Notes (Signed)
PROGRESS NOTE    Carla Sloan  OJJ:[id number] DOB: 1960-07-14 DOA: 03/22/2022 PCP: [name], NP-C   Brief Narrative: 62 year old with past medical history significant for hypertension, depression, anxiety, BMI 43, status post panniculectomy on 03/19/2022 presents to the emergency department with constipation and tachycardia.  Patient underwent panniculectomy on 03/19/2022, had increased bloody output from her surgical drain after starting prophylactic Lovenox.  She was instructed to hold Lovenox on 03/21/2022 but has continued to experience bloody drainage.  She has not been able to had a bowel movement since the surgery.  On EMS evaluation her heart rate was 150.  On arrival to the ED she was found to have tachycardia heart rate 133, hemoglobin 7.1 down from 04 Jul 2021.  Fecal occult blood test was positive. Gastroenterologist and plastic surgery were consulted by ED physician.   Assessment & Plan:   Principal Problem:   Symptomatic anemia Active Problems:   Occult GI bleeding   Depression with anxiety   Postoperative anemia due to acute blood loss   1-Symptomatic anemia, Post Operative Bleeding,  Occult GI bleed: -Patient presented with a hemoglobin of 7 down from 04 Jul 2021. -FOBT positive.  Normal stool. -She has had significant bloody output from her surgical drain and this likely accounts for the majority of her blood clots. -Plastic surgery and GI consulted. -1 unit of packed red blood cell order -Monitor hemoglobin IV fluids.   Depression, anxiety Continue with Latuda and Celexa   Chronic pain syndrome: Continue with Percocet and Flexeril   History of hypertension Resolved with weight loss, no longer on antihypertensive.  Constipation start Miralax.     Estimated body mass index is 43.1 kg/m as calculated from the following:   Height as of this encounter: '5\' 5"'$  (1.651 m).   Weight as of this encounter: 117.5 kg.   DVT prophylaxis:  SCD Code Status: Full code Family Communication: Care discussed with patient Disposition Plan:  Status is: Observation The patient remains OBS appropriate and will d/c before 2 midnights.    Consultants:  Plastic Sx GI  Procedures:    Antimicrobials:    Subjective: She is feeling ok, report constipation. Has 50 cc from JP since 7 am.   Objective: Vitals:   03/23/22 0545 03/23/22 0600 03/23/22 0630 03/23/22 0633  BP: (!) 112/57 (!) 105/56 110/65   Pulse: 84 85 84   Resp: (!) '21 17 17   '$ Temp:    98.8 F (37.1 C)  TempSrc:    Oral  SpO2: 100% 100% 100%   Weight:      Height:        Intake/Output Summary (Last 24 hours) at 03/23/2022 0704 Last data filed at 03/23/2022 0602 Gross per 24 hour  Intake 1335.75 ml  Output 200 ml  Net 1135.75 ml   Filed Weights   03/22/22 1635  Weight: 117.5 kg    Examination:  General exam: Appears calm and comfortable  Respiratory system: Clear to auscultation. Respiratory effort normal. Cardiovascular system: S1 & S2 heard, RRR. No JVD, murmurs, rubs, gallops or clicks. No pedal edema. Gastrointestinal system: Abdomen is soft, mild tender, JP in  place.   Data Reviewed: I have personally reviewed following labs and imaging studies  CBC: Recent Labs  Lab 03/22/22 1708  WBC 10.7*  NEUTROABS 6.4  HGB 7.1*  HCT 22.4*  MCV 94.1  PLT 937   Basic Metabolic Panel: Recent Labs  Lab 03/22/22 1708 03/23/22 0500  NA 137 138  K  5.0 4.3  CL 103 105  CO2 27 25  GLUCOSE 108* 109*  BUN 20 13  CREATININE 0.93 0.82  CALCIUM 8.9 8.7*  MG  --  2.0   GFR: Estimated Creatinine Clearance: 93.5 mL/min (by C-G formula based on SCr of 0.82 mg/dL). Liver Function Tests: Recent Labs  Lab 03/22/22 1708  AST 29  ALT 13  ALKPHOS 59  BILITOT 1.0  PROT 6.3*  ALBUMIN 2.9*   No results for input(s): "LIPASE", "AMYLASE" in the last 168 hours. No results for input(s): "AMMONIA" in the last 168 hours. Coagulation Profile: No results  for input(s): "INR", "PROTIME" in the last 168 hours. Cardiac Enzymes: No results for input(s): "CKTOTAL", "CKMB", "CKMBINDEX", "TROPONINI" in the last 168 hours. BNP (last 3 results) No results for input(s): "PROBNP" in the last 8760 hours. HbA1C: No results for input(s): "HGBA1C" in the last 72 hours. CBG: No results for input(s): "GLUCAP" in the last 168 hours. Lipid Profile: No results for input(s): "CHOL", "HDL", "LDLCALC", "TRIG", "CHOLHDL", "LDLDIRECT" in the last 72 hours. Thyroid Function Tests: No results for input(s): "TSH", "T4TOTAL", "FREET4", "T3FREE", "THYROIDAB" in the last 72 hours. Anemia Panel: No results for input(s): "VITAMINB12", "FOLATE", "FERRITIN", "TIBC", "IRON", "RETICCTPCT" in the last 72 hours. Sepsis Labs: No results for input(s): "PROCALCITON", "LATICACIDVEN" in the last 168 hours.  No results found for this or any previous visit (from the past 240 hour(s)).       Radiology Studies: No results found.      Scheduled Meds:  brimonidine  1 drop Both Eyes Q12H   citalopram  40 mg Oral Daily   cyclobenzaprine  10 mg Oral TID   cycloSPORINE  1 drop Both Eyes BID   ketotifen  1 drop Both Eyes BID   latanoprost  1 drop Both Eyes QHS   lurasidone  60 mg Oral QHS   pravastatin  20 mg Oral Daily   sodium chloride flush  3 mL Intravenous Q12H   timolol  1 drop Both Eyes BID   Continuous Infusions:   LOS: 0 days    Time spent: 35 minutes     Quinterious Walraven A Elleah Hemsley, MD [facility]   If 7PM-7AM, please contact night-coverage [email or web address]  03/23/2022, 7:04 AM

## 2022-03-23 NOTE — ED Notes (Signed)
CONSENT FOR BLOOD DID NOT DETECT MY SIGNATURE FOR A WITNESS BUT I DID WITNESS Carla Sloan CONSENT FOR BLOOD TRANSFUSION

## 2022-03-23 NOTE — Consult Note (Signed)
[facility] Gastroenterology Consult  Referring Provider: No ref. provider found Primary Care Physician:  [name], NP-C Primary Gastroenterologist: [facility], Dr. Watt Climes  Reason for Consultation: Heme positive stool, anemia, constipation  SUBJECTIVE:   HPI: Carla Sloan is a 62 y.o. female with past medical history significant for hypertension, depression, she underwent panniculectomy on 03/19/2022 by plastic surgery.  She noted having normal bowel movements prior to surgery, brown in color.  No blood per rectum.  Her last bowel movement was day prior to surgery.  She started subcutaneous Lovenox day after surgery and noted having increased bloody output in JP drain.  She followed up with her plastic surgeon in office on 03/21/2022 for increased drainage around JP drain, had suture placed.  She presented to [facility] emergency department on 03/22/2022 with chief complaint of constipation after her abdominal surgery.  Last colonoscopy was completed on 07/24/2021 by Dr. Watt Climes for personal history of polyps with findings of hemorrhoids, normal-appearing colonic mucosa, no specimens obtained.  Hemoglobin on presentation 7.1 (was 11.7 on 07/05/2021), WBC 10.7, platelet 279, BUN/creatinine 13/0.82, AST/ALT 29/13, alkaline phosphatase 59, total bilirubin 1.0.  No abdominal imaging was obtained.  Patient denied any abdominal pain.  She denied nausea or vomiting.  She feels that she has a hard stool in her rectum that is difficult to come out.  No chest pain or shortness of breath.  She denied taking pain medications postoperatively.  Past Medical History:  Diagnosis Date   Arthritis    Depression    Hypertension    Muscle spasms of lower extremity    and back   Past Surgical History:  Procedure Laterality Date   ABDOMINOPLASTY/PANNICULECTOMY WITH LIPOSUCTION N/A 03/19/2022   Procedure: PANNICULECTOMY WITH LIPOSUCTION;  Surgeon: Wallace Going, DO;  Location: [place];  Service: Plastics;  Laterality: N/A;   COLONOSCOPY WITH PROPOFOL N/A 09/24/2012   Procedure: COLONOSCOPY WITH PROPOFOL;  Surgeon: Jeryl Columbia, MD;  Location: [facility] ENDOSCOPY;  Service: Endoscopy;  Laterality: N/A;   COLONOSCOPY WITH PROPOFOL N/A 05/19/2016   Procedure: COLONOSCOPY WITH PROPOFOL;  Surgeon: Clarene Essex, MD;  Location: [facility] ENDOSCOPY;  Service: Endoscopy;  Laterality: N/A;   LAPAROSCOPIC GASTRIC SLEEVE RESECTION N/A 04/24/2020   Procedure: LAPAROSCOPIC GASTRIC SLEEVE RESECTION;  Surgeon: Clovis Riley, MD;  Location: [facility] ORS;  Service: General;  Laterality: N/A;   NO PAST SURGERIES     TUBAL LIGATION     UPPER GI ENDOSCOPY N/A 04/24/2020   Procedure: UPPER GI ENDOSCOPY;  Surgeon: Clovis Riley, MD;  Location: [facility] ORS;  Service: General;  Laterality: N/A;   Prior to Admission medications   Medication Sig Start Date End Date Taking? Authorizing Provider  acetaminophen (TYLENOL) 500 MG tablet Take 1 tablet (500 mg total) by mouth every 6 (six) hours as needed. 05/11/20   Hughie Closs, PA-C  azelastine (OPTIVAR) 0.05 % ophthalmic solution Place 1 drop into both eyes 2 (two) times daily.    [provider]  bimatoprost (LUMIGAN) 0.03 % ophthalmic solution Place 1 drop into both eyes at bedtime.    [provider]  brimonidine-timolol (COMBIGAN) 0.2-0.5 % ophthalmic solution Place 1 drop into both eyes every 12 (twelve) hours.    [provider]  calcium citrate-vitamin D (CELEBRATE CALCIUM CITRATE) 500-500 MG-UNIT chewable tablet Chew 1 tablet by mouth every evening.    [provider]  CEQUA 0.09 % SOLN Place 1 drop into both eyes 2 (two) times daily. 11/17/19  [provider]  citalopram (CELEXA) 40 MG tablet Take 40 mg by mouth daily.    [provider]  cyclobenzaprine (FLEXERIL) 10 MG tablet Take 10 mg by mouth 3 (three) times daily. 11/12/21   [provider]  diclofenac Sodium (VOLTAREN) 1 % GEL Apply 4 g  topically 4 (four) times daily. 08/02/20   Hazel Sams, PA-C  Elastic Bandages & Supports (WRIST SPLINT/COCK-UP/LEFT L) MISC Wear nightly and with exacerbating activities. 10/24/19   Patriciaann Clan, DO  Elastic Bandages & Supports (WRIST SPLINT/COCK-UP/RIGHT L) MISC Wear nightly and with exacerbating activities. 10/24/19   Patriciaann Clan, DO  enoxaparin (LOVENOX) 40 MG/0.4ML injection Inject 0.4 mLs (40 mg total) into the skin daily for 7 days. Recommend starting 24 hours after surgery. 03/12/22 03/19/22  Scheeler, Carola Rhine, PA-C  ergocalciferol (VITAMIN D2) 1.25 MG (50000 UT) capsule Take 50,000 Units by mouth once a week.    [provider]  ferrous sulfate 324 MG TBEC Take 324 mg by mouth daily with breakfast.    [provider]  lidocaine (LIDODERM) 5 % Place 1 patch onto the skin daily. Remove & Discard patch within 12 hours or as directed by MD 07/17/21   Raspet, Derry Skill, PA-C  Lurasidone HCl (LATUDA) 60 MG TABS Take 60 mg by mouth at bedtime.    [provider]  Multiple Vitamins-Minerals (CELEBRATE MULTI-COMPLETE 7) CHEW Chew 1 each by mouth daily.    [provider]  ondansetron (ZOFRAN) 4 MG tablet Take 1 tablet (4 mg total) by mouth every 8 (eight) hours as needed for nausea or vomiting. 03/07/22   Scheeler, Carola Rhine, PA-C  orphenadrine (NORFLEX) 100 MG tablet Take 1 tablet (100 mg total) by mouth 2 (two) times daily. 05/02[birth year]63   Delora Fuel, MD  oxyCODONE-acetaminophen (PERCOCET) 10-325 MG tablet Take 1 tablet by mouth twice a day as needed. 08/05/21     pravastatin (PRAVACHOL) 20 MG tablet Take 1 tablet (20 mg total) by mouth daily. 01/10/22   [name], NP-C   Current Facility-Administered Medications  Medication Dose Route Frequency Provider Last Rate Last Admin   acetaminophen (TYLENOL) tablet 650 mg  650 mg Oral Q6H PRN Opyd, Ilene Qua, MD       Or   acetaminophen (TYLENOL) suppository 650 mg  650 mg Rectal Q6H PRN Opyd, Ilene Qua, MD        bisacodyl (DULCOLAX) EC tablet 5 mg  5 mg Oral Daily PRN Regalado, Belkys A, MD       brimonidine (ALPHAGAN) 0.2 % ophthalmic solution 1 drop  1 drop Both Eyes Q12H Opyd, Ilene Qua, MD   1 drop at 03/23/22 0926   citalopram (CELEXA) tablet 40 mg  40 mg Oral Daily Opyd, Ilene Qua, MD   40 mg at 03/23/22 7846   cyclobenzaprine (FLEXERIL) tablet 10 mg  10 mg Oral TID Vianne Bulls, MD   10 mg at 03/23/22 9629   cycloSPORINE (RESTASIS) 0.05 % ophthalmic emulsion 1 drop  1 drop Both Eyes BID Opyd, Ilene Qua, MD   1 drop at 03/23/22 0927   ketotifen (ZADITOR) 0.035 % ophthalmic solution 1 drop  1 drop Both Eyes BID Opyd, Ilene Qua, MD   1 drop at 03/23/22 0925   latanoprost (XALATAN) 0.005 % ophthalmic solution 1 drop  1 drop Both Eyes QHS Opyd, Ilene Qua, MD   1 drop at 03/23/22 0128   lurasidone (LATUDA) tablet 60 mg  60 mg Oral QHS  Vianne Bulls, MD   60 mg at 03/23/22 0128   ondansetron (ZOFRAN) tablet 4 mg  4 mg Oral Q6H PRN Opyd, Ilene Qua, MD       Or   ondansetron (ZOFRAN) injection 4 mg  4 mg Intravenous Q6H PRN Opyd, Ilene Qua, MD       oxyCODONE-acetaminophen (PERCOCET/ROXICET) 5-325 MG per tablet 1 tablet  1 tablet Oral Q8H PRN Opyd, Ilene Qua, MD       And   oxyCODONE (Oxy IR/ROXICODONE) immediate release tablet 5 mg  5 mg Oral Q8H PRN Opyd, Ilene Qua, MD       polyethylene glycol (MIRALAX / GLYCOLAX) packet 17 g  17 g Oral BID Regalado, Belkys A, MD   17 g at 03/23/22 1014   pravastatin (PRAVACHOL) tablet 20 mg  20 mg Oral Daily Opyd, Ilene Qua, MD   20 mg at 03/23/22 0973   senna-docusate (Senokot-S) tablet 1 tablet  1 tablet Oral BID Regalado, Belkys A, MD   1 tablet at 03/23/22 1014   sodium chloride flush (NS) 0.9 % injection 3 mL  3 mL Intravenous Q12H Opyd, Ilene Qua, MD   3 mL at 03/23/22 0927   timolol (TIMOPTIC) 0.5 % ophthalmic solution 1 drop  1 drop Both Eyes BID Opyd, Ilene Qua, MD   1 drop at 03/23/22 5329   Current Outpatient Medications  Medication Sig Dispense  Refill   acetaminophen (TYLENOL) 500 MG tablet Take 1 tablet (500 mg total) by mouth every 6 (six) hours as needed. 30 tablet 0   azelastine (OPTIVAR) 0.05 % ophthalmic solution Place 1 drop into both eyes 2 (two) times daily.     bimatoprost (LUMIGAN) 0.03 % ophthalmic solution Place 1 drop into both eyes at bedtime.     brimonidine-timolol (COMBIGAN) 0.2-0.5 % ophthalmic solution Place 1 drop into both eyes every 12 (twelve) hours.     calcium citrate-vitamin D (CELEBRATE CALCIUM CITRATE) 500-500 MG-UNIT chewable tablet Chew 1 tablet by mouth every evening.     CEQUA 0.09 % SOLN Place 1 drop into both eyes 2 (two) times daily.     citalopram (CELEXA) 40 MG tablet Take 40 mg by mouth daily.     cyclobenzaprine (FLEXERIL) 10 MG tablet Take 10 mg by mouth 3 (three) times daily.     diclofenac Sodium (VOLTAREN) 1 % GEL Apply 4 g topically 4 (four) times daily. 100 g 1   Elastic Bandages & Supports (WRIST SPLINT/COCK-UP/LEFT L) MISC Wear nightly and with exacerbating activities. 1 each 0   Elastic Bandages & Supports (WRIST SPLINT/COCK-UP/RIGHT L) MISC Wear nightly and with exacerbating activities. 1 each 0   enoxaparin (LOVENOX) 40 MG/0.4ML injection Inject 0.4 mLs (40 mg total) into the skin daily for 7 days. Recommend starting 24 hours after surgery. 2.8 mL 0   ergocalciferol (VITAMIN D2) 1.25 MG (50000 UT) capsule Take 50,000 Units by mouth once a week.     ferrous sulfate 324 MG TBEC Take 324 mg by mouth daily with breakfast.     lidocaine (LIDODERM) 5 % Place 1 patch onto the skin daily. Remove & Discard patch within 12 hours or as directed by MD 30 patch 0   Lurasidone HCl (LATUDA) 60 MG TABS Take 60 mg by mouth at bedtime.     Multiple Vitamins-Minerals (CELEBRATE MULTI-COMPLETE 45) CHEW Chew 1 each by mouth daily.     ondansetron (ZOFRAN) 4 MG tablet Take 1 tablet (4 mg total) by mouth every 8 (eight)  hours as needed for nausea or vomiting. 20 tablet 0   orphenadrine (NORFLEX) 100 MG tablet  Take 1 tablet (100 mg total) by mouth 2 (two) times daily. 30 tablet 0   oxyCODONE-acetaminophen (PERCOCET) 10-325 MG tablet Take 1 tablet by mouth twice a day as needed. 60 tablet 0   pravastatin (PRAVACHOL) 20 MG tablet Take 1 tablet (20 mg total) by mouth daily. 90 tablet 1   Allergies as of 03/22/2022   (No Known Allergies)   Family History  Problem Relation Age of Onset   Hypertension Mother    Diabetes Mother    Breast cancer Mother    Diabetes Father    Hypertension Father    Social History   Socioeconomic History   Marital status: Single    Spouse name: Not on file   Number of children: Not on file   Years of education: Not on file   Highest education level: Not on file  Occupational History   Not on file  Tobacco Use   Smoking status: Never   Smokeless tobacco: Never  Vaping Use   Vaping Use: Never used  Substance and Sexual Activity   Alcohol use: No   Drug use: No   Sexual activity: Yes    Partners: Male    Birth control/protection: None  Other Topics Concern   Not on file  Social History Narrative   Not on file   Social Determinants of Health   Financial Resource Strain: Not on file  Food Insecurity: Not on file  Transportation Needs: Not on file  Physical Activity: Not on file  Stress: Not on file  Social Connections: Not on file  Intimate Partner Violence: Not on file   Review of Systems:  Review of Systems  Respiratory:  Negative for shortness of breath.   Cardiovascular:  Negative for chest pain.  Gastrointestinal:  Positive for constipation. Negative for abdominal pain, blood in stool, melena, nausea and vomiting.    OBJECTIVE:   Temp:  [97.9 F (36.6 C)-98.8 F (37.1 C)] 98.6 F (37 C) (01/28 1017) Pulse Rate:  [34-132] 82 (01/28 1015) Resp:  [14-24] 18 (01/28 1015) BP: (79-147)/(47-99) 106/96 (01/28 1015) SpO2:  [17 %-100 %] 99 % (01/28 1015) Weight:  [117.5 kg] 117.5 kg (01/27 1635) Last BM Date : 03/20/22 Physical  Exam Constitutional:      General: She is not in acute distress.    Appearance: She is not ill-appearing, toxic-appearing or diaphoretic.  Cardiovascular:     Rate and Rhythm: Normal rate and regular rhythm.  Pulmonary:     Effort: No respiratory distress.     Breath sounds: Normal breath sounds.  Abdominal:     General: There is no distension.     Palpations: Abdomen is soft.     Tenderness: There is no abdominal tenderness.  Genitourinary:    Comments: Rectal examination completed with nursing chaperone at bedside.  Small nonthrombosed external hemorrhoids.  Good rectal tone.  Solid fecal matter noted on digital rectal exam, unable to complete full disimpaction, patient somewhat uncomfortable, stool appeared light brown in color on examining finger, no melena or hematochezia.  Not sent for fecal occult testing as already ordered and pending. Skin:    General: Skin is warm and dry.  Neurological:     Mental Status: She is alert.     Labs: Recent Labs    03/22/22 1708 03/23/22 0851  WBC 10.7* 10.8*  HGB 7.1* 8.3*  HCT 22.4* 25.3*  PLT 279  282   BMET Recent Labs    03/22/22 1708 03/23/22 0500  NA 137 138  K 5.0 4.3  CL 103 105  CO2 27 25  GLUCOSE 108* 109*  BUN 20 13  CREATININE 0.93 0.82  CALCIUM 8.9 8.7*   LFT Recent Labs    03/22/22 1708  PROT 6.3*  ALBUMIN 2.9*  AST 29  ALT 13  ALKPHOS 59  BILITOT 1.0   PT/INR No results for input(s): "LABPROT", "INR" in the last 72 hours.  Diagnostic imaging: No results found.  IMPRESSION: Normocytic anemia, improved with no blood product administration Postop day 4 status post panniculectomy Increased bloody output per JP drain  -Started Lovenox after procedure Postoperative constipation Leukocytosis  PLAN: -Suspect anemia contributed to from increased bloody output per JP drain, your plastic surgery recommendations -Rectal examination encouraging today, no signs of active GI bleeding -Would continue bowel  regimen with MiraLAX 17 g p.o. twice daily -Recommend to add Dulcolax suppository daily -Continue bowel regimen while on narcotic pain medications -If fecal occult testing is positive, recommend outpatient follow-up with gastroenterology to discuss outpatient endoscopy -No plan for inpatient endoscopy -Gastroenterology team will respectfully sign off and be available for any questions that arise   LOS: 0 days   Danton Clap, [facility] Gastroenterology

## 2022-03-24 DIAGNOSIS — D72829 Elevated white blood cell count, unspecified: Secondary | ICD-10-CM | POA: Diagnosis present

## 2022-03-24 DIAGNOSIS — D649 Anemia, unspecified: Secondary | ICD-10-CM | POA: Diagnosis not present

## 2022-03-24 DIAGNOSIS — D62 Acute posthemorrhagic anemia: Secondary | ICD-10-CM | POA: Diagnosis present

## 2022-03-24 DIAGNOSIS — Z79899 Other long term (current) drug therapy: Secondary | ICD-10-CM | POA: Diagnosis not present

## 2022-03-24 DIAGNOSIS — Z803 Family history of malignant neoplasm of breast: Secondary | ICD-10-CM | POA: Diagnosis not present

## 2022-03-24 DIAGNOSIS — F418 Other specified anxiety disorders: Secondary | ICD-10-CM | POA: Diagnosis present

## 2022-03-24 DIAGNOSIS — F419 Anxiety disorder, unspecified: Secondary | ICD-10-CM | POA: Diagnosis present

## 2022-03-24 DIAGNOSIS — I10 Essential (primary) hypertension: Secondary | ICD-10-CM | POA: Diagnosis present

## 2022-03-24 DIAGNOSIS — Z8249 Family history of ischemic heart disease and other diseases of the circulatory system: Secondary | ICD-10-CM | POA: Diagnosis not present

## 2022-03-24 DIAGNOSIS — E785 Hyperlipidemia, unspecified: Secondary | ICD-10-CM | POA: Diagnosis present

## 2022-03-24 DIAGNOSIS — K5909 Other constipation: Secondary | ICD-10-CM | POA: Diagnosis present

## 2022-03-24 DIAGNOSIS — G894 Chronic pain syndrome: Secondary | ICD-10-CM | POA: Diagnosis present

## 2022-03-24 DIAGNOSIS — K644 Residual hemorrhoidal skin tags: Secondary | ICD-10-CM | POA: Diagnosis present

## 2022-03-24 DIAGNOSIS — F32A Depression, unspecified: Secondary | ICD-10-CM | POA: Diagnosis present

## 2022-03-24 DIAGNOSIS — Z6841 Body Mass Index (BMI) 40.0 and over, adult: Secondary | ICD-10-CM | POA: Diagnosis not present

## 2022-03-24 DIAGNOSIS — Z833 Family history of diabetes mellitus: Secondary | ICD-10-CM | POA: Diagnosis not present

## 2022-03-24 DIAGNOSIS — T45515A Adverse effect of anticoagulants, initial encounter: Secondary | ICD-10-CM | POA: Diagnosis present

## 2022-03-24 LAB — BASIC METABOLIC PANEL
Anion gap: 7 (ref 5–15)
BUN: 6 mg/dL (ref 6–20)
CO2: 28 mmol/L (ref 22–32)
Calcium: 9.1 mg/dL (ref 8.9–10.3)
Chloride: 101 mmol/L (ref 98–111)
Creatinine, Ser: 0.74 mg/dL (ref 0.44–1.00)
GFR, Estimated: >60 mL/min (ref >60)
Glucose, Bld: 90 mg/dL (ref 70–99)
Potassium: 4 mmol/L (ref 3.5–5.1)
Sodium: 136 mmol/L (ref 135–145)

## 2022-03-24 LAB — CBC
HCT: 22.5 % — ABNORMAL LOW (ref 36.0–46.0)
HCT: 23.5 % — ABNORMAL LOW (ref 36.0–46.0)
Hemoglobin: 7.1 g/dL — ABNORMAL LOW (ref 12.0–15.0)
Hemoglobin: 7.8 g/dL — ABNORMAL LOW (ref 12.0–15.0)
MCH: 29.6 pg (ref 26.0–34.0)
MCH: 31 pg (ref 26.0–34.0)
MCHC: 31.6 g/dL (ref 30.0–36.0)
MCHC: 33.2 g/dL (ref 30.0–36.0)
MCV: 93.8 fL (ref 80.0–100.0)
MCV: 93.3 fL (ref 80.0–100.0)
Platelets: 322 10*3/uL (ref 150–400)
Platelets: 343 10*3/uL (ref 150–400)
RBC: 2.4 MIL/uL — ABNORMAL LOW (ref 3.87–5.11)
RBC: 2.52 MIL/uL — ABNORMAL LOW (ref 3.87–5.11)
RDW: 13.8 % (ref 11.5–15.5)
RDW: 14 % (ref 11.5–15.5)
WBC: 7.9 10*3/uL (ref 4.0–10.5)
WBC: 9 10*3/uL (ref 4.0–10.5)
nRBC: 0 % (ref 0.0–0.2)
nRBC: 0 % (ref 0.0–0.2)

## 2022-03-24 LAB — RETICULOCYTES
Immature Retic Fract: 35.2 % — ABNORMAL HIGH (ref 2.3–15.9)
RBC.: 2.5 MIL/uL — ABNORMAL LOW (ref 3.87–5.11)
Retic Count, Absolute: 93.5 10*3/uL (ref 19.0–186.0)
Retic Ct Pct: 3.7 % — ABNORMAL HIGH (ref 0.4–3.1)

## 2022-03-24 LAB — PREPARE RBC (CROSSMATCH)

## 2022-03-24 LAB — FERRITIN: Ferritin: 183 ng/mL (ref 11–307)

## 2022-03-24 LAB — FOLATE: Folate: >40 ng/mL (ref >5.9)

## 2022-03-24 LAB — IRON AND TIBC
Iron: 49 ug/dL (ref 28–170)
Saturation Ratios: 16 % (ref 10.4–31.8)
TIBC: 301 ug/dL (ref 250–450)
UIBC: 252 ug/dL

## 2022-03-24 LAB — PROTIME-INR
INR: 1.1 (ref 0.8–1.2)
Prothrombin Time: 14.1 seconds (ref 11.4–15.2)

## 2022-03-24 LAB — VITAMIN B12: Vitamin B-12: 1003 pg/mL — ABNORMAL HIGH (ref 180–914)

## 2022-03-24 MED ORDER — SODIUM CHLORIDE 0.9% IV SOLUTION: Freq: Once | INTRAVENOUS | Status: AC

## 2022-03-24 MED ORDER — TRANEXAMIC ACID-NACL 1000-0.7 MG/100ML-% IV SOLN: 1000.0000 mg | Freq: Once | INTRAVENOUS | Status: DC

## 2022-03-24 NOTE — Progress Notes (Signed)
PROGRESS NOTE    Carla Sloan  DXI:[id number] DOB: [birth year] DOA: 03/22/2022 PCP: [name], NP-C   Brief Narrative: 62 year old with past medical history significant for hypertension, depression, anxiety, BMI 43, status post panniculectomy on 03/19/2022 presents to the emergency department with constipation and tachycardia.  Patient underwent panniculectomy on 03/19/2022, had increased bloody output from her surgical drain after starting prophylactic Lovenox.  She was instructed to hold Lovenox on 03/21/2022 but has continued to experience bloody drainage.  She has not been able to had a bowel movement since the surgery.  On EMS evaluation her heart rate was 150.  On arrival to the ED she was found to have tachycardia heart rate 133, hemoglobin 7.1 down from 04 Jul 2021.  Fecal occult blood test was positive. Gastroenterologist and plastic surgery were consulted by ED physician.   Assessment & Plan:   Principal Problem:   Symptomatic anemia Active Problems:   Occult GI bleeding   Depression with anxiety   Postoperative anemia due to acute blood loss   1-Symptomatic anemia, Post Operative Bleeding,  Occult GI bleed: -Patient presented with a hemoglobin of 7 down from 04 Jul 2021. -FOBT positive.  Normal stool. -She has had significant bloody output from her surgical drain and this likely accounts for the majority of her blood clots. -Plastic surgery and GI consulted. -received one unit PRBC 1/28. -Monitor hemoglobin. Hb down to 7. Repeat this afternoon, if still low will proceed with blood transfusion.  IV fluids.  Plastic Sx considering TXA tomorrow if bleeding continue. They will discuss with patient risk and benefit.  Anemia panel. Normal.   Depression, anxiety Continue with Latuda and Celexa   Chronic pain syndrome: Continue with Percocet and Flexeril   History of hypertension Resolved with weight loss, no longer on antihypertensive.  Constipation;  Continue with Miralax. Had BM     Estimated body mass index is 43.1 kg/m as calculated from the following:   Height as of this encounter: '5\' 5"'$  (1.651 m).   Weight as of this encounter: 117.5 kg.   DVT prophylaxis: SCD Code Status: Full code Family Communication: Care discussed with patient Disposition Plan:  Status is: Observation The patient remains OBS appropriate and will d/c before 2 midnights.    Consultants:  Plastic Sx GI  Procedures:    Antimicrobials:    Subjective: She would like to go home, but she understand she needs to stay to monitor her hb.  She had BM yesterday.   Objective: Vitals:   03/24/22 0820 03/24/22 0900 03/24/22 1237 03/24/22 1346  BP: (!) 112/59 (!) 119/56 (!) 110/59 116/64  Pulse: 88 84 79 79  Resp: '18  14 15  '$ Temp: 98.3 F (36.8 C)  98.3 F (36.8 C) 98 F (36.7 C)  TempSrc: Oral  Oral Oral  SpO2: 99% 100% 99% 100%  Weight:      Height:        Intake/Output Summary (Last 24 hours) at 03/24/2022 1519 Last data filed at 03/24/2022 1044 Gross per 24 hour  Intake 3 ml  Output 330 ml  Net -327 ml    Filed Weights   03/22/22 1635  Weight: 117.5 kg    Examination:  General exam: NAD Respiratory system: CTA Cardiovascular system: S 1, S 2 RRR Gastrointestinal system: Abdomen is soft, mild tender, JP in  place.   Data Reviewed: I have personally reviewed following labs and imaging studies  CBC: Recent Labs  Lab 03/22/22 1708 03/23/22 0851 03/24/22 0518  WBC 10.7* 10.8* 7.9  NEUTROABS 6.4  --   --   HGB 7.1* 8.3* 7.1*  HCT 22.4* 25.3* 22.5*  MCV 94.1 92.3 93.8  PLT 279 282 751    Basic Metabolic Panel: Recent Labs  Lab 03/22/22 1708 03/23/22 0500 03/24/22 0518  NA 137 138 136  K 5.0 4.3 4.0  CL 103 105 101  CO2 '27 25 28  '$ GLUCOSE 108* 109* 90  BUN '20 13 6  '$ CREATININE 0.93 0.82 0.74  CALCIUM 8.9 8.7* 9.1  MG  --  2.0  --     GFR: Estimated Creatinine Clearance: 95.9 mL/min (by C-G formula based on  SCr of 0.74 mg/dL). Liver Function Tests: Recent Labs  Lab 03/22/22 1708  AST 29  ALT 13  ALKPHOS 59  BILITOT 1.0  PROT 6.3*  ALBUMIN 2.9*    No results for input(s): "LIPASE", "AMYLASE" in the last 168 hours. No results for input(s): "AMMONIA" in the last 168 hours. Coagulation Profile: Recent Labs  Lab 03/24/22 0813  INR 1.1   Cardiac Enzymes: No results for input(s): "CKTOTAL", "CKMB", "CKMBINDEX", "TROPONINI" in the last 168 hours. BNP (last 3 results) No results for input(s): "PROBNP" in the last 8760 hours. HbA1C: No results for input(s): "HGBA1C" in the last 72 hours. CBG: No results for input(s): "GLUCAP" in the last 168 hours. Lipid Profile: No results for input(s): "CHOL", "HDL", "LDLCALC", "TRIG", "CHOLHDL", "LDLDIRECT" in the last 72 hours. Thyroid Function Tests: No results for input(s): "TSH", "T4TOTAL", "FREET4", "T3FREE", "THYROIDAB" in the last 72 hours. Anemia Panel: Recent Labs    03/24/22 0808  VITAMINB12 1,003*  FOLATE >40.0  FERRITIN 183  TIBC 301  IRON 49  RETICCTPCT 3.7*   Sepsis Labs: No results for input(s): "PROCALCITON", "LATICACIDVEN" in the last 168 hours.  No results found for this or any previous visit (from the past 240 hour(s)).       Radiology Studies: No results found.      Scheduled Meds:  bisacodyl  10 mg Rectal Once   brimonidine  1 drop Both Eyes Q12H   citalopram  40 mg Oral Daily   cyclobenzaprine  10 mg Oral TID   cycloSPORINE  1 drop Both Eyes BID   ketotifen  1 drop Both Eyes BID   latanoprost  1 drop Both Eyes QHS   lurasidone  60 mg Oral QHS   polyethylene glycol  17 g Oral BID   pravastatin  20 mg Oral Daily   senna-docusate  1 tablet Oral BID   sodium chloride flush  3 mL Intravenous Q12H   timolol  1 drop Both Eyes BID   Continuous Infusions:  lactated ringers 75 mL/hr at 03/24/22 0530     LOS: 0 days    Time spent: 35 minutes     Jacody Beneke A Razi Hickle, MD [facility]   If  7PM-7AM, please contact night-coverage [email or web address]  03/24/2022, 3:19 PM

## 2022-03-24 NOTE — Progress Notes (Signed)
  Subjective:  POD # 5 SP panniculectomy by Dr. Marla Roe on 03/19/2021 Presented with complaints of constipation, noted to have low hemoglobin during workup  Pt notes she is doing well this morning, reports that she has had a bowel movement Approximately 180 cc from her JP since last night  Objective: Vital signs in last 24 hours: Temp:  [97 F (36.1 C)-98.6 F (37 C)] 97.8 F (36.6 C) (01/29 0500) Pulse Rate:  [69-109] 87 (01/29 0500) Resp:  [16-21] 18 (01/29 0500) BP: (80-126)/(55-96) 107/58 (01/29 0500) SpO2:  [95 %-100 %] 100 % (01/29 0500) Last BM Date : 03/20/22   Physical Exam:  General: Pt resting comfortably in no acute distress Abdomin soft with palpable fluid collections, no areas of firmness JP drain with dark bloody output, no draining around the  drain site   Assessment/Plan:  POD # 5 SP panniculectomy Pt is feeling well this morning, no specific complaints She received 1 unit of PRBC on Friday, hemoglobin up to 8.3 yesterday, back down to 7.1 this morning Dr. Marla Roe had the opportunity to evaluate the patient this morning, she would recommend TXA today as long as no contraindications. I will reach out to the hospitalist to discuss this further.     Carla Kern Mico Spark, PA-C 03/24/2022

## 2022-03-24 NOTE — ED Notes (Signed)
ED TO INPATIENT HANDOFF REPORT  ED Nurse Name and Phone #:  Nicki Reaper [phone]  S Name/Age/Gender Carla Sloan 62 y.o. female Room/Bed: 042C/042C  Code Status   Code Status: Full Code  Home/SNF/Other Home Patient oriented to: self, place, time, and situation Is this baseline? Yes   Triage Complete: Triage complete  Chief Complaint Symptomatic anemia [D64.9]  Triage Note Pt BIB EMS for constipation x3 days following recent ABD surgery, EMS found pt to have HR in 150's pt denies CP  and SHOB    Allergies No Known Allergies  Level of Care/Admitting Diagnosis ED Disposition     ED Disposition  Admit   Condition  --   [place]: [place] [100100]  Level of Care: Telemetry Surgical [105]  May place patient in observation at [facility] or [place] if equivalent level of care is available:: No  Covid Evaluation: Asymptomatic - no recent exposure (last 10 days) testing not required  Diagnosis: Symptomatic anemia [0086761]  Admitting Physician: Vianne Bulls [[id number]]  Attending Physician: Vianne Bulls [[id number]]          B Medical/Surgery History Past Medical History:  Diagnosis Date   Arthritis    Depression    Hypertension    Muscle spasms of lower extremity    and back   Past Surgical History:  Procedure Laterality Date   ABDOMINOPLASTY/PANNICULECTOMY WITH LIPOSUCTION N/A 03/19/2022   Procedure: PANNICULECTOMY WITH LIPOSUCTION;  Surgeon: Wallace Going, DO;  Location: [place];  Service: Plastics;  Laterality: N/A;   COLONOSCOPY WITH PROPOFOL N/A 09/24/2012   Procedure: COLONOSCOPY WITH PROPOFOL;  Surgeon: Jeryl Columbia, MD;  Location: [facility] ENDOSCOPY;  Service: Endoscopy;  Laterality: N/A;   COLONOSCOPY WITH PROPOFOL N/A 05/19/2016   Procedure: COLONOSCOPY WITH PROPOFOL;  Surgeon: Clarene Essex, MD;  Location[facility] ENDOSCOPY;  Service: Endoscopy;  Laterality: N/A;   LAPAROSCOPIC GASTRIC SLEEVE  RESECTION N/A 04/24/2020   Procedure: LAPAROSCOPIC GASTRIC SLEEVE RESECTION;  Surgeon: Clovis Riley, MD;  Location: [facility] ORS;  Service: General;  Laterality: N/A;   NO PAST SURGERIES     TUBAL LIGATION     UPPER GI ENDOSCOPY N/A 04/24/2020   Procedure: UPPER GI ENDOSCOPY;  Surgeon: Clovis Riley, MD;  Location: [facility];  Service: General;  Laterality: N/A;     A IV Location/Drains/Wounds Patient Lines/Drains/Airways Status     Active Line/Drains/Airways     Name Placement date Placement time Site Days   Peripheral IV 03/22/22 22 G 1" Right Hand 03/22/22  1916  Hand  2   Closed System Drain 1 Anterior Abdomen Bulb (JP) 19 Fr. 03/19/22  0855  Abdomen  5   External Urinary Catheter 03/22/22  1717  --  2   Incision - 6 Ports Abdomen Right;Lateral Medial Left;Umbilicus Mid;Upper Left Left;Lower 04/24/20  0937  -- 699            Intake/Output Last 24 hours  Intake/Output Summary (Last 24 hours) at 03/24/2022 1146 Last data filed at 03/24/2022 1044 Gross per 24 hour  Intake 3 ml  Output 330 ml  Net -327 ml    Labs/Imaging Results for orders placed or performed during the hospital encounter of 03/22/22 (from the past 48 hour(s))  CBC with Differential     Status: Abnormal   Collection Time: 03/22/22  5:08 PM  Result Value Ref Range   WBC 10.7 (H) 4.0 - 10.5 K/uL   RBC 2.38 (L) 3.87 -  5.11 MIL/uL   Hemoglobin 7.1 (L) 12.0 - 15.0 g/dL   HCT 22.4 (L) 36.0 - 46.0 %   MCV 94.1 80.0 - 100.0 fL   MCH 29.8 26.0 - 34.0 pg   MCHC 31.7 30.0 - 36.0 g/dL   RDW 13.6 11.5 - 15.5 %   Platelets 279 150 - 400 K/uL   nRBC 0.0 0.0 - 0.2 %   Neutrophils Relative % 60 %   Neutro Abs 6.4 1.7 - 7.7 K/uL   Lymphocytes Relative 32 %   Lymphs Abs 3.4 0.7 - 4.0 K/uL   Monocytes Relative 8 %   Monocytes Absolute 0.8 0.1 - 1.0 K/uL   Eosinophils Relative 0 %   Eosinophils Absolute 0.0 0.0 - 0.5 K/uL   Basophils Relative 0 %   Basophils Absolute 0.0 0.0 - 0.1 K/uL   Immature Granulocytes 0 %    Abs Immature Granulocytes 0.04 0.00 - 0.07 K/uL    Comment: Performed at [facility] [address] [place], [place] [address]  Comprehensive metabolic panel     Status: Abnormal   Collection Time: 03/22/22  5:08 PM  Result Value Ref Range   Sodium 137 135 - 145 mmol/L   Potassium 5.0 3.5 - 5.1 mmol/L    Comment: HEMOLYSIS AT THIS LEVEL MAY AFFECT RESULT   Chloride 103 98 - 111 mmol/L   CO2 27 22 - 32 mmol/L   Glucose, Bld 108 (H) 70 - 99 mg/dL    Comment: Glucose reference range applies only to samples taken after fasting for at least 8 hours.   BUN 20 6 - 20 mg/dL   Creatinine, Ser 0.93 0.44 - 1.00 mg/dL   Calcium 8.9 8.9 - 10.3 mg/dL   Total Protein 6.3 (L) 6.5 - 8.1 g/dL   Albumin 2.9 (L) 3.5 - 5.0 g/dL   AST 29 15 - 41 U/L    Comment: HEMOLYSIS AT THIS LEVEL MAY AFFECT RESULT   ALT 13 0 - 44 U/L    Comment: HEMOLYSIS AT THIS LEVEL MAY AFFECT RESULT   Alkaline Phosphatase 59 38 - 126 U/L   Total Bilirubin 1.0 0.3 - 1.2 mg/dL    Comment: HEMOLYSIS AT THIS LEVEL MAY AFFECT RESULT   GFR, Estimated >60 >60 mL/min    Comment: (NOTE) Calculated using the CKD-EPI Creatinine Equation (2021)    Anion gap 7 5 - 15    Comment: Performed at [facility], [place] [address], [place], [place] [address]  Type and screen [place]     Status: None   Collection Time: 03/22/22 10:00 PM  Result Value Ref Range   ABO/RH(D) O POS    Antibody Screen NEG    Sample Expiration 03/25/2022,2359    Unit Number [id number]    Blood Component Type RED CELLS,LR    Unit division 00    Status of Unit ISSUED,FINAL    Transfusion Status OK TO TRANSFUSE    Crossmatch Result      Compatible Performed at [facility], [place] [address], [place], [place] [address]   Prepare RBC (crossmatch)     Status: None   Collection Time: 03/22/22 11:41 PM  Result Value Ref Range   Order Confirmation      ORDER PROCESSED BY BLOOD BANK Performed at [facility], [place]  [address] [place], [place] [address]   Basic metabolic panel     Status: Abnormal   Collection Time: 03/23/22  5:00  AM  Result Value Ref Range   Sodium 138 135 - 145 mmol/L   Potassium 4.3 3.5 - 5.1 mmol/L   Chloride 105 98 - 111 mmol/L   CO2 25 22 - 32 mmol/L   Glucose, Bld 109 (H) 70 - 99 mg/dL    Comment: Glucose reference range applies only to samples taken after fasting for at least 8 hours.   BUN 13 6 - 20 mg/dL   Creatinine, Ser 0.82 0.44 - 1.00 mg/dL   Calcium 8.7 (L) 8.9 - 10.3 mg/dL   GFR, Estimated >60 >60 mL/min    Comment: (NOTE) Calculated using the CKD-EPI Creatinine Equation (2021)    Anion gap 8 5 - 15    Comment: Performed at [facility] [address], [place], [place] [address]  Magnesium     Status: None   Collection Time: 03/23/22  5:00 AM  Result Value Ref Range   Magnesium 2.0 1.7 - 2.4 mg/dL    Comment: Performed at [facility] [address], [place], [place] [address]  CBC     Status: Abnormal   Collection Time: 03/23/22  8:51 AM  Result Value Ref Range   WBC 10.8 (H) 4.0 - 10.5 K/uL   RBC 2.74 (L) 3.87 - 5.11 MIL/uL   Hemoglobin 8.3 (L) 12.0 - 15.0 g/dL   HCT 25.3 (L) 36.0 - 46.0 %   MCV 92.3 80.0 - 100.0 fL   MCH 30.3 26.0 - 34.0 pg   MCHC 32.8 30.0 - 36.0 g/dL   RDW 13.9 11.5 - 15.5 %   Platelets 282 150 - 400 K/uL   nRBC 0.0 0.0 - 0.2 %    Comment: Performed at [facility], [place] [address] [place], [place] [address]  Basic metabolic panel     Status: None   Collection Time: 03/24/22  5:18 AM  Result Value Ref Range   Sodium 136 135 - 145 mmol/L   Potassium 4.0 3.5 - 5.1 mmol/L   Chloride 101 98 - 111 mmol/L   CO2 28 22 - 32 mmol/L   Glucose, Bld 90 70 - 99 mg/dL    Comment: Glucose reference range applies only to samples taken after fasting for at least 8 hours.   BUN 6 6 - 20 mg/dL   Creatinine, Ser 0.74 0.44 - 1.00 mg/dL   Calcium 9.1 8.9 - 10.3 mg/dL   GFR, Estimated >60 >60 mL/min    Comment: (NOTE) Calculated  using the CKD-EPI Creatinine Equation (2021)    Anion gap 7 5 - 15    Comment: Performed at [facility] [address], [place], [place] [address]  CBC     Status: Abnormal   Collection Time: 03/24/22  5:18 AM  Result Value Ref Range   WBC 7.9 4.0 - 10.5 K/uL   RBC 2.40 (L) 3.87 - 5.11 MIL/uL   Hemoglobin 7.1 (L) 12.0 - 15.0 g/dL   HCT 22.5 (L) 36.0 - 46.0 %   MCV 93.8 80.0 - 100.0 fL   MCH 29.6 26.0 - 34.0 pg   MCHC 31.6 30.0 - 36.0 g/dL   RDW 13.8 11.5 - 15.5 %   Platelets 322 150 - 400 K/uL   nRBC 0.0 0.0 - 0.2 %    Comment: Performed at [facility], [place] [address], [place], [place] [address]  Vitamin B12     Status: Abnormal   Collection Time: 03/24/22  8:08 AM  Result  Value Ref Range   Vitamin B-12 1,003 (H) 180 - 914 pg/mL    Comment: (NOTE) This assay is not validated for testing neonatal or myeloproliferative syndrome specimens for Vitamin B12 levels. Performed at [facility], [place] [address], [place], [place] [address]   Folate     Status: None   Collection Time: 03/24/22  8:08 AM  Result Value Ref Range   Folate >40.0 >5.9 ng/mL    Comment: Performed at [facility] [address], [place], [place] [address]  Iron and TIBC     Status: None   Collection Time: 03/24/22  8:08 AM  Result Value Ref Range   Iron 49 28 - 170 ug/dL   TIBC 301 250 - 450 ug/dL   Saturation Ratios 16 10.4 - 31.8 %   UIBC 252 ug/dL    Comment: Performed at [facility], [place] [address], [place], [place] [address]  Ferritin     Status: None   Collection Time: 03/24/22  8:08 AM  Result Value Ref Range   Ferritin 183 11 - 307 ng/mL    Comment: Performed at [facility], [place] [address], [place], [place] [address]  Reticulocytes     Status: Abnormal   Collection Time: 03/24/22  8:08 AM  Result Value Ref Range   Retic Ct Pct 3.7 (H) 0.4 - 3.1 %   RBC. 2.50 (L) 3.87 - 5.11 MIL/uL   Retic Count, Absolute 93.5 19.0 - 186.0 K/uL   Immature Retic Fract  35.2 (H) 2.3 - 15.9 %    Comment: Performed at [facility] [address] [place], [place] [address]  Protime-INR     Status: None   Collection Time: 03/24/22  8:13 AM  Result Value Ref Range   Prothrombin Time 14.1 11.4 - 15.2 seconds   INR 1.1 0.8 - 1.2    Comment: (NOTE) INR goal varies based on device and disease states. Performed at [facility], [place] [address] [place], [place] [address]    No results found.  Pending Labs Unresulted Labs (From admission, onward)     Start     Ordered   03/24/22 1242  CBC  Once,   R        03/24/22 0742   03/23/22 6734  Basic metabolic panel  Daily,   R      03/22/22 2341            Vitals/Pain Today's Vitals   03/23/22 2030 03/24/22 0051 03/24/22 0500 03/24/22 0820  BP: 126/64 104/61 (!) 107/58 (!) 112/59  Pulse: 75 76 87 88  Resp: '18  18 18  '$ Temp: 98.2 F (36.8 C) (!) 97 F (36.1 C) 97.8 F (36.6 C) 98.3 F (36.8 C)  TempSrc: Oral Oral Oral Oral  SpO2: 100% 100% 100% 99%  Weight:      Height:      PainSc: 0-No pain  0-No pain     Isolation Precautions No active isolations  Medications Medications  pravastatin (PRAVACHOL) tablet 20 mg (20 mg Oral Given 03/24/22 0937)  citalopram (CELEXA) tablet 40 mg (40 mg Oral Given 03/24/22 0937)  lurasidone (LATUDA) tablet 60 mg (60 mg Oral Given 03/23/22 2148)  cyclobenzaprine (FLEXERIL) tablet 10 mg (10 mg Oral Given 03/24/22 0937)  ketotifen (ZADITOR) 0.035 % ophthalmic solution 1 drop (1 drop Both Eyes Given 03/24/22 0940)  latanoprost (XALATAN) 0.005 % ophthalmic solution 1 drop (1 drop Both Eyes Given  03/23/22 2146)  cycloSPORINE (RESTASIS) 0.05 % ophthalmic emulsion 1 drop (1 drop Both Eyes Given 03/24/22 0940)  sodium chloride flush (NS) 0.9 % injection 3 mL (3 mLs Intravenous Not Given 03/24/22 0939)  acetaminophen (TYLENOL) tablet 650 mg (has no administration in time range)    Or  acetaminophen (TYLENOL) suppository 650 mg (has no administration in time range)   ondansetron (ZOFRAN) tablet 4 mg (has no administration in time range)    Or  ondansetron (ZOFRAN) injection 4 mg (has no administration in time range)  oxyCODONE-acetaminophen (PERCOCET/ROXICET) 5-325 MG per tablet 1 tablet (has no administration in time range)    And  oxyCODONE (Oxy IR/ROXICODONE) immediate release tablet 5 mg (has no administration in time range)  brimonidine (ALPHAGAN) 0.2 % ophthalmic solution 1 drop (1 drop Both Eyes Given 03/24/22 0938)  timolol (TIMOPTIC) 0.5 % ophthalmic solution 1 drop (1 drop Both Eyes Given 03/24/22 0939)  polyethylene glycol (MIRALAX / GLYCOLAX) packet 17 g (17 g Oral Not Given 03/24/22 0930)  senna-docusate (Senokot-S) tablet 1 tablet (1 tablet Oral Not Given 03/24/22 0930)  bisacodyl (DULCOLAX) EC tablet 5 mg (has no administration in time range)  lactated ringers infusion ( Intravenous New Bag/Given 03/24/22 0530)  bisacodyl (DULCOLAX) suppository 10 mg (10 mg Rectal Not Given 03/23/22 1645)  lactated ringers bolus 1,000 mL (0 mLs Intravenous Stopped 03/22/22 2323)  pantoprazole (PROTONIX) injection 40 mg (40 mg Intravenous Given 03/22/22 2230)  0.9 %  sodium chloride infusion (Manually program via Guardrails IV Fluids) (0 mLs Intravenous Stopped 03/23/22 0602)    Mobility walks     Focused Assessments Cardiac Assessment Handoff:    Lab Results  Component Value Date   CKTOTAL 114 02/27/2009   CKMB 1.5 02/27/2009   TROPONINI 0.03        NO INDICATION OF MYOCARDIAL INJURY. 02/27/2009   Lab Results  Component Value Date   DDIMER 0.32 07/03/2011   Does the Patient currently have chest pain? No    R Recommendations: See Admitting Provider Note  Report given to:   Additional Notes: Aox4, ambulates well, no difficulty with PO meds, check JP drain frequently with serosang fluid,LR at 75,

## 2022-03-24 NOTE — Progress Notes (Signed)
Patient IV leaking. Stop infusing, place iv team consult. Waiting for IV placement, pass on to night nurse that patient blood is ready once she receive new IV.

## 2022-03-25 ENCOUNTER — Encounter: Payer: Medicaid Other | Admitting: Physician Assistant

## 2022-03-25 DIAGNOSIS — D649 Anemia, unspecified: Secondary | ICD-10-CM | POA: Diagnosis not present

## 2022-03-25 LAB — BASIC METABOLIC PANEL
Anion gap: 8 (ref 5–15)
BUN: 7 mg/dL (ref 6–20)
CO2: 29 mmol/L (ref 22–32)
Calcium: 9.3 mg/dL (ref 8.9–10.3)
Chloride: 103 mmol/L (ref 98–111)
Creatinine, Ser: 0.66 mg/dL (ref 0.44–1.00)
GFR, Estimated: >60 mL/min (ref >60)
Glucose, Bld: 108 mg/dL — ABNORMAL HIGH (ref 70–99)
Potassium: 4.3 mmol/L (ref 3.5–5.1)
Sodium: 140 mmol/L (ref 135–145)

## 2022-03-25 LAB — TYPE AND SCREEN
ABO/RH(D): O POS
Antibody Screen: NEGATIVE
Unit division: 0
Unit division: 0

## 2022-03-25 LAB — BPAM RBC
Blood Product Expiration Date: 202402292359
Blood Product Expiration Date: 202403022359
ISSUE DATE / TIME: 202401280107
ISSUE DATE / TIME: 202401292056
Unit Type and Rh: 5100
Unit Type and Rh: 5100

## 2022-03-25 LAB — CBC
HCT: 27.3 % — ABNORMAL LOW (ref 36.0–46.0)
Hemoglobin: 8.6 g/dL — ABNORMAL LOW (ref 12.0–15.0)
MCH: 29.6 pg (ref 26.0–34.0)
MCHC: 31.5 g/dL (ref 30.0–36.0)
MCV: 93.8 fL (ref 80.0–100.0)
Platelets: 378 10*3/uL (ref 150–400)
RBC: 2.91 MIL/uL — ABNORMAL LOW (ref 3.87–5.11)
RDW: 14.3 % (ref 11.5–15.5)
WBC: 8.5 10*3/uL (ref 4.0–10.5)
nRBC: 0 % (ref 0.0–0.2)

## 2022-03-25 MED ORDER — FERROUS SULFATE 325 (65 FE) MG PO TABS
325.0000 mg | ORAL_TABLET | Freq: Every day | ORAL | Status: DC
  Administered 2022-03-25 – 2022-03-28 (×4): 325 mg via ORAL
  Filled 2022-03-25 (×4): qty 1

## 2022-03-25 NOTE — Progress Notes (Signed)
PROGRESS NOTE    Carla Sloan  ZOX:[id number] DOB: [birth year] DOA: 03/22/2022 PCP: [name], NP-C   Brief Narrative: 62 year old with past medical history significant for hypertension, depression, anxiety, BMI 43, status post panniculectomy on 03/19/2022 presents to the emergency department with constipation and tachycardia.  Patient underwent panniculectomy on 03/19/2022, had increased bloody output from her surgical drain after starting prophylactic Lovenox.  She was instructed to hold Lovenox on 03/21/2022 but has continued to experience bloody drainage.  She has not been able to had a bowel movement since the surgery.  On EMS evaluation her heart rate was 150.  On arrival to the ED she was found to have tachycardia heart rate 133, hemoglobin 7.1 down from 04 Jul 2021.  Fecal occult blood test was positive. Gastroenterologist and plastic surgery were consulted by ED physician.  Blood loss anemia in setting recent surgery. Bloody out put from drain surgical site. She received 2 units PRBC so far. Hb today stable at 8.6. plan to continue to monitor.   Assessment & Plan:   Principal Problem:   Symptomatic anemia Active Problems:   Occult GI bleeding   Depression with anxiety   Postoperative anemia due to acute blood loss   1-Symptomatic anemia, Post Operative Bleeding,  Occult GI bleed: -Patient presented with a hemoglobin of 7 down from 04 Jul 2021. -FOBT positive.  Normal stool. -She has had significant bloody output from her surgical drain and this likely accounts for the majority of her blood loss.  -Plastic surgery and GI consulted. -Received one unit PRBC 1/28. One unit 1/29. -plan for conservative treatment, blood transfusion, monitor drain out put. Plan to hold on given TXA due to risk of meds.  Anemia panel. Normal.  Start ferrous sulfate.   Depression, anxiety Continue with Latuda and Celexa   Chronic pain syndrome: Continue with Percocet and  Flexeril   History of hypertension Resolved with weight loss, no longer on antihypertensive.  Constipation; Continue with Miralax. Had BM     Estimated body mass index is 44.65 kg/m as calculated from the following:   Height as of this encounter: '5\' 5"'$  (1.651 m).   Weight as of this encounter: 121.7 kg.   DVT prophylaxis: SCD Code Status: Full code Family Communication: Care discussed with patient Disposition Plan:  Status is: Observation The patient remains OBS appropriate and will d/c before 2 midnights.    Consultants:  Plastic Sx GI  Procedures:    Antimicrobials:    Subjective: She is feeling well. Continue to  have bloody out put from surgical site   Objective: Vitals:   03/25/22 0411 03/25/22 0614 03/25/22 0803 03/25/22 1607  BP: (!) 91/42  118/66 134/65  Pulse: 80  92 88  Resp: '16  16 16  '$ Temp: 97.9 F (36.6 C)  (!) 97.5 F (36.4 C) (!) 97.5 F (36.4 C)  TempSrc: Oral  Oral Oral  SpO2: 98%  93% 99%  Weight:  121.7 kg    Height:        Intake/Output Summary (Last 24 hours) at 03/25/2022 1742 Last data filed at 03/25/2022 8119 Gross per 24 hour  Intake 461.87 ml  Output 280 ml  Net 181.87 ml    Filed Weights   03/22/22 1635 03/25/22 0614  Weight: 117.5 kg 121.7 kg    Examination:  General exam: NAD Respiratory system: CTA Cardiovascular system:  S 1, S 2 RRR Gastrointestinal system: BS present, soft, NT, JP drain in  place bloody content. Marland Kitchen  Data Reviewed: I have personally reviewed following labs and imaging studies  CBC: Recent Labs  Lab 03/22/22 1708 03/23/22 0851 03/24/22 0518 03/24/22 1455 03/25/22 0356  WBC 10.7* 10.8* 7.9 9.0 8.5  NEUTROABS 6.4  --   --   --   --   HGB 7.1* 8.3* 7.1* 7.8* 8.6*  HCT 22.4* 25.3* 22.5* 23.5* 27.3*  MCV 94.1 92.3 93.8 93.3 93.8  PLT 279 282 322 343 500    Basic Metabolic Panel: Recent Labs  Lab 03/22/22 1708 03/23/22 0500 03/24/22 0518 03/25/22 0359  NA 137 138 136 140  K 5.0  4.3 4.0 4.3  CL 103 105 101 103  CO2 '27 25 28 29  '$ GLUCOSE 108* 109* 90 108*  BUN '20 13 6 7  '$ CREATININE 0.93 0.82 0.74 0.66  CALCIUM 8.9 8.7* 9.1 9.3  MG  --  2.0  --   --     GFR: Estimated Creatinine Clearance: 97.9 mL/min (by C-G formula based on SCr of 0.66 mg/dL). Liver Function Tests: Recent Labs  Lab 03/22/22 1708  AST 29  ALT 13  ALKPHOS 59  BILITOT 1.0  PROT 6.3*  ALBUMIN 2.9*    No results for input(s): "LIPASE", "AMYLASE" in the last 168 hours. No results for input(s): "AMMONIA" in the last 168 hours. Coagulation Profile: Recent Labs  Lab 03/24/22 0813  INR 1.1    Cardiac Enzymes: No results for input(s): "CKTOTAL", "CKMB", "CKMBINDEX", "TROPONINI" in the last 168 hours. BNP (last 3 results) No results for input(s): "PROBNP" in the last 8760 hours. HbA1C: No results for input(s): "HGBA1C" in the last 72 hours. CBG: No results for input(s): "GLUCAP" in the last 168 hours. Lipid Profile: No results for input(s): "CHOL", "HDL", "LDLCALC", "TRIG", "CHOLHDL", "LDLDIRECT" in the last 72 hours. Thyroid Function Tests: No results for input(s): "TSH", "T4TOTAL", "FREET4", "T3FREE", "THYROIDAB" in the last 72 hours. Anemia Panel: Recent Labs    03/24/22 0808  VITAMINB12 1,003*  FOLATE >40.0  FERRITIN 183  TIBC 301  IRON 49  RETICCTPCT 3.7*    Sepsis Labs: No results for input(s): "PROCALCITON", "LATICACIDVEN" in the last 168 hours.  No results found for this or any previous visit (from the past 240 hour(s)).       Radiology Studies: No results found.      Scheduled Meds:  bisacodyl  10 mg Rectal Once   brimonidine  1 drop Both Eyes Q12H   citalopram  40 mg Oral Daily   cyclobenzaprine  10 mg Oral TID   cycloSPORINE  1 drop Both Eyes BID   ferrous sulfate  325 mg Oral Q breakfast   ketotifen  1 drop Both Eyes BID   latanoprost  1 drop Both Eyes QHS   lurasidone  60 mg Oral QHS   polyethylene glycol  17 g Oral BID   pravastatin  20 mg  Oral Daily   senna-docusate  1 tablet Oral BID   sodium chloride flush  3 mL Intravenous Q12H   timolol  1 drop Both Eyes BID   Continuous Infusions:  lactated ringers 75 mL/hr at 03/25/22 0518     LOS: 1 day    Time spent: 35 minutes     Taige Housman A Airyn Ellzey, MD [facility]   If 7PM-7AM, please contact night-coverage [email or web address]  03/25/2022, 5:42 PM

## 2022-03-25 NOTE — TOC CM/SW Note (Signed)
  Transition of Care [facility]) Screening Note   Patient Details  Name: Carla Sloan Date of Birth: 1960/03/22   Transition of Care Department [facility]) has reviewed patient and no TOC needs have been identified at this time. We will continue to monitor patient advancement through interdisciplinary progression rounds. If new patient transition needs arise, please place a TOC consult.

## 2022-03-25 NOTE — Progress Notes (Signed)
  Subjective:  Postop day #6 status post panniculectomy by Dr. Marla Roe on 03/19/2021 Patient resting comfortably in exam bed in no acute distress She reports she is doing well and would like to go home soon as possible She denies any fever, chest pain shortness of breath, no swelling in her lower extremities  Objective: Vital signs in last 24 hours: Temp:  [97.5 F (36.4 C)-98.3 F (36.8 C)] 97.5 F (36.4 C) (01/30 0803) Pulse Rate:  [78-92] 92 (01/30 0803) Resp:  [14-16] 16 (01/30 0803) BP: (91-131)/(42-71) 118/66 (01/30 0803) SpO2:  [93 %-100 %] 93 % (01/30 0803) Weight:  [121.7 kg] 121.7 kg (01/30 0614) Last BM Date : 03/24/22   Physical Exam:  General: Pt resting comfortably in no acute distress Abdomen soft with no palpable fluid collections, no areas of firmness No drainage from around drain site JP drain with dark bloody output.  340 cc output over the last 24 hours   Assessment/Plan: s/p    Postop day #6 status post panniculectomy Patient doing well this morning, she is eager to get out of the hospital Drain output with approximate 340 cc over the last 24 hours this does appear to be slowing down Hemoglobin today 8.6 at 3:56 AM We will continue to monitor her and hold off on TXA given the risks of this medication.  The patient agrees that continuing with a careful watch and wait approach would be appropriate but if bleeding continues we will consider TXA. Encourage frequent ambulation and use of SCDs   Elmer Ramp, [place] 03/25/2022

## 2022-03-26 DIAGNOSIS — D649 Anemia, unspecified: Secondary | ICD-10-CM | POA: Diagnosis not present

## 2022-03-26 LAB — CBC
HCT: 24.4 % — ABNORMAL LOW (ref 36.0–46.0)
Hemoglobin: 7.7 g/dL — ABNORMAL LOW (ref 12.0–15.0)
MCH: 29.8 pg (ref 26.0–34.0)
MCHC: 31.6 g/dL (ref 30.0–36.0)
MCV: 94.6 fL (ref 80.0–100.0)
Platelets: 345 10*3/uL (ref 150–400)
RBC: 2.58 MIL/uL — ABNORMAL LOW (ref 3.87–5.11)
RDW: 14.6 % (ref 11.5–15.5)
WBC: 7.7 10*3/uL (ref 4.0–10.5)
nRBC: 0 % (ref 0.0–0.2)

## 2022-03-26 NOTE — Plan of Care (Signed)
  Problem: Education: Goal: Knowledge of General Education information will improve Description: Including pain rating scale, medication(s)/side effects and non-pharmacologic comfort measures Outcome: Progressing   Problem: Health Behavior/Discharge Planning: Goal: Ability to manage health-related needs will improve Outcome: Progressing   Problem: Clinical Measurements: Goal: Respiratory complications will improve Outcome: Progressing   

## 2022-03-26 NOTE — Progress Notes (Signed)
  Subjective:  Post Op Day #7 SP panniculectomy Pt reports that she is doing well with no complaints this AM She has been up and walking, no leg swelling, SOB   Objective: Vital signs in last 24 hours: Temp:  [97.5 F (36.4 C)-98.1 F (36.7 C)] 98.1 F (36.7 C) (01/31 0857) Pulse Rate:  [83-107] 86 (01/31 0857) Resp:  [16-17] 17 (01/31 0857) BP: (99-134)/(56-74) 99/56 (01/31 0857) SpO2:  [99 %-100 %] 100 % (01/31 0857) Last BM Date : 03/24/22   Physical Exam:  General: Pt resting comfortably in no acute distress Abdomen soft with no palpable fluid collections, no areas of firmness No drainage from around drain site JP drain with dark bloody output.  450 cc output over the last 24 hours   Assessment/Plan:  - POD # 7 SP panniculectomy  - Doing well this am, no complaints - Drain with ongoing output. Nursing has been keeping totals at bedside that show 450 cc/24hrs, totals   listed on Epic do not match. Nursing staff to keep close eye on drain output - Hemoglobin slightly down to 7.7 from 8.6 yesterday - Case discussed with Dr. Marla Roe, plan to continue to monitor drain output and trend CBC   Elmer Ramp, PA-C 03/26/2022

## 2022-03-26 NOTE — Progress Notes (Signed)
PROGRESS NOTE  Carla Sloan [id number]:[id number] DOB: 10/06/1960 DOA: 03/22/2022 PCP: [name], NP-C   LOS: 2 days   Brief Narrative / Interim history: 62 year old with HTN, depression, anxiety, morbid obesity status post panniculectomy on 02/27/2022 comes into the hospital with tachycardia, was found to have anemia and was admitted to the hospital.  Incidentally fecal occult was positive, GI consulted but did not recommend any interventions right now given no frank bleeding.  Plastic surgery was also consulted.  She was transfused a total of 2 units of packed red blood cells  Subjective / 24h Interval events: She is doing well this morning, denies any abdominal pain, no nausea or vomiting.  Assesement and Plan: Principal Problem:   Symptomatic anemia Active Problems:   Occult GI bleeding   Depression with anxiety   Postoperative anemia due to acute blood loss   Principal problem Acute blood loss anemia -postoperatively, received a total of 2 units of packed red blood cells.  Plastic surgery consulted, appreciate input.  Drain output continues to be 200 cc over the last 24 hours. -Hemoglobin trending down, 7.7 this morning, continue to closely monitor.  Transfuse for less than 7  Active problems FOBT positive -likely incidental, she has no frank melena or bright red blood per rectum.  GI consulted, no need for procedures right now, signed off.  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  Scheduled Meds:  bisacodyl  10 mg Rectal Once   brimonidine  1 drop Both Eyes Q12H   citalopram  40 mg Oral Daily   cyclobenzaprine  10 mg Oral TID   cycloSPORINE  1 drop Both Eyes BID   ferrous sulfate  325 mg Oral Q breakfast   ketotifen  1 drop Both Eyes BID   latanoprost  1 drop Both Eyes QHS   lurasidone  60 mg Oral QHS   polyethylene  glycol  17 g Oral BID   pravastatin  20 mg Oral Daily   senna-docusate  1 tablet Oral BID   sodium chloride flush  3 mL Intravenous Q12H   timolol  1 drop Both Eyes BID   Continuous Infusions: PRN Meds:.acetaminophen **OR** acetaminophen, bisacodyl, ondansetron **OR** ondansetron (ZOFRAN) IV, oxyCODONE-acetaminophen **AND** oxyCODONE  Current Outpatient Medications  Medication Instructions   acetaminophen (TYLENOL) 500 mg, Oral, Every 6 hours PRN   azelastine (OPTIVAR) 0.05 % ophthalmic solution 1 drop, Both Eyes, 2 times daily   bimatoprost (LUMIGAN) 0.03 % ophthalmic solution 1 drop, Both Eyes, Daily at bedtime   brimonidine-timolol (COMBIGAN) 0.2-0.5 % ophthalmic solution 1 drop, Both Eyes, Every 12 hours   calcium citrate-vitamin D (CELEBRATE CALCIUM CITRATE) 500-500 MG-UNIT chewable tablet 1 tablet, Oral, Every evening   CEQUA 0.09 % SOLN 1 drop, Both Eyes, 2 times daily   citalopram (CELEXA) 40 mg, Oral, Daily   cyclobenzaprine (FLEXERIL) 10 mg, Oral, 3 times daily   diclofenac Sodium (VOLTAREN) 4 g, Topical, 4 times daily   Elastic Bandages & Supports (WRIST SPLINT/COCK-UP/LEFT L) MISC Wear nightly and with exacerbating activities.   Elastic Bandages & Supports (WRIST SPLINT/COCK-UP/RIGHT L) MISC Wear nightly and with exacerbating activities.   enoxaparin (LOVENOX) 40 mg, Subcutaneous, Every 24 hours, Recommend starting 24 hours after surgery.   ergocalciferol (VITAMIN D2) 50,000 Units, Oral, Weekly   ferrous sulfate 324 mg, Oral, Daily with breakfast   Latuda 60 mg, Oral, Daily at bedtime   lidocaine (LIDODERM)  5 % 1 patch, Transdermal, Every 24 hours, Remove & Discard patch within 12 hours or as directed by MD   Multiple Vitamins-Minerals (CELEBRATE MULTI-COMPLETE 45) CHEW 1 each, Oral, Daily   ondansetron (ZOFRAN) 4 mg, Oral, Every 8 hours PRN   orphenadrine (NORFLEX) 100 mg, Oral, 2 times daily   oxyCODONE-acetaminophen (PERCOCET) 10-325 MG tablet Take 1 tablet by mouth twice  a day as needed.   pravastatin (PRAVACHOL) 20 mg, Oral, Daily    Diet Orders (From admission, onward)     Start     Ordered   03/25/22 1222  Diet regular Room service appropriate? Yes; Fluid consistency: Thin  Diet effective now       Question Answer Comment  Room service appropriate? Yes   Fluid consistency: Thin      03/25/22 1221            DVT prophylaxis: Place and maintain sequential compression device Start: 03/23/22 0714 SCDs Start: 03/22/22 2339   Lab Results  Component Value Date   PLT 345 03/26/2022      Code Status: Full Code  Family Communication: no family at bedside   Status is: Inpatient  Remains inpatient appropriate because: Hb trending down, high drain output  Level of care: Telemetry Surgical  Consultants:  Plastic surgery  GI  Objective: Vitals:   03/25/22 1607 03/25/22 2126 03/26/22 0547 03/26/22 0857  BP: 134/65 122/74 (!) 118/59 (!) 99/56  Pulse: 88 (!) 107 83 86  Resp: 16   17  Temp: (!) 97.5 F (36.4 C) 98 F (36.7 C) 98.1 F (36.7 C) 98.1 F (36.7 C)  TempSrc: Oral Oral Oral Oral  SpO2: 99% 100% 100% 100%  Weight:      Height:        Intake/Output Summary (Last 24 hours) at 03/26/2022 1014 Last data filed at 03/26/2022 0753 Gross per 24 hour  Intake 120 ml  Output 260 ml  Net -140 ml   Wt Readings from Last 3 Encounters:  03/25/22 121.7 kg  03/19/22 120.9 kg  03/07/22 120.9 kg    Examination:  Constitutional: NAD Eyes: no scleral icterus ENMT: Mucous membranes are moist.  Neck: normal, supple Respiratory: clear to auscultation bilaterally, no wheezing, no crackles. Normal respiratory effort. No accessory muscle use.  Cardiovascular: Regular rate and rhythm, no murmurs / rubs / gallops. No LE edema.  Abdomen: non distended, no tenderness. Bowel sounds positive.  Musculoskeletal: no clubbing / cyanosis.    Data Reviewed: I have independently reviewed following labs and imaging studies   CBC Recent Labs   Lab 03/22/22 1708 03/23/22 0851 03/24/22 0518 03/24/22 1455 03/25/22 0356 03/26/22 0400  WBC 10.7* 10.8* 7.9 9.0 8.5 7.7  HGB 7.1* 8.3* 7.1* 7.8* 8.6* 7.7*  HCT 22.4* 25.3* 22.5* 23.5* 27.3* 24.4*  PLT 279 282 322 343 378 345  MCV 94.1 92.3 93.8 93.3 93.8 94.6  MCH 29.8 30.3 29.6 31.0 29.6 29.8  MCHC 31.7 32.8 31.6 33.2 31.5 31.6  RDW 13.6 13.9 13.8 14.0 14.3 14.6  LYMPHSABS 3.4  --   --   --   --   --   MONOABS 0.8  --   --   --   --   --   EOSABS 0.0  --   --   --   --   --   BASOSABS 0.0  --   --   --   --   --     Recent Labs  Lab 03/22/22 1708  03/23/22 0500 03/24/22 0518 03/24/22 0813 03/25/22 0359  NA 137 138 136  --  140  K 5.0 4.3 4.0  --  4.3  CL 103 105 101  --  103  CO2 '27 25 28  '$ --  29  GLUCOSE 108* 109* 90  --  108*  BUN '20 13 6  '$ --  7  CREATININE 0.93 0.82 0.74  --  0.66  CALCIUM 8.9 8.7* 9.1  --  9.3  AST 29  --   --   --   --   ALT 13  --   --   --   --   ALKPHOS 59  --   --   --   --   BILITOT 1.0  --   --   --   --   ALBUMIN 2.9*  --   --   --   --   MG  --  2.0  --   --   --   INR  --   --   --  1.1  --     ------------------------------------------------------------------------------------------------------------------ No results for input(s): "CHOL", "HDL", "LDLCALC", "TRIG", "CHOLHDL", "LDLDIRECT" in the last 72 hours.  No results found for: "HGBA1C" ------------------------------------------------------------------------------------------------------------------ No results for input(s): "TSH", "T4TOTAL", "T3FREE", "THYROIDAB" in the last 72 hours.  Invalid input(s): "FREET3"  Cardiac Enzymes No results for input(s): "CKMB", "TROPONINI", "MYOGLOBIN" in the last 168 hours.  Invalid input(s): "CK" ------------------------------------------------------------------------------------------------------------------ No results found for: "BNP"  CBG: No results for input(s): "GLUCAP" in the last 168 hours.  No results found for this or  any previous visit (from the past 240 hour(s)).   Radiology Studies: No results found.   Marzetta Board, MD, PhD [facility]  Between 7 am - 7 pm I am available, please contact me via Amion (for emergencies) or Securechat (non urgent messages)  Between 7 pm - 7 am I am not available, please contact night coverage MD/APP via Amion

## 2022-03-27 DIAGNOSIS — D649 Anemia, unspecified: Secondary | ICD-10-CM | POA: Diagnosis not present

## 2022-03-27 LAB — CBC
HCT: 27.1 % — ABNORMAL LOW (ref 36.0–46.0)
Hemoglobin: 8.6 g/dL — ABNORMAL LOW (ref 12.0–15.0)
MCH: 30.3 pg (ref 26.0–34.0)
MCHC: 31.7 g/dL (ref 30.0–36.0)
MCV: 95.4 fL (ref 80.0–100.0)
Platelets: 403 10*3/uL — ABNORMAL HIGH (ref 150–400)
RBC: 2.84 MIL/uL — ABNORMAL LOW (ref 3.87–5.11)
RDW: 14.7 % (ref 11.5–15.5)
WBC: 7.4 10*3/uL (ref 4.0–10.5)
nRBC: 0 % (ref 0.0–0.2)

## 2022-03-27 NOTE — Progress Notes (Signed)
  Subjective:  Postop day #8 status post panniculectomy Patient was up ambulating the halls upon arrival, she notes she is doing very well, she has no lightheaded or dizziness, no significant abdominal pain. No lower extremity swelling  Objective: Vital signs in last 24 hours: Temp:  [98.2 F (36.8 C)-98.7 F (37.1 C)] 98.2 F (36.8 C) (02/01 0900) Pulse Rate:  [72-108] 108 (02/01 0900) Resp:  [16-17] 16 (02/01 0900) BP: (96-141)/(53-69) 141/69 (02/01 0900) SpO2:  [99 %-100 %] 100 % (02/01 0900) Weight:  [119.3 kg] 119.3 kg (02/01 0518) Last BM Date : 03/26/22   Physical Exam:  General: Pt resting comfortably in no acute distress Abdomen soft with no palpable fluid collections, no areas of firmness. No drainage from around the drainage site JP drain with dark bloody output, approximately 400 cc over the last 24 hours   Assessment/Plan: s/p panniculectomy  -  Postop day #8 status post panniculectomy -  Doing well this a.m. with no significant complaints -  Drain continues to have ongoing output, does appear to be slightly less than yesterday at approximately 400 cc / 24 hours.  We will continue use bedside drain output readings as the chart does not reflect regular amounts. -  Hemoglobin today 8.6 up from 7.7 yesterday, will continue to trend  Overall does appear she is improving, her drain still is putting out a good amount of dark bloody fluid but is less than yesterday, she is asymptomatic and her hemoglobin is rising.  We will continue to watch her for another day, if she has no significant drop in hemoglobin or increased drain output we will consider discharge home.  The patient is happy with today's plan.  We will reassess tomorrow morning.  We remain available for any questions or concerns.   Stevie Kern Samon Dishner, PA-C 03/27/2022

## 2022-03-27 NOTE — Care Management Important Message (Signed)
Important Message  Patient Details  Name: Carla Sloan MRN: [id number] Date of Birth: 1960-11-01   Medicare Important Message Given:  Yes     Hannah Beat 03/27/2022, 4:00 PM

## 2022-03-27 NOTE — Progress Notes (Signed)
PROGRESS NOTE  Carla Sloan [id number] DOB: 07-31-1960 DOA: 03/22/2022 PCP: [name], NP-C   LOS: 3 days   Brief Narrative / Interim history: 62 year old with HTN, depression, anxiety, morbid obesity status post panniculectomy on 02/27/2022 comes into the hospital with tachycardia, was found to have anemia and was admitted to the hospital.  Incidentally fecal occult was positive, GI consulted but did not recommend any interventions right now given no frank bleeding.  Plastic surgery was also consulted.  She was transfused a total of 2 units of packed red blood cells  Subjective / 24h Interval events: Doing well this morning, walking in the hallway without difficulties.  Assesement and Plan: Principal Problem:   Symptomatic anemia Active Problems:   Occult GI bleeding   Depression with anxiety   Postoperative anemia due to acute blood loss   Principal problem Acute blood loss anemia -postoperatively, received a total of 2 units of packed red blood cells.  Plastic surgery consulted, appreciate input.  Drain output apparently not well charted, patient has her own charting paper.  Discussed with plastic surgery, continues to have significant drainage, not ready for discharge -Hemoglobin overall stable, improving to 8.6 this morning.  Active problems FOBT positive -likely incidental, she has no frank melena or bright red blood per rectum.  GI consulted, no need for procedures right now, signed off.  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, actively working on weight loss  Scheduled Meds:  bisacodyl  10 mg Rectal Once   brimonidine  1 drop Both Eyes Q12H   citalopram  40 mg Oral Daily   cyclobenzaprine  10 mg Oral TID   cycloSPORINE  1 drop Both Eyes BID   ferrous sulfate  325 mg Oral Q breakfast   ketotifen  1 drop Both  Eyes BID   latanoprost  1 drop Both Eyes QHS   lurasidone  60 mg Oral QHS   polyethylene glycol  17 g Oral BID   pravastatin  20 mg Oral Daily   senna-docusate  1 tablet Oral BID   sodium chloride flush  3 mL Intravenous Q12H   timolol  1 drop Both Eyes BID   Continuous Infusions: PRN Meds:.acetaminophen **OR** acetaminophen, bisacodyl, ondansetron **OR** ondansetron (ZOFRAN) IV, oxyCODONE-acetaminophen **AND** oxyCODONE  Current Outpatient Medications  Medication Instructions   acetaminophen (TYLENOL) 500 mg, Oral, Every 6 hours PRN   azelastine (OPTIVAR) 0.05 % ophthalmic solution 1 drop, Both Eyes, 2 times daily   bimatoprost (LUMIGAN) 0.03 % ophthalmic solution 1 drop, Both Eyes, Daily at bedtime   brimonidine-timolol (COMBIGAN) 0.2-0.5 % ophthalmic solution 1 drop, Both Eyes, Every 12 hours   calcium citrate-vitamin D (CELEBRATE CALCIUM CITRATE) 500-500 MG-UNIT chewable tablet 1 tablet, Oral, Every evening   CEQUA 0.09 % SOLN 1 drop, Both Eyes, 2 times daily   citalopram (CELEXA) 40 mg, Oral, Daily   cyclobenzaprine (FLEXERIL) 10 mg, Oral, 3 times daily   diclofenac Sodium (VOLTAREN) 4 g, Topical, 4 times daily   Elastic Bandages & Supports (WRIST SPLINT/COCK-UP/LEFT L) MISC Wear nightly and with exacerbating activities.   Elastic Bandages & Supports (WRIST SPLINT/COCK-UP/RIGHT L) MISC Wear nightly and with exacerbating activities.   enoxaparin (LOVENOX) 40 mg, Subcutaneous, Every 24 hours, Recommend starting 24 hours after surgery.   ergocalciferol (VITAMIN D2) 50,000 Units, Oral, Weekly   ferrous sulfate 324 mg, Oral, Daily with breakfast   Latuda 60 mg, Oral,  Daily at bedtime   lidocaine (LIDODERM) 5 % 1 patch, Transdermal, Every 24 hours, Remove & Discard patch within 12 hours or as directed by MD   Multiple Vitamins-Minerals (CELEBRATE MULTI-COMPLETE 45) CHEW 1 each, Oral, Daily   ondansetron (ZOFRAN) 4 mg, Oral, Every 8 hours PRN   orphenadrine (NORFLEX) 100 mg, Oral, 2  times daily   oxyCODONE-acetaminophen (PERCOCET) 10-325 MG tablet Take 1 tablet by mouth twice a day as needed.   pravastatin (PRAVACHOL) 20 mg, Oral, Daily    Diet Orders (From admission, onward)     Start     Ordered   03/25/22 1222  Diet regular Room service appropriate? Yes; Fluid consistency: Thin  Diet effective now       Question Answer Comment  Room service appropriate? Yes   Fluid consistency: Thin      03/25/22 1221            DVT prophylaxis: Place and maintain sequential compression device Start: 03/23/22 0714 SCDs Start: 03/22/22 2339   Lab Results  Component Value Date   PLT 403 (H) 03/27/2022      Code Status: Full Code  Family Communication: no family at bedside   Status is: Inpatient  Remains inpatient appropriate because: High drain output, not cleared by plastic surgery  Level of care: Telemetry Surgical  Consultants:  Plastic surgery  GI  Objective: Vitals:   03/26/22 1835 03/26/22 2023 03/27/22 0518 03/27/22 0900  BP: (!) 108/53 (!) 103/56 103/67 (!) 141/69  Pulse: 94 80 72 (!) 108  Resp: '16 17 17 16  '$ Temp: 98.2 F (36.8 C) 98.2 F (36.8 C) 98.3 F (36.8 C) 98.2 F (36.8 C)  TempSrc: Oral Oral Oral Oral  SpO2: 100% 99% 100% 100%  Weight:   119.3 kg   Height:        Intake/Output Summary (Last 24 hours) at 03/27/2022 9741 Last data filed at 03/27/2022 0825 Gross per 24 hour  Intake 1240 ml  Output 373 ml  Net 867 ml    Wt Readings from Last 3 Encounters:  03/27/22 119.3 kg  03/19/22 120.9 kg  03/07/22 120.9 kg    Examination:  Constitutional: NAD Eyes: lids and conjunctivae normal, no scleral icterus ENMT: mmm Neck: normal, supple Respiratory: clear to auscultation bilaterally, no wheezing, no crackles. Normal respiratory effort.  Cardiovascular: Regular rate and rhythm, no murmurs / rubs / gallops. No LE edema. Abdomen: soft, no distention, no tenderness. Bowel sounds positive.  Skin: no rashes  Data Reviewed: I  have independently reviewed following labs and imaging studies   CBC Recent Labs  Lab 03/22/22 1708 03/23/22 0851 03/24/22 0518 03/24/22 1455 03/25/22 0356 03/26/22 0400 03/27/22 0227  WBC 10.7*   < > 7.9 9.0 8.5 7.7 7.4  HGB 7.1*   < > 7.1* 7.8* 8.6* 7.7* 8.6*  HCT 22.4*   < > 22.5* 23.5* 27.3* 24.4* 27.1*  PLT 279   < > 322 343 378 345 403*  MCV 94.1   < > 93.8 93.3 93.8 94.6 95.4  MCH 29.8   < > 29.6 31.0 29.6 29.8 30.3  MCHC 31.7   < > 31.6 33.2 31.5 31.6 31.7  RDW 13.6   < > 13.8 14.0 14.3 14.6 14.7  LYMPHSABS 3.4  --   --   --   --   --   --   MONOABS 0.8  --   --   --   --   --   --  EOSABS 0.0  --   --   --   --   --   --   BASOSABS 0.0  --   --   --   --   --   --    < > = values in this interval not displayed.     Recent Labs  Lab 03/22/22 1708 03/23/22 0500 03/24/22 0518 03/24/22 0813 03/25/22 0359  NA 137 138 136  --  140  K 5.0 4.3 4.0  --  4.3  CL 103 105 101  --  103  CO2 '27 25 28  '$ --  29  GLUCOSE 108* 109* 90  --  108*  BUN '20 13 6  '$ --  7  CREATININE 0.93 0.82 0.74  --  0.66  CALCIUM 8.9 8.7* 9.1  --  9.3  AST 29  --   --   --   --   ALT 13  --   --   --   --   ALKPHOS 59  --   --   --   --   BILITOT 1.0  --   --   --   --   ALBUMIN 2.9*  --   --   --   --   MG  --  2.0  --   --   --   INR  --   --   --  1.1  --      ------------------------------------------------------------------------------------------------------------------ No results for input(s): "CHOL", "HDL", "LDLCALC", "TRIG", "CHOLHDL", "LDLDIRECT" in the last 72 hours.  No results found for: "HGBA1C" ------------------------------------------------------------------------------------------------------------------ No results for input(s): "TSH", "T4TOTAL", "T3FREE", "THYROIDAB" in the last 72 hours.  Invalid input(s): "FREET3"  Cardiac Enzymes No results for input(s): "CKMB", "TROPONINI", "MYOGLOBIN" in the last 168 hours.  Invalid input(s):  "CK" ------------------------------------------------------------------------------------------------------------------ No results found for: "BNP"  CBG: No results for input(s): "GLUCAP" in the last 168 hours.  No results found for this or any previous visit (from the past 240 hour(s)).   Radiology Studies: No results found.   Marzetta Board, MD, PhD [facility]  Between 7 am - 7 pm I am available, please contact me via Amion (for emergencies) or Securechat (non urgent messages)  Between 7 pm - 7 am I am not available, please contact night coverage MD/APP via Amion

## 2022-03-28 ENCOUNTER — Encounter: Payer: Medicaid Other | Admitting: Surgical

## 2022-03-28 DIAGNOSIS — D649 Anemia, unspecified: Secondary | ICD-10-CM | POA: Diagnosis not present

## 2022-03-28 LAB — BASIC METABOLIC PANEL
Anion gap: 9 (ref 5–15)
BUN: 10 mg/dL (ref 6–20)
CO2: 24 mmol/L (ref 22–32)
Calcium: 8.7 mg/dL — ABNORMAL LOW (ref 8.9–10.3)
Chloride: 101 mmol/L (ref 98–111)
Creatinine, Ser: 0.89 mg/dL (ref 0.44–1.00)
GFR, Estimated: >60 mL/min (ref >60)
Glucose, Bld: 195 mg/dL — ABNORMAL HIGH (ref 70–99)
Potassium: 3.8 mmol/L (ref 3.5–5.1)
Sodium: 134 mmol/L — ABNORMAL LOW (ref 135–145)

## 2022-03-28 LAB — CBC
HCT: 30 % — ABNORMAL LOW (ref 36.0–46.0)
Hemoglobin: 9.8 g/dL — ABNORMAL LOW (ref 12.0–15.0)
MCH: 30.2 pg (ref 26.0–34.0)
MCHC: 32.7 g/dL (ref 30.0–36.0)
MCV: 92.6 fL (ref 80.0–100.0)
Platelets: 437 10*3/uL — ABNORMAL HIGH (ref 150–400)
RBC: 3.24 MIL/uL — ABNORMAL LOW (ref 3.87–5.11)
RDW: 14.7 % (ref 11.5–15.5)
WBC: 11 10*3/uL — ABNORMAL HIGH (ref 4.0–10.5)
nRBC: 0 % (ref 0.0–0.2)

## 2022-03-28 NOTE — Discharge Summary (Signed)
Physician Discharge Summary  Carla Sloan UUV:[id number] DOB: 1960-10-12 DOA: 03/22/2022  PCP: [name], NP-C  Admit date: 03/22/2022 Discharge date: 03/28/2022  Admitted From: home Disposition:  home  Recommendations for Outpatient Follow-up:  Follow up with Plastic surgery next week Please obtain BMP/CBC in one week  Home Health: none Equipment/Devices: none  Discharge Condition: stable CODE STATUS: Full code  HPI: Per admitting MD, Carla Sloan is a pleasant 62 y.o. female with medical history significant for hypertension, depression, anxiety, BMI 43, and status post panniculectomy on 03/19/2022 who now presents to the emergency department with constipation and tachycardia. Patient underwent panniculectomy on 03/19/2022, had increased bloody output from her surgical drain after starting prophylactic Lovenox.  She was instructed to hold the Lovenox on 03/21/2022 but has continued to experience bloody drainage.  She has also had difficulty moving her bowels since the surgery and states that she called EMS for this reason.  EMS found her heart rate to be in the 150s and brought her into the ED.  The patient denies any chest pain, palpitations, shortness of breath, lightheadedness, abdominal pain, melena, hematochezia, or hematemesis.  Hospital Course / Discharge diagnoses: Principal Problem:   Symptomatic anemia Active Problems:   Occult GI bleeding   Depression with anxiety   Postoperative anemia due to acute blood loss   Principal problem Acute blood loss anemia -postoperatively, received a total of 2 units of packed red blood cells.  Plastic surgery consulted, appreciate input, followed patient while hospitalized.  She still has some amount of output in her drain, however it is stable.  Following transfusions, hemoglobin has stabilized and actually improving on its own.  Discussed with plastic surgery, okay to discharge home and they will follow-up in  office early next week.  She has remained stable, clinically feels back to baseline and is ambulating in the hallways without difficulties.  Active problems FOBT positive -likely incidental, she has no frank melena or bright red blood per rectum.  GI consulted, no need for procedures right now, signed off. 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 What changed: reasons to take this   azelastine 0.05 % ophthalmic solution Commonly known as: OPTIVAR Place 1 drop into both eyes 2 (two) times daily.   bimatoprost 0.03 % ophthalmic solution Commonly known as: LUMIGAN Place 1 drop into both eyes at bedtime.   Celebrate Calcium Citrate 500-12.5 MG-MCG Chew Generic drug: Calcium Citrate-Vitamin D Chew 1 tablet by mouth every evening.   Celebrate Multi-Complete 45 Chew Chew 1 each by mouth daily.   Cequa 0.09 % Soln Generic drug: cycloSPORINE (PF) Place 1 drop into both eyes 2 (two) times daily.   citalopram 40 MG tablet Commonly known as: CELEXA Take 40 mg by mouth daily.   Combigan 0.2-0.5 % ophthalmic solution Generic drug: brimonidine-timolol Place 1 drop into both eyes every 12 (twelve) hours.  cyclobenzaprine 10 MG tablet Commonly known as: FLEXERIL Take 10 mg by mouth 3 (three) times daily.   diclofenac Sodium 1 % Gel Commonly known as: Voltaren Apply 4 g topically 4 (four) times  daily.   ergocalciferol 1.25 MG (50000 UT) capsule Commonly known as: VITAMIN D2 Take 50,000 Units by mouth once a week.   ferrous sulfate 324 MG Tbec Take 324 mg by mouth daily with breakfast.   Latuda 60 MG Tabs Generic drug: Lurasidone HCl Take 60 mg by mouth at bedtime.   lidocaine 5 % Commonly known as: Lidoderm Place 1 patch onto the skin daily. Remove & Discard patch within 12 hours or as directed by MD   ondansetron 4 MG tablet Commonly known as: Zofran Take 1 tablet (4 mg total) by mouth every 8 (eight) hours as needed for nausea or vomiting.   orphenadrine 100 MG tablet Commonly known as: NORFLEX Take 1 tablet (100 mg total) by mouth 2 (two) times daily.   oxyCODONE-acetaminophen 10-325 MG tablet Commonly known as: Percocet Take 1 tablet by mouth twice a day as needed. What changed:  how much to take how to take this when to take this reasons to take this   pravastatin 20 MG tablet Commonly known as: PRAVACHOL Take 1 tablet (20 mg total) by mouth daily.   Wrist Splint/Cock-Up/Left L Misc Wear nightly and with exacerbating activities.   Wrist Splint/Cock-Up/Right L Misc Wear nightly and with exacerbating activities.       Consultations: Plastic surgery   Procedures/Studies:  No results found.   Subjective: - no chest pain, shortness of breath, no abdominal pain, nausea or vomiting.   Discharge Exam: BP 102/60 (BP Location: Left Arm)   Pulse 100   Temp 98 F (36.7 C) (Oral)   Resp 20   Ht '5\' 5"'$  (1.651 m)   Wt 119.3 kg   LMP 09/25/2015 (Approximate)   SpO2 99%   BMI 43.77 kg/m   General: Pt is alert, awake, not in acute distress Cardiovascular: RRR, S1/S2 +, no rubs, no gallops Respiratory: CTA bilaterally, no wheezing, no rhonchi Abdominal: Soft, NT, ND, bowel sounds + Extremities: no edema, no cyanosis    The results of significant diagnostics from this hospitalization (including imaging, microbiology, ancillary and laboratory)  are listed below for reference.     Microbiology: No results found for this or any previous visit (from the past 240 hour(s)).   Labs: Basic Metabolic Panel: Recent Labs  Lab 03/22/22 1708 03/23/22 0500 03/24/22 0518 03/25/22 0359 03/28/22 0824  NA 137 138 136 140 134*  K 5.0 4.3 4.0 4.3 3.8  CL 103 105 101 103 101  CO2 '27 25 28 29 24  '$ GLUCOSE 108* 109* 90 108* 195*  BUN '20 13 6 7 10  '$ CREATININE 0.93 0.82 0.74 0.66 0.89  CALCIUM 8.9 8.7* 9.1 9.3 8.7*  MG  --  2.0  --   --   --    Liver Function Tests: Recent Labs  Lab 03/22/22 1708  AST 29  ALT 13  ALKPHOS 59  BILITOT 1.0  PROT 6.3*  ALBUMIN 2.9*   CBC: Recent Labs  Lab 03/22/22 1708 03/23/22 0851 03/24/22 1455 03/25/22 0356 03/26/22 0400 03/27/22 0227 03/28/22 0824  WBC 10.7*   < > 9.0 8.5 7.7 7.4 11.0*  NEUTROABS 6.4  --   --   --   --   --   --   HGB 7.1*   < > 7.8* 8.6* 7.7* 8.6* 9.8*  HCT 22.4*   < > 23.5* 27.3* 24.4* 27.1* 30.0*  MCV 94.1   < > 93.3 93.8 94.6 95.4 92.6  PLT 279   < > 343 378 345 403* 437*   < > = values in this interval not displayed.   CBG: No results for input(s): "GLUCAP" in the last 168 hours. Hgb A1c No results for input(s): "HGBA1C" in the last 72 hours. Lipid Profile No results for input(s): "CHOL", "HDL", "LDLCALC", "TRIG", "CHOLHDL", "LDLDIRECT" in the last 72 hours. Thyroid function studies No results for input(s): "TSH", "T4TOTAL", "T3FREE", "THYROIDAB" in the last 72 hours.  Invalid input(s): "FREET3" Urinalysis    Component Value Date/Time   COLORURINE YELLOW 07/20/2021 0325   APPEARANCEUR CLEAR 07/20/2021 0325   LABSPEC 1.011 07/20/2021 0325   PHURINE 7.0 07/20/2021 0325   GLUCOSEU NEGATIVE 07/20/2021 0325   HGBUR NEGATIVE 07/20/2021 0325   BILIRUBINUR NEGATIVE 07/20/2021 0325   KETONESUR NEGATIVE 07/20/2021 0325   PROTEINUR NEGATIVE 07/20/2021 0325   UROBILINOGEN 0.2 07/17/2021 1617   NITRITE NEGATIVE 07/20/2021 0325   LEUKOCYTESUR MODERATE (A)  07/20/2021 0325    FURTHER DISCHARGE INSTRUCTIONS:   Get Medicines reviewed and adjusted: Please take all your medications with you for your next visit with your Primary MD   Laboratory/radiological data: Please request your Primary MD to go over all hospital tests and procedure/radiological results at the follow up, please ask your Primary MD to get all Hospital records sent to his/her office.   In some cases, they will be blood work, cultures and biopsy results pending at the time of your discharge. Please request that your primary care M.D. goes through all the records of your hospital data and follows up on these results.   Also Note the following: If you experience worsening of your admission symptoms, develop shortness of breath, life threatening emergency, suicidal or homicidal thoughts you must seek medical attention immediately by calling 911 or calling your MD immediately  if symptoms less severe.   You must read complete instructions/literature along with all the possible adverse reactions/side effects for all the Medicines you take and that have been prescribed to you. Take any new Medicines after you have completely understood and accpet all the possible adverse reactions/side effects.    Do not drive when taking Pain medications or sleeping medications (Benzodaizepines)   Do not take more than prescribed Pain, Sleep and Anxiety Medications. It is not advisable to combine anxiety,sleep and pain medications without talking with your primary care practitioner   Special Instructions: If you have smoked or chewed Tobacco  in the last 2 yrs please stop smoking, stop any regular Alcohol  and or any Recreational drug use.   Wear Seat belts while driving.   Please note: You were cared for by a hospitalist during your hospital stay. Once you are discharged, your primary care physician will handle any further medical issues. Please note that NO REFILLS for any discharge medications will  be authorized once you are discharged, as it is imperative that you return to your primary care physician (or establish a relationship with a primary care physician if you do not have one) for your post hospital discharge needs so that they can reassess your need for medications and monitor your lab values.  Time coordinating discharge: 35 minutes  SIGNED:  Marzetta Board, MD, PhD 03/28/2022, 10:49 AM

## 2022-03-28 NOTE — Progress Notes (Signed)
  Subjective:  Postop day #9 panniculectomy Patient resting in exam bed in no acute distress She notes she is doing well today with no dizziness or lightheadedness, no abdominal pain, no lower extremity swelling  Objective: Vital signs in last 24 hours: Temp:  [98 F (36.7 C)-99.2 F (37.3 C)] 98 F (36.7 C) (02/02 0832) Pulse Rate:  [90-109] 100 (02/02 0832) Resp:  [16-20] 20 (02/02 0832) BP: (99-121)/(49-89) 102/60 (02/02 0832) SpO2:  [99 %-100 %] 99 % (02/02 0832) Weight:  [119.3 kg] 119.3 kg (02/02 0500) Last BM Date : 03/27/22   Physical Exam:   General: Pt resting comfortably in no acute distress Abdomen soft with no palpable fluid collections, no areas of firmness No drainage from around the drainage site JP drain with dark bloody output, approximately 400 cc over the last 24 hours   Assessment/Plan:    -  Postop day #8 status post panniculectomy -  She is doing well this morning with no reported complaints -  Her drain continues to have a decent amount of output approximately 400 cc over the last 24 hours, this has remained stable over the last several days -  Her hemoglobin continues to trend up to 9.8 from 8.6 yesterday and 7.7 the day prior, this is reassuring  Overall the patient is doing well, she is eager to get out of the hospital and home.  She notes she has a lot of support at home and has nursing that comes into her house.  Given her stable drain output as well as her rising hemoglobin I do feel she is stable for discharge home.  The patient was on Lovenox prior to hospitalization, she should continue to hold this given her continued drain output.  She does understand that there is a risk of blood clots, this is weighed against the risk of bleeding.  Case reviewed with Dr. Marla Roe she is okay with discharge at this time.  I would like to see the patient in the office on Monday for repeat evaluation.  I did give the patient strict return precautions in the  event she develops any new or worsening signs or symptoms, she will reach out to [place] if she has any increased drain output or any concerning signs or symptoms.  She had no further questions or concerns.  Stevie Kern Corvin Sorbo, PA-C 03/28/2022

## 2022-03-31 ENCOUNTER — Ambulatory Visit (INDEPENDENT_AMBULATORY_CARE_PROVIDER_SITE_OTHER): Payer: Medicare HMO | Admitting: Student

## 2022-03-31 ENCOUNTER — Telehealth: Payer: Self-pay | Admitting: *Deleted

## 2022-03-31 ENCOUNTER — Telehealth: Payer: Self-pay

## 2022-03-31 ENCOUNTER — Encounter: Payer: Self-pay | Admitting: *Deleted

## 2022-03-31 VITALS — BP 103/57 | HR 73 | Temp 97.8°F

## 2022-03-31 DIAGNOSIS — S31121A Laceration of abdominal wall with foreign body, left upper quadrant without penetration into peritoneal cavity, initial encounter: Secondary | ICD-10-CM | POA: Diagnosis not present

## 2022-03-31 DIAGNOSIS — M793 Panniculitis, unspecified: Secondary | ICD-10-CM

## 2022-03-31 NOTE — Progress Notes (Signed)
Patient is a 62 year old female who underwent panniculectomy on 03/19/2022 with Dr. Marla Roe.  Patient was then admitted to to [facility] on 03/22/2022 for acute blood loss anemia.  During her hospitalization, patient received 2 units of PRBCs.  Her hemoglobin then stabilized and improved.  She was eventually discharged on 03/28/2022.  On the day of discharge, her JP drain was noted to have dark bloody output with approximately 400 cc over 24 hours.  Patient presents to the clinic today for postoperative follow-up.  She is approximately a week and a half postop.   Today, patient reports she is doing well.  She denies any issues since she has been discharged from the hospital.  Patient reports that on Saturday her drain put out approximately 397 cc and yesterday was approximately 405 cc. She denies any lightheadedness, dizziness, fevers or chills. Patient reports she is eating and drinking without issue. She states that she is having regular bowel movements and has been up and walking around without issue.   Vitals obtained at today's visit. Vitals are stable.  Vitals:   03/31/22 0820  BP: (!) 103/57  Pulse: 73  Temp: 97.8 F (36.6 C)  SpO2: 98%    Chaperone present on exam. On exam, patient is sitting upright in no acute distress.  Abdomen is soft and nontender.  There were no significant fluid collections palpated on exam.  There is no overlying erythema or ecchymosis.  Steri-Strips are in place over the incision.  Centrally to the incision, Steri-Strips were falling off.  These were removed.  The incision underneath was noted to have some superficial sloughing.  This area was approximately 8 cm x 0.25 cm x 0.25 cm.  There is no drainage or surrounding erythema.  Incision was otherwise intact with Steri-Strips.  JP drain is in place and functioning.  There is dark /purpleish bloody drainage noted in the bulb.  I discussed with the patient that she should apply Xeroform and gauze over the area  of superficial sloughing daily.  I also discussed with her she should apply ABD pads over her incision.  Patient expressed understanding.  I discussed with the patient that she may apply gauze around the drain site daily if she would like.  Patient expressed understanding.  I discussed with the patient that she should continue compression at all times and avoid strenuous activities.  I discussed with the patient that she should continue to record her drain output as she has been.  Patient expressed understanding.  I instructed the patient to continue to monitor the surgical site and her symptoms.  I discussed with the patient that if she were to develop any new pain, increased drainage, fevers, chills or if she has any concerns to give [place] a call.  Patient expressed understanding.  Patient to follow-up on Friday.  I instructed the patient to call in the meantime if she has any questions or concerns.  Pictures were obtained of the patient and placed in the chart with the patient's or guardian's permission.

## 2022-03-31 NOTE — Telephone Encounter (Signed)
Faxed wound care supply request to [facility], requesting: ABDs, 4x4 sterile gauze, and xeroform to be changed daily.  Confirmed receipt received.

## 2022-03-31 NOTE — Patient Outreach (Signed)
Care Coordination [facility] Note Transition Care Management Follow-up Telephone Call Date of discharge and from where: Friday 03/28/22; [place]: occult GI bleeding with symptomatic anemia secondary to panniculectomy with drain placement on 04/19/22 How have you been since you were released from the hospital? "I am doing real good; I saw the surgeon today and I also go back on Friday to see them again.  Yes, I would like your help scheduling the PCP office visit- I know my labs need to be re-checked; thanks for making the appointment for me.  I live by myself and am able to do everything I need to care for myself.  My daughter also helps me when I need anything, and I have a nurse that has been coming out for a long time, helping me with things around the house for a couple of hours each day.  I manage all of my own medications and have stopped taking the blood thinner medication like they told me to" Any questions or concerns? No  Items Reviewed: Did the pt receive and understand the discharge instructions provided? Yes  Medications obtained and verified? Yes  Other? No  Any new allergies since your discharge? Yes  Dietary orders reviewed? Yes Do you have support at home? Yes  patient resides alone and reports she is independent in self-care activities; daughter assists as indicated  Home Care and Equipment/Supplies: Were home health services ordered? no If so, what is the name of the agency? N/A  Has the agency set up a time to come to the patient's home? not applicable Were any new equipment or medical supplies ordered?  No What is the name of the medical supply agency? N/A Were you able to get the supplies/equipment? not applicable Do you have any questions related to the use of the equipment or supplies? No N/A  Functional Questionnaire: (I = Independent and D = Dependent) ADLs: I  Bathing/Dressing- I  Meal Prep- I  Eating- I  Maintaining continence- I  Transferring/Ambulation-  I  Managing Meds- I  Follow up appointments reviewed:  PCP Hospital f/u appt confirmed? Yes  Scheduled to see PCP on Thursday 04/03/22 @ 10:40 am-- facilitated scheduling of this appointment in real-time with scheduling care guide team [facility] f/u appt confirmed? Yes  Scheduled to see plastic surgery surgical provider on today, 03/31/22 @ 8:10 am- confirmed patient attended appointment as scheduled Are transportation arrangements needed? No  If their condition worsens, is the pt aware to call PCP or go to the Emergency Dept.? Yes Was the patient provided with contact information for the PCP's office or ED? No- patient declines; reports already has contact information for all care providers Was to pt encouraged to call back with questions or concerns? Yes  SDOH assessments and interventions completed:   Yes SDOH Interventions Today    Flowsheet Row Most Recent Value  SDOH Interventions   Food Insecurity Interventions Intervention Not Indicated  Transportation Interventions Intervention Not Indicated  [drives self]      Interventions Today    Flowsheet Row Most Recent Value  General Interventions   General Interventions Discussed/Reviewed General Interventions Discussed, Doctor Visits  Doctor Visits Discussed/Reviewed Doctor Visits Discussed, PCP, Specialist  [Facilitated scheduling of HFU PCP OV on 04/03/22 in real-time with scheduling care guide team]  PCP/Specialist Visits Compliance with follow-up visit  [reviewed with patient specialist appointment from earlier today]  Nutrition Interventions   Nutrition Discussed/Reviewed Nutrition Discussed  Pharmacy Interventions   Pharmacy Dicussed/Reviewed Pharmacy Topics Discussed  [  confirmed patient has made recommended changes to medications post-hospital discharge,  she confirms she self-manages medications and denies medication concerns today,  declines full medication review]       Care Coordination Interventions:  PCP follow  up appointment requested Interventions as above; provided education around need for lab work within one week post-hospital discharge    Encounter Outcome:  Pt. Visit Completed    Oneta Rack, RN, BSN, CCRN Alumnus RN CM Care Coordination/ Transition of [place] Management [phone]: direct office

## 2022-03-31 NOTE — Progress Notes (Signed)
  Care Coordination  Note  03/31/2022 Name: Anushri Casalino MRN: [id number] DOB: 14-Apr-1960  Bascom Levels Vultaggio is a 62 y.o. year old primary care patient of Raenette Rover, Vickie L, NP-C.   Follow up plan: Hospital Follow Up appointment scheduled with [name], NP-C on 04/03/22 at 10:40 AM patient made aware of appointment by [facility].  [place]  Direct Dial: [phone]

## 2022-04-02 DIAGNOSIS — Z5181 Encounter for therapeutic drug level monitoring: Secondary | ICD-10-CM | POA: Diagnosis not present

## 2022-04-02 DIAGNOSIS — M1712 Unilateral primary osteoarthritis, left knee: Secondary | ICD-10-CM | POA: Diagnosis not present

## 2022-04-02 DIAGNOSIS — G894 Chronic pain syndrome: Secondary | ICD-10-CM | POA: Diagnosis not present

## 2022-04-03 ENCOUNTER — Inpatient Hospital Stay: Payer: Medicaid Other | Admitting: Family Medicine

## 2022-04-04 ENCOUNTER — Telehealth: Payer: Self-pay

## 2022-04-04 ENCOUNTER — Ambulatory Visit (INDEPENDENT_AMBULATORY_CARE_PROVIDER_SITE_OTHER): Payer: Medicaid Other | Admitting: Physician Assistant

## 2022-04-04 ENCOUNTER — Encounter: Payer: Self-pay | Admitting: Physician Assistant

## 2022-04-04 VITALS — BP 112/72 | HR 99

## 2022-04-04 DIAGNOSIS — M793 Panniculitis, unspecified: Secondary | ICD-10-CM

## 2022-04-04 NOTE — Telephone Encounter (Signed)
Received fax from [facility] stating: "[facility] has provided service for the patient; no further action is required."

## 2022-04-04 NOTE — Progress Notes (Signed)
This is a 62 year old female who is status post panniculectomy on 03/19/2022 with Dr. Marla Roe.  She was subsequently admitted to [facility] on 03/22/2022 with chief complaint of constipation.  She was noted to be anemic at that time with ongoing drain output.  She received 2 units of packed red blood cells.  Her hemoglobin stabilized, she improved and was subsequently discharged on 03/28/2022.    She was most recently seen in the office on 03/31/2022.  At that time she had continued drain output but was feeling well with no significant complaints.  Since her last office visit she denies any complaints or concerns.  She denies any infectious signs or symptoms.  She denies any dizziness, lightheadedness, she notes she is up and ambulating frequently without difficulty.  She notes continued ongoing drain output which has been fluctuating.  She notes yesterday she was very active and put out 575 cc over 24-hour period, she notes that the drainage has slowed down today and since midnight has put out 250 cc.  She notes that it is a dark color.  Chaperone present.  On exam lower abdominal incision is clean and dry, she has several superficial wounds along the anterior incision line.  There is no surrounding redness discharge or warmth.  Abdomen soft with no palpable fluid collections or area of firmness.  JP drain with a dark bloody output.  Overall the patient is doing well.  She continues to have fluctuating drain outputs, today less than yesterday.  Upon my evaluation the output does appear to have a serous component mixed with some older blood.  She is tolerating this very well, she has no concerning signs or symptoms at today's visit.  Would like to continue following her closely, would like to see her back in the office next week.  She has a scheduled follow-up on Tuesday of next week already.  She understands that if she has any significant increase in drain output or has any concerning signs or  symptoms to reach out to our office immediately.  She verbalized understanding and agreement to today's plan had no further questions or concerns.

## 2022-04-08 ENCOUNTER — Ambulatory Visit (INDEPENDENT_AMBULATORY_CARE_PROVIDER_SITE_OTHER): Payer: Medicare HMO | Admitting: Plastic Surgery

## 2022-04-08 ENCOUNTER — Encounter: Payer: Self-pay | Admitting: Plastic Surgery

## 2022-04-08 VITALS — BP 106/70 | HR 131

## 2022-04-08 DIAGNOSIS — M793 Panniculitis, unspecified: Secondary | ICD-10-CM

## 2022-04-08 NOTE — Progress Notes (Signed)
   Subjective:    Patient ID: Carla Sloan, female    DOB: 12/12/1960, 62 y.o.   MRN: [id number]  The patient is a 62 year old female here for follow-up on her panniculectomy from January 24.  She had over 3000 g removed.  She had an episode of constipation which brought her to the emergency room and she was found to have blood pressure issues.  She got blood transfusion as well.  Overall she is doing really well.  Her serosanguineous output from the drain is approximately 150 a day.  We are going to let that stay in place for 1 more week.  I really think we will need to get that out next week.  He has a little bit of opening of the skin as expected and she has been using the Medihoney on that at home.    Review of Systems  Constitutional:  Positive for activity change.  Eyes: Negative.   Respiratory: Negative.    Cardiovascular: Negative.   Gastrointestinal: Negative.   Endocrine: Negative.   Genitourinary: Negative.        Objective:   Physical Exam Constitutional:      Appearance: Normal appearance.  Cardiovascular:     Rate and Rhythm: Normal rate.     Pulses: Normal pulses.  Pulmonary:     Effort: Pulmonary effort is normal.  Abdominal:     General: There is no distension.     Palpations: Abdomen is soft. There is no mass.     Tenderness: There is abdominal tenderness.  Neurological:     Mental Status: She is alert and oriented to person, place, and time.  Psychiatric:        Mood and Affect: Mood normal.        Behavior: Behavior normal.        Judgment: Judgment normal.        Assessment & Plan:     ICD-10-CM   1. Panniculitis  M79.3         I am and have her add the Vashe wash and continue with the Medihoney and we will see her back next week.  She is in agreement I also like her to get a spanks because the abdominal binder is not really fitting her well.

## 2022-04-09 ENCOUNTER — Ambulatory Visit (INDEPENDENT_AMBULATORY_CARE_PROVIDER_SITE_OTHER): Payer: Medicare HMO | Admitting: Physician Assistant

## 2022-04-09 ENCOUNTER — Inpatient Hospital Stay: Payer: Medicaid Other | Admitting: Family Medicine

## 2022-04-09 ENCOUNTER — Telehealth: Payer: Self-pay

## 2022-04-09 ENCOUNTER — Encounter: Payer: Self-pay | Admitting: Physician Assistant

## 2022-04-09 VITALS — BP 118/78 | HR 135

## 2022-04-09 DIAGNOSIS — M793 Panniculitis, unspecified: Secondary | ICD-10-CM

## 2022-04-09 NOTE — Telephone Encounter (Signed)
Faxed wound supply order for Vashe to [facility] with demographics, insurance card and most recent ov note attached.   Received correspondence from [facility] on 2/14 that were some issues delaying service to the pt due to her insurance plan not covering the Vashe under her benefits. They stated that they will contact the pt with pricing options.   Forwarding order and [facility] correspondence to front desk x batch scanning.

## 2022-04-09 NOTE — Progress Notes (Signed)
This is a 62 year old female who is status post panniculectomy on March 19, 2022 by Dr. Marla Roe.  She was seen in the office yesterday and was doing well, her drains are putting out approximately 150 cc/day.  She notes that this morning when she was emptying her drain the drain dislodged.  She presented to our office for evaluation.  She notes no significant pain, she denies any abdominal swelling, no fever or chills.  She does note some drainage at the drain site and along the left lateral incision.  Chaperone present.  Abdomen is soft nontender with no palpable fluid collections.  The drain site is clean with some serous drainage, there is also some serous drainage along the left lateral incision line.  No surrounding redness warmth or tenderness.  Unfortunately the patient's drain was dislodged.  Yesterday she was putting out approximately 150 cc/day.  I would like to continue following the patient closely, she will apply Vaseline and gauze over her incision and drain site.  She will change her dressings frequently.  I will see her back in the office in 2 days on Friday the 16th 2024.  She understands to reach out to [place] immediately if she develops any new or worsening signs in the meantime.  She verbalized understanding and agreement to today's plan had no further questions or concerns.

## 2022-04-10 ENCOUNTER — Ambulatory Visit: Payer: Medicare HMO | Admitting: Skilled Nursing Facility1

## 2022-04-11 ENCOUNTER — Other Ambulatory Visit: Payer: Self-pay

## 2022-04-11 ENCOUNTER — Encounter (HOSPITAL_COMMUNITY): Payer: Self-pay

## 2022-04-11 ENCOUNTER — Emergency Department (HOSPITAL_COMMUNITY)
Admission: EM | Admit: 2022-04-11 | Discharge: 2022-04-11 | Disposition: A | Discharge status: Home / Self Care | Payer: Medicare HMO | Attending: Emergency Medicine | Admitting: Emergency Medicine

## 2022-04-11 ENCOUNTER — Encounter: Payer: Self-pay | Admitting: Physician Assistant

## 2022-04-11 ENCOUNTER — Ambulatory Visit (INDEPENDENT_AMBULATORY_CARE_PROVIDER_SITE_OTHER): Payer: Medicare HMO | Admitting: Physician Assistant

## 2022-04-11 ENCOUNTER — Emergency Department (HOSPITAL_COMMUNITY): Payer: Medicare HMO

## 2022-04-11 VITALS — BP 115/77 | HR 153

## 2022-04-11 DIAGNOSIS — I10 Essential (primary) hypertension: Secondary | ICD-10-CM | POA: Diagnosis not present

## 2022-04-11 DIAGNOSIS — R918 Other nonspecific abnormal finding of lung field: Secondary | ICD-10-CM | POA: Diagnosis not present

## 2022-04-11 DIAGNOSIS — Z9889 Other specified postprocedural states: Secondary | ICD-10-CM | POA: Diagnosis not present

## 2022-04-11 DIAGNOSIS — M793 Panniculitis, unspecified: Secondary | ICD-10-CM

## 2022-04-11 DIAGNOSIS — R000 Tachycardia, unspecified: Secondary | ICD-10-CM | POA: Insufficient documentation

## 2022-04-11 DIAGNOSIS — D649 Anemia, unspecified: Secondary | ICD-10-CM | POA: Diagnosis not present

## 2022-04-11 LAB — COMPREHENSIVE METABOLIC PANEL
ALT: 14 U/L (ref 0–44)
AST: 23 U/L (ref 15–41)
Albumin: 2.4 g/dL — ABNORMAL LOW (ref 3.5–5.0)
Alkaline Phosphatase: 80 U/L (ref 38–126)
Anion gap: 12 (ref 5–15)
BUN: 11 mg/dL (ref 6–20)
CO2: 25 mmol/L (ref 22–32)
Calcium: 9.1 mg/dL (ref 8.9–10.3)
Chloride: 101 mmol/L (ref 98–111)
Creatinine, Ser: 0.66 mg/dL (ref 0.44–1.00)
GFR, Estimated: >60 mL/min (ref >60)
Glucose, Bld: 99 mg/dL (ref 70–99)
Potassium: 4.2 mmol/L (ref 3.5–5.1)
Sodium: 138 mmol/L (ref 135–145)
Total Bilirubin: 0.2 mg/dL — ABNORMAL LOW (ref 0.3–1.2)
Total Protein: 6.4 g/dL — ABNORMAL LOW (ref 6.5–8.1)

## 2022-04-11 LAB — CBC
HCT: 31.2 % — ABNORMAL LOW (ref 36.0–46.0)
Hemoglobin: 9.4 g/dL — ABNORMAL LOW (ref 12.0–15.0)
MCH: 29.4 pg (ref 26.0–34.0)
MCHC: 30.1 g/dL (ref 30.0–36.0)
MCV: 97.5 fL (ref 80.0–100.0)
Platelets: 611 10*3/uL — ABNORMAL HIGH (ref 150–400)
RBC: 3.2 MIL/uL — ABNORMAL LOW (ref 3.87–5.11)
RDW: 14 % (ref 11.5–15.5)
WBC: 6.8 10*3/uL (ref 4.0–10.5)
nRBC: 0 % (ref 0.0–0.2)

## 2022-04-11 LAB — TSH: TSH: 1.59 u[IU]/mL (ref 0.350–4.500)

## 2022-04-11 MED ORDER — SODIUM CHLORIDE 0.9 % IV BOLUS
1000.0000 mL | Freq: Once | INTRAVENOUS | Status: AC
  Administered 2022-04-11: 1000 mL via INTRAVENOUS

## 2022-04-11 MED ORDER — IOHEXOL 350 MG/ML SOLN
75.0000 mL | Freq: Once | INTRAVENOUS | Status: AC | PRN
  Administered 2022-04-11: 75 mL via INTRAVENOUS

## 2022-04-11 NOTE — Progress Notes (Signed)
  This is a 62 year old female who is status post panniculectomy on March 19, 2022 by Dr. Marla Roe.  She was last seen in the office on 04/09/2022.  At that time her drain had been accidentally pulled out, the day prior she had put out 150 cc.  Since her last office visit she notes she has been doing well, she denies any significant complaints or issues.  She notes that the left lateral incision has continued to have minimal serous drainage, she denies any fever, abdominal pain, abdominal swelling, dizziness, lightheadedness, chest pain, shortness of breath, lower extremity swelling or edema.  Chaperone present.  On exam abdomen soft nontender with no palpable fluid collections.  The drain site and lateral incisions are clean with some minimal serous drainage, no purulence no surrounding redness, no induration.  Calves are soft nontender with no swelling.  Overall the patient reports she is doing well.  She has no abdominal fullness, swelling that would indicate ongoing fluid collection.  She has no signs of infectious etiology on exam.  Her heart rate was significantly elevated in the 150s today, she was also tachycardic at her last office visit.  Her heart rate is regular.  She has no signs of infectious etiology that would indicate a systemic illness.  She did attempt to follow-up with her primary care provider after her last visit but they were out of town.  Given her high Caprini score and tachycardia I would recommend she be seen in the emergency room for evaluation and workup for the tachycardia including PE.  She has no lower extremity swelling or edema, she denies any chest pain or shortness of breath, she is not dizzy with standing.  She has not had any significant fluid losses and has been eating and drinking appropriately.  The patient notes that she will go from our office over to the emergency room for evaluation.  She understands to reach out to [place] with any further questions or concerns she  may have.

## 2022-04-11 NOTE — ED Triage Notes (Signed)
Reports felt like her heart has been racing x 4 days.  Went to MD today and HR was high so sent here.  Patient denies cp sob or being sick.

## 2022-04-11 NOTE — ED Notes (Signed)
Discharge instructions reviewed with patient. Patient denies any questions or concerns at this time. Patient verbalized understanding of need for follow-up care. Patient out to lobby via wheelchair.

## 2022-04-11 NOTE — ED Provider Notes (Signed)
[facility] Provider Note   CSN: [id number] Arrival date & time: 04/11/22  [id number]     History  Chief Complaint  Patient presents with   Tachycardia    Carla Sloan is a 62 y.o. female.  HPI Patient presents with tachycardia.  Presents with plastic surgery office.  Has felt her heart going fast.  No fevers.  Did have panniculectomy around 3 weeks ago.  Had required transfusion for anemia.  States no more bleeding and drains are out.  No chest pain or shortness of breath.  Does feel her heart going a little fast has been that for the last few days.  No lightheadedness or dizziness.  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   Past Medical History:  Diagnosis Date   Arthritis    Depression    Hypertension    Muscle spasms of lower extremity    and back   Past Surgical History:  Procedure Laterality Date   ABDOMINOPLASTY/PANNICULECTOMY WITH LIPOSUCTION N/A 03/19/2022   Procedure: PANNICULECTOMY WITH LIPOSUCTION;  Surgeon: Wallace Going, DO;  Location: [facility];  Service: Plastics;  Laterality: N/A;   COLONOSCOPY WITH PROPOFOL N/A 09/24/2012   Procedure: COLONOSCOPY WITH PROPOFOL;  Surgeon: Jeryl Columbia, MD;  Location: [facility] ENDOSCOPY;  Service: Endoscopy;  Laterality: N/A;   COLONOSCOPY WITH PROPOFOL N/A 05/19/2016   Procedure: COLONOSCOPY WITH PROPOFOL;  Surgeon: Clarene Essex, MD;  Location[facility] ENDOSCOPY;  Service: Endoscopy;  Laterality: N/A;   LAPAROSCOPIC GASTRIC SLEEVE RESECTION N/A 04/24/2020   Procedure: LAPAROSCOPIC GASTRIC SLEEVE RESECTION;  Surgeon: Clovis Riley, MD;  Location: [facility] ORS;  Service: General;  Laterality: N/A;   NO PAST SURGERIES     TUBAL LIGATION     UPPER GI ENDOSCOPY N/A 04/24/2020   Procedure: UPPER GI ENDOSCOPY;  Surgeon: Clovis Riley, MD;  Location: [facility] OR[facility];  Service: General;  Laterality: N/A;     Home Medications Prior to Admission medications   Medication Sig Start Date  End Date Taking? Authorizing Provider  acetaminophen (TYLENOL) 500 MG tablet Take 1 tablet (500 mg total) by mouth every 6 (six) hours as needed. Patient taking differently: Take 500 mg by mouth every 6 (six) hours as needed for mild pain, fever or headache. 05/11/20   Hughie Closs, PA-C  azelastine (OPTIVAR) 0.05 % ophthalmic solution Place 1 drop into both eyes 2 (two) times daily.    [provider]  bimatoprost (LUMIGAN) 0.03 % ophthalmic solution Place 1 drop into both eyes at bedtime.    [provider]  brimonidine-timolol (COMBIGAN) 0.2-0.5 % ophthalmic solution Place 1 drop into both eyes every 12 (twelve) hours.    [provider]  calcium citrate-vitamin D (CELEBRATE CALCIUM CITRATE) 500-500 MG-UNIT chewable tablet Chew 1 tablet by mouth every evening.    [provider]  CEQUA 0.09 % SOLN Place 1 drop into both eyes 2 (two) times daily. 11/17/19   [provider]  citalopram (CELEXA) 40 MG tablet Take 40 mg by mouth daily.    [provider]  cyclobenzaprine (FLEXERIL) 10 MG tablet Take 10 mg by mouth 3 (three) times daily. 11/12/21   [provider]  diclofenac Sodium (VOLTAREN) 1 % GEL Apply 4 g topically 4 (four) times daily. 08/02/20   Hazel Sams, PA-C  Elastic Bandages & Supports (WRIST SPLINT/COCK-UP/LEFT L) MISC Wear nightly and with exacerbating activities. 10/24/19   Patriciaann Clan, DO  Elastic Bandages &  Supports (WRIST SPLINT/COCK-UP/RIGHT L) MISC Wear nightly and with exacerbating activities. 10/24/19   Patriciaann Clan, DO  ergocalciferol (VITAMIN D2) 1.25 MG (50000 UT) capsule Take 50,000 Units by mouth once a week.    [provider]  ferrous sulfate 324 MG TBEC Take 324 mg by mouth daily with breakfast.    [provider]  lidocaine (LIDODERM) 5 % Place 1 patch onto the skin daily. Remove & Discard patch within 12 hours or as directed by MD 07/17/21   Raspet, Derry Skill, PA-C  Lurasidone  HCl (LATUDA) 60 MG TABS Take 60 mg by mouth at bedtime.    [provider]  Multiple Vitamins-Minerals (CELEBRATE MULTI-COMPLETE 44) CHEW Chew 1 each by mouth daily.    [provider]  ondansetron (ZOFRAN) 4 MG tablet Take 1 tablet (4 mg total) by mouth every 8 (eight) hours as needed for nausea or vomiting. 03/07/22   Scheeler, Carola Rhine, PA-C  orphenadrine (NORFLEX) 100 MG tablet Take 1 tablet (100 mg total) by mouth 2 (two) times daily. [id number]   Delora Fuel, MD  oxyCODONE-acetaminophen (PERCOCET) 10-325 MG tablet Take 1 tablet by mouth twice a day as needed. Patient taking differently: Take 1 tablet by mouth 2 (two) times daily as needed for pain. 08/05/21     pravastatin (PRAVACHOL) 20 MG tablet Take 1 tablet (20 mg total) by mouth daily. 01/10/22   Henson, Laurian Brim, NP-C      Allergies    Patient has no known allergies.    Review of Systems   Review of Systems  Physical Exam Updated Vital Signs BP 112/69   Pulse (!) 126   Temp 98.4 F (36.9 C) (Oral)   Resp 20   Ht 5' 5"$  (1.651 m)   Wt 119.3 kg   LMP 09/25/2015 (Approximate)   SpO2 98%   BMI 43.77 kg/m  Physical Exam Vitals and nursing note reviewed.  Eyes:     Pupils: Pupils are equal, round, and reactive to light.  Cardiovascular:     Rate and Rhythm: Regular rhythm. Tachycardia present.  Pulmonary:     Breath sounds: No wheezing.  Chest:     Chest wall: No tenderness.  Skin:    General: Skin is warm.     Coloration: Skin is pale.  Neurological:     Mental Status: She is oriented to person, place, and time.     ED Results / Procedures / Treatments   Labs (all labs ordered are listed, but only abnormal results are displayed) Labs Reviewed  COMPREHENSIVE METABOLIC PANEL - Abnormal; Notable for the following components:      Result Value   Total Protein 6.4 (*)    Albumin 2.4 (*)    Total Bilirubin 0.2 (*)    All other components within normal limits  CBC - Abnormal; Notable for the  following components:   RBC 3.20 (*)    Hemoglobin 9.4 (*)    HCT 31.2 (*)    Platelets 611 (*)    All other components within normal limits  TSH    EKG EKG Interpretation  Date/Time:  Friday April 11 2022 08:53:57 EST Ventricular Rate:  153 PR Interval:  128 QRS Duration: 100 QT Interval:  256 QTC Calculation: 408 R Axis:   40 Text Interpretation: Sinus tachycardia Left ventricular hypertrophy with repolarization abnormality ( [place]hilt-Estes ) Cannot rule out Inferior infarct , age undetermined Abnormal ECG When compared with ECG of 22-Mar-2022 16:36, rate increased Confirmed by Davonna Belling (  [id number]) on 04/11/2022 8:58:38 AM  Radiology CT Angio Chest PE W and/or Wo Contrast  Result Date: 04/11/2022 CLINICAL DATA:  Felt like heart racing for 4 days. Elevated heart rate at doctor's office. EXAM: CT ANGIOGRAPHY CHEST WITH CONTRAST TECHNIQUE: Multidetector CT imaging of the chest was performed using the standard protocol during bolus administration of intravenous contrast. Multiplanar CT image reconstructions and MIPs were obtained to evaluate the vascular anatomy. RADIATION DOSE REDUCTION: This exam was performed according to the departmental dose-optimization program which includes automated exposure control, adjustment of the mA and/or kV according to patient size and/or use of iterative reconstruction technique. CONTRAST:  58m OMNIPAQUE IOHEXOL 350 MG/ML SOLN COMPARISON:  Same-day chest radiograph FINDINGS: Cardiovascular: There is adequate opacification of the pulmonary arteries to the segmental level. There is no evidence of pulmonary embolism. The heart size is normal. There is no pericardial effusion. The thoracic aorta is unremarkable. Mediastinum/Nodes: The thyroid is unremarkable. The esophagus is grossly unremarkable. There is no mediastinal, hilar, or axillary lymphadenopathy. Lungs/Pleura: The trachea and central airways are patent. There is marked asymmetric elevation of the  left hemidiaphragm, unchanged going back multiple prior radiographs. There is no focal consolidation or pulmonary edema. There is no pleural effusion or pneumothorax. There are no suspicious nodules. Upper Abdomen: Postsurgical changes are noted in the stomach. Musculoskeletal: There is no acute osseous abnormality or suspicious osseous lesion. Review of the MIP images confirms the above findings. IMPRESSION: No evidence of pulmonary embolism or other acute cardiopulmonary pathology. Electronically Signed   By: PValetta MoleM.D.   On: 04/11/2022 11:40   DG Chest Portable 1 View  Result Date: 04/11/2022 CLINICAL DATA:  Tachycardia EXAM: PORTABLE CHEST 1 VIEW COMPARISON:  07/05/2021 FINDINGS: Eventration of the left hemidiaphragm. Lungs are clear. No pleural effusion or pneumothorax. The heart is normal in size. IMPRESSION: No evidence of acute cardiopulmonary disease. Electronically Signed   By: SJulian HyM.D.   On: 04/11/2022 09:39    Procedures Procedures    Medications Ordered in ED Medications  sodium chloride 0.9 % bolus 1,000 mL (0 mLs Intravenous Stopped 04/11/22 1159)  iohexol (OMNIPAQUE) 350 MG/ML injection 75 mL (75 mLs Intravenous Contrast Given 04/11/22 1127)    ED Course/ Medical Decision Making/ A&P                             Medical Decision Making Amount and/or Complexity of Data Reviewed Labs: ordered. Radiology: ordered.  Risk Prescription drug management.   Patient with persistent tachycardia.  Appears to be sinus.  Does have recent blood loss anemia.  However hemoglobin appears stable today.  With previous DVT the risk of that is the cause of tachycardia is high.  I think we need a CT scan to further evaluate.  Will also give small fluid bolus. Heart rate improved from.  Hemoglobin mildly decreased but not to the point I think would cause severe tachycardia.  Does not appear anxious and vitals otherwise reassuring.  Can hydrate more orally.  CT scan does not  show pulmonary embolism.  Will do follow-up with cardiology for the persistent tachycardia.        Final Clinical Impression(s) / ED Diagnoses Final diagnoses:  Tachycardia    Rx / DC Orders ED Discharge Orders          Ordered    Ambulatory referral to Cardiology       Comments: If you have not heard from the  Cardiology office within the next 72 hours please call [phone].   04/11/22 1224              Davonna Belling, MD 04/11/22 [phone]

## 2022-04-15 ENCOUNTER — Encounter: Payer: Self-pay | Admitting: Surgical

## 2022-04-15 ENCOUNTER — Ambulatory Visit (INDEPENDENT_AMBULATORY_CARE_PROVIDER_SITE_OTHER): Payer: Medicare HMO | Admitting: Surgical

## 2022-04-15 VITALS — BP 123/66 | HR 133

## 2022-04-15 DIAGNOSIS — M793 Panniculitis, unspecified: Secondary | ICD-10-CM

## 2022-04-15 NOTE — Progress Notes (Signed)
Patient is a very pleasant 62 year old female here for follow-up after infraumbilical panniculectomy with Dr. Marla Roe on March 19, 2022 with Dr. Marla Roe.  She was last seen in the office on 04/11/2022, she had an elevated heart rate at that time and was sent to the emergency room for concerns over PE given her history of thrombosis in the past.  She had a CT chest which was negative.  She has a follow-up/consultation scheduled with cardiology on May 06, 2022.  She reports that her heart rate has still been elevated but she denies any vision changes, headaches, chest pain, weakness or fatigue.  She does report that she gets slightly short of breath when active, but has no pain at rest.  Patient reports that she has noticed that she had a significant amount of drainage from her midline abdomen on 04/11/2022 while in the emergency room.  She reports that it has continued to drain since then  Chaperone present on exam BP 123/66 (BP Location: Left Arm, Patient Position: Sitting, Cuff Size: Large)   Pulse (!) 133   LMP 09/25/2015 (Approximate)   SpO2 100%   On exam patient is well-developed, well-nourished, no acute distress, sitting up in patient chair. Breathing is unlabored  On exam of her abdomen, she has an opening of the midline abdomen just inferior to the umbilicus that is approximately 19 x 2.5 x 2 cm.  There is no surrounding cellulitic changes, however she does have some hyperpigmentation extending superiorly and inferior of the wound, I do not appreciate any cellulitic changes, but she does have some mild skin peeling from the wound edges.  There is no erythema noted at this time.  There is some cloudy drainage noted with palpation, but no purulence is noted.  She also has an opening of the left lateral incision that is approximately 7 x 1 x 1 cm, no surrounding cellulitic changes or active drainage in this area.  Left JP drain insertion site wound present, there is no surrounding  erythema or cellulitic changes.  She does have some slough in this area.  No signs of infection.   A/P:  Patient with midline and left lateral abdominal wounds after infraumbilical panniculectomy.  I suspect that the midline abdominal wound had a spontaneous seroma drainage a few days ago per patient's history.  I do not appreciate any signs of active infection, however she does have some hyperpigmentation and skin peeling around the wound edges.  I encouraged her to continue to monitor this area for increased erythema or pain.  We discussed options for antibiotics, however given that it is draining and there is no signs of active infection will hold off at this time.  In regards to her abdominal wound, recommend packing the midline and left lateral abdominal wound with wet-to-dry dressings with saline or vashe soaked gauze.  Provided patient with instructions on how to care for this today, she reports her daughter will be able to help her.  Discussed patient's case with Dr. Marla Roe who agrees with plan, pictures were taken and placed in the patient's chart with patient's permission.  She is scheduled to see cardiology in a few weeks for initial consultation after emergency room visit for tachycardia.  Encourage patient to call if her symptoms change or worsen, we will plan to see her back in 1 week for reevaluation.

## 2022-04-16 ENCOUNTER — Encounter: Payer: Medicare HMO | Attending: Surgery | Admitting: Skilled Nursing Facility1

## 2022-04-16 ENCOUNTER — Encounter: Payer: Self-pay | Admitting: Skilled Nursing Facility1

## 2022-04-16 ENCOUNTER — Telehealth: Payer: Self-pay

## 2022-04-16 VITALS — Ht 65.0 in | Wt 267.2 lb

## 2022-04-16 DIAGNOSIS — Z713 Dietary counseling and surveillance: Secondary | ICD-10-CM | POA: Insufficient documentation

## 2022-04-16 DIAGNOSIS — Z9884 Bariatric surgery status: Secondary | ICD-10-CM | POA: Insufficient documentation

## 2022-04-16 DIAGNOSIS — E669 Obesity, unspecified: Secondary | ICD-10-CM | POA: Diagnosis present

## 2022-04-16 NOTE — Progress Notes (Signed)
Follow-up visit:  Post-Operative sleeve Surgery   Anthropometrics  Surgery date: 04/24/2020 Surgery type: sleeve Start weight at [facility]: 411.3 pounds Weight today: 267.2 pounds   Body Composition Scale 06/19/2020 12/03/2020 03/05/2021 05/06/2021 12/02/2021 04/16/2022  Current Body Weight 336.2 271.3 257.7 255.1 258.5 267.1  Total Body Fat % 51.6 47.5 46.4 46.1 48 47.8  Visceral Fat 26 19 18 18 19 20  $ Fat-Free Mass % 48.3 52.4 53.5 53.8 51.9 52.1   Total Body Water % 38.6 40.7 41.2 41.4 40.4 40.5  Muscle-Mass lbs 32.3 31.5 31.4 31.4 29.3 30.7  BMI 54.3 43.8 41.5 41.1 42.7 44.1  Body Fat Displacement               Torso  lbs 107.8 80 74.1 73 77 79.2         Left Leg  lbs 21.5 16 14.8 14.6 15.4 15.8         Right Leg  lbs 21.5 16 14.8 14.6 15.4 15.8         Left Arm  lbs 10.7 8 7.4 7.3 7.7 7.9         Right Arm   lbs 10.7 8 7.4 7.3 7.7 7.9    Pt was out of breath walking back to the office which is unusual for her but then was breathing normally after sitting about 3-5 minutes with no edema in lower legs or complaints of typical blood clot symptoms due to tachycardia recently advsied pt to let her dcotor know of the SOB in abundance of caution.   Pt states she has had a heart rate of 155 and is short of breath stating she has an upcoming appt for that issue.  Pt states she got 7 pounds worth of skin removed.  Pt states she eats out of the same bowl she has been eating out.  Pt states she has not been as active as she due to the surgery made her HR.    24 hr recall: Breakfast: premier protein shake + lemon and ginger juice  Snack 11:30: beef broth Lunch 1pm: protein drink Snack: 10-15 grapes or 1 tangerine or 1 grapefruit  Dinner: mashed potato, [place] and greens  Fluid intake: water 60-80 fluid ounces, sugar free jello, sometimes unsweet tea, sometimes orange juice, cranberry juice, water + flavorings, soda   Medications: See List Supplementation: bari multi: celebrate chewable  and calcium     Using straws: no Drinking while eating: no Having you been chewing well: yes Chewing/swallowing difficulties: no Changes in vision: no Changes to mood/headaches: no Hair loss/Cahnges to skin/Changes to nails: no Any difficulty focusing or concentrating: no Sweating: no Dizziness/Lightheaded: no Palpitations: no  Carbonated beverages: no N/V/D/C/GAS: no Abdominal Pain: no Dumping syndrome: no  Recent physical activity: ADL's due to HR and surgery   Progress Towards Goal(s):  In Progress Teaching method utilized: Environmental health practitioner & Auditory  Demonstrated degree of understanding via: Teach Back  Readiness Level: Action Barriers to learning/adherence to lifestyle change: none identified   Goals: Get back to drinking water and stop drinking soda   Teaching Method Utilized:  Visual Auditory Hands on  Demonstrated degree of understanding via:  Teach Back   Monitoring/Evaluation:  Dietary intake, exercise, and body weight.

## 2022-04-16 NOTE — Telephone Encounter (Signed)
Faxed wound supply order to [facility] with demographics, insurance card and most recent ov note attached. Received fax success confirmation. Will forward to front desk after correspondence is received from [facility].

## 2022-04-16 NOTE — Telephone Encounter (Signed)
     Patient  visit on 2/16  at [facility]    Have you been able to follow up with your primary care physician? Yes   The patient was or was not able to obtain any needed medicine or equipment. Yes   Are there diet recommendations that you are having difficulty following? Na   Patient expresses understanding of discharge instructions and education provided has no other needs at this time.  Yes      [facility] [phone] [address], [place], [place] [address] Phone: [phone] Email: [email or web address]

## 2022-04-17 DIAGNOSIS — F333 Major depressive disorder, recurrent, severe with psychotic symptoms: Secondary | ICD-10-CM | POA: Diagnosis not present

## 2022-04-22 ENCOUNTER — Ambulatory Visit (INDEPENDENT_AMBULATORY_CARE_PROVIDER_SITE_OTHER): Payer: Medicare HMO | Admitting: Surgical

## 2022-04-22 DIAGNOSIS — M793 Panniculitis, unspecified: Secondary | ICD-10-CM

## 2022-04-22 NOTE — Progress Notes (Signed)
Patient is a 62 year old female here for follow-up after infraumbilical panniculectomy Dr. Marla Roe on 03/19/2022.  She reports she has been doing wet-to-dry packing of her midline and left abdominal wound with Vashe/hypochlorous acid soaked gauze daily.  She reports that her daughter is helping her with this and she feels as if it is going well.  She reports she is still having tachycardia, but is asymptomatic and is going to see cardiology in a few weeks.  She is also on a wait list to hopefully be seen sooner.  Chaperone present on exam On exam patient is well-developed, well-nourished, no acute distress, sitting up in patient chair, breathing is unlabored.  On exam of her abdomen she has an opening of the midline abdomen just inferior to the umbilicus, the central portion of the wound has fused with the lower abdominal wound and she now has 2 wounds on each side of this.  She does have some active cloudy drainage from this area, but no foul odor, no cellulitic changes.  There is no tenderness with palpation.  There is hyperpigmentation surrounding the wound.  There is some fibrinous exudate noted.  Left lateral incisional wound noted, approximately 3-1/2 to 4 cm x 1 x 1 cm.  No surrounding erythema or cellulitic changes.  No active drainage noted.  Left JP drain insertion site wound is present, healing well.  No surrounding erythema or signs of infection.  A/P:  Patient with midline and left lateral abdominal wounds after infraumbilical panniculectomy, she has been doing wet-to-dry dressings with Vashe/hypochlorous acid soaked gauze daily, this is done quite well for her and she has had some improvement in both wound bed.  I do not appreciate any cellulitic changes, overt signs of infection at this time.  Discussed case with Dr. Marla Roe, in agreement with current plan, patient may need to return to operating room in a few weeks for debridement and primary closure of the abdominal wounds,  however she is doing very well with wet-to-dry dressing changes so surgical intervention may not be necessary.  Will reevaluate next week.  Pictures were obtained of the patient and placed in the chart with the patient's or guardian's permission.

## 2022-04-24 ENCOUNTER — Inpatient Hospital Stay: Payer: Medicaid Other | Admitting: Family Medicine

## 2022-04-29 ENCOUNTER — Ambulatory Visit (INDEPENDENT_AMBULATORY_CARE_PROVIDER_SITE_OTHER): Payer: Medicare HMO | Admitting: Surgical

## 2022-04-29 DIAGNOSIS — M793 Panniculitis, unspecified: Secondary | ICD-10-CM

## 2022-04-29 NOTE — Progress Notes (Signed)
Patient is a very pleasant 62 year old female here for follow-up after infraumbilical panniculectomy with Dr. Marla Roe on 03/19/2022.  She has been doing wet-to-dry packing of her midline left abdominal wound with Vashe/hypochlorous acid soaked gauze daily, she reports that this has been going very well, her daughter has been helping her with this.  She has noticed a significant improvement in the left midline wound.  She denies any infectious symptoms or any other changes to her health since her last appointment.  She is planning to see cardiology tomorrow for initial consultation related to tachycardia.  She is asymptomatic.  On exam midline abdominal wound with significant amount of new granulation tissue is noted.  There is some fibrinous exudate noted within the wound bed.  She has some mild drainage from the right midline abdominal wound, it appears to be serosanguineous.  I do not appreciate any purulence.  It does have a mildly foul odor, no crepitus is noted of her abdomen with palpation.  She has no tenderness or pain with palpation.  Left lateral abdominal wound with good base of granulation tissue noted, no surrounding erythema, there is some surrounding hyperpigmentation noted.  A/P:  Recommend continuing with wet-to-dry dressings with Vashe soaked gauze twice daily.  We have previously ordered her some supplies through [facility], recommend calling [facility] for additional order, discussed with patient if she needs an additional prescription we would be happy to provide that for her.  No signs of infection or concern on exam at this time, she is overall doing well in regards to wound care.  We have discussed possible need for surgical intervention, however she has not noticed a lot of improvement with local wound care and we will continue this for the next few weeks and reevaluate.  Pictures were obtained of the patient and placed in the chart with the patient's or guardian's permission.

## 2022-04-30 ENCOUNTER — Ambulatory Visit: Payer: Medicare HMO | Attending: Cardiology | Admitting: Cardiology

## 2022-04-30 ENCOUNTER — Encounter: Payer: Self-pay | Admitting: Cardiology

## 2022-04-30 VITALS — BP 123/82 | HR 135 | Ht 65.0 in | Wt 270.6 lb

## 2022-04-30 DIAGNOSIS — R9431 Abnormal electrocardiogram [ECG] [EKG]: Secondary | ICD-10-CM | POA: Diagnosis not present

## 2022-04-30 DIAGNOSIS — S31121A Laceration of abdominal wall with foreign body, left upper quadrant without penetration into peritoneal cavity, initial encounter: Secondary | ICD-10-CM | POA: Diagnosis not present

## 2022-04-30 DIAGNOSIS — J986 Disorders of diaphragm: Secondary | ICD-10-CM | POA: Diagnosis not present

## 2022-04-30 DIAGNOSIS — R0609 Other forms of dyspnea: Secondary | ICD-10-CM | POA: Diagnosis not present

## 2022-04-30 MED ORDER — METOPROLOL TARTRATE 50 MG PO TABS: 50.0000 mg | ORAL_TABLET | Freq: Two times a day (BID) | ORAL | 3 refills | Status: DC

## 2022-04-30 NOTE — Progress Notes (Signed)
Cardiology Office Note:    Date:  04/30/2022   ID:  Carla Sloan, DOB Mar 20, 1960, MRN [id number]  PCP:  [name], NP-C   [place] Providers Cardiologist:  None     Referring MD: Davonna Belling, MD    History of Present Illness:    Carla Sloan is a 62 y.o. female here for the evaluation of tachycardia at the request of Davonna Belling, MD.  She was seen in the emergency department on 04/11/2022 after presenting to this plastic surgery office.  Heart rate was fast.  She had a panniculectomy about 3 weeks prior to this ER visit.  She had required a transfusion for anemia.  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  Appeared to be sinus tachycardia.  Hemoglobin was stable.  CT of the chest was negative for PE.  Brother has rapid HR a well  Denies any chest pain.  She does have shortness of breath.  No syncope.  Sometimes she can feel her heart racing but other times she cannot.  Several EKGs were reviewed.  Her EKG from May 2023 was normal sinus rhythm at normal rates.  She has 2 subsequent tachycardic EKGs 1 at 153 and 1 at 133.  Prior gastric sleeve  Past Medical History:  Diagnosis Date   Arthritis    Depression    Hypertension    Muscle spasms of lower extremity    and back   Tachycardia     Past Surgical History:  Procedure Laterality Date   ABDOMINOPLASTY/PANNICULECTOMY WITH LIPOSUCTION N/A 03/19/2022   Procedure: PANNICULECTOMY WITH LIPOSUCTION;  Surgeon: Wallace Going, DO;  Location: [facility];  Service: Plastics;  Laterality: N/A;   COLONOSCOPY WITH PROPOFOL N/A 09/24/2012   Procedure: COLONOSCOPY WITH PROPOFOL;  Surgeon: Jeryl Columbia, MD;  Location: [facility] ENDOSCOPY;  Service: Endoscopy;  Laterality: N/A;   COLONOSCOPY WITH PROPOFOL N/A 05/19/2016   Procedure: COLONOSCOPY WITH PROPOFOL;  Surgeon: Clarene Essex, MD;  Location: [facility] ENDOSCOPY;  Service:  Endoscopy;  Laterality: N/A;   LAPAROSCOPIC GASTRIC SLEEVE RESECTION N/A 04/24/2020   Procedure: LAPAROSCOPIC GASTRIC SLEEVE RESECTION;  Surgeon: Clovis Riley, MD;  Location: [facility] ORS;  Service: General;  Laterality: N/A;   NO PAST SURGERIES     TUBAL LIGATION     UPPER GI ENDOSCOPY N/A 04/24/2020   Procedure: UPPER GI ENDOSCOPY;  Surgeon: Clovis Riley, MD;  Location: [facility];  Service: General;  Laterality: N/A;    Current Medications: Current Meds  Medication Sig   acetaminophen (TYLENOL) 500 MG tablet Take 1 tablet (500 mg total) by mouth every 6 (six) hours as needed. (Patient taking differently: Take 500 mg by mouth every 6 (six) hours as needed for mild pain, fever or headache.)   azelastine (OPTIVAR) 0.05 % ophthalmic solution Place 1 drop into both eyes 2 (two) times daily.   bimatoprost (LUMIGAN) 0.03 % ophthalmic solution Place 1 drop into both eyes at bedtime.   brimonidine-timolol (COMBIGAN) 0.2-0.5 % ophthalmic solution Place 1 drop into both eyes every 12 (twelve) hours.   calcium citrate-vitamin D (CELEBRATE CALCIUM CITRATE) 500-500 MG-UNIT chewable tablet Chew 1 tablet by mouth every evening.   CEQUA 0.09 % SOLN Place 1 drop into both eyes 2 (two) times daily.   citalopram (CELEXA) 40 MG tablet Take 40 mg by mouth daily.   cyclobenzaprine (FLEXERIL) 10 MG tablet Take 10 mg by mouth 3 (three)  times daily.   diclofenac Sodium (VOLTAREN) 1 % GEL Apply 4 g topically 4 (four) times daily.   Elastic Bandages & Supports (WRIST SPLINT/COCK-UP/LEFT L) MISC Wear nightly and with exacerbating activities.   Elastic Bandages & Supports (WRIST SPLINT/COCK-UP/RIGHT L) MISC Wear nightly and with exacerbating activities.   ergocalciferol (VITAMIN D2) 1.25 MG (50000 UT) capsule Take 50,000 Units by mouth once a week.   ferrous sulfate 324 MG TBEC Take 324 mg by mouth daily with breakfast.   lidocaine (LIDODERM) 5 % Place 1 patch onto the skin daily. Remove & Discard patch within 12 hours or  as directed by MD   Lurasidone HCl (LATUDA) 60 MG TABS Take 60 mg by mouth at bedtime.   metoprolol tartrate (LOPRESSOR) 50 MG tablet Take 1 tablet (50 mg total) by mouth 2 (two) times daily.   Multiple Vitamins-Minerals (CELEBRATE MULTI-COMPLETE 45) CHEW Chew 1 each by mouth daily.   ondansetron (ZOFRAN) 4 MG tablet Take 1 tablet (4 mg total) by mouth every 8 (eight) hours as needed for nausea or vomiting.   orphenadrine (NORFLEX) 100 MG tablet Take 1 tablet (100 mg total) by mouth 2 (two) times daily.   oxyCODONE-acetaminophen (PERCOCET) 10-325 MG tablet Take 1 tablet by mouth twice a day as needed. (Patient taking differently: Take 1 tablet by mouth 2 (two) times daily as needed for pain.)   pravastatin (PRAVACHOL) 20 MG tablet Take 1 tablet (20 mg total) by mouth daily.   XARELTO 20 MG TABS tablet Take 20 mg by mouth daily.     Allergies:   Patient has no known allergies.   Social History   Socioeconomic History   Marital status: Single    Spouse name: Not on file   Number of children: Not on file   Years of education: Not on file   Highest education level: Not on file  Occupational History   Not on file  Tobacco Use   Smoking status: Never   Smokeless tobacco: Never  Vaping Use   Vaping Use: Never used  Substance and Sexual Activity   Alcohol use: No   Drug use: No   Sexual activity: Yes    Partners: Male    Birth control/protection: None  Other Topics Concern   Not on file  Social History Narrative   Not on file   Social Determinants of Health   Financial Resource Strain: Not on file  Food Insecurity: No Food Insecurity (03/31/2022)   Hunger Vital Sign    Worried About Running Out of Food in the Last Year: Never true    Ran Out of Food in the Last Year: Never true  Transportation Needs: No Transportation Needs (03/31/2022)   PRAPARE - Hydrologist (Medical): No    Lack of Transportation (Non-Medical): No  Physical Activity: Not on file   Stress: Not on file  Social Connections: Not on file     Family History: The patient's family history includes Breast cancer in her mother; Diabetes in her father and mother; Hypertension in her father and mother.  ROS:   Please see the history of present illness.     All other systems reviewed and are negative.  EKGs/Labs/Other Studies Reviewed:    The following studies were reviewed today:   EKG: Multiple reviewed as above  Recent Labs: 03/23/2022: Magnesium 2.0 04/11/2022: ALT 14; BUN 11; Creatinine, Ser 0.66; Hemoglobin 9.4; Platelets 611; Potassium 4.2; Sodium 138; TSH 1.590  Recent Lipid Panel  Component Value Date/Time   CHOL 163 01/09/2022 1620   TRIG 51.0 01/09/2022 1620   HDL 62.00 01/09/2022 1620   CHOLHDL 3 01/09/2022 1620   VLDL 10.2 01/09/2022 1620   LDLCALC 91 01/09/2022 1620   LDLCALC 121 (H) 03/27/2021 1448    Physical Exam:    VS:  BP 123/82   Pulse (!) 135   Ht '5\' 5"'$  (1.651 m)   Wt 270 lb 9.6 oz (122.7 kg)   LMP 09/25/2015 (Approximate)   SpO2 98%   BMI 45.03 kg/m     Wt Readings from Last 3 Encounters:  04/30/22 270 lb 9.6 oz (122.7 kg)  04/16/22 267 lb 3.2 oz (121.2 kg)  04/11/22 263 lb (119.3 kg)     GEN:  Well nourished, well developed in no acute distress HEENT: Normal NECK: No JVD; No carotid bruits LYMPHATICS: No lymphadenopathy CARDIAC: Tachycardic regular, no murmurs, rubs, gallops RESPIRATORY:  Clear to auscultation without rales, wheezing or rhonchi  ABDOMEN: Soft, non-tender, non-distended MUSCULOSKELETAL:  No edema; No deformity  SKIN: Warm and dry NEUROLOGIC:  Alert and oriented x 3 PSYCHIATRIC:  Normal affect   ASSESSMENT:    1. Nonspecific abnormal electrocardiogram (ECG) (EKG)   2. Elevated diaphragm   3. Dyspnea on exertion    PLAN:    In order of problems listed above:  Narrow complex tachycardia - Appears to be atrial tachycardia or perhaps sinus tachycardia.  Atrial flutter is a possibility however  there are 2 distinct EKGs with differing heart rates which makes this less of a possibility.  Could just be sinus tachycardia.  CT scan of the chest personally reviewed and interpreted does show an elevated left hemidiaphragm with heart structures on the right side of the chest.  Perhaps there is some degree of atrial stretch present. -I discussed with Dr. Curt Bears of electrophysiology as well.  We reviewed EKGs.  We will try metoprolol tartrate 50 mg twice a day.  Blood pressure should tolerate.  We will see her back soon in 1 month. - At this point with the tachycardia, it may be a good idea to hold off on surgical procedure until this is better clarified. - We will check an echocardiogram. - TSH and electrolytes were normal.          Medication Adjustments/Labs and Tests Ordered: Current medicines are reviewed at length with the patient today.  Concerns regarding medicines are outlined above.  Orders Placed This Encounter  Procedures   ECHOCARDIOGRAM COMPLETE   Meds ordered this encounter  Medications   metoprolol tartrate (LOPRESSOR) 50 MG tablet    Sig: Take 1 tablet (50 mg total) by mouth 2 (two) times daily.    Dispense:  180 tablet    Refill:  3    Patient Instructions  Medication Instructions:   START Lopressor one (1) tablet by mouth ( 50 mg) twice daily.   *If you need a refill on your cardiac medications before your next appointment, please call your pharmacy*   Lab Work:  None ordered.  If you have labs (blood work) drawn today and your tests are completely normal, you will receive your results only by: [place] (if you have MyChart) OR A paper copy in the mail If you have any lab test that is abnormal or we need to change your treatment, we will call you to review the results.   Testing/Procedures:  Your physician has requested that you have an echocardiogram. Echocardiography is a painless test that uses sound  waves to create images of your heart. It  provides your doctor with information about the size and shape of your heart and how well your heart's chambers and valves are working. This procedure takes approximately one hour. There are no restrictions for this procedure. Please do NOT wear perfume or lotions (deodorant is allowed). Please arrive 15 minutes prior to your appointment time.  Follow-Up: At [facility], you and your health needs are our priority.  As part of our continuing mission to provide you with exceptional heart care, we have created designated Provider Care Teams.  These Care Teams include your primary Cardiologist (physician) and Advanced Practice Providers (APPs -  Physician Assistants and Nurse Practitioners) who all work together to provide you with the care you need, when you need it.  We recommend signing up for the patient portal called "MyChart".  Sign up information is provided on this After Visit Summary.  MyChart is used to connect with patients for Virtual Visits (Telemedicine).  Patients are able to view lab/test results, encounter notes, upcoming appointments, etc.  Non-urgent messages can be sent to your provider as well.   To learn more about what you can do with MyChart, go to [email or web address].    Your next appointment:   1 month(s)  Provider:   Ambrose Pancoast, NP              Signed, Candee Furbish, MD  04/30/2022 3:18 PM    [place]

## 2022-04-30 NOTE — Patient Instructions (Signed)
Medication Instructions:   START Lopressor one (1) tablet by mouth ( 50 mg) twice daily.   *If you need a refill on your cardiac medications before your next appointment, please call your pharmacy*   Lab Work:  None ordered.  If you have labs (blood work) drawn today and your tests are completely normal, you will receive your results only by: [place] (if you have MyChart) OR A paper copy in the mail If you have any lab test that is abnormal or we need to change your treatment, we will call you to review the results.   Testing/Procedures:  Your physician has requested that you have an echocardiogram. Echocardiography is a painless test that uses sound waves to create images of your heart. It provides your doctor with information about the size and shape of your heart and how well your heart's chambers and valves are working. This procedure takes approximately one hour. There are no restrictions for this procedure. Please do NOT wear perfume or lotions (deodorant is allowed). Please arrive 15 minutes prior to your appointment time.  Follow-Up: At [facility], you and your health needs are our priority.  As part of our continuing mission to provide you with exceptional heart care, we have created designated Provider Care Teams.  These Care Teams include your primary Cardiologist (physician) and Advanced Practice Providers (APPs -  Physician Assistants and Nurse Practitioners) who all work together to provide you with the care you need, when you need it.  We recommend signing up for the patient portal called "MyChart".  Sign up information is provided on this After Visit Summary.  MyChart is used to connect with patients for Virtual Visits (Telemedicine).  Patients are able to view lab/test results, encounter notes, upcoming appointments, etc.  Non-urgent messages can be sent to your provider as well.   To learn more about what you can do with MyChart, go to  [email or web address].    Your next appointment:   1 month(s)  Provider:   Ambrose Pancoast, NP

## 2022-05-06 ENCOUNTER — Ambulatory Visit: Payer: Medicaid Other | Admitting: Interventional Cardiology

## 2022-05-09 ENCOUNTER — Other Ambulatory Visit: Payer: Self-pay | Admitting: Family Medicine

## 2022-05-09 DIAGNOSIS — Z1231 Encounter for screening mammogram for malignant neoplasm of breast: Secondary | ICD-10-CM

## 2022-05-13 ENCOUNTER — Ambulatory Visit (INDEPENDENT_AMBULATORY_CARE_PROVIDER_SITE_OTHER): Payer: Medicare HMO | Admitting: Surgical

## 2022-05-13 VITALS — BP 165/80 | HR 114

## 2022-05-13 DIAGNOSIS — M793 Panniculitis, unspecified: Secondary | ICD-10-CM

## 2022-05-13 NOTE — Progress Notes (Signed)
Patient is a very pleasant 62 year old female here for evaluation of her abdomen after panniculectomy with Dr. Marla Roe on 03/19/2022.  We have been doing dressing changes to midline left lateral and right lateral abdominal wounds with wet-to-dry gauze.  She has noticed significant improvement in this.  She has been doing really well and has not had any issues over the last 2 weeks since her last appointment.  She does report that she saw cardiology for workup related to tachycardia and they recommend holding off on any further surgical procedures at this time.  Patient is not having any infectious symptoms.  She is pleased with her progress so far.  Chaperone present on exam Patient is well-developed, well-nourished, no acute distress, breathing is unlabored. On exam abdominal wounds appear to be improving and healing well. Right midline abdominal wound is 4 x 1.5 x 1 cm, left midline abdominal wound is 6.5 x 3 x 0.4 cm.  The left lateral abdominal wound is 6 x 1 x 0.5 cm. There is no erythema or cellulitic changes noted of her abdomen, no tenderness noted with palpation.  No foul odors are noted at this time.  The rest of the abdominal incisions are intact and appear to be healing very well.  A/P:  Recommend continuing with wet-to-dry soaked gauze dressing changes daily to midline and left lateral abdominal wounds.  I do not see any signs of infection or concern on exam today.  We will hold off on any surgical intervention given she is continuing to improve and per recommendations of cardiology to avoid any further surgical procedures given her cardiology workup.  Pictures were taken and placed in her chart with her permission.  We will plan to see her back in about 10 to 14 days.

## 2022-05-21 DIAGNOSIS — H35033 Hypertensive retinopathy, bilateral: Secondary | ICD-10-CM | POA: Diagnosis not present

## 2022-05-21 DIAGNOSIS — H0102B Squamous blepharitis left eye, upper and lower eyelids: Secondary | ICD-10-CM | POA: Diagnosis not present

## 2022-05-21 DIAGNOSIS — H0102A Squamous blepharitis right eye, upper and lower eyelids: Secondary | ICD-10-CM | POA: Diagnosis not present

## 2022-05-21 DIAGNOSIS — H5010 Unspecified exotropia: Secondary | ICD-10-CM | POA: Diagnosis not present

## 2022-05-21 DIAGNOSIS — H401232 Low-tension glaucoma, bilateral, moderate stage: Secondary | ICD-10-CM | POA: Diagnosis not present

## 2022-05-21 DIAGNOSIS — Z961 Presence of intraocular lens: Secondary | ICD-10-CM | POA: Diagnosis not present

## 2022-05-22 ENCOUNTER — Ambulatory Visit (INDEPENDENT_AMBULATORY_CARE_PROVIDER_SITE_OTHER): Payer: Medicare HMO | Admitting: Surgical

## 2022-05-22 DIAGNOSIS — M793 Panniculitis, unspecified: Secondary | ICD-10-CM

## 2022-05-22 NOTE — Progress Notes (Signed)
Patient is a very pleasant 62 year old female here for follow-up after panniculectomy with Dr. Marla Roe on 03/19/2022.  She is approximately 2 months postop.  She did develop some postoperative wounds, particularly midline, left lateral and mid to right lateral.  She has been doing wet-to-dry gauze dressing changes with hypochlorous acid soaked gauze.  She reports this has been going very well and she has noticed an improvement since her last appointment 10 days ago.  She does not report any infectious symptoms.  She reports that since surgery she has had a significant improvement in her function and has been doing very well.  Chaperone present on exam On exam all of her abdominal wounds appear to be improving and healing very well.  The depth of all of the abdominal wounds significantly improved and are nearly flush with the surrounding epithelium.  The left lateral abdominal wound is 5.5 x 1 x 0.3 cm.   The surrounding abdominal skin is healing very well, there is no surrounding erythema or cellulitic changes.  There is no tenderness noted with palpation.  There is no subcutaneous fluid collections or drainage noted from the abdominal wound.  No foul odor is noted.   A/P:  We discussed changing dressing regimen, recommend Vaseline and gauze to abdominal wounds daily.  She has progressed very well and did not feel as if any surgical intervention will be necessary at this time.  She did see cardiology who recommended no further surgery due to her tachycardia.  We we will plan to see her back in 2 weeks to reevaluate.  There is no signs of infection or concern on exam.  Pictures were taken and placed in patient's chart with patient's permission.

## 2022-05-28 ENCOUNTER — Ambulatory Visit (HOSPITAL_COMMUNITY): Payer: Medicare HMO | Attending: Internal Medicine

## 2022-05-28 DIAGNOSIS — R9431 Abnormal electrocardiogram [ECG] [EKG]: Secondary | ICD-10-CM | POA: Insufficient documentation

## 2022-05-28 LAB — ECHOCARDIOGRAM COMPLETE
Area-P 1/2: 3.8 cm2
S' Lateral: 4.6 cm

## 2022-06-06 ENCOUNTER — Ambulatory Visit (INDEPENDENT_AMBULATORY_CARE_PROVIDER_SITE_OTHER): Payer: Medicare HMO | Admitting: Surgical

## 2022-06-06 DIAGNOSIS — M793 Panniculitis, unspecified: Secondary | ICD-10-CM

## 2022-06-06 NOTE — Progress Notes (Signed)
Patient is a very pleasant 62 year old female here for follow-up after panniculectomy with Dr. Ulice Bold on 03/19/2022.  She is 11 weeks postop.  She did develop some postoperative wounds, particularly midline, left lateral and mid to right lateral.  She has been doing Vaseline and gauze dressing changes to these areas over the past 2 weeks.  She reports that this has been going well and she has noticed some improvement.  She reports that she is feeling well, she is not having any infectious symptoms.  She reports she has been exercising at the gym, particularly walking.  She does have some questions about lifting some light weights.  Chaperone present on exam Patient is well-developed, well-nourished, no acute distress, breathing is unlabored.  On exam all of her abdominal wounds appear to be healing well, there is significant improvement in the depth of all the wounds.   There is no erythema or cellulitic changes noted.  There is no foul odor is noted.  No active drainage noted.  She has no tenderness noted with palpation of her abdomen.      A/P:  Recommend continuing with Vaseline and gauze to abdominal wounds 1-2 times daily.  She continues to have improvement and progression.  We applied Xeroform, 4 x 4 gauze and Medipore tape to the abdominal wounds today.    There is no signs of infection or concern on exam.  Pictures were taken and placed in her chart with her permission.  We will plan to see her back in 3 weeks for reevaluation.  She knows to call with questions or concerns.

## 2022-06-08 NOTE — H&P (View-Only) (Signed)
Office Visit    Patient Name: Carla Sloan Date of Encounter: 06/08/2022  Primary Care Provider:  Avanell Shackleton, NP-C Primary Cardiologist:  None Primary Electrophysiologist: None  Chief Complaint    Carla Sloan is a 62 y.o. female with PMH of sinus tachycardia, DVT (Xarelto) no longer ,morbid obesity s/p gastric sleeve, HTN, depression, arthritis who presents today for 1 month follow-up.  Past Medical History    Past Medical History:  Diagnosis Date   Arthritis    Depression    Hypertension    Muscle spasms of lower extremity    and back   Tachycardia    Past Surgical History:  Procedure Laterality Date   ABDOMINOPLASTY/PANNICULECTOMY WITH LIPOSUCTION N/A 03/19/2022   Procedure: PANNICULECTOMY WITH LIPOSUCTION;  Surgeon: Peggye Form, DO;  Location: [facility];  Service: Plastics;  Laterality: N/A;   COLONOSCOPY WITH PROPOFOL N/A 09/24/2012   Procedure: COLONOSCOPY WITH PROPOFOL;  Surgeon: Petra Kuba, MD;  Location: [facility];  Service: Endoscopy;  Laterality: N/A;   COLONOSCOPY WITH PROPOFOL N/A 05/19/2016   Procedure: COLONOSCOPY WITH PROPOFOL;  Surgeon: Vida Rigger, MD;  Location: [facility];  Service: Endoscopy;  Laterality: N/A;   LAPAROSCOPIC GASTRIC SLEEVE RESECTION N/A 04/24/2020   Procedure: LAPAROSCOPIC GASTRIC SLEEVE RESECTION;  Surgeon: Berna Bue, MD;  Location: [facility];  Service: General;  Laterality: N/A;   NO PAST SURGERIES     TUBAL LIGATION     UPPER GI ENDOSCOPY N/A 04/24/2020   Procedure: UPPER GI ENDOSCOPY;  Surgeon: Berna Bue, MD;  Location: [facility];  Service: General;  Laterality: N/A;    Allergies  No Known Allergies  History of Present Illness    Carla Sloan  is a 62 year old female with the above mention past medical history who presents today for 1 month follow-up of tachycardia.  Carla Sloan was seen initially by Dr. Anne Fu on 04/30/2022 for complaint of sinus  tachycardia.  She underwent a annulectomy 3 weeks prior to ED visit.  She required transfusion for anemia following procedure.  During ED visit pulse was 153 with a narrow complex sinus tachycardia noted.  CT of the chest was completed and was negative.  She was started on metoprolol 50 mg twice daily and 2D echo was completed that showed EF of 45-50% with mildly decreased LV function, moderately dilated LA and mildly dilated RA.   Carla Sloan presents today for 1 month follow-up alone.  Since last being seen in the office patient reports that she is doing well and has not experienced any sustained episodes of tachycardia since previous visit.  She is euvolemic on exam today and blood pressure was well-controlled at 124/72.  Her heart rate was 61 bpm.  She is currently increasing her activity and is walking on a treadmill at the gym 1 mile per session. During today's visit we reviewed the results of her recent echocardiogram and discussed the ramifications and pathophysiology of a decreased LV function.  We reviewed her family history and she reports that she has 1 brother with heart failure and 1 brother that has a pacemaker.  Due to her new decreased LV function we will evaluate for possible cause with further diagnostic testing.  Patient denies chest pain, palpitations, dyspnea, PND, orthopnea, nausea, vomiting, dizziness, syncope, edema, weight gain, or early satiety.   Home Medications    Current Outpatient Medications  Medication Sig Dispense Refill   acetaminophen (TYLENOL) 500 MG tablet Take  1 tablet (500 mg total) by mouth every 6 (six) hours as needed. (Patient taking differently: Take 500 mg by mouth every 6 (six) hours as needed for mild pain, fever or headache.) 30 tablet 0   azelastine (OPTIVAR) 0.05 % ophthalmic solution Place 1 drop into both eyes 2 (two) times daily.     bimatoprost (LUMIGAN) 0.03 % ophthalmic solution Place 1 drop into both eyes at bedtime.     brimonidine-timolol  (COMBIGAN) 0.2-0.5 % ophthalmic solution Place 1 drop into both eyes every 12 (twelve) hours.     calcium citrate-vitamin D (CELEBRATE CALCIUM CITRATE) 500-500 MG-UNIT chewable tablet Chew 1 tablet by mouth every evening.     CEQUA 0.09 % SOLN Place 1 drop into both eyes 2 (two) times daily.     citalopram (CELEXA) 40 MG tablet Take 40 mg by mouth daily.     cyclobenzaprine (FLEXERIL) 10 MG tablet Take 10 mg by mouth 3 (three) times daily.     diclofenac Sodium (VOLTAREN) 1 % GEL Apply 4 g topically 4 (four) times daily. 100 g 1   Elastic Bandages & Supports (WRIST SPLINT/COCK-UP/LEFT L) MISC Wear nightly and with exacerbating activities. 1 each 0   Elastic Bandages & Supports (WRIST SPLINT/COCK-UP/RIGHT L) MISC Wear nightly and with exacerbating activities. 1 each 0   ergocalciferol (VITAMIN D2) 1.25 MG (50000 UT) capsule Take 50,000 Units by mouth once a week.     ferrous sulfate 324 MG TBEC Take 324 mg by mouth daily with breakfast.     lidocaine (LIDODERM) 5 % Place 1 patch onto the skin daily. Remove & Discard patch within 12 hours or as directed by MD 30 patch 0   Lurasidone HCl (LATUDA) 60 MG TABS Take 60 mg by mouth at bedtime.     metoprolol tartrate (LOPRESSOR) 50 MG tablet Take 1 tablet (50 mg total) by mouth 2 (two) times daily. 180 tablet 3   Multiple Vitamins-Minerals (CELEBRATE MULTI-COMPLETE 45) CHEW Chew 1 each by mouth daily.     ondansetron (ZOFRAN) 4 MG tablet Take 1 tablet (4 mg total) by mouth every 8 (eight) hours as needed for nausea or vomiting. 20 tablet 0   orphenadrine (NORFLEX) 100 MG tablet Take 1 tablet (100 mg total) by mouth 2 (two) times daily. 30 tablet 0   oxyCODONE-acetaminophen (PERCOCET) 10-325 MG tablet Take 1 tablet by mouth twice a day as needed. (Patient taking differently: Take 1 tablet by mouth 2 (two) times daily as needed for pain.) 60 tablet 0   pravastatin (PRAVACHOL) 20 MG tablet Take 1 tablet (20 mg total) by mouth daily. 90 tablet 1   XARELTO 20  MG TABS tablet Take 20 mg by mouth daily.     No current facility-administered medications for this visit.     Review of Systems  Please see the history of present illness.    (+) Palpitations  All other systems reviewed and are otherwise negative except as noted above.  Physical Exam    Wt Readings from Last 3 Encounters:  04/30/22 270 lb 9.6 oz (122.7 kg)  04/16/22 267 lb 3.2 oz (121.2 kg)  04/11/22 263 lb (119.3 kg)   LK:GMWNU were no vitals filed for this visit.,There is no height or weight on file to calculate BMI.  Constitutional:      Appearance: Healthy appearance. Not in distress.  Neck:     Vascular: JVD normal.  Pulmonary:     Effort: Pulmonary effort is normal.     Breath  sounds: No wheezing. No rales. Diminished in the bases Cardiovascular:     Normal rate. Regular rhythm. Normal S1. Normal S2.      Murmurs: There is no murmur.  Edema:    Peripheral edema absent.  Abdominal:     Palpations: Abdomen is soft non tender. There is no hepatomegaly.  Skin:    General: Skin is warm and dry.  Neurological:     General: No focal deficit present.     Mental Status: Alert and oriented to person, place and time.     Cranial Nerves: Cranial nerves are intact.  EKG/LABS/ Recent Cardiac Studies    ECG personally reviewed by me today -none completed today   Lab Results  Component Value Date   WBC 6.8 04/11/2022   HGB 9.4 (L) 04/11/2022   HCT 31.2 (L) 04/11/2022   MCV 97.5 04/11/2022   PLT 611 (H) 04/11/2022   Lab Results  Component Value Date   CREATININE 0.66 04/11/2022   BUN 11 04/11/2022   NA 138 04/11/2022   K 4.2 04/11/2022   CL 101 04/11/2022   CO2 25 04/11/2022   Lab Results  Component Value Date   ALT 14 04/11/2022   AST 23 04/11/2022   ALKPHOS 80 04/11/2022   BILITOT 0.2 (L) 04/11/2022   Lab Results  Component Value Date   CHOL 163 01/09/2022   HDL 62.00 01/09/2022   LDLCALC 91 01/09/2022   TRIG 51.0 01/09/2022   CHOLHDL 3 01/09/2022     No results found for: "HGBA1C"  Cardiac Studies & Procedures       ECHOCARDIOGRAM  ECHOCARDIOGRAM COMPLETE 05/28/2022  Narrative ECHOCARDIOGRAM REPORT    Patient Name:   Carla Sloan Date of Exam: 05/28/2022 Medical Rec #:  [id number]                Height:       65.0 in Accession #:    [id number]               Weight:       270.6 lb Date of Birth:  Jan 13, 1961                BSA:          2.249 m Patient Age:    61 years                 BP:           123/82 mmHg Patient Gender: F                        HR:           97 bpm. Exam Location:  [address]  Procedure: 2D Echo, Cardiac Doppler and Color Doppler  Indications:    R94.31 Abnormal EKG  History:        Patient has no prior history of Echocardiogram examinations. Abnormal ECG, Arrythmias:Tachycardia, Signs/Symptoms:Shortness of Breath; Risk Factors:Hypertension. Marked elevation of left hemidiaphragm noted on CT, relative rightward shift of heart and imaging windows- GAA MD.  Sonographer:    Chanetta Marshall BA, RDCS Referring Phys: 58 MARK C SKAINS   Sonographer Comments: Technically difficult study due to poor echo windows and patient is obese. Image acquisition challenging due to patient body habitus. IMPRESSIONS   1. Left ventricular ejection fraction, by estimation, is 45 to 50%. The left ventricle has mildly decreased function. The left ventricle demonstrates global hypokinesis. The left ventricular internal cavity size  was mildly dilated. Left ventricular diastolic parameters are indeterminate. 2. Right ventricular systolic function is normal. The right ventricular size is normal. There is mildly elevated pulmonary artery systolic pressure. The estimated right ventricular systolic pressure is 36.9 mmHg. 3. Left atrial size was moderately dilated. 4. Right atrial size was mildly dilated. 5. The mitral valve is grossly normal. Trivial mitral valve regurgitation. No evidence of mitral stenosis. 6. The  aortic valve is tricuspid. Aortic valve regurgitation is not visualized. No aortic stenosis is present. 7. The inferior vena cava is dilated in size with >50% respiratory variability, suggesting right atrial pressure of 8 mmHg.  FINDINGS Left Ventricle: Left ventricular ejection fraction, by estimation, is 45 to 50%. The left ventricle has mildly decreased function. The left ventricle demonstrates global hypokinesis. The left ventricular internal cavity size was mildly dilated. There is no left ventricular hypertrophy. Left ventricular diastolic parameters are indeterminate.  Right Ventricle: The right ventricular size is normal. No increase in right ventricular wall thickness. Right ventricular systolic function is normal. There is mildly elevated pulmonary artery systolic pressure. The tricuspid regurgitant velocity is 2.69 m/s, and with an assumed right atrial pressure of 8 mmHg, the estimated right ventricular systolic pressure is 36.9 mmHg.  Left Atrium: Left atrial size was moderately dilated.  Right Atrium: Right atrial size was mildly dilated.  Pericardium: There is no evidence of pericardial effusion.  Mitral Valve: The mitral valve is grossly normal. Trivial mitral valve regurgitation. No evidence of mitral valve stenosis.  Tricuspid Valve: The tricuspid valve is normal in structure. Tricuspid valve regurgitation is trivial. No evidence of tricuspid stenosis.  Aortic Valve: The aortic valve is tricuspid. Aortic valve regurgitation is not visualized. No aortic stenosis is present.  Pulmonic Valve: The pulmonic valve was normal in structure. Pulmonic valve regurgitation is mild. No evidence of pulmonic stenosis.  Aorta: The aortic root is normal in size and structure.  Venous: The inferior vena cava is dilated in size with greater than 50% respiratory variability, suggesting right atrial pressure of 8 mmHg.  IAS/Shunts: No atrial level shunt detected by color flow  Doppler.   LEFT VENTRICLE PLAX 2D LVIDd:         5.80 cm   Diastology LVIDs:         4.60 cm   LV e' medial:    6.09 cm/s LV PW:         0.80 cm   LV E/e' medial:  12.8 LV IVS:        0.70 cm   LV e' lateral:   7.62 cm/s LVOT diam:     2.50 cm   LV E/e' lateral: 10.2 LV SV:         88 LV SV Index:   39 LVOT Area:     4.91 cm   RIGHT VENTRICLE            IVC RV S prime:     7.62 cm/s  IVC diam: 1.40 cm TAPSE (M-mode): 2.8 cm RVSP:           31.9 mmHg  LEFT ATRIUM              Index        RIGHT ATRIUM LA diam:        3.60 cm  1.60 cm/m   RA Pressure: 3.00 mmHg LA Vol (A2C):   117.0 ml 52.02 ml/m LA Vol (A4C):   106.0 ml 47.12 ml/m LA Biplane Vol: 114.0 ml 50.68 ml/m AORTIC  VALVE LVOT Vmax:   85.00 cm/s LVOT Vmean:  57.900 cm/s LVOT VTI:    0.180 m  AORTA Ao Root diam: 3.50 cm  MITRAL VALVE               TRICUSPID VALVE MV Area (PHT): 3.80 cm    TR Peak grad:   28.9 mmHg MV Decel Time: 200 msec    TR Vmax:        269.00 cm/s MV E velocity: 78.00 cm/s  Estimated RAP:  3.00 mmHg MV A velocity: 93.80 cm/s  RVSP:           31.9 mmHg MV E/A ratio:  0.83 SHUNTS Systemic VTI:  0.18 m Systemic Diam: 2.50 cm  Weston Brass MD Electronically signed by Weston Brass MD Signature Date/Time: 05/28/2022/11:03:24 PM    Final             Assessment & Plan    1.HFmrEF: -2D echo recently completed showing EF of 45-50% -Today patient reports no episodes of shortness of breath or lower extremity swelling. -We will have patient complete a exercise Myoview to evaluate for ischemic cause of newly discovered decreased LV function. -We will start her on Entresto 24/26 mg twice daily with plan to titrate GDMT further. -BMET in 2 weeks -Low sodium diet, fluid restriction <2L, and daily weights encouraged. Educated to contact our office for weight gain of 2 lbs overnight or 5 lbs in one week.   2.  Sinus tachycardia: -Patient was started on metoprolol 50 mg twice daily and  since visit she reports no recurrence of tachyarrhythmia since previous visit. -She is tolerating the metoprolol without any adverse reactions.  3.History DVT: -She was previously on Entresto for history of DVT.  She denies any recurrence of shortness of breath or lower extremity swelling.  4.  Family history of heart disease: -Patient reports that one of her brothers has a pacemaker and the other suffers from heart failure. -Patient encouraged to abstain from excess salt and cholesterol in her diet.  Disposition: Follow-up with None or APP in 1 months Shared Decision Making/Informed Consent The risks [chest pain, shortness of breath, cardiac arrhythmias, dizziness, blood pressure fluctuations, myocardial infarction, stroke/transient ischemic attack, nausea, vomiting, allergic reaction, radiation exposure, metallic taste sensation and life-threatening complications (estimated to be 1 in 10,000)], benefits (risk stratification, diagnosing coronary artery disease, treatment guidance) and alternatives of a nuclear stress test were discussed in detail with Carla Sloan and she agrees to proceed.   Medication Adjustments/Labs and Tests Ordered: Current medicines are reviewed at length with the patient today.  Concerns regarding medicines are outlined above.   Signed, Napoleon Form, Leodis Rains, NP 06/08/2022, 1:04 PM [facility]  Note:  This document was prepared using Dragon voice recognition software and may include unintentional dictation errors.

## 2022-06-08 NOTE — Progress Notes (Unsigned)
Office Visit    Patient Name: Carla Sloan Date of Encounter: 06/08/2022  Primary Care Provider:  Avanell Shackleton, NP-C Primary Cardiologist:  None Primary Electrophysiologist: None  Chief Complaint    Carla Sloan is a 62 y.o. female with PMH of sinus tachycardia, morbid obesity s/p gastric sleeve, HTN, depression, arthritis who presents today for 1 month follow-up.  Past Medical History    Past Medical History:  Diagnosis Date   Arthritis    Depression    Hypertension    Muscle spasms of lower extremity    and back   Tachycardia    Past Surgical History:  Procedure Laterality Date   ABDOMINOPLASTY/PANNICULECTOMY WITH LIPOSUCTION N/A 03/19/2022   Procedure: PANNICULECTOMY WITH LIPOSUCTION;  Surgeon: Peggye Form, DO;  Location: [facility];  Service: Plastics;  Laterality: N/A;   COLONOSCOPY WITH PROPOFOL N/A 09/24/2012   Procedure: COLONOSCOPY WITH PROPOFOL;  Surgeon: Petra Kuba, MD;  Location: [facility];  Service: Endoscopy;  Laterality: N/A;   COLONOSCOPY WITH PROPOFOL N/A 05/19/2016   Procedure: COLONOSCOPY WITH PROPOFOL;  Surgeon: Vida Rigger, MD;  Location: [facility];  Service: Endoscopy;  Laterality: N/A;   LAPAROSCOPIC GASTRIC SLEEVE RESECTION N/A 04/24/2020   Procedure: LAPAROSCOPIC GASTRIC SLEEVE RESECTION;  Surgeon: Berna Bue, MD;  Location: [facility];  Service: General;  Laterality: N/A;   NO PAST SURGERIES     TUBAL LIGATION     UPPER GI ENDOSCOPY N/A 04/24/2020   Procedure: UPPER GI ENDOSCOPY;  Surgeon: Berna Bue, MD;  Location: [facility];  Service: General;  Laterality: N/A;    Allergies  No Known Allergies  History of Present Illness    Carla Sloan  is a 62 year old female with the above mention past medical history who presents today for 1 month follow-up of tachycardia.  Carla Sloan was seen initially by Dr. Anne Fu on 04/30/2022 for complaint of sinus tachycardia.  She underwent a  annulectomy 3 weeks prior to ED visit.  She required transfusion for anemia following procedure.  During ED visit pulse was 153 with a narrow complex sinus tachycardia noted.  CT of the chest was completed and was negative.  She was started on metoprolol 50 mg twice daily and 2D echo was completed that showed EF of 45-50% with mildly decreased LV function, moderately dilated LA and mildly dilated RA.    Since last being seen in the office patient reports***.  Patient denies chest pain, palpitations, dyspnea, PND, orthopnea, nausea, vomiting, dizziness, syncope, edema, weight gain, or early satiety.     ***Notes:  Home Medications    Current Outpatient Medications  Medication Sig Dispense Refill   acetaminophen (TYLENOL) 500 MG tablet Take 1 tablet (500 mg total) by mouth every 6 (six) hours as needed. (Patient taking differently: Take 500 mg by mouth every 6 (six) hours as needed for mild pain, fever or headache.) 30 tablet 0   azelastine (OPTIVAR) 0.05 % ophthalmic solution Place 1 drop into both eyes 2 (two) times daily.     bimatoprost (LUMIGAN) 0.03 % ophthalmic solution Place 1 drop into both eyes at bedtime.     brimonidine-timolol (COMBIGAN) 0.2-0.5 % ophthalmic solution Place 1 drop into both eyes every 12 (twelve) hours.     calcium citrate-vitamin D (CELEBRATE CALCIUM CITRATE) 500-500 MG-UNIT chewable tablet Chew 1 tablet by mouth every evening.     CEQUA 0.09 % SOLN Place 1 drop into both eyes 2 (two) times daily.  citalopram (CELEXA) 40 MG tablet Take 40 mg by mouth daily.     cyclobenzaprine (FLEXERIL) 10 MG tablet Take 10 mg by mouth 3 (three) times daily.     diclofenac Sodium (VOLTAREN) 1 % GEL Apply 4 g topically 4 (four) times daily. 100 g 1   Elastic Bandages & Supports (WRIST SPLINT/COCK-UP/LEFT L) MISC Wear nightly and with exacerbating activities. 1 each 0   Elastic Bandages & Supports (WRIST SPLINT/COCK-UP/RIGHT L) MISC Wear nightly and with exacerbating activities.  1 each 0   ergocalciferol (VITAMIN D2) 1.25 MG (50000 UT) capsule Take 50,000 Units by mouth once a week.     ferrous sulfate 324 MG TBEC Take 324 mg by mouth daily with breakfast.     lidocaine (LIDODERM) 5 % Place 1 patch onto the skin daily. Remove & Discard patch within 12 hours or as directed by MD 30 patch 0   Lurasidone HCl (LATUDA) 60 MG TABS Take 60 mg by mouth at bedtime.     metoprolol tartrate (LOPRESSOR) 50 MG tablet Take 1 tablet (50 mg total) by mouth 2 (two) times daily. 180 tablet 3   Multiple Vitamins-Minerals (CELEBRATE MULTI-COMPLETE 45) CHEW Chew 1 each by mouth daily.     ondansetron (ZOFRAN) 4 MG tablet Take 1 tablet (4 mg total) by mouth every 8 (eight) hours as needed for nausea or vomiting. 20 tablet 0   orphenadrine (NORFLEX) 100 MG tablet Take 1 tablet (100 mg total) by mouth 2 (two) times daily. 30 tablet 0   oxyCODONE-acetaminophen (PERCOCET) 10-325 MG tablet Take 1 tablet by mouth twice a day as needed. (Patient taking differently: Take 1 tablet by mouth 2 (two) times daily as needed for pain.) 60 tablet 0   pravastatin (PRAVACHOL) 20 MG tablet Take 1 tablet (20 mg total) by mouth daily. 90 tablet 1   XARELTO 20 MG TABS tablet Take 20 mg by mouth daily.     No current facility-administered medications for this visit.     Review of Systems  Please see the history of present illness.    (+)*** (+)***  All other systems reviewed and are otherwise negative except as noted above.  Physical Exam    Wt Readings from Last 3 Encounters:  04/30/22 270 lb 9.6 oz (122.7 kg)  04/16/22 267 lb 3.2 oz (121.2 kg)  04/11/22 263 lb (119.3 kg)   WU:JWJXB were no vitals filed for this visit.,There is no height or weight on file to calculate BMI.  Constitutional:      Appearance: Healthy appearance. Not in distress.  Neck:     Vascular: JVD normal.  Pulmonary:     Effort: Pulmonary effort is normal.     Breath sounds: No wheezing. No rales. Diminished in the  bases Cardiovascular:     Normal rate. Regular rhythm. Normal S1. Normal S2.      Murmurs: There is no murmur.  Edema:    Peripheral edema absent.  Abdominal:     Palpations: Abdomen is soft non tender. There is no hepatomegaly.  Skin:    General: Skin is warm and dry.  Neurological:     General: No focal deficit present.     Mental Status: Alert and oriented to person, place and time.     Cranial Nerves: Cranial nerves are intact.  EKG/LABS/ Recent Cardiac Studies    ECG personally reviewed by me today - ***  Risk Assessment/Calculations:   {Does this patient have ATRIAL FIBRILLATION?:[id number]}  Lab Results  Component Value Date   WBC 6.8 04/11/2022   HGB 9.4 (L) 04/11/2022   HCT 31.2 (L) 04/11/2022   MCV 97.5 04/11/2022   PLT 611 (H) 04/11/2022   Lab Results  Component Value Date   CREATININE 0.66 04/11/2022   BUN 11 04/11/2022   NA 138 04/11/2022   K 4.2 04/11/2022   CL 101 04/11/2022   CO2 25 04/11/2022   Lab Results  Component Value Date   ALT 14 04/11/2022   AST 23 04/11/2022   ALKPHOS 80 04/11/2022   BILITOT 0.2 (L) 04/11/2022   Lab Results  Component Value Date   CHOL 163 01/09/2022   HDL 62.00 01/09/2022   LDLCALC 91 01/09/2022   TRIG 51.0 01/09/2022   CHOLHDL 3 01/09/2022    No results found for: "HGBA1C"  Cardiac Studies & Procedures       ECHOCARDIOGRAM  ECHOCARDIOGRAM COMPLETE 05/28/2022  Narrative ECHOCARDIOGRAM REPORT    Patient Name:   [facility] Date of Exam: 05/28/2022 Medical Rec #:  [id number]                Height:       65.0 in Accession #:    [id number]               Weight:       270.6 lb Date of Birth:  October 18, 1960                BSA:          2.249 m Patient Age:    61 years                 BP:           123/82 mmHg Patient Gender: F                        HR:           97 bpm. Exam Location:  [address]  Procedure: 2D Echo, Cardiac Doppler and Color Doppler  Indications:    R94.31 Abnormal  EKG  History:        Patient has no prior history of Echocardiogram examinations. Abnormal ECG, Arrythmias:Tachycardia, Signs/Symptoms:Shortness of Breath; Risk Factors:Hypertension. Marked elevation of left hemidiaphragm noted on CT, relative rightward shift of heart and imaging windows- GAA MD.  Sonographer:    Chanetta Marshall BA, RDCS Referring Phys: 42 MARK C SKAINS   Sonographer Comments: Technically difficult study due to poor echo windows and patient is obese. Image acquisition challenging due to patient body habitus. IMPRESSIONS   1. Left ventricular ejection fraction, by estimation, is 45 to 50%. The left ventricle has mildly decreased function. The left ventricle demonstrates global hypokinesis. The left ventricular internal cavity size was mildly dilated. Left ventricular diastolic parameters are indeterminate. 2. Right ventricular systolic function is normal. The right ventricular size is normal. There is mildly elevated pulmonary artery systolic pressure. The estimated right ventricular systolic pressure is 36.9 mmHg. 3. Left atrial size was moderately dilated. 4. Right atrial size was mildly dilated. 5. The mitral valve is grossly normal. Trivial mitral valve regurgitation. No evidence of mitral stenosis. 6. The aortic valve is tricuspid. Aortic valve regurgitation is not visualized. No aortic stenosis is present. 7. The inferior vena cava is dilated in size with >50% respiratory variability, suggesting right atrial pressure of 8 mmHg.  FINDINGS Left Ventricle: Left ventricular ejection fraction, by estimation, is 45 to 50%. The  left ventricle has mildly decreased function. The left ventricle demonstrates global hypokinesis. The left ventricular internal cavity size was mildly dilated. There is no left ventricular hypertrophy. Left ventricular diastolic parameters are indeterminate.  Right Ventricle: The right ventricular size is normal. No increase in right ventricular  wall thickness. Right ventricular systolic function is normal. There is mildly elevated pulmonary artery systolic pressure. The tricuspid regurgitant velocity is 2.69 m/s, and with an assumed right atrial pressure of 8 mmHg, the estimated right ventricular systolic pressure is 36.9 mmHg.  Left Atrium: Left atrial size was moderately dilated.  Right Atrium: Right atrial size was mildly dilated.  Pericardium: There is no evidence of pericardial effusion.  Mitral Valve: The mitral valve is grossly normal. Trivial mitral valve regurgitation. No evidence of mitral valve stenosis.  Tricuspid Valve: The tricuspid valve is normal in structure. Tricuspid valve regurgitation is trivial. No evidence of tricuspid stenosis.  Aortic Valve: The aortic valve is tricuspid. Aortic valve regurgitation is not visualized. No aortic stenosis is present.  Pulmonic Valve: The pulmonic valve was normal in structure. Pulmonic valve regurgitation is mild. No evidence of pulmonic stenosis.  Aorta: The aortic root is normal in size and structure.  Venous: The inferior vena cava is dilated in size with greater than 50% respiratory variability, suggesting right atrial pressure of 8 mmHg.  IAS/Shunts: No atrial level shunt detected by color flow Doppler.   LEFT VENTRICLE PLAX 2D LVIDd:         5.80 cm   Diastology LVIDs:         4.60 cm   LV e' medial:    6.09 cm/s LV PW:         0.80 cm   LV E/e' medial:  12.8 LV IVS:        0.70 cm   LV e' lateral:   7.62 cm/s LVOT diam:     2.50 cm   LV E/e' lateral: 10.2 LV SV:         88 LV SV Index:   39 LVOT Area:     4.91 cm   RIGHT VENTRICLE            IVC RV S prime:     7.62 cm/s  IVC diam: 1.40 cm TAPSE (M-mode): 2.8 cm RVSP:           31.9 mmHg  LEFT ATRIUM              Index        RIGHT ATRIUM LA diam:        3.60 cm  1.60 cm/m   RA Pressure: 3.00 mmHg LA Vol (A2C):   117.0 ml 52.02 ml/m LA Vol (A4C):   106.0 ml 47.12 ml/m LA Biplane Vol: 114.0 ml  50.68 ml/m AORTIC VALVE LVOT Vmax:   85.00 cm/s LVOT Vmean:  57.900 cm/s LVOT VTI:    0.180 m  AORTA Ao Root diam: 3.50 cm  MITRAL VALVE               TRICUSPID VALVE MV Area (PHT): 3.80 cm    TR Peak grad:   28.9 mmHg MV Decel Time: 200 msec    TR Vmax:        269.00 cm/s MV E velocity: 78.00 cm/s  Estimated RAP:  3.00 mmHg MV A velocity: 93.80 cm/s  RVSP:           31.9 mmHg MV E/A ratio:  0.83 SHUNTS Systemic VTI:  0.18 m Systemic  Diam: 2.50 cm  Weston Brass MD Electronically signed by Weston Brass MD Signature Date/Time: 05/28/2022/11:03:24 PM    Final             Assessment & Plan    1.HFmrEF: -2D echo recently completed showing EF of 45-50% -Today patient reports***  2.  Sinus tachycardia: -Patient was started on metoprolol 50 mg twice daily and since visit she reports***  3.***  4.***      Disposition: Follow-up with None or APP in *** months {Are you ordering a CV Procedure (e.g. stress test, cath, DCCV, TEE, etc)?   Press F2        :[id number]}   Medication Adjustments/Labs and Tests Ordered: Current medicines are reviewed at length with the patient today.  Concerns regarding medicines are outlined above.   Signed, Napoleon Form, Leodis Rains, NP 06/08/2022, 1:04 PM [facility]  Note:  This document was prepared using Dragon voice recognition software and may include unintentional dictation errors.

## 2022-06-09 ENCOUNTER — Encounter: Payer: Self-pay | Admitting: Nurse Practitioner

## 2022-06-09 ENCOUNTER — Ambulatory Visit: Payer: Medicare HMO | Attending: Nurse Practitioner | Admitting: Nurse Practitioner

## 2022-06-09 VITALS — BP 124/72 | HR 61 | Ht 65.0 in | Wt 281.0 lb

## 2022-06-09 DIAGNOSIS — Z86718 Personal history of other venous thrombosis and embolism: Secondary | ICD-10-CM

## 2022-06-09 DIAGNOSIS — Z8249 Family history of ischemic heart disease and other diseases of the circulatory system: Secondary | ICD-10-CM | POA: Diagnosis not present

## 2022-06-09 DIAGNOSIS — I5022 Chronic systolic (congestive) heart failure: Secondary | ICD-10-CM | POA: Diagnosis not present

## 2022-06-09 DIAGNOSIS — R000 Tachycardia, unspecified: Secondary | ICD-10-CM | POA: Diagnosis not present

## 2022-06-09 MED ORDER — ENTRESTO 24-26 MG PO TABS: 1.0000 | ORAL_TABLET | Freq: Two times a day (BID) | ORAL | 1 refills | Status: DC

## 2022-06-09 NOTE — Patient Instructions (Signed)
Medication Instructions:  START Entresto 24/26mg  Take 1 tablet twice a day  *If you need a refill on your cardiac medications before your next appointment, please call your pharmacy*   Lab Work: BMET in 2 WEEKS If you have labs (blood work) drawn today and your tests are completely normal, you will receive your results only by: MyChart Message (if you have MyChart) OR A paper copy in the mail If you have any lab test that is abnormal or we need to change your treatment, we will call you to review the results.   Testing/Procedures: Your physician has requested that you have an exercise stress myoview. For further information please visit [email or web address] Please follow instruction sheet, as given.   Follow-Up: At [facility], you and your health needs are our priority.  As part of our continuing mission to provide you with exceptional heart care, we have created designated Provider Care Teams.  These Care Teams include your primary Cardiologist (physician) and Advanced Practice Providers (APPs -  Physician Assistants and Nurse Practitioners) who all work together to provide you with the care you need, when you need it.  We recommend signing up for the patient portal called "MyChart".  Sign up information is provided on this After Visit Summary.  MyChart is used to connect with patients for Virtual Visits (Telemedicine).  Patients are able to view lab/test results, encounter notes, upcoming appointments, etc.  Non-urgent messages can be sent to your provider as well.   To learn more about what you can do with MyChart, go to [email or web address].    Your next appointment:   1 month(s)  Provider:   Robin Searing, NP        Other Instructions

## 2022-06-18 ENCOUNTER — Telehealth (HOSPITAL_COMMUNITY): Payer: Self-pay | Admitting: *Deleted

## 2022-06-18 NOTE — Telephone Encounter (Signed)
Patient given detailed instructions per Myocardial Perfusion Study Information Sheet for the test on 06/23/2022 at 8:15. Patient notified to arrive 15 minutes early and that it is imperative to arrive on time for appointment to keep from having the test rescheduled.  If you need to cancel or reschedule your appointment, please call the office within 24 hours of your appointment. . Patient verbalized understanding.Carla Sloan

## 2022-06-23 ENCOUNTER — Ambulatory Visit (HOSPITAL_COMMUNITY): Payer: Medicare HMO | Attending: Nurse Practitioner

## 2022-06-23 ENCOUNTER — Ambulatory Visit: Payer: Medicare HMO

## 2022-06-23 DIAGNOSIS — I5022 Chronic systolic (congestive) heart failure: Secondary | ICD-10-CM

## 2022-06-23 DIAGNOSIS — R000 Tachycardia, unspecified: Secondary | ICD-10-CM | POA: Diagnosis not present

## 2022-06-23 MED ORDER — TECHNETIUM TC 99M TETROFOSMIN IV KIT
32.0000 | PACK | Freq: Once | INTRAVENOUS | Status: AC | PRN
  Administered 2022-06-23: 32 via INTRAVENOUS

## 2022-06-23 MED ORDER — REGADENOSON 0.4 MG/5ML IV SOLN
0.4000 mg | Freq: Once | INTRAVENOUS | Status: AC
  Administered 2022-06-23: 0.4 mg via INTRAVENOUS

## 2022-06-24 ENCOUNTER — Ambulatory Visit (HOSPITAL_COMMUNITY): Payer: Medicare HMO

## 2022-06-24 LAB — BASIC METABOLIC PANEL
BUN/Creatinine Ratio: 20 (ref 12–28)
BUN: 14 mg/dL (ref 8–27)
CO2: 23 mmol/L (ref 20–29)
Calcium: 10.5 mg/dL — ABNORMAL HIGH (ref 8.7–10.3)
Chloride: 103 mmol/L (ref 96–106)
Creatinine, Ser: 0.69 mg/dL (ref 0.57–1.00)
Glucose: 96 mg/dL (ref 70–99)
Potassium: 4.1 mmol/L (ref 3.5–5.2)
Sodium: 142 mmol/L (ref 134–144)
eGFR: 99 mL/min/{1.73_m2} (ref >59)

## 2022-06-24 LAB — MYOCARDIAL PERFUSION IMAGING
Estimated workload: 5.5
Exercise duration (min): 4 min
Exercise duration (sec): 0 s
LV dias vol: 97 mL (ref 46–106)
LV sys vol: 71 mL
MPHR: 160 {beats}/min
Nuc Stress EF: 27 %
Peak HR: 137 {beats}/min
Percent HR: 86 %
RPE: 18
Rest HR: 89 {beats}/min
Rest Nuclear Isotope Dose: 32.6 mCi
SDS: 11
SRS: 15
SSS: 26
ST Depression (mm): 0 mm
Stress Nuclear Isotope Dose: 32 mCi
TID: 0.65

## 2022-06-24 MED ORDER — TECHNETIUM TC 99M TETROFOSMIN IV KIT
32.6000 | PACK | Freq: Once | INTRAVENOUS | Status: AC | PRN
  Administered 2022-06-24: 32.6 via INTRAVENOUS

## 2022-06-25 ENCOUNTER — Telehealth: Payer: Self-pay | Admitting: Nurse Practitioner

## 2022-06-25 NOTE — Telephone Encounter (Signed)
Call placed to review test results from previous study. Patient was not available and detailed message left to return call at earliest convenience.Marland Kitchen

## 2022-06-26 NOTE — Progress Notes (Signed)
Patient is a very pleasant 62 year old female here for follow-up after panniculectomy Dr. Ulice Bold on 03/19/2022.  She is 14 weeks postop.  Postoperatively she did develop wounds along the midline, left lateral and mid to right lateral panniculectomy incision.  They have been slowly healing over the last 4 months.  Patient reports she is doing well today, she reports she is continue with Vaseline and gauze to the abdominal wounds.  She reports that she has noticed significant improvement over the last few weeks.  She was last seen in the office on 06/06/2022, 3 weeks ago.  She is not having any infectious symptoms, she does report that she has continued to exercise and be active and has not had any issues with this.  Chaperone present on exam Patient is well-developed, well-nourished, no acute distress  On exam all of her abdominal wounds continue to heal well, there is significant improvement in the overall size and depth of all the wounds noted.  There is no erythema or cellulitic changes noted.  The base of the tumor midline and right lateral wounds are 100% healthy granulation tissue.  The left lateral abdominal wound is nearly completely healed with some overlying scab present.  No active drainage from any of the wounds, no foul odor is noted.      A/P:  Recommend continue with Vaseline and gauze to abdominal wounds 1-2 times daily, she continues to have improvement and progression.  I do not notice any signs of concern or infection on exam at this time.  Pictures were taken and placed in the patient's chart with patient's permission.  We will plan to see her back in 3 weeks for reevaluation.  Pictures were obtained of the patient and placed in the chart with the patient's or guardian's permission.

## 2022-06-27 ENCOUNTER — Telehealth: Payer: Self-pay | Admitting: Nurse Practitioner

## 2022-06-27 ENCOUNTER — Telehealth: Payer: Self-pay

## 2022-06-27 ENCOUNTER — Ambulatory Visit (INDEPENDENT_AMBULATORY_CARE_PROVIDER_SITE_OTHER): Payer: Medicare HMO | Admitting: Surgical

## 2022-06-27 ENCOUNTER — Telehealth: Payer: Self-pay | Admitting: Cardiology

## 2022-06-27 ENCOUNTER — Encounter: Payer: Self-pay | Admitting: Surgical

## 2022-06-27 VITALS — BP 129/84 | HR 77 | Ht 65.0 in | Wt 281.8 lb

## 2022-06-27 DIAGNOSIS — M793 Panniculitis, unspecified: Secondary | ICD-10-CM | POA: Diagnosis not present

## 2022-06-27 DIAGNOSIS — R9439 Abnormal result of other cardiovascular function study: Secondary | ICD-10-CM

## 2022-06-27 DIAGNOSIS — I519 Heart disease, unspecified: Secondary | ICD-10-CM

## 2022-06-27 NOTE — Telephone Encounter (Signed)
Contacted the patient to discuss cath instructions.  Patient agreeable and voiced understanding.   Copy of instructions printed and placed up front for pick up when patient comes to the office on Monday for labs.

## 2022-06-27 NOTE — Telephone Encounter (Signed)
     Cardiac Catheterization   You are scheduled for a Cardiac Catheterization on Friday, May 10 with Dr. Bryan Lemma.  1. Please arrive at the [facility] (Main Entrance A) at [facility]: [address] [place], [place] [address] at 7:00 AM (This time is 2 hour(s) before your procedure to ensure your preparation). Free valet parking service is available. You will check in at ADMITTING. The support person will be asked to wait in the waiting room.  It is OK to have someone drop you off and come back when you are ready to be discharged.        Special note: Every effort is made to have your procedure done on time. Please understand that emergencies sometimes delay scheduled procedures.  2. Diet: Do not eat solid foods after midnight.  You may have clear liquids until 5 AM the day of the procedure.  3. Labs: You will need to have blood drawn on Monday, May 6 at [facility]. [address], [place]  Open: 7:30am - 5pm    Phone: [phone]. You do not need to be fasting.  4. Medication instructions in preparation for your procedure:   Contrast Allergy: No  On the morning of your procedure, take Aspirin 81 mg and any morning medicines NOT listed above.  You may use sips of water.  5. Plan to go home the same day, you will only stay overnight if medically necessary. 6. You MUST have a responsible adult to drive you home. 7. An adult MUST be with you the first 24 hours after you arrive home. 8. Bring a current list of your medications, and the last time and date medication taken. 9. Bring ID and current insurance cards. 10.Please wear clothes that are easy to get on and off and wear slip-on shoes.  Thank you for allowing [place] to care for you!   -- [facility]

## 2022-06-27 NOTE — Telephone Encounter (Signed)
Patient is calling because she was wanting to come in on Monday to have her blood work done before her scheduled procedure. The patient's procedure is scheduled for 05/10. Please advise.

## 2022-06-27 NOTE — Telephone Encounter (Signed)
  Patient was contacted this afternoon and reviewed the results of her previous Myoview and also discussed the need for further testing by heart catheterization.  She had all questions answered to her satisfaction and was in agreement to proceed with heart catheterization at this time.  We will reach out to her this afternoon to review her schedule and set her up for a R/LHC due to abnormal Lexiscan and decreased LV function.   Shared Decision Making/Informed Consent The risks [stroke (1 in 1000), death (1 in 1000), kidney failure [usually temporary] (1 in 500), bleeding (1 in 200), allergic reaction [possibly serious] (1 in 200)], benefits (diagnostic support and management of coronary artery disease) and alternatives of a cardiac catheterization were discussed in detail with Carla Sloan and she is willing to proceed.    Robin Searing, NP

## 2022-06-27 NOTE — Telephone Encounter (Signed)
Spoke with patient and wanted to verify what time she needed to come by and do her lab work.   Advised the patient that she can do lbs from 7:15a to 4:45p. Patient agreeable and voiced understanding.

## 2022-06-30 ENCOUNTER — Ambulatory Visit: Payer: Medicare HMO | Attending: Nurse Practitioner

## 2022-06-30 DIAGNOSIS — R9439 Abnormal result of other cardiovascular function study: Secondary | ICD-10-CM

## 2022-06-30 DIAGNOSIS — I519 Heart disease, unspecified: Secondary | ICD-10-CM | POA: Diagnosis not present

## 2022-07-01 LAB — BASIC METABOLIC PANEL WITH GFR
BUN/Creatinine Ratio: 26 (ref 12–28)
BUN: 18 mg/dL (ref 8–27)
CO2: 27 mmol/L (ref 20–29)
Calcium: 9.7 mg/dL (ref 8.7–10.3)
Chloride: 104 mmol/L (ref 96–106)
Creatinine, Ser: 0.7 mg/dL (ref 0.57–1.00)
Glucose: 92 mg/dL (ref 70–99)
Potassium: 4.4 mmol/L (ref 3.5–5.2)
Sodium: 143 mmol/L (ref 134–144)
eGFR: 99 mL/min/1.73

## 2022-07-01 LAB — CBC
Hematocrit: 34 % (ref 34.0–46.6)
Hemoglobin: 10.7 g/dL — ABNORMAL LOW (ref 11.1–15.9)
MCH: 28 pg (ref 26.6–33.0)
MCHC: 31.5 g/dL (ref 31.5–35.7)
MCV: 89 fL (ref 79–97)
Platelets: 288 10*3/uL (ref 150–450)
RBC: 3.82 x10E6/uL (ref 3.77–5.28)
RDW: 13.5 % (ref 11.7–15.4)
WBC: 4.8 10*3/uL (ref 3.4–10.8)

## 2022-07-03 ENCOUNTER — Telehealth: Payer: Self-pay | Admitting: *Deleted

## 2022-07-03 NOTE — Telephone Encounter (Signed)
Cardiac Catheterization scheduled at [facility] for: Friday Jul 04, 2022 9 AM Arrival time [facility] Main Entrance A at: 7 AM  Nothing to eat after midnight prior to procedure, clear liquids until 5 AM day of procedure.  Medication instructions: -Usual morning medications can be taken with sips of water including aspirin 81 mg.  Confirmed patient has responsible adult to drive home post procedure and be with patient first 24 hours after arriving home.  Plan to go home the same day, you will only stay overnight if medically necessary.  Reviewed procedure instructions with patient.

## 2022-07-04 ENCOUNTER — Other Ambulatory Visit: Payer: Self-pay

## 2022-07-04 ENCOUNTER — Encounter (HOSPITAL_COMMUNITY): Payer: Self-pay | Admitting: Cardiology

## 2022-07-04 ENCOUNTER — Ambulatory Visit (HOSPITAL_COMMUNITY)
Admission: RE | Admit: 2022-07-04 | Discharge: 2022-07-04 | Disposition: A | Discharge status: Home / Self Care | Payer: Medicare HMO | Attending: Cardiology | Admitting: Cardiology

## 2022-07-04 ENCOUNTER — Encounter (HOSPITAL_COMMUNITY)
Admission: RE | Disposition: A | Discharge status: Home / Self Care | Payer: Self-pay | Source: Home / Self Care | Attending: Cardiology

## 2022-07-04 DIAGNOSIS — Z8249 Family history of ischemic heart disease and other diseases of the circulatory system: Secondary | ICD-10-CM | POA: Diagnosis not present

## 2022-07-04 DIAGNOSIS — I509 Heart failure, unspecified: Secondary | ICD-10-CM | POA: Diagnosis not present

## 2022-07-04 DIAGNOSIS — Z86718 Personal history of other venous thrombosis and embolism: Secondary | ICD-10-CM | POA: Diagnosis not present

## 2022-07-04 DIAGNOSIS — R000 Tachycardia, unspecified: Secondary | ICD-10-CM | POA: Insufficient documentation

## 2022-07-04 DIAGNOSIS — R9439 Abnormal result of other cardiovascular function study: Secondary | ICD-10-CM | POA: Diagnosis present

## 2022-07-04 DIAGNOSIS — I110 Hypertensive heart disease with heart failure: Secondary | ICD-10-CM | POA: Insufficient documentation

## 2022-07-04 DIAGNOSIS — I272 Pulmonary hypertension, unspecified: Secondary | ICD-10-CM | POA: Insufficient documentation

## 2022-07-04 DIAGNOSIS — Z79899 Other long term (current) drug therapy: Secondary | ICD-10-CM | POA: Insufficient documentation

## 2022-07-04 DIAGNOSIS — I5022 Chronic systolic (congestive) heart failure: Secondary | ICD-10-CM | POA: Insufficient documentation

## 2022-07-04 DIAGNOSIS — Z7901 Long term (current) use of anticoagulants: Secondary | ICD-10-CM | POA: Diagnosis not present

## 2022-07-04 DIAGNOSIS — F32A Depression, unspecified: Secondary | ICD-10-CM | POA: Insufficient documentation

## 2022-07-04 DIAGNOSIS — Z6841 Body Mass Index (BMI) 40.0 and over, adult: Secondary | ICD-10-CM | POA: Diagnosis not present

## 2022-07-04 HISTORY — PX: RIGHT/LEFT HEART CATH AND CORONARY ANGIOGRAPHY: CATH118266

## 2022-07-04 LAB — POCT I-STAT 7, (LYTES, BLD GAS, ICA,H+H)
Acid-Base Excess: 1 mmol/L (ref 0.0–2.0)
Bicarbonate: 27 mmol/L (ref 20.0–28.0)
Calcium, Ion: 1.33 mmol/L (ref 1.15–1.40)
HCT: 33 % — ABNORMAL LOW (ref 36.0–46.0)
Hemoglobin: 11.2 g/dL — ABNORMAL LOW (ref 12.0–15.0)
O2 Saturation: 92 %
Potassium: 3.8 mmol/L (ref 3.5–5.1)
Sodium: 141 mmol/L (ref 135–145)
TCO2: 28 mmol/L (ref 22–32)
pCO2 arterial: 46.5 mmHg (ref 32–48)
pH, Arterial: 7.372 (ref 7.35–7.45)
pO2, Arterial: 66 mmHg — ABNORMAL LOW (ref 83–108)

## 2022-07-04 LAB — POCT I-STAT EG7
Acid-Base Excess: 1 mmol/L (ref 0.0–2.0)
Acid-Base Excess: 2 mmol/L (ref 0.0–2.0)
Bicarbonate: 27 mmol/L (ref 20.0–28.0)
Bicarbonate: 28.4 mmol/L — ABNORMAL HIGH (ref 20.0–28.0)
Calcium, Ion: 1.32 mmol/L (ref 1.15–1.40)
Calcium, Ion: 1.35 mmol/L (ref 1.15–1.40)
HCT: 34 % — ABNORMAL LOW (ref 36.0–46.0)
HCT: 34 % — ABNORMAL LOW (ref 36.0–46.0)
Hemoglobin: 11.6 g/dL — ABNORMAL LOW (ref 12.0–15.0)
Hemoglobin: 11.6 g/dL — ABNORMAL LOW (ref 12.0–15.0)
O2 Saturation: 62 %
O2 Saturation: 63 %
Potassium: 3.7 mmol/L (ref 3.5–5.1)
Potassium: 3.8 mmol/L (ref 3.5–5.1)
Sodium: 142 mmol/L (ref 135–145)
Sodium: 143 mmol/L (ref 135–145)
TCO2: 28 mmol/L (ref 22–32)
TCO2: 30 mmol/L (ref 22–32)
pCO2, Ven: 48.2 mmHg (ref 44–60)
pCO2, Ven: 49.9 mmHg (ref 44–60)
pH, Ven: 7.357 (ref 7.25–7.43)
pH, Ven: 7.362 (ref 7.25–7.43)
pO2, Ven: 34 mmHg (ref 32–45)
pO2, Ven: 35 mmHg (ref 32–45)

## 2022-07-04 SURGERY — RIGHT/LEFT HEART CATH AND CORONARY ANGIOGRAPHY
Anesthesia: LOCAL

## 2022-07-04 MED ORDER — VERAPAMIL HCL 2.5 MG/ML IV SOLN
INTRAVENOUS | Status: DC | PRN
  Administered 2022-07-04: 10 mL via INTRA_ARTERIAL

## 2022-07-04 MED ORDER — ACETAMINOPHEN 325 MG PO TABS: 650.0000 mg | ORAL_TABLET | ORAL | Status: DC | PRN

## 2022-07-04 MED ORDER — SODIUM CHLORIDE 0.9 % IV SOLN: INTRAVENOUS | Status: DC

## 2022-07-04 MED ORDER — SODIUM CHLORIDE 0.9% FLUSH: 3.0000 mL | INTRAVENOUS | Status: DC | PRN

## 2022-07-04 MED ORDER — SODIUM CHLORIDE 0.9 % IV SOLN: 250.0000 mL | INTRAVENOUS | Status: DC | PRN

## 2022-07-04 MED ORDER — LIDOCAINE HCL (PF) 1 % IJ SOLN
INTRAMUSCULAR | Status: AC
  Filled 2022-07-04: qty 30

## 2022-07-04 MED ORDER — ENTRESTO 24-26 MG PO TABS: 2.0000 | ORAL_TABLET | Freq: Two times a day (BID) | ORAL | 2 refills | Status: DC

## 2022-07-04 MED ORDER — HEPARIN SODIUM (PORCINE) 1000 UNIT/ML IJ SOLN
INTRAMUSCULAR | Status: DC | PRN
  Administered 2022-07-04: 6500 [IU] via INTRAVENOUS

## 2022-07-04 MED ORDER — SODIUM CHLORIDE 0.9% FLUSH: 3.0000 mL | Freq: Two times a day (BID) | INTRAVENOUS | Status: DC

## 2022-07-04 MED ORDER — VERAPAMIL HCL 2.5 MG/ML IV SOLN
INTRAVENOUS | Status: AC
  Filled 2022-07-04: qty 2

## 2022-07-04 MED ORDER — SODIUM CHLORIDE 0.9 % WEIGHT BASED INFUSION: 1.0000 mL/kg/h | INTRAVENOUS | Status: DC

## 2022-07-04 MED ORDER — HYDRALAZINE HCL 20 MG/ML IJ SOLN: 10.0000 mg | INTRAMUSCULAR | Status: DC | PRN

## 2022-07-04 MED ORDER — MIDAZOLAM HCL 2 MG/2ML IJ SOLN
INTRAMUSCULAR | Status: AC
  Filled 2022-07-04: qty 2

## 2022-07-04 MED ORDER — FENTANYL CITRATE (PF) 100 MCG/2ML IJ SOLN
INTRAMUSCULAR | Status: AC
  Filled 2022-07-04: qty 2

## 2022-07-04 MED ORDER — MORPHINE SULFATE (PF) 2 MG/ML IV SOLN: 1.0000 mg | INTRAVENOUS | Status: DC | PRN

## 2022-07-04 MED ORDER — MIDAZOLAM HCL 2 MG/2ML IJ SOLN
INTRAMUSCULAR | Status: DC | PRN
  Administered 2022-07-04: 1 mg via INTRAVENOUS

## 2022-07-04 MED ORDER — LIDOCAINE HCL (PF) 1 % IJ SOLN
INTRAMUSCULAR | Status: DC | PRN
  Administered 2022-07-04 (×2): 2 mL via INTRADERMAL

## 2022-07-04 MED ORDER — IOHEXOL 350 MG/ML SOLN
INTRAVENOUS | Status: DC | PRN
  Administered 2022-07-04: 75 mL

## 2022-07-04 MED ORDER — LABETALOL HCL 5 MG/ML IV SOLN: 10.0000 mg | INTRAVENOUS | Status: DC | PRN

## 2022-07-04 MED ORDER — HEPARIN (PORCINE) IN NACL 1000-0.9 UT/500ML-% IV SOLN
INTRAVENOUS | Status: DC | PRN
  Administered 2022-07-04 (×2): 500 mL

## 2022-07-04 MED ORDER — HEPARIN SODIUM (PORCINE) 1000 UNIT/ML IJ SOLN
INTRAMUSCULAR | Status: AC
  Filled 2022-07-04: qty 10

## 2022-07-04 MED ORDER — ONDANSETRON HCL 4 MG/2ML IJ SOLN: 4.0000 mg | Freq: Four times a day (QID) | INTRAMUSCULAR | Status: DC | PRN

## 2022-07-04 MED ORDER — ASPIRIN 81 MG PO CHEW: 81.0000 mg | CHEWABLE_TABLET | ORAL | Status: DC

## 2022-07-04 MED ORDER — SODIUM CHLORIDE 0.9 % WEIGHT BASED INFUSION
3.0000 mL/kg/h | INTRAVENOUS | Status: AC
  Administered 2022-07-04: 3 mL/kg/h via INTRAVENOUS

## 2022-07-04 MED ORDER — FUROSEMIDE 10 MG/ML IJ SOLN
40.0000 mg | Freq: Once | INTRAMUSCULAR | Status: AC
  Administered 2022-07-04: 40 mg via INTRAVENOUS
  Filled 2022-07-04: qty 4

## 2022-07-04 MED ORDER — FENTANYL CITRATE (PF) 100 MCG/2ML IJ SOLN
INTRAMUSCULAR | Status: DC | PRN
  Administered 2022-07-04: 25 ug via INTRAVENOUS

## 2022-07-04 SURGICAL SUPPLY — 14 items
CATH BALLN WEDGE 5F 110CM (CATHETERS) IMPLANT
CATH INFINITI 5 FR JL3.5 (CATHETERS) IMPLANT
CATH INFINITI JR4 5F (CATHETERS) IMPLANT
CATH OPTITORQUE TIG 4.0 5F (CATHETERS) IMPLANT
DEVICE RAD COMP TR BAND LRG (VASCULAR PRODUCTS) IMPLANT
GLIDESHEATH SLEND SS 6F .021 (SHEATH) IMPLANT
GUIDEWIRE INQWIRE 1.5J.035X260 (WIRE) IMPLANT
INQWIRE 1.5J .035X260CM (WIRE) ×1
KIT HEART LEFT (KITS) ×1 IMPLANT
PACK CARDIAC CATHETERIZATION (CUSTOM PROCEDURE TRAY) ×1 IMPLANT
SHEATH GLIDE SLENDER 4/5FR (SHEATH) IMPLANT
SYR MEDRAD MARK 7 150ML (SYRINGE) ×1 IMPLANT
TRANSDUCER W/STOPCOCK (MISCELLANEOUS) ×1 IMPLANT
TUBING CIL FLEX 10 FLL-RA (TUBING) ×1 IMPLANT

## 2022-07-04 NOTE — Interval H&P Note (Signed)
History and Physical Interval Note:  07/04/2022 8:54 AM  Patient was contacted this afternoon and reviewed the results of her previous Myoview and also discussed the need for further testing by heart catheterization.  She had all questions answered to her satisfaction and was in agreement to proceed with heart catheterization at this time.  We will reach out to her this afternoon to review her schedule and set her up for a R/LHC due to abnormal Lexiscan and decreased LV function.     Shared Decision Making/Informed Consent The risks [stroke (1 in 1000), death (1 in 1000), kidney failure [usually temporary] (1 in 500), bleeding (1 in 200), allergic reaction [possibly serious] (1 in 200)], benefits (diagnostic support and management of coronary artery disease) and alternatives of a cardiac catheterization were discussed in detail with Carla Sloan and she is willing to proceed.      Robin Searing, NP  Carla Sloan  has presented today for Cardiac Catheterization, with the diagnosis of ABNORMAL STRESS TEST.  The various methods of treatment have been discussed with the patient and family. After consideration of risks, benefits and other options for treatment, the patient has consented to  Procedure(s): RIGHT/LEFT HEART CATH AND CORONARY ANGIOGRAPHY (N/A)  PERCUTANEOUS CORONARY INTERVENTION   as a surgical intervention.  The patient's history has been reviewed, patient examined, no change in status, stable for surgery.  I have reviewed the patient's chart and labs.  Questions were answered to the patient's satisfaction.     Cath Lab Visit (complete for each Cath Lab visit)  Clinical Evaluation Leading to the Procedure:   ACS: No.  Non-ACS:    Anginal Classification: No Symptoms  Anti-ischemic medical therapy: Minimal Therapy (1 class of medications)  Non-Invasive Test Results: Intermediate-risk stress test findings: cardiac mortality 1-3%/year  Prior CABG: No previous  CABG    Bryan Lemma

## 2022-07-04 NOTE — Discharge Instructions (Signed)
Brachial Site Care   This sheet gives you information about how to care for yourself after your procedure. Your health care provider may also give you more specific instructions. If you have problems or questions, contact your health care provider. What can I expect after the procedure? After the procedure, it is common to have: Bruising and tenderness at the catheter insertion area. Follow these instructions at home:  Insertion site care Follow instructions from your health care provider about how to take care of your insertion site. Make sure you: Wash your hands with soap and water before you change your bandage (dressing). If soap and water are not available, use hand sanitizer. Remove your dressing as told by your health care provider. In 24 hours Check your insertion site every day for signs of infection. Check for: Redness, swelling, or pain. Pus or a bad smell. Warmth. You may shower 24-48 hours after the procedure. Do not apply powder or lotion to the site.  Activity For 24 hours after the procedure, or as directed by your health care provider: Do not push or pull heavy objects with the affected arm. Do not drive yourself home from the hospital or clinic. You may drive 24 hours after the procedure unless your health care provider tells you not to. Do not lift anything that is heavier than 10 lb (4.5 kg), or the limit that you are told, until your health care provider says that it is safe.  For 24 hours    Radial Site Care  This sheet gives you information about how to care for yourself after your procedure. Your health care provider may also give you more specific instructions. If you have problems or questions, contact your health care provider. What can I expect after the procedure? After the procedure, it is common to have: Bruising and tenderness at the catheter insertion area. Follow these instructions at home: Medicines Take over-the-counter and prescription  medicines only as told by your health care provider. Insertion site care Follow instructions from your health care provider about how to take care of your insertion site. Make sure you: Wash your hands with soap and water before you remove your bandage (dressing). If soap and water are not available, use hand sanitizer. May remove dressing in 24 hours. Check your insertion site every day for signs of infection. Check for: Redness, swelling, or pain. Fluid or blood. Pus or a bad smell. Warmth. Do no take baths, swim, or use a hot tub for 5 days. You may shower 24-48 hours after the procedure. Remove the dressing and gently wash the site with plain soap and water. Pat the area dry with a clean towel. Do not rub the site. That could cause bleeding. Do not apply powder or lotion to the site. Activity  For 24 hours after the procedure, or as directed by your health care provider: Do not flex or bend the affected arm. Do not push or pull heavy objects with the affected arm. Do not drive yourself home from the hospital or clinic. You may drive 24 hours after the procedure. Do not operate machinery or power tools. KEEP ARM ELEVATED THE REMAINDER OF THE DAY. Do not push, pull or lift anything that is heavier than 10 lb for 5 days. Ask your health care provider when it is okay to: Return to work or school. Resume usual physical activities or sports. Resume sexual activity. General instructions If the catheter site starts to bleed, raise your arm and put firm pressure  on the site. If the bleeding does not stop, get help right away. This is a medical emergency. DRINK PLENTY OF FLUIDS FOR THE NEXT 2-3 DAYS. No alcohol consumption for 24 hours after receiving sedation. If you went home on the same day as your procedure, a responsible adult should be with you for the first 24 hours after you arrive home. Keep all follow-up visits as told by your health care provider. This is important. Contact a  health care provider if: You have a fever. You have redness, swelling, or yellow drainage around your insertion site. Get help right away if: You have unusual pain at the radial site. The catheter insertion area swells very fast. The insertion area is bleeding, and the bleeding does not stop when you hold steady pressure on the area. Your arm or hand becomes pale, cool, tingly, or numb. These symptoms may represent a serious problem that is an emergency. Do not wait to see if the symptoms will go away. Get medical help right away. Call your local emergency services (911 in the [place] Do not drive yourself to the hospital. Summary After the procedure, it is common to have bruising and tenderness at the site. Follow instructions from your health care provider about how to take care of your radial site wound. Check the wound every day for signs of infection.  This information is not intended to replace advice given to you by your health care provider. Make sure you discuss any questions you have with your health care provider. Document Revised: 03/18/2017 Document Reviewed: 03/18/2017 Elsevier Patient Education  2020 [employer].

## 2022-07-07 NOTE — Progress Notes (Unsigned)
Office Visit    Patient Name: Carla Sloan Date of Encounter: 07/07/2022  Primary Care Provider:  Avanell Shackleton, NP-C Primary Cardiologist:  None Primary Electrophysiologist: None   Past Medical History    Past Medical History:  Diagnosis Date   Arthritis    Depression    Hypertension    Muscle spasms of lower extremity    and back   Tachycardia    Past Surgical History:  Procedure Laterality Date   ABDOMINOPLASTY/PANNICULECTOMY WITH LIPOSUCTION N/A 03/19/2022   Procedure: PANNICULECTOMY WITH LIPOSUCTION;  Surgeon: Peggye Form, DO;  Location: [facility];  Service: Plastics;  Laterality: N/A;   COLONOSCOPY WITH PROPOFOL N/A 09/24/2012   Procedure: COLONOSCOPY WITH PROPOFOL;  Surgeon: Petra Kuba, MD;  Location: [facility];  Service: Endoscopy;  Laterality: N/A;   COLONOSCOPY WITH PROPOFOL N/A 05/19/2016   Procedure: COLONOSCOPY WITH PROPOFOL;  Surgeon: Vida Rigger, MD;  Location: [facility] ENDOSCOPY;  Service: Endoscopy;  Laterality: N/A;   LAPAROSCOPIC GASTRIC SLEEVE RESECTION N/A 04/24/2020   Procedure: LAPAROSCOPIC GASTRIC SLEEVE RESECTION;  Surgeon: Berna Bue, MD;  Location: [facility];  Service: General;  Laterality: N/A;   NO PAST SURGERIES     RIGHT/LEFT HEART CATH AND CORONARY ANGIOGRAPHY N/A 07/04/2022   Procedure: RIGHT/LEFT HEART CATH AND CORONARY ANGIOGRAPHY;  Surgeon: Marykay Lex, MD;  Location: [facility] INVASIVE CV LAB;  Service: Cardiovascular;  Laterality: N/A;   TUBAL LIGATION     UPPER GI ENDOSCOPY N/A 04/24/2020   Procedure: UPPER GI ENDOSCOPY;  Surgeon: Berna Bue, MD;  Location: [facility];  Service: General;  Laterality: N/A;    Allergies  No Known Allergies   History of Present Illness    Carla Sloan is a 62 y.o. 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  Carla Sloan was seen initially by Dr. Anne Fu on 04/30/2022 for complaint of sinus tachycardia. She underwent a annulectomy 3 weeks prior to ED visit. She required transfusion for anemia following procedure. During ED visit pulse was 153 with a narrow complex sinus tachycardia noted. CT of the chest was completed and was negative. She was started on metoprolol 50 mg twice daily and 2D echo was completed that showed EF of 45-50% with mildly decreased LV function, moderately dilated LA and mildly dilated RA.  She was seen on 06/09/2022 for follow-up.  During visit patient was euvolemic and blood pressure was well-controlled.  She denied any shortness of breath and reported no recurrence of palpitations.  She underwent Myoview stress test that showed possible infarct with intermediate risk and poor blood flow and myocardium.  She was scheduled for [facility] that was completed on 06/2022 that revealed normal coronaries and moderately elevated LV end-diastolic pressure.  She had preserved cardiac output index patient was treated with IV Lasix post heart cath.  She was found to have WHO group 2 pulmonary HTN.  Since last being seen in the office patient reports***.  Patient denies chest pain, palpitations, dyspnea, PND, orthopnea, nausea, vomiting, dizziness, syncope, edema, weight gain, or early satiety.     ***Notes:  Home Medications    Current Outpatient Medications  Medication Sig Dispense Refill   acetaminophen (TYLENOL) 500 MG tablet Take 1 tablet (500 mg total) by mouth every 6 (six) hours as needed. (Patient taking differently: Take 500 mg by mouth every 6 (  six) hours as needed for mild pain, fever or headache.) 30 tablet 0   bimatoprost (LUMIGAN) 0.03 % ophthalmic solution Place 1 drop into both eyes at bedtime.     brimonidine-timolol (COMBIGAN) 0.2-0.5 % ophthalmic solution Place 1 drop into both eyes every 12 (twelve) hours.     calcium citrate-vitamin D (CELEBRATE CALCIUM CITRATE)  500-500 MG-UNIT chewable tablet Chew 1 tablet by mouth every evening.     citalopram (CELEXA) 40 MG tablet Take 40 mg by mouth daily.     cyclobenzaprine (FLEXERIL) 10 MG tablet Take 10 mg by mouth 3 (three) times daily.     Elastic Bandages & Supports (WRIST SPLINT/COCK-UP/LEFT L) MISC Wear nightly and with exacerbating activities. 1 each 0   Elastic Bandages & Supports (WRIST SPLINT/COCK-UP/RIGHT L) MISC Wear nightly and with exacerbating activities. 1 each 0   ferrous sulfate 324 MG TBEC Take 324 mg by mouth daily with breakfast.     lidocaine (LIDODERM) 5 % Place 1 patch onto the skin daily. Remove & Discard patch within 12 hours or as directed by MD (Patient not taking: Reported on 07/02/2022) 30 patch 0   Lurasidone HCl (LATUDA) 60 MG TABS Take 60 mg by mouth at bedtime.     metoprolol tartrate (LOPRESSOR) 50 MG tablet Take 1 tablet (50 mg total) by mouth 2 (two) times daily. 180 tablet 3   Multiple Vitamins-Minerals (CELEBRATE MULTI-COMPLETE 45) CHEW Chew 1 each by mouth 3 (three) times daily.     ondansetron (ZOFRAN) 4 MG tablet Take 1 tablet (4 mg total) by mouth every 8 (eight) hours as needed for nausea or vomiting. (Patient not taking: Reported on 07/02/2022) 20 tablet 0   orphenadrine (NORFLEX) 100 MG tablet Take 1 tablet (100 mg total) by mouth 2 (two) times daily. (Patient not taking: Reported on 07/02/2022) 30 tablet 0   oxyCODONE-acetaminophen (PERCOCET) 10-325 MG tablet Take 1 tablet by mouth twice a day as needed. (Patient taking differently: Take 1 tablet by mouth 3 (three) times daily as needed for pain.) 60 tablet 0   pravastatin (PRAVACHOL) 20 MG tablet Take 1 tablet (20 mg total) by mouth daily. 90 tablet 1   RESTASIS 0.05 % ophthalmic emulsion Place 1 drop into both eyes 2 (two) times daily.     sacubitril-valsartan (ENTRESTO) 24-26 MG Take 2 tablets by mouth 2 (two) times daily. Take existing Rx 2 tab 2 x daily until seen in Cardiology f/u - will replace Rx with higher dose @ f/u  60 tablet 2   No current facility-administered medications for this visit.     Review of Systems  Please see the history of present illness.    (+)*** (+)***  All other systems reviewed and are otherwise negative except as noted above.  Physical Exam    Wt Readings from Last 3 Encounters:  07/04/22 280 lb (127 kg)  06/27/22 281 lb 12.8 oz (127.8 kg)  06/23/22 281 lb (127.5 kg)   ZO:XWRUE were no vitals filed for this visit.,There is no height or weight on file to calculate BMI.  Constitutional:      Appearance: Healthy appearance. Not in distress.  Neck:     Vascular: JVD normal.  Pulmonary:     Effort: Pulmonary effort is normal.     Breath sounds: No wheezing. No rales. Diminished in the bases Cardiovascular:     Normal rate. Regular rhythm. Normal S1. Normal S2.      Murmurs: There is no murmur.  Edema:  Peripheral edema absent.  Abdominal:     Palpations: Abdomen is soft non tender. There is no hepatomegaly.  Skin:    General: Skin is warm and dry.  Neurological:     General: No focal deficit present.     Mental Status: Alert and oriented to person, place and time.     Cranial Nerves: Cranial nerves are intact.  EKG/LABS/ Recent Cardiac Studies    ECG personally reviewed by me today - ***  Cardiac Studies & Procedures   CARDIAC CATHETERIZATION  CARDIAC CATHETERIZATION 07/04/2022  Narrative   LV end diastolic pressure is moderately elevated.   Hemodynamic findings consistent with mild pulmonary hypertension.   There is no aortic valve stenosis.  POST-CATH DIAGNOSES Angiographically Normal Coronary Arteries: FALSE POSITIVE MYOVIEW MILD-MODERATE PULMONARY HYPERTENSION -> due to elevated LVEDP (WHO Group 2): Mean PA 32 mmHg, PCWP 21 mmHg, LVEDP 23 mmHg with Systemic Hypertension Preserved Cardiac Output-Index (6.95-3.05)   RECOMMENDATIONS Given elevated pressures here today we will give IV Lasix in addition to post-cath hydration. SPECT that she will  probably need diuretic in the outpatient setting, but for now we will increase dose of Entresto given hypotension on the table.  Increased to 2 tablets twice daily until seen in follow-up. For future ischemic evaluation would recommend avoiding Myoview stress test given false positive results. 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 Vessel is large. Vessel is angiographically normal.  Left Anterior Descending Vessel was injected. Vessel is normal in caliber. Vessel is angiographically normal.  First Diagonal Branch Vessel was injected. Vessel is small in size. Vessel is angiographically normal.  Lateral Second Diagonal Branch Vessel was injected. Vessel is small in size. Vessel is angiographically normal.  Left Circumflex Vessel was injected. Vessel is normal in caliber and moderate in size. Vessel is angiographically normal.  Right Coronary Artery Vessel was injected. Vessel is large. Vessel is angiographically normal. The vessel is moderately tortuous.  Acute Marginal Branch Vessel was injected. Vessel is small in size. Vessel is angiographically normal.  Right Ventricular Branch Vessel was injected. Vessel is small in size. Vessel is angiographically normal.  Right Posterior Descending Artery Vessel was injected. Vessel is small in size. Vessel is angiographically normal.  Right Posterior Atrioventricular Artery Vessel was injected. Vessel is moderate in size. Vessel is angiographically normal.  Intervention  No interventions have been documented.   STRESS TESTS  MYOCARDIAL PERFUSION IMAGING 06/24/2022  Narrative   Findings are consistent with infarction with peri-infarct ischemia. Findings are equivocal. The study is intermediate risk.   No ST deviation was noted.   LV perfusion is abnormal. Defect 1: There is a large defect with severe  reduction in uptake present in the apical to mid anterior, anterolateral, anteroseptal, inferior and apex location(s) that is partially reversible. Viability is present. There is abnormal wall motion in the defect area. Consistent with artifact caused by subdiaphragmatic activity.   Left ventricular function is abnormal. Global function is severely reduced. There were multiple regional abnormalities. Nuclear stress EF: 27 %. The left ventricular ejection fraction is severely decreased (<30%). End diastolic cavity size is mildly enlarged. End systolic cavity size is mildly enlarged.  Images demonstrate a large size, severe severity partially reversible apical, anteroapical, inferoapical, apical septal, apical lateral and mid anterior wall perfusion defect which is partially reversible, suggestive of either infarct with peri-infarct ischemia or possibly a severe anterior wall  territory perfusion defect with low resting blood flow.  It was also noted that the patient was found to have an elevated hemi-diaphragm and rightward shift of heart, but not dextrocardia or situs inversus per echo and CT. These findings may have adversely affected the ability of the camera to adequately image the myocardium. The reported LVEF of 27% may be falsely low (given recent echo findings). This is considered a high risk study, however, would consider an alternative ischemia evaluation or definitive coronary evaluation if indicated. No prior for comparison.   ECHOCARDIOGRAM  ECHOCARDIOGRAM COMPLETE 05/28/2022  Narrative ECHOCARDIOGRAM REPORT    Patient Name:   [facility] Date of Exam: 05/28/2022 Medical Rec #:  [id number]                Height:       65.0 in Accession #:    [id number]               Weight:       270.6 lb Date of Birth:  1960/09/03                BSA:          2.249 m Patient Age:    61 years                 BP:           123/82 mmHg Patient Gender: F                        HR:           97  bpm. Exam Location:  [address]  Procedure: 2D Echo, Cardiac Doppler and Color Doppler  Indications:    R94.31 Abnormal EKG  History:        Patient has no prior history of Echocardiogram examinations. Abnormal ECG, Arrythmias:Tachycardia, Signs/Symptoms:Shortness of Breath; Risk Factors:Hypertension. Marked elevation of left hemidiaphragm noted on CT, relative rightward shift of heart and imaging windows- GAA MD.  Sonographer:    Chanetta Marshall BA, RDCS Referring Phys: 82 MARK C SKAINS   Sonographer Comments: Technically difficult study due to poor echo windows and patient is obese. Image acquisition challenging due to patient body habitus. IMPRESSIONS   1. Left ventricular ejection fraction, by estimation, is 45 to 50%. The left ventricle has mildly decreased function. The left ventricle demonstrates global hypokinesis. The left ventricular internal cavity size was mildly dilated. Left ventricular diastolic parameters are indeterminate. 2. Right ventricular systolic function is normal. The right ventricular size is normal. There is mildly elevated pulmonary artery systolic pressure. The estimated right ventricular systolic pressure is 36.9 mmHg. 3. Left atrial size was moderately dilated. 4. Right atrial size was mildly dilated. 5. The mitral valve is grossly normal. Trivial mitral valve regurgitation. No evidence of mitral stenosis. 6. The aortic valve is tricuspid. Aortic valve regurgitation is not visualized. No aortic stenosis is present. 7. The inferior vena cava is dilated in size with >50% respiratory variability, suggesting right atrial pressure of 8 mmHg.  FINDINGS Left Ventricle: Left ventricular ejection fraction, by estimation, is 45 to 50%. The left ventricle has mildly decreased function. The left ventricle demonstrates global hypokinesis. The left ventricular internal cavity size was mildly dilated. There is no left ventricular hypertrophy. Left ventricular  diastolic parameters are indeterminate.  Right Ventricle: The right ventricular size is normal. No increase in right ventricular wall thickness. Right ventricular systolic function is normal. There is mildly elevated  pulmonary artery systolic pressure. The tricuspid regurgitant velocity is 2.69 m/s, and with an assumed right atrial pressure of 8 mmHg, the estimated right ventricular systolic pressure is 36.9 mmHg.  Left Atrium: Left atrial size was moderately dilated.  Right Atrium: Right atrial size was mildly dilated.  Pericardium: There is no evidence of pericardial effusion.  Mitral Valve: The mitral valve is grossly normal. Trivial mitral valve regurgitation. No evidence of mitral valve stenosis.  Tricuspid Valve: The tricuspid valve is normal in structure. Tricuspid valve regurgitation is trivial. No evidence of tricuspid stenosis.  Aortic Valve: The aortic valve is tricuspid. Aortic valve regurgitation is not visualized. No aortic stenosis is present.  Pulmonic Valve: The pulmonic valve was normal in structure. Pulmonic valve regurgitation is mild. No evidence of pulmonic stenosis.  Aorta: The aortic root is normal in size and structure.  Venous: The inferior vena cava is dilated in size with greater than 50% respiratory variability, suggesting right atrial pressure of 8 mmHg.  IAS/Shunts: No atrial level shunt detected by color flow Doppler.   LEFT VENTRICLE PLAX 2D LVIDd:         5.80 cm   Diastology LVIDs:         4.60 cm   LV e' medial:    6.09 cm/s LV PW:         0.80 cm   LV E/e' medial:  12.8 LV IVS:        0.70 cm   LV e' lateral:   7.62 cm/s LVOT diam:     2.50 cm   LV E/e' lateral: 10.2 LV SV:         88 LV SV Index:   39 LVOT Area:     4.91 cm   RIGHT VENTRICLE            IVC RV S prime:     7.62 cm/s  IVC diam: 1.40 cm TAPSE (M-mode): 2.8 cm RVSP:           31.9 mmHg  LEFT ATRIUM              Index        RIGHT ATRIUM LA diam:        3.60 cm  1.60 cm/m    RA Pressure: 3.00 mmHg LA Vol (A2C):   117.0 ml 52.02 ml/m LA Vol (A4C):   106.0 ml 47.12 ml/m LA Biplane Vol: 114.0 ml 50.68 ml/m AORTIC VALVE LVOT Vmax:   85.00 cm/s LVOT Vmean:  57.900 cm/s LVOT VTI:    0.180 m  AORTA Ao Root diam: 3.50 cm  MITRAL VALVE               TRICUSPID VALVE MV Area (PHT): 3.80 cm    TR Peak grad:   28.9 mmHg MV Decel Time: 200 msec    TR Vmax:        269.00 cm/s MV E velocity: 78.00 cm/s  Estimated RAP:  3.00 mmHg MV A velocity: 93.80 cm/s  RVSP:           31.9 mmHg MV E/A ratio:  0.83 SHUNTS Systemic VTI:  0.18 m Systemic Diam: 2.50 cm  Weston Brass MD Electronically signed by Weston Brass MD Signature Date/Time: 05/28/2022/11:03:24 PM    Final             Risk Assessment/Calculations:   {Does this patient have ATRIAL FIBRILLATION?:[phone]}        Lab Results  Component Value Date   WBC 4.8  06/30/2022   HGB 11.6 (L) 07/04/2022   HGB 11.6 (L) 07/04/2022   HCT 34.0 (L) 07/04/2022   HCT 34.0 (L) 07/04/2022   MCV 89 06/30/2022   PLT 288 06/30/2022   Lab Results  Component Value Date   CREATININE 0.70 06/30/2022   BUN 18 06/30/2022   NA 143 07/04/2022   NA 142 07/04/2022   K 3.8 07/04/2022   K 3.7 07/04/2022   CL 104 06/30/2022   CO2 27 06/30/2022   Lab Results  Component Value Date   ALT 14 04/11/2022   AST 23 04/11/2022   ALKPHOS 80 04/11/2022   BILITOT 0.2 (L) 04/11/2022   Lab Results  Component Value Date   CHOL 163 01/09/2022   HDL 62.00 01/09/2022   LDLCALC 91 01/09/2022   TRIG 51.0 01/09/2022   CHOLHDL 3 01/09/2022    No results found for: "HGBA1C"   Assessment & Plan     1.HFmrEF: -2D echo recently completed showing EF of 45-50% -Today patient reports no episodes of shortness of breath or lower extremity swelling. -We will have patient complete a exercise Myoview to evaluate for ischemic cause of newly discovered decreased LV function. -We will start her on Entresto 24/26 mg twice daily  with plan to titrate GDMT further. -BMET in 2 weeks -Low sodium diet, fluid restriction <2L, and daily weights encouraged. Educated to contact our office for weight gain of 2 lbs overnight or 5 lbs in one week.    2.  Nonobstructive CAD: -R/LHC performed that showed normal coronary arteries -Today patient reports***   3. Pulmonary HTN: -Patient underwent R/LHC that showed elevated LVEDP with preserved cardiac index -Today patient is***   4.  Family history of heart disease: -Patient reports that one of her brothers has a pacemaker and the other suffers from heart failure. -Patient encouraged to abstain from excess salt and cholesterol in her diet.  5.History DVT: -She was previously on Entresto for history of DVT.  She denies any recurrence of shortness of breath or lower extremity swelling.      Disposition: Follow-up with None or APP in *** months {Are you ordering a CV Procedure (e.g. stress test, cath, DCCV, TEE, etc)?   Press F2        :[id number]}   Medication Adjustments/Labs and Tests Ordered: Current medicines are reviewed at length with the patient today.  Concerns regarding medicines are outlined above.   Signed, Napoleon Form, Leodis Rains, NP 07/07/2022, 7:47 PM [facility]

## 2022-07-08 ENCOUNTER — Ambulatory Visit: Payer: Medicare HMO | Attending: Nurse Practitioner

## 2022-07-08 ENCOUNTER — Ambulatory Visit: Payer: Medicare HMO | Attending: Nurse Practitioner | Admitting: Nurse Practitioner

## 2022-07-08 ENCOUNTER — Encounter: Payer: Self-pay | Admitting: Nurse Practitioner

## 2022-07-08 VITALS — BP 118/84 | HR 103 | Ht 65.0 in | Wt 289.0 lb

## 2022-07-08 DIAGNOSIS — I2510 Atherosclerotic heart disease of native coronary artery without angina pectoris: Secondary | ICD-10-CM

## 2022-07-08 DIAGNOSIS — R000 Tachycardia, unspecified: Secondary | ICD-10-CM

## 2022-07-08 DIAGNOSIS — Z86718 Personal history of other venous thrombosis and embolism: Secondary | ICD-10-CM

## 2022-07-08 DIAGNOSIS — I272 Pulmonary hypertension, unspecified: Secondary | ICD-10-CM | POA: Diagnosis not present

## 2022-07-08 DIAGNOSIS — Z8249 Family history of ischemic heart disease and other diseases of the circulatory system: Secondary | ICD-10-CM

## 2022-07-08 DIAGNOSIS — I5022 Chronic systolic (congestive) heart failure: Secondary | ICD-10-CM

## 2022-07-08 MED ORDER — ENTRESTO 49-51 MG PO TABS: 1.0000 | ORAL_TABLET | Freq: Two times a day (BID) | ORAL | 3 refills | Status: DC

## 2022-07-08 MED ORDER — SPIRONOLACTONE 25 MG PO TABS: 12.5000 mg | ORAL_TABLET | Freq: Every day | ORAL | 3 refills | Status: DC

## 2022-07-08 MED ORDER — BLOOD PRESSURE CUFF MISC
1.0000 | Freq: Every day | 0 refills | Status: AC
Start: 2022-07-08 — End: ?

## 2022-07-08 MED ORDER — METOPROLOL TARTRATE 50 MG PO TABS: 50.0000 mg | ORAL_TABLET | Freq: Two times a day (BID) | ORAL | 3 refills | Status: DC

## 2022-07-08 NOTE — Progress Notes (Unsigned)
ZIO XT serial # [id number] from office inventory applied to patient.  Dr. Anne Fu to read.

## 2022-07-08 NOTE — Patient Instructions (Addendum)
Medication Instructions:  INCREASE Entresto to 49/51mg  Take 1 tablet twice a day  INCREASE Metoprolol can take an additional tablet if heart rate is over 100. START Spironolactone 12.5mg  Break 25mg  tablet in half and take half tablet daily *If you need a refill on your cardiac medications before your next appointment, please call your pharmacy*   Lab Work: BMET IN 1 WEEK If you have labs (blood work) drawn today and your tests are completely normal, you will receive your results only by: MyChart Message (if you have MyChart) OR A paper copy in the mail If you have any lab test that is abnormal or we need to change your treatment, we will call you to review the results  Testing/Procedures: ZIO XT- Long Term Monitor Instructions  Your physician has requested you wear a ZIO patch monitor for 14 days.  This is a single patch monitor. [employer] supplies one patch monitor per enrollment. Additional stickers are not available. Please do not apply patch if you will be having a Nuclear Stress Test,  Echocardiogram, Cardiac CT, MRI, or Chest Xray during the period you would be wearing the  monitor. The patch cannot be worn during these tests. You cannot remove and re-apply the  ZIO XT patch monitor.  Your ZIO patch monitor will be mailed 3 day USPS to your address on file. It may take 3-5 days  to receive your monitor after you have been enrolled.  Once you have received your monitor, please review the enclosed instructions. Your monitor  has already been registered assigning a specific monitor serial # to you.  Billing and Patient Assistance Program Information  We have supplied [employer] with any of your insurance information on file for billing purposes. [employer] offers a sliding scale Patient Assistance Program for patients that do not have  insurance, or whose insurance does not completely cover the cost of the ZIO monitor.  You must apply for the Patient Assistance Program to qualify for this  discounted rate.  To apply, please call [employer] at [phone], select option 4, select option 2, ask to apply for  Patient Assistance Program. Meredeth Ide will ask your household income, and how many people  are in your household. They will quote your out-of-pocket cost based on that information.  [employer] will also be able to set up a 56-month, interest-free payment plan if needed.  Applying the monitor   Shave hair from upper left chest.  Hold abrader disc by orange tab. Rub abrader in 40 strokes over the upper left chest as  indicated in your monitor instructions.  Clean area with 4 enclosed alcohol pads. Let dry.  Apply patch as indicated in monitor instructions. Patch will be placed under collarbone on left  side of chest with arrow pointing upward.  Rub patch adhesive wings for 2 minutes. Remove white label marked "1". Remove the white  label marked "2". Rub patch adhesive wings for 2 additional minutes.  While looking in a mirror, press and release button in center of patch. A small green light will  flash 3-4 times. This will be your only indicator that the monitor has been turned on.  Do not shower for the first 24 hours. You may shower after the first 24 hours.  Press the button if you feel a symptom. You will hear a small click. Record Date, Time and  Symptom in the Patient Logbook.  When you are ready to remove the patch, follow instructions on the last 2 pages of Patient  Logbook.  Stick patch monitor onto the last page of Patient Logbook.  Place Patient Logbook in the blue and white box. Use locking tab on box and tape box closed  securely. The blue and white box has prepaid postage on it. Please place it in the mailbox as  soon as possible. Your physician should have your test results approximately 7 days after the  monitor has been mailed back to [facility].  Call [facility] Customer Care at [phone] if you have questions regarding  your ZIO XT patch monitor. Call  them immediately if you see an orange light blinking on your  monitor.  If your monitor falls off in less than 4 days, contact our Monitor department at [phone].  If your monitor becomes loose or falls off after 4 days call [employer] at [phone] for  suggestions on securing your monitor   Follow-Up: At [facility], you and your health needs are our priority.  As part of our continuing mission to provide you with exceptional heart care, we have created designated Provider Care Teams.  These Care Teams include your primary Cardiologist (physician) and Advanced Practice Providers (APPs -  Physician Assistants and Nurse Practitioners) who all work together to provide you with the care you need, when you need it.  We recommend signing up for the patient portal called "MyChart".  Sign up information is provided on this After Visit Summary.  MyChart is used to connect with patients for Virtual Visits (Telemedicine).  Patients are able to view lab/test results, encounter notes, upcoming appointments, etc.  Non-urgent messages can be sent to your provider as well.   To learn more about what you can do with MyChart, go to [email or web address].    Your next appointment:   3 month(s)  Provider:   Robin Searing, NP      Other Instructions

## 2022-07-10 ENCOUNTER — Ambulatory Visit (INDEPENDENT_AMBULATORY_CARE_PROVIDER_SITE_OTHER): Payer: Medicare HMO | Admitting: Family Medicine

## 2022-07-10 ENCOUNTER — Encounter: Payer: Self-pay | Admitting: Family Medicine

## 2022-07-10 VITALS — BP 126/86 | HR 70 | Temp 97.6°F | Ht 65.0 in | Wt 289.0 lb

## 2022-07-10 DIAGNOSIS — M170 Bilateral primary osteoarthritis of knee: Secondary | ICD-10-CM

## 2022-07-10 DIAGNOSIS — G894 Chronic pain syndrome: Secondary | ICD-10-CM

## 2022-07-10 DIAGNOSIS — E785 Hyperlipidemia, unspecified: Secondary | ICD-10-CM | POA: Diagnosis not present

## 2022-07-10 NOTE — Patient Instructions (Signed)
It was good to see you today.  Continue seeing your specialist and your current medications as recommended.  I will see you back in 1 year or sooner if needed.

## 2022-07-10 NOTE — Addendum Note (Signed)
Addended byDurenda Hurt on: 07/10/2022 09:43 AM   Modules accepted: Orders

## 2022-07-10 NOTE — Progress Notes (Signed)
Subjective:     Patient ID: Carla Sloan, female    DOB: [birth year], 61 y.o.   MRN: [id number]  Chief Complaint  Patient presents with   Medical Management of Chronic Issues    6 month f/u  Renew handicap in July     HPI Patient is in today for a 6 month follow up. Reports feeling well today. She is under the care of several specialists.  Request handicap placard due to bilateral knee arthritis and unable to walk long distances due to pain. She is hoping to have knee replacement in the future.   Cardiologist recently made changes to her medications. Recent heart cath.  She is wearing a heart monitor today.   Other providers:  Cardiologist-  OB/GYN GI Deboraha Sprang  Orthopedist Dietician  Pain - Dr. Ethelene Hal  Eyes- Dr. Dione Booze  Psychiatrist - Vesta Mixer    Gastric bypass 2 years ago  Health Maintenance Due  Topic Date Due   Medicare Annual Wellness (AWV)  Never done   DTaP/Tdap/Td (1 - Tdap) Never done    Past Medical History:  Diagnosis Date   Arthritis    Depression    Hypertension    Muscle spasms of lower extremity    and back   Tachycardia     Past Surgical History:  Procedure Laterality Date   ABDOMINOPLASTY/PANNICULECTOMY WITH LIPOSUCTION N/A 03/19/2022   Procedure: PANNICULECTOMY WITH LIPOSUCTION;  Surgeon: Peggye Form, DO;  Location: [facility];  Service: Plastics;  Laterality: N/A;   COLONOSCOPY WITH PROPOFOL N/A 09/24/2012   Procedure: COLONOSCOPY WITH PROPOFOL;  Surgeon: Petra Kuba, MD;  Location: [facility];  Service: Endoscopy;  Laterality: N/A;   COLONOSCOPY WITH PROPOFOL N/A 05/19/2016   Procedure: COLONOSCOPY WITH PROPOFOL;  Surgeon: Vida Rigger, MD;  Location: [facility];  Service: Endoscopy;  Laterality: N/A;   LAPAROSCOPIC GASTRIC SLEEVE RESECTION N/A 04/24/2020   Procedure: LAPAROSCOPIC GASTRIC SLEEVE RESECTION;  Surgeon: Berna Bue, MD;  Location: [facility];  Service: General;  Laterality: N/A;   NO PAST  SURGERIES     RIGHT/LEFT HEART CATH AND CORONARY ANGIOGRAPHY N/A 07/04/2022   Procedure: RIGHT/LEFT HEART CATH AND CORONARY ANGIOGRAPHY;  Surgeon: Marykay Lex, MD;  Location: [facility] INVASIVE CV LAB;  Service: Cardiovascular;  Laterality: N/A;   TUBAL LIGATION     UPPER GI ENDOSCOPY N/A 04/24/2020   Procedure: UPPER GI ENDOSCOPY;  Surgeon: Berna Bue, MD;  Location: [facility];  Service: General;  Laterality: N/A;    Family History  Problem Relation Age of Onset   Hypertension Mother    Diabetes Mother    Breast cancer Mother    Diabetes Father    Hypertension Father     Social History   Socioeconomic History   Marital status: Single    Spouse name: Not on file   Number of children: Not on file   Years of education: Not on file   Highest education level: Not on file  Occupational History   Not on file  Tobacco Use   Smoking status: Never   Smokeless tobacco: Never  Vaping Use   Vaping Use: Never used  Substance and Sexual Activity   Alcohol use: No   Drug use: No   Sexual activity: Yes    Partners: Male    Birth control/protection: None  Other Topics Concern   Not on file  Social History Narrative   Not on file   Social Determinants of Health   Financial  Resource Strain: Not on file  Food Insecurity: No Food Insecurity (03/31/2022)   Hunger Vital Sign    Worried About Running Out of Food in the Last Year: Never true    Ran Out of Food in the Last Year: Never true  Transportation Needs: No Transportation Needs (03/31/2022)   PRAPARE - Administrator, Civil Service (Medical): No    Lack of Transportation (Non-Medical): No  Physical Activity: Not on file  Stress: Not on file  Social Connections: Not on file  Intimate Partner Violence: Not on file    Outpatient Medications Prior to Visit  Medication Sig Dispense Refill   acetaminophen (TYLENOL) 500 MG tablet Take 1 tablet (500 mg total) by mouth every 6 (six) hours as needed. (Patient taking  differently: Take 500 mg by mouth every 6 (six) hours as needed for mild pain, fever or headache.) 30 tablet 0   bimatoprost (LUMIGAN) 0.03 % ophthalmic solution Place 1 drop into both eyes at bedtime.     Blood Pressure Monitoring (BLOOD PRESSURE CUFF) MISC 1 each by Does not apply route daily. 1 each 0   brimonidine-timolol (COMBIGAN) 0.2-0.5 % ophthalmic solution Place 1 drop into both eyes every 12 (twelve) hours.     calcium citrate-vitamin D (CELEBRATE CALCIUM CITRATE) 500-500 MG-UNIT chewable tablet Chew 1 tablet by mouth every evening.     citalopram (CELEXA) 40 MG tablet Take 40 mg by mouth daily.     cyclobenzaprine (FLEXERIL) 10 MG tablet Take 10 mg by mouth 3 (three) times daily.     ferrous sulfate 324 MG TBEC Take 324 mg by mouth daily with breakfast.     lidocaine (LIDODERM) 5 % Place 1 patch onto the skin daily. Remove & Discard patch within 12 hours or as directed by MD 30 patch 0   Lurasidone HCl (LATUDA) 60 MG TABS Take 60 mg by mouth at bedtime.     metoprolol tartrate (LOPRESSOR) 50 MG tablet Take 1 tablet (50 mg total) by mouth 2 (two) times daily. Can take an additional tablet as needed if heart rate over 100 180 tablet 3   Multiple Vitamins-Minerals (CELEBRATE MULTI-COMPLETE 45) CHEW Chew 1 each by mouth 3 (three) times daily.     ondansetron (ZOFRAN) 4 MG tablet Take 1 tablet (4 mg total) by mouth every 8 (eight) hours as needed for nausea or vomiting. 20 tablet 0   orphenadrine (NORFLEX) 100 MG tablet Take 1 tablet (100 mg total) by mouth 2 (two) times daily. 30 tablet 0   oxyCODONE-acetaminophen (PERCOCET) 10-325 MG tablet Take 1 tablet by mouth twice a day as needed. (Patient taking differently: Take 1 tablet by mouth 3 (three) times daily as needed for pain.) 60 tablet 0   pravastatin (PRAVACHOL) 20 MG tablet Take 1 tablet (20 mg total) by mouth daily. 90 tablet 1   RESTASIS 0.05 % ophthalmic emulsion Place 1 drop into both eyes 2 (two) times daily.      sacubitril-valsartan (ENTRESTO) 49-51 MG Take 1 tablet by mouth 2 (two) times daily. 60 tablet 3   spironolactone (ALDACTONE) 25 MG tablet Take 0.5 tablets (12.5 mg total) by mouth daily. 30 tablet 3   No facility-administered medications prior to visit.    No Known Allergies  Review of Systems  Constitutional:  Negative for chills and fever.  Respiratory:  Negative for shortness of breath.   Cardiovascular:  Negative for chest pain, palpitations and leg swelling.  Gastrointestinal:  Negative for abdominal pain, nausea and  vomiting.  Neurological:  Negative for dizziness and focal weakness.       Objective:    Physical Exam Constitutional:      General: She is not in acute distress.    Appearance: She is not ill-appearing.  Eyes:     Conjunctiva/sclera: Conjunctivae normal.  Cardiovascular:     Rate and Rhythm: Normal rate.  Pulmonary:     Effort: Pulmonary effort is normal.  Musculoskeletal:     Cervical back: Normal range of motion and neck supple.  Skin:    General: Skin is warm and dry.  Neurological:     General: No focal deficit present.     Mental Status: She is alert and oriented to person, place, and time.     Motor: No weakness.     Coordination: Coordination normal.     Gait: Gait abnormal.  Psychiatric:        Mood and Affect: Mood normal.        Behavior: Behavior normal.        Thought Content: Thought content normal.     BP 126/86 (BP Location: Left Arm, Patient Position: Sitting, Cuff Size: Large)   Pulse 70   Temp 97.6 F (36.4 C) (Temporal)   Ht 5\' 5"  (1.651 m)   Wt 289 lb (131.1 kg)   LMP 09/25/2015 (Approximate)   SpO2 99%   BMI 48.09 kg/m  Wt Readings from Last 3 Encounters:  07/10/22 289 lb (131.1 kg)  07/08/22 289 lb (131.1 kg)  07/04/22 280 lb (127 kg)       Assessment & Plan:   Problem List Items Addressed This Visit       Musculoskeletal and Integument   Osteoarthritis of knee - Primary     Other   Chronic pain syndrome    Morbid obesity (HCC)   Other Visit Diagnoses     Hyperlipidemia, unspecified hyperlipidemia type          She is here for 42-month follow-up on chronic health conditions.  She is under the care of several specialists.  She has been closely followed by cardiology and had a recent heart cath.  Currently wearing a Zio patch.  Requests handicap placard due to chronic knee pain and arthritis.  This was filled out.  Reports taking her statin daily.  Upcoming appointment with nutritionist to continue working on healthy diet.  I am having Carla Sloan. Carla Nida "Debbie" maintain her citalopram, bimatoprost, Combigan, Latuda, ferrous sulfate, Celebrate Multi-Complete 45, Celebrate Calcium Citrate, acetaminophen, lidocaine, orphenadrine, oxyCODONE-acetaminophen, pravastatin, cyclobenzaprine, ondansetron, Restasis, metoprolol tartrate, Entresto, Blood Pressure Cuff, and spironolactone.  No orders of the defined types were placed in this encounter.

## 2022-07-15 ENCOUNTER — Ambulatory Visit: Payer: Medicaid Other | Admitting: Skilled Nursing Facility1

## 2022-07-16 ENCOUNTER — Ambulatory Visit: Payer: Medicare HMO | Attending: Nurse Practitioner

## 2022-07-16 DIAGNOSIS — I5022 Chronic systolic (congestive) heart failure: Secondary | ICD-10-CM | POA: Diagnosis not present

## 2022-07-16 DIAGNOSIS — F333 Major depressive disorder, recurrent, severe with psychotic symptoms: Secondary | ICD-10-CM | POA: Diagnosis not present

## 2022-07-16 DIAGNOSIS — R000 Tachycardia, unspecified: Secondary | ICD-10-CM

## 2022-07-16 DIAGNOSIS — I2510 Atherosclerotic heart disease of native coronary artery without angina pectoris: Secondary | ICD-10-CM | POA: Diagnosis not present

## 2022-07-16 DIAGNOSIS — I272 Pulmonary hypertension, unspecified: Secondary | ICD-10-CM | POA: Diagnosis not present

## 2022-07-16 DIAGNOSIS — Z86718 Personal history of other venous thrombosis and embolism: Secondary | ICD-10-CM | POA: Diagnosis not present

## 2022-07-16 DIAGNOSIS — Z8249 Family history of ischemic heart disease and other diseases of the circulatory system: Secondary | ICD-10-CM

## 2022-07-17 ENCOUNTER — Other Ambulatory Visit: Payer: Self-pay | Admitting: Family Medicine

## 2022-07-17 DIAGNOSIS — I5022 Chronic systolic (congestive) heart failure: Secondary | ICD-10-CM | POA: Diagnosis not present

## 2022-07-17 DIAGNOSIS — E785 Hyperlipidemia, unspecified: Secondary | ICD-10-CM

## 2022-07-17 DIAGNOSIS — Z8249 Family history of ischemic heart disease and other diseases of the circulatory system: Secondary | ICD-10-CM | POA: Diagnosis not present

## 2022-07-17 DIAGNOSIS — I495 Sick sinus syndrome: Secondary | ICD-10-CM | POA: Diagnosis not present

## 2022-07-17 LAB — BASIC METABOLIC PANEL
BUN/Creatinine Ratio: 26 (ref 12–28)
BUN: 19 mg/dL (ref 8–27)
CO2: 23 mmol/L (ref 20–29)
Calcium: 10.3 mg/dL (ref 8.7–10.3)
Chloride: 103 mmol/L (ref 96–106)
Creatinine, Ser: 0.72 mg/dL (ref 0.57–1.00)
Glucose: 81 mg/dL (ref 70–99)
Potassium: 4.2 mmol/L (ref 3.5–5.2)
Sodium: 142 mmol/L (ref 134–144)
eGFR: 96 mL/min/{1.73_m2} (ref >59)

## 2022-07-18 ENCOUNTER — Ambulatory Visit (INDEPENDENT_AMBULATORY_CARE_PROVIDER_SITE_OTHER): Payer: Medicare HMO | Admitting: Surgical

## 2022-07-18 DIAGNOSIS — M793 Panniculitis, unspecified: Secondary | ICD-10-CM

## 2022-07-18 DIAGNOSIS — S31109D Unspecified open wound of abdominal wall, unspecified quadrant without penetration into peritoneal cavity, subsequent encounter: Secondary | ICD-10-CM | POA: Diagnosis not present

## 2022-07-18 NOTE — Progress Notes (Signed)
Patient is a very pleasant 62 year old female here for follow-up after panniculectomy with Dr. Ulice Bold on 03/19/2022.  She is 4 months postop.  We have been managing abdominal wounds from the procedure.  We initially were using wet-to-dry dressings and subsequently switched to Vaseline and gauze as the depth of the wounds continued to improve.  At her last appointment she was doing well.  She reports today that she is doing really well.  She has been using Vaseline and gauze on the dressings and has noticed a significant improvement.  She is not having any issues at this time.  She does note that she had a cardiac procedure recently and is being monitored by cardiology in relation to this.  She reports she was notified she has pulmonary hypertension.  She reports she is exercising and doing well otherwise.  She reports that she feels comfortable continuing to manage her wound without additional follow-up.  On exam abdominal wound on the left side has completely healed, the central left abdominal wound is now a pinpoint area of approximately 4 x 4 mm.  The right medial wound is approximately 0.8 x 1.5 cm.  In the lateral right wound is approximately 0.5 x 1 cm.  There is no surrounding erythema or cellulitic changes.  100% granulation tissue noted at the wound base.  There is no active drainage or foul odor is noted.  Pictures were obtained of the patient and placed in the chart with the patient's or guardian's permission.  Recommend following up as needed, I feel comfortable with her continuing to manage the wounds as long as she does.  She has been doing a great job with wound care.  She knows to call with questions or concerns or follow-up if she notices any concerning changes.

## 2022-07-22 ENCOUNTER — Telehealth: Payer: Self-pay | Admitting: Cardiology

## 2022-07-22 ENCOUNTER — Ambulatory Visit
Admission: RE | Admit: 2022-07-22 | Discharge: 2022-07-22 | Disposition: A | Discharge status: Home / Self Care | Payer: Medicare HMO | Source: Ambulatory Visit | Attending: Family Medicine | Admitting: Family Medicine

## 2022-07-22 DIAGNOSIS — Z1231 Encounter for screening mammogram for malignant neoplasm of breast: Secondary | ICD-10-CM | POA: Diagnosis not present

## 2022-07-22 MED ORDER — METOPROLOL TARTRATE 50 MG PO TABS: 50.0000 mg | ORAL_TABLET | Freq: Two times a day (BID) | ORAL | 3 refills | Status: DC

## 2022-07-22 NOTE — Telephone Encounter (Signed)
Pt's medication was sent to pt's pharmacy as requested. Confirmation received.  °

## 2022-07-22 NOTE — Telephone Encounter (Signed)
*  STAT* If patient is at the pharmacy, call can be transferred to refill team.   1. Which medications need to be refilled? (please list name of each medication and dose if known) metoprolol tartrate (LOPRESSOR) 50 MG tablet  2. Which pharmacy/location (including street and city if local pharmacy) is medication to be sent to? [id number] - [place], [place] - [address]   3. Do they need a 30 day or 90 day supply?   90 day supply  Patient states she is completely out of medication.

## 2022-07-31 DIAGNOSIS — Z5181 Encounter for therapeutic drug level monitoring: Secondary | ICD-10-CM | POA: Diagnosis not present

## 2022-07-31 DIAGNOSIS — G894 Chronic pain syndrome: Secondary | ICD-10-CM | POA: Diagnosis not present

## 2022-07-31 DIAGNOSIS — M170 Bilateral primary osteoarthritis of knee: Secondary | ICD-10-CM | POA: Diagnosis not present

## 2022-08-01 ENCOUNTER — Telehealth: Payer: Self-pay | Admitting: Family Medicine

## 2022-08-01 NOTE — Telephone Encounter (Signed)
Pt wants to know if she can get her Handicap sticker for five years. Please advise.

## 2022-08-02 ENCOUNTER — Ambulatory Visit (HOSPITAL_COMMUNITY)
Admission: EM | Admit: 2022-08-02 | Discharge: 2022-08-02 | Disposition: A | Discharge status: Home / Self Care | Payer: Medicare HMO | Attending: Emergency Medicine | Admitting: Emergency Medicine

## 2022-08-02 ENCOUNTER — Encounter (HOSPITAL_COMMUNITY): Payer: Self-pay | Admitting: Emergency Medicine

## 2022-08-02 DIAGNOSIS — N898 Other specified noninflammatory disorders of vagina: Secondary | ICD-10-CM | POA: Diagnosis not present

## 2022-08-02 DIAGNOSIS — Z113 Encounter for screening for infections with a predominantly sexual mode of transmission: Secondary | ICD-10-CM | POA: Diagnosis not present

## 2022-08-02 LAB — POCT URINALYSIS DIP (MANUAL ENTRY)
Bilirubin, UA: NEGATIVE
Blood, UA: NEGATIVE
Glucose, UA: NEGATIVE mg/dL
Ketones, POC UA: NEGATIVE mg/dL
Nitrite, UA: NEGATIVE
Protein Ur, POC: NEGATIVE mg/dL
Spec Grav, UA: <=1.005 — AB (ref 1.010–1.025)
Urobilinogen, UA: 0.2 E.U./dL
pH, UA: 6 (ref 5.0–8.0)

## 2022-08-02 MED ORDER — METRONIDAZOLE 500 MG PO TABS: 500.0000 mg | ORAL_TABLET | Freq: Two times a day (BID) | ORAL | 0 refills | Status: DC

## 2022-08-02 NOTE — ED Triage Notes (Signed)
Pt had vaginal discharge for a couple days. Reports thinks either yeast or BV.

## 2022-08-02 NOTE — Discharge Instructions (Addendum)
[email or web address]   Today you are being treated prophylactically for  Bacterial vaginosis   Urinalysis is negative for any signs of infection  Take Metronidazole 500 mg twice a day for 7 days, do not drink alcohol while using medication, this will make you feel sick   Bacterial vaginosis which results from an overgrowth of one on several organisms that are normally present in your vagina. Vaginosis is an inflammation of the vagina that can result in discharge, itching and pain.  Labs pending 2-3 days, you will be contacted if positive for any sti and treatment will be sent to the pharmacy, you will have to return to the clinic if positive for gonorrhea to receive treatment   Please refrain from having sex until labs results, if positive please refrain from having sex until treatment complete and symptoms resolve   If positive for Chlamydia  gonorrhea or trichomoniasis please notify partner or partners so they may tested as well  Moving forward, it is recommended you use some form of protection against the transmission of sti infections  such as condoms or dental dams with each sexual encounter     In addition: Avoid baths, hot tubs and whirlpool spas.  Don't use scented or harsh soaps Avoid irritants. These include scented tampons and pads. Wipe from front to back after using the toilet. Don't douche. Your vagina doesn't require cleansing other than normal bathing.  Use a condom.  Wear cotton underwear, this fabric absorbs some moisture.

## 2022-08-02 NOTE — ED Provider Notes (Signed)
[facility]    CSN: [id number] Arrival date & time: 08/02/22  1308      History   Chief Complaint Chief Complaint  Patient presents with   Vaginal Discharge    HPI Carla Sloan is a 62 y.o. female.   Patient lives evaluation of Carla Sloan discharge with mild odor present for 2 days.  Sexual encounter with a partner 7 days ago, no known exposure to.  Denies urinary symptoms, lower abdominal pain, flank pain, fevers, vaginal itching.  Not attempted treatment.    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   Pain in right foot 05/11/2020   Morbid obesity (HCC) 04/24/2020   Osteoarthritis of knee 12/10/2017   Osteoarthritis of left knee 12/10/2017   Pain in right knee 09/09/2017   Pain in left knee 09/09/2017    Past Surgical History:  Procedure Laterality Date   ABDOMINOPLASTY/PANNICULECTOMY WITH LIPOSUCTION N/A 03/19/2022   Procedure: PANNICULECTOMY WITH LIPOSUCTION;  Surgeon: Peggye Form, DO;  Location: [facility];  Service: Plastics;  Laterality: N/A;   COLONOSCOPY WITH PROPOFOL N/A 09/24/2012   Procedure: COLONOSCOPY WITH PROPOFOL;  Surgeon: Petra Kuba, MD;  Location: [facility];  Service: Endoscopy;  Laterality: N/A;   COLONOSCOPY WITH PROPOFOL N/A 05/19/2016   Procedure: COLONOSCOPY WITH PROPOFOL;  Surgeon: Vida Rigger, MD;  Location: [facility] ENDOSCOPY;  Service: Endoscopy;  Laterality: N/A;   LAPAROSCOPIC GASTRIC SLEEVE RESECTION N/A 04/24/2020   Procedure: LAPAROSCOPIC GASTRIC SLEEVE RESECTION;  Surgeon: Berna Bue, MD;  Location: [facility];  Service: General;  Laterality: N/A;   NO PAST SURGERIES     RIGHT/LEFT HEART CATH AND CORONARY ANGIOGRAPHY N/A 07/04/2022   Procedure: RIGHT/LEFT HEART CATH AND CORONARY ANGIOGRAPHY;  Surgeon: Marykay Lex, MD;  Location[facility]VAS[facility] CV LAB;  Service: Cardiovascular;  Laterality: N/A;   TUBAL LIGATION     UPPER GI ENDOSCOPY N/A 04/24/2020   Procedure: UPPER GI ENDOSCOPY;  Surgeon: Berna Bue, MD;  Location: [facility];  Service: General;  Laterality: N/A;    OB History     Gravida  2   Para  2   Term  2   Preterm      AB      Living  2      SAB      IAB      Ectopic      Multiple      Live Births  2            Home Medications    Prior to Admission medications   Medication Sig Start Date End Date Taking? Authorizing Provider  acetaminophen (TYLENOL) 500 MG tablet Take 1 tablet (500 mg total) by mouth every 6 (six) hours as needed. Patient taking differently: Take 500 mg by mouth every 6 (six) hours as needed for mild pain, fever or headache. 05/11/20   Rushie Chestnut, PA-C  bimatoprost (LUMIGAN) 0.03 % ophthalmic solution Place 1 drop into both eyes at bedtime.  [provider]  Blood Pressure Monitoring (BLOOD PRESSURE CUFF) MISC 1 each by Does not apply route daily. 07/08/22   Gaston Islam., NP  brimonidine-timolol (COMBIGAN) 0.2-0.5 % ophthalmic solution Place 1 drop into both eyes every 12 (twelve) hours.    [provider]  calcium citrate-vitamin D (CELEBRATE CALCIUM CITRATE) 500-500 MG-UNIT chewable tablet Chew 1 tablet by mouth every evening.    [provider]  citalopram (CELEXA) 40 MG tablet Take 40 mg by mouth daily.    [provider]  cyclobenzaprine (FLEXERIL) 10 MG tablet Take 10 mg by mouth 3 (three) times daily. 11/12/21   [provider]  ferrous sulfate 324 MG TBEC Take 324 mg by mouth daily with breakfast.    [provider]  lidocaine  (LIDODERM) 5 % Place 1 patch onto the skin daily. Remove & Discard patch within 12 hours or as directed by MD 07/17/21   Raspet, Noberto Retort, PA-C  Lurasidone HCl (LATUDA) 60 MG TABS Take 60 mg by mouth at bedtime.    [provider]  metoprolol tartrate (LOPRESSOR) 50 MG tablet Take 1 tablet (50 mg total) by mouth 2 (two) times daily. Can take an additional tablet as needed if heart rate over 100 07/22/22   Gaston Islam., NP  Multiple Vitamins-Minerals (CELEBRATE MULTI-COMPLETE 45) CHEW Chew 1 each by mouth 3 (three) times daily.    [provider]  ondansetron (ZOFRAN) 4 MG tablet Take 1 tablet (4 mg total) by mouth every 8 (eight) hours as needed for nausea or vomiting. 03/07/22   Scheeler, Kermit Balo, PA-C  orphenadrine (NORFLEX) 100 MG tablet Take 1 tablet (100 mg total) by mouth 2 (two) times daily. 07/20/21   Dione Booze, MD  oxyCODONE-acetaminophen (PERCOCET) 10-325 MG tablet Take 1 tablet by mouth twice a day as needed. Patient taking differently: Take 1 tablet by mouth 3 (three) times daily as needed for pain. 08/05/21     pravastatin (PRAVACHOL) 20 MG tablet Take 1 tablet (20 mg total) by mouth daily. Follow-up appt due in August w/lipid must see provider for future refills 07/17/22   Henson, Vickie L, NP-C  RESTASIS 0.05 % ophthalmic emulsion Place 1 drop into both eyes 2 (two) times daily. 06/08/22   [provider]  sacubitril-valsartan (ENTRESTO) 49-51 MG Take 1 tablet by mouth 2 (two) times daily. 07/08/22   Gaston Islam., NP  spironolactone (ALDACTONE) 25 MG tablet Take 0.5 tablets (12.5 mg total) by mouth daily. 07/08/22 10/06/22  Gaston Islam., NP    Family History Family History  Problem Relation Age of Onset   Hypertension Mother    Diabetes Mother    Breast cancer Mother    Diabetes Father    Hypertension Father     Social History Social History   Tobacco Use   Smoking status: Never   Smokeless tobacco: Never  Vaping Use   Vaping Use:  Never used  Substance Use Topics   Alcohol use: No   Drug use: No     Allergies   Patient has no known allergies.   Review of Systems Review of Systems  Genitourinary:  Positive for vaginal discharge.     Physical Exam Triage Vital Signs ED Triage Vitals  Enc Vitals Group     BP 08/02/22 1359 128/80     Pulse Rate 08/02/22 1359 72     Resp 08/02/22 1359 18     Temp 08/02/22 1359 (!) 97.5 F (  36.4 C)     Temp Source 08/02/22 1359 Oral     SpO2 08/02/22 1359 98 %     Weight --      Height --      Head Circumference --      Peak Flow --      Pain Score 08/02/22 1358 0     Pain Loc --      Pain Edu? --      Excl. in GC? --    No data found.  Updated Vital Signs BP 128/80 (BP Location: Left Arm)   Pulse 72   Temp (!) 97.5 F (36.4 C) (Oral)   Resp 18   LMP 09/25/2015 (Approximate)   SpO2 98%   Visual Acuity Right Eye Distance:   Left Eye Distance:   Bilateral Distance:    Right Eye Near:   Left Eye Near:    Bilateral Near:     Physical Exam Constitutional:      Appearance: Normal appearance.  Eyes:     Extraocular Movements: Extraocular movements intact.  Pulmonary:     Effort: Pulmonary effort is normal.  Genitourinary:    Comments: deferred Neurological:     Mental Status: She is alert and oriented to person, place, and time. Mental status is at baseline.      UC Treatments / Results  Labs (all labs ordered are listed, but only abnormal results are displayed) Labs Reviewed  POCT URINALYSIS DIP (MANUAL ENTRY)  CERVICOVAGINAL ANCILLARY ONLY    EKG   Radiology No results found.  Procedures Procedures (including critical care time)  Medications Ordered in UC Medications - No data to display  Initial Impression / Assessment and Plan / UC Course  I have reviewed the triage vital signs and the nursing notes.  Pertinent labs & imaging results that were available during my care of the patient were reviewed by me and considered in my  medical decision making (see chart for details).  Discharge  Urinalysis negative, treating prophylactically for BV, metronidazole sent to pharmacy, advise abstaining from alcohol during use, STI labs pending will treat per protocol, advised abstinence until lab results, and/or treatment is complete, advised condom use during all sexual encounters moving, may follow-up with urgent care as needed  Final Clinical Impressions(s) / UC Diagnoses   Final diagnoses:  None   Discharge Instructions   None    ED Prescriptions   None    PDMP not reviewed this encounter.   Valinda Hoar, NP 08/02/22 1428

## 2022-08-04 LAB — CERVICOVAGINAL ANCILLARY ONLY
Bacterial Vaginitis (gardnerella): POSITIVE — AB
Candida Glabrata: NEGATIVE
Candida Vaginitis: NEGATIVE
Chlamydia: NEGATIVE
Comment: NEGATIVE
Comment: NEGATIVE
Comment: NEGATIVE
Comment: NEGATIVE
Comment: NEGATIVE
Comment: NORMAL
Neisseria Gonorrhea: NEGATIVE
Trichomonas: POSITIVE — AB

## 2022-08-05 ENCOUNTER — Telehealth: Payer: Self-pay

## 2022-08-05 ENCOUNTER — Ambulatory Visit (INDEPENDENT_AMBULATORY_CARE_PROVIDER_SITE_OTHER): Payer: Medicare HMO

## 2022-08-05 VITALS — Ht 65.0 in | Wt 289.0 lb

## 2022-08-05 DIAGNOSIS — Z0000 Encounter for general adult medical examination without abnormal findings: Secondary | ICD-10-CM | POA: Diagnosis not present

## 2022-08-05 NOTE — Patient Instructions (Addendum)
Carla Sloan , Thank you for taking time to come for your Medicare Wellness Visit. I appreciate your ongoing commitment to your health goals. Please review the following plan we discussed and let me know if I can assist you in the future.   These are the goals we discussed:  Goals      My goal for 2024 is to start back walking and doing water aerobics.        This is a list of the screening recommended for you and due dates:  Health Maintenance  Topic Date Due   DTaP/Tdap/Td vaccine (1 - Tdap) Never done   COVID-19 Vaccine (6 - 2023-24 season) 01/24/2022   Flu Shot  09/25/2022   Medicare Annual Wellness Visit  08/05/2023   Pap Smear  09/21/2023   Mammogram  07/21/2024   Colon Cancer Screening  05/20/2026   Hepatitis C Screening  Completed   HIV Screening  Completed   HPV Vaccine  Aged Out   Zoster (Shingles) Vaccine  Discontinued    Advanced directives: No  Conditions/risks identified: Yes  Next appointment: Follow up in one year for your annual wellness visit via telephone call with Nurse Percell Miller on 08/10/2023 at 4:00 pm.  If you need to cancel or reschedule please call [phone].  Preventive Care 40-64 Years, Female Preventive care refers to lifestyle choices and visits with your health care provider that can promote health and wellness. What does preventive care include? A yearly physical exam. This is also called an annual well check. Dental exams once or twice a year. Routine eye exams. Ask your health care provider how often you should have your eyes checked. Personal lifestyle choices, including: Daily care of your teeth and gums. Regular physical activity. Eating a healthy diet. Avoiding tobacco and drug use. Limiting alcohol use. Practicing safe sex. Taking low-dose aspirin daily starting at age 37. Taking vitamin and mineral supplements as recommended by your health care provider. What happens during an annual well check? The services and screenings done  by your health care provider during your annual well check will depend on your age, overall health, lifestyle risk factors, and family history of disease. Counseling  Your health care provider may ask you questions about your: Alcohol use. Tobacco use. Drug use. Emotional well-being. Home and relationship well-being. Sexual activity. Eating habits. Work and work Astronomer. Method of birth control. Menstrual cycle. Pregnancy history. Screening  You may have the following tests or measurements: Height, weight, and BMI. Blood pressure. Lipid and cholesterol levels. These may be checked every 5 years, or more frequently if you are over 94 years old. Skin check. Lung cancer screening. You may have this screening every year starting at age 74 if you have a 30-pack-year history of smoking and currently smoke or have quit within the past 15 years. Fecal occult blood test (FOBT) of the stool. You may have this test every year starting at age 53. Flexible sigmoidoscopy or colonoscopy. You may have a sigmoidoscopy every 5 years or a colonoscopy every 10 years starting at age 5. Hepatitis C blood test. Hepatitis B blood test. Sexually transmitted disease (STD) testing. Diabetes screening. This is done by checking your blood sugar (glucose) after you have not eaten for a while (fasting). You may have this done every 1-3 years. Mammogram. This may be done every 1-2 years. Talk to your health care provider about when you should start having regular mammograms. This may depend on whether you have a family history of  breast cancer. BRCA-related cancer screening. This may be done if you have a family history of breast, ovarian, tubal, or peritoneal cancers. Pelvic exam and Pap test. This may be done every 3 years starting at age 74. Starting at age 2, this may be done every 5 years if you have a Pap test in combination with an HPV test. Bone density scan. This is done to screen for osteoporosis. You  may have this scan if you are at high risk for osteoporosis. Discuss your test results, treatment options, and if necessary, the need for more tests with your health care provider. Vaccines  Your health care provider may recommend certain vaccines, such as: Influenza vaccine. This is recommended every year. Tetanus, diphtheria, and acellular pertussis (Tdap, Td) vaccine. You may need a Td booster every 10 years. Zoster vaccine. You may need this after age 18. Pneumococcal 13-valent conjugate (PCV13) vaccine. You may need this if you have certain conditions and were not previously vaccinated. Pneumococcal polysaccharide (PPSV23) vaccine. You may need one or two doses if you smoke cigarettes or if you have certain conditions. Talk to your health care provider about which screenings and vaccines you need and how often you need them. This information is not intended to replace advice given to you by your health care provider. Make sure you discuss any questions you have with your health care provider. Document Released: 03/09/2015 Document Revised: 10/31/2015 Document Reviewed: 12/12/2014 Elsevier Interactive Patient Education  2017 [employer].    Fall Prevention in the Home Falls can cause injuries. They can happen to people of all ages. There are many things you can do to make your home safe and to help prevent falls. What can I do on the outside of my home? Regularly fix the edges of walkways and driveways and fix any cracks. Remove anything that might make you trip as you walk through a door, such as a raised step or threshold. Trim any bushes or trees on the path to your home. Use bright outdoor lighting. Clear any walking paths of anything that might make someone trip, such as rocks or tools. Regularly check to see if handrails are loose or broken. Make sure that both sides of any steps have handrails. Any raised decks and porches should have guardrails on the edges. Have any leaves,  snow, or ice cleared regularly. Use sand or salt on walking paths during winter. Clean up any spills in your garage right away. This includes oil or grease spills. What can I do in the bathroom? Use night lights. Install grab bars by the toilet and in the tub and shower. Do not use towel bars as grab bars. Use non-skid mats or decals in the tub or shower. If you need to sit down in the shower, use a plastic, non-slip stool. Keep the floor dry. Clean up any water that spills on the floor as soon as it happens. Remove soap buildup in the tub or shower regularly. Attach bath mats securely with double-sided non-slip rug tape. Do not have throw rugs and other things on the floor that can make you trip. What can I do in the bedroom? Use night lights. Make sure that you have a light by your bed that is easy to reach. Do not use any sheets or blankets that are too big for your bed. They should not hang down onto the floor. Have a firm chair that has side arms. You can use this for support while you get dressed. Do  not have throw rugs and other things on the floor that can make you trip. What can I do in the kitchen? Clean up any spills right away. Avoid walking on wet floors. Keep items that you use a lot in easy-to-reach places. If you need to reach something above you, use a strong step stool that has a grab bar. Keep electrical cords out of the way. Do not use floor polish or wax that makes floors slippery. If you must use wax, use non-skid floor wax. Do not have throw rugs and other things on the floor that can make you trip. What can I do with my stairs? Do not leave any items on the stairs. Make sure that there are handrails on both sides of the stairs and use them. Fix handrails that are broken or loose. Make sure that handrails are as long as the stairways. Check any carpeting to make sure that it is firmly attached to the stairs. Fix any carpet that is loose or worn. Avoid having throw  rugs at the top or bottom of the stairs. If you do have throw rugs, attach them to the floor with carpet tape. Make sure that you have a light switch at the top of the stairs and the bottom of the stairs. If you do not have them, ask someone to add them for you. What else can I do to help prevent falls? Wear shoes that: Do not have high heels. Have rubber bottoms. Are comfortable and fit you well. Are closed at the toe. Do not wear sandals. If you use a stepladder: Make sure that it is fully opened. Do not climb a closed stepladder. Make sure that both sides of the stepladder are locked into place. Ask someone to hold it for you, if possible. Clearly mark and make sure that you can see: Any grab bars or handrails. First and last steps. Where the edge of each step is. Use tools that help you move around (mobility aids) if they are needed. These include: Canes. Walkers. Scooters. Crutches. Turn on the lights when you go into a dark area. Replace any light bulbs as soon as they burn out. Set up your furniture so you have a clear path. Avoid moving your furniture around. If any of your floors are uneven, fix them. If there are any pets around you, be aware of where they are. Review your medicines with your doctor. Some medicines can make you feel dizzy. This can increase your chance of falling. Ask your doctor what other things that you can do to help prevent falls. This information is not intended to replace advice given to you by your health care provider. Make sure you discuss any questions you have with your health care provider. Document Released: 12/07/2008 Document Revised: 07/19/2015 Document Reviewed: 03/17/2014 Elsevier Interactive Patient Education  2017 [employer].

## 2022-08-05 NOTE — Telephone Encounter (Signed)
Message sent via MyChart.

## 2022-08-05 NOTE — Telephone Encounter (Signed)
Unsuccessful attempt to reach patient on preferred number listed in notes for scheduled AWV, Left message on voicemail okay to reschedule. 

## 2022-08-05 NOTE — Telephone Encounter (Signed)
Patient returned call and AWV-I was completed via phone.

## 2022-08-05 NOTE — Progress Notes (Signed)
I connected with  Carla Sloan on 08/05/22 by a audio enabled telemedicine application and verified that I am speaking with the correct person using two identifiers.  Patient Location: Home  Provider Location: Office/Clinic  I discussed the limitations of evaluation and management by telemedicine. The patient expressed understanding and agreed to proceed.  Subjective:   Carla Sloan is a 62 y.o. female who presents for an Initial Medicare Annual Wellness Visit.  Review of Systems     Cardiac Risk Factors include: advanced age (>95men, >53 women);dyslipidemia;family history of premature cardiovascular disease;hypertension;obesity (BMI >30kg/m2);sedentary lifestyle     Objective:    Today's Vitals   08/05/22 1507  Weight: 289 lb (131.1 kg)  Height: 5\' 5"  (1.651 m)  PainSc: 6   PainLoc: Knee   Body mass index is 48.09 kg/m.     08/05/2022    3:10 PM 07/04/2022    7:39 AM 04/11/2022    8:55 AM 03/19/2022    6:23 AM 03/12/2022    4:05 PM 07/20/2021    3:13 AM 05/18/2021    6:41 AM  Advanced Directives  Does Patient Have a Medical Advance Directive? No No No No No No No  Would patient like information on creating a medical advance directive? No - Patient declined No - Patient declined No - Patient declined No - Patient declined No - Patient declined      Current Medications (verified) Outpatient Encounter Medications as of 08/05/2022  Medication Sig   acetaminophen (TYLENOL) 500 MG tablet Take 1 tablet (500 mg total) by mouth every 6 (six) hours as needed. (Patient taking differently: Take 500 mg by mouth every 6 (six) hours as needed for mild pain, fever or headache.)   bimatoprost (LUMIGAN) 0.03 % ophthalmic solution Place 1 drop into both eyes at bedtime.   Blood Pressure Monitoring (BLOOD PRESSURE CUFF) MISC 1 each by Does not apply route daily.   brimonidine-timolol (COMBIGAN) 0.2-0.5 % ophthalmic solution Place 1 drop into both eyes every 12 (twelve)  hours.   calcium citrate-vitamin D (CELEBRATE CALCIUM CITRATE) 500-500 MG-UNIT chewable tablet Chew 1 tablet by mouth every evening.   citalopram (CELEXA) 40 MG tablet Take 40 mg by mouth daily.   cyclobenzaprine (FLEXERIL) 10 MG tablet Take 10 mg by mouth 3 (three) times daily.   ferrous sulfate 324 MG TBEC Take 324 mg by mouth daily with breakfast.   lidocaine (LIDODERM) 5 % Place 1 patch onto the skin daily. Remove & Discard patch within 12 hours or as directed by MD   Lurasidone HCl (LATUDA) 60 MG TABS Take 60 mg by mouth at bedtime.   metoprolol tartrate (LOPRESSOR) 50 MG tablet Take 1 tablet (50 mg total) by mouth 2 (two) times daily. Can take an additional tablet as needed if heart rate over 100   metroNIDAZOLE (FLAGYL) 500 MG tablet Take 1 tablet (500 mg total) by mouth 2 (two) times daily.   Multiple Vitamins-Minerals (CELEBRATE MULTI-COMPLETE 45) CHEW Chew 1 each by mouth 3 (three) times daily.   ondansetron (ZOFRAN) 4 MG tablet Take 1 tablet (4 mg total) by mouth every 8 (eight) hours as needed for nausea or vomiting.   orphenadrine (NORFLEX) 100 MG tablet Take 1 tablet (100 mg total) by mouth 2 (two) times daily.   oxyCODONE-acetaminophen (PERCOCET) 10-325 MG tablet Take 1 tablet by mouth twice a day as needed. (Patient taking differently: Take 1 tablet by mouth 3 (three) times daily as needed for pain.)   pravastatin (PRAVACHOL) 20  MG tablet Take 1 tablet (20 mg total) by mouth daily. Follow-up appt due in August w/lipid must see provider for future refills   RESTASIS 0.05 % ophthalmic emulsion Place 1 drop into both eyes 2 (two) times daily.   sacubitril-valsartan (ENTRESTO) 49-51 MG Take 1 tablet by mouth 2 (two) times daily.   spironolactone (ALDACTONE) 25 MG tablet Take 0.5 tablets (12.5 mg total) by mouth daily.   No facility-administered encounter medications on file as of 08/05/2022.    Allergies (verified) Patient has no known allergies.   History: Past Medical History:   Diagnosis Date   Arthritis    Depression    Hypertension    Muscle spasms of lower extremity    and back   Tachycardia    Past Surgical History:  Procedure Laterality Date   ABDOMINOPLASTY/PANNICULECTOMY WITH LIPOSUCTION N/A 03/19/2022   Procedure: PANNICULECTOMY WITH LIPOSUCTION;  Surgeon: Peggye Form, DO;  Location: [facility];  Service: Plastics;  Laterality: N/A;   COLONOSCOPY WITH PROPOFOL N/A 09/24/2012   Procedure: COLONOSCOPY WITH PROPOFOL;  Surgeon: Petra Kuba, MD;  Location: [facility];  Service: Endoscopy;  Laterality: N/A;   COLONOSCOPY WITH PROPOFOL N/A 05/19/2016   Procedure: COLONOSCOPY WITH PROPOFOL;  Surgeon: Vida Rigger, MD;  Location: [facility];  Service: Endoscopy;  Laterality: N/A;   LAPAROSCOPIC GASTRIC SLEEVE RESECTION N/A 04/24/2020   Procedure: LAPAROSCOPIC GASTRIC SLEEVE RESECTION;  Surgeon: Berna Bue, MD;  Location: [facility];  Service: General;  Laterality: N/A;   NO PAST SURGERIES     RIGHT/LEFT HEART CATH AND CORONARY ANGIOGRAPHY N/A 07/04/2022   Procedure: RIGHT/LEFT HEART CATH AND CORONARY ANGIOGRAPHY;  Surgeon: Marykay Lex, MD;  Location: [facility] INVASIVE CV LAB;  Service: Cardiovascular;  Laterality: N/A;   TUBAL LIGATION     UPPER GI ENDOSCOPY N/A 04/24/2020   Procedure: UPPER GI ENDOSCOPY;  Surgeon: Berna Bue, MD;  Location: [facility];  Service: General;  Laterality: N/A;   Family History  Problem Relation Age of Onset   Hypertension Mother    Diabetes Mother    Breast cancer Mother    Diabetes Father    Hypertension Father    Social History   Socioeconomic History   Marital status: Single    Spouse name: Not on file   Number of children: Not on file   Years of education: Not on file   Highest education level: Not on file  Occupational History   Not on file  Tobacco Use   Smoking status: Never   Smokeless tobacco: Never  Vaping Use   Vaping Use: Never used  Substance and Sexual Activity   Alcohol  use: No   Drug use: No   Sexual activity: Yes    Partners: Male    Birth control/protection: None  Other Topics Concern   Not on file  Social History Narrative   Not on file   Social Determinants of Health   Financial Resource Strain: Low Risk  (08/05/2022)   Overall Financial Resource Strain (CARDIA)    Difficulty of Paying Living Expenses: Not hard at all  Food Insecurity: No Food Insecurity (08/05/2022)   Hunger Vital Sign    Worried About Running Out of Food in the Last Year: Never true    Ran Out of Food in the Last Year: Never true  Transportation Needs: No Transportation Needs (08/05/2022)   PRAPARE - Administrator, Civil Service (Medical): No    Lack of Transportation (Non-Medical): No  Physical Activity: Inactive (08/05/2022)   Exercise Vital Sign    Days of Exercise per Week: 0 days    Minutes of Exercise per Session: 0 min  Stress: No Stress Concern Present (08/05/2022)   [employer] of Occupational Health - Occupational Stress Questionnaire    Feeling of Stress : Not at all  Social Connections: Moderately Integrated (08/05/2022)   Social Connection and Isolation Panel [NHANES]    Frequency of Communication with Friends and Family: More than three times a week    Frequency of Social Gatherings with Friends and Family: More than three times a week    Attends Religious Services: More than 4 times per year    Active Member of [employer] or Organizations: Yes    Attends Engineer, structural: More than 4 times per year    Marital Status: Never married    Tobacco Counseling Counseling given: Not Answered   Clinical Intake:  Pre-visit preparation completed: Yes  Pain : 0-10 Pain Score: 6  Pain Location: Knee Pain Orientation: Left, Right     BMI - recorded: 48.09 Nutritional Status: BMI > 30  Obese Nutritional Risks: None Diabetes: No  How often do you need to have someone help you when you read instructions, pamphlets, or other written  materials from your doctor or pharmacy?: 1 - Never What is the last grade level you completed in school?: HSG  Diabetic? No  Interpreter Needed?: No  Information entered by :: Ayleen Mckinstry N. Eriverto Byrnes, LPN.   Activities of Daily Living    08/05/2022    3:13 PM 03/19/2022    6:26 AM  In your present state of health, do you have any difficulty performing the following activities:  Hearing? 0 0  Vision? 0 0  Difficulty concentrating or making decisions? 0 0  Walking or climbing stairs? 1 0  Dressing or bathing? 1 0  Doing errands, shopping? 0   Preparing Food and eating ? N   Using the Toilet? Y   In the past six months, have you accidently leaked urine? N   Do you have problems with loss of bowel control? N   Managing your Medications? Y   Managing your Finances? N   Housekeeping or managing your Housekeeping? Y     Patient Care Team: Avanell Shackleton, NP-C as PCP - General (Family Medicine) Sallye Lat, MD as Consulting Physician (Ophthalmology)  Indicate any recent Medical Services you may have received from other than [facility] providers in the past year (date may be approximate).     Assessment:   This is a routine wellness examination for Carla Sloan.  Hearing/Vision screen Hearing Screening - Comments:: Denies hearing difficulties   Vision Screening - Comments:: Wears reading glasses - up to date with routine eye exams with Sallye Lat, MD.   Dietary issues and exercise activities discussed: Current Exercise Habits: The patient does not participate in regular exercise at present, Exercise limited by: orthopedic condition(s)   Goals Addressed             This Visit's Progress    My goal for 2024 is to start back walking and doing water aerobics.        Depression Screen    08/05/2022    3:13 PM 07/10/2022    3:03 PM 01/09/2022    3:52 PM 10/10/2021    8:13 AM 06/19/2020    7:51 AM 11/23/2018   11:46 AM 03/01/2018    9:36 AM  PHQ 2/9 Scores  PHQ -  2  Score 0 0 0 0 0 0 0  PHQ- 9 Score 0 0  0       Fall Risk    08/05/2022    3:11 PM 01/09/2022    3:52 PM 11/23/2018   11:46 AM 03/01/2018    9:36 AM  Fall Risk   Falls in the past year? 0 0 0 0  Number falls in past yr: 0 0    Injury with Fall? 0 0    Risk for fall due to : No Fall Risks No Fall Risks    Follow up Falls prevention discussed Falls evaluation completed      FALL RISK PREVENTION PERTAINING TO THE HOME:  Any stairs in or around the home? Yes  If so, are there any without handrails? No  Home free of loose throw rugs in walkways, pet beds, electrical cords, etc? Yes  Adequate lighting in your home to reduce risk of falls? Yes   ASSISTIVE DEVICES UTILIZED TO PREVENT FALLS:  Life alert? No  Use of a cane, walker or w/c? Yes  Grab bars in the bathroom? No  Shower chair or bench in shower? No  Elevated toilet seat or a handicapped toilet? No   TIMED UP AND GO:  Was the test performed? No . Telephonic Visit  Cognitive Function:        08/05/2022    3:16 PM  6CIT Screen  What Year? 0 points  What month? 0 points  What time? 0 points  Count back from 20 0 points  Months in reverse 0 points  Repeat phrase 0 points  Total Score 0 points    Immunizations Immunization History  Administered Date(s) Administered   Influenza,inj,Quad PF,6+ Mos 11/07/2018, 12/07/2018   Influenza,inj,quad, With Preservative 11/07/2018   Influenza-Unspecified 10/21/2021   PFIZER(Purple Top)SARS-COV-2 Vaccination 05/26/2019, 06/20/2019, 02/07/2020, 08/23/2020   Unspecified SARS-COV-2 Vaccination 11/29/2021    TDAP status: Due, Education has been provided regarding the importance of this vaccine. Advised may receive this vaccine at local pharmacy or Health Dept. Aware to provide a copy of the vaccination record if obtained from local pharmacy or Health Dept. Verbalized acceptance and understanding.  Flu Vaccine status: Up to date  Pneumococcal vaccine status: Due, Education has  been provided regarding the importance of this vaccine. Advised may receive this vaccine at local pharmacy or Health Dept. Aware to provide a copy of the vaccination record if obtained from local pharmacy or Health Dept. Verbalized acceptance and understanding.  Covid-19 vaccine status: Completed vaccines  Qualifies for Shingles Vaccine? Yes   Zostavax completed No   Shingrix Completed?: No.    Education has been provided regarding the importance of this vaccine. Patient has been advised to call insurance company to determine out of pocket expense if they have not yet received this vaccine. Advised may also receive vaccine at local pharmacy or Health Dept. Verbalized acceptance and understanding.  Screening Tests Health Maintenance  Topic Date Due   DTaP/Tdap/Td (1 - Tdap) Never done   COVID-19 Vaccine (6 - 2023-24 season) 01/24/2022   INFLUENZA VACCINE  09/25/2022   Medicare Annual Wellness (AWV)  08/05/2023   PAP SMEAR-Modifier  09/21/2023   MAMMOGRAM  07/21/2024   Colonoscopy  05/20/2026   Hepatitis C Screening  Completed   HIV Screening  Completed   HPV VACCINES  Aged Out   Zoster Vaccines- Shingrix  Discontinued    Health Maintenance  Health Maintenance Due  Topic Date Due   DTaP/Tdap/Td (1 -  Tdap) Never done   COVID-19 Vaccine (6 - 2023-24 season) 01/24/2022    Colorectal cancer screening: Type of screening: Colonoscopy. Completed 07/24/2021. Repeat every 5 years  Mammogram status: Completed 07/22/2022. Repeat every year  Bone Density status: Completed 06/16/2020. Results reflect: Bone density results: NORMAL. Repeat every 5 years.  Lung Cancer Screening: (Low Dose CT Chest recommended if Age 41-80 years, 30 pack-year currently smoking OR have quit w/in 15years.) does not qualify.   Lung Cancer Screening Referral: NO  Additional Screening:  Hepatitis C Screening: does qualify; Completed 10/10/2021  Vision Screening: Recommended annual ophthalmology exams for early  detection of glaucoma and other disorders of the eye. Is the patient up to date with their annual eye exam?  Yes  Who is the provider or what is the name of the office in which the patient attends annual eye exams? Sallye Lat, MD. If pt is not established with a provider, would they like to be referred to a provider to establish care? No .   Dental Screening: Recommended annual dental exams for proper oral hygiene  Community Resource Referral / Chronic Care Management: CRR required this visit?  No   CCM required this visit?  No      Plan:     I have personally reviewed and noted the following in the patient's chart:   Medical and social history Use of alcohol, tobacco or illicit drugs  Current medications and supplements including opioid prescriptions. Patient is currently taking opioid prescriptions. Information provided to patient regarding non-opioid alternatives. Patient advised to discuss non-opioid treatment plan with their provider. Functional ability and status Nutritional status Physical activity Advanced directives List of other physicians Hospitalizations, surgeries, and ER visits in previous 12 months Vitals Screenings to include cognitive, depression, and falls Referrals and appointments  In addition, I have reviewed and discussed with patient certain preventive protocols, quality metrics, and best practice recommendations. A written personalized care plan for preventive services as well as general preventive health recommendations were provided to patient.     Mickeal Needy, LPN   1/61/0960   Nurse Notes: Normal cognitive status assessed by direct observation via telephone conversation by this Nurse Health Advisor. No abnormalities found.

## 2022-08-11 ENCOUNTER — Ambulatory Visit: Payer: Medicaid Other | Admitting: Skilled Nursing Facility1

## 2022-08-19 ENCOUNTER — Encounter: Payer: Medicare HMO | Attending: Surgery | Admitting: Skilled Nursing Facility1

## 2022-08-19 ENCOUNTER — Encounter: Payer: Self-pay | Admitting: Skilled Nursing Facility1

## 2022-08-19 DIAGNOSIS — Z6841 Body Mass Index (BMI) 40.0 and over, adult: Secondary | ICD-10-CM | POA: Insufficient documentation

## 2022-08-19 DIAGNOSIS — Z713 Dietary counseling and surveillance: Secondary | ICD-10-CM | POA: Diagnosis not present

## 2022-08-19 NOTE — Progress Notes (Signed)
Follow-up visit:  Post-Operative sleeve Surgery   Anthropometrics  Surgery date: 04/24/2020 Surgery type: sleeve Start weight at [facility]: 411.3 pounds Weight today: 291.7 pounds  Labs: hemoglobin 10.7   Body Composition Scale 12/03/2020 03/05/2021 05/06/2021 12/02/2021 04/16/2022 08/19/2022  Current Body Weight 271.3 257.7 255.1 258.5 267.1 291.7  Total Body Fat % 47.5 46.4 46.1 48 47.8 49.6  Visceral Fat 19 18 18 19 20 23   Fat-Free Mass % 52.4 53.5 53.8 51.9 52.1 50.3   Total Body Water % 40.7 41.2 41.4 40.4 40.5 39.6  Muscle-Mass lbs 31.5 31.4 31.4 29.3 30.7 30.9  BMI 43.8 41.5 41.1 42.7 44.1 48.2  Body Fat Displacement               Torso  lbs 80 74.1 73 77 79.2 89.9         Left Leg  lbs 16 14.8 14.6 15.4 15.8 17.9         Right Leg  lbs 16 14.8 14.6 15.4 15.8 17.9         Left Arm  lbs 8 7.4 7.3 7.7 7.9 8.9         Right Arm   lbs 8 7.4 7.3 7.7 7.9 8.9    Pt states she needs to limit her activity due to her heart rate for safety.  Pt states she got a machine that moves her feet for her while sitting.    24 hr recall: Breakfast: premier protein shake + lemon and ginger juice  Snack 11:30: graham crackers Lunch 1pm: baked spaghetti and garlic bread with cheese Snack:  Dinner: chicken wings and collards  Fluid intake: water 60-80 fluid ounces, sugar free jello, sometimes unsweet tea, sometimes orange juice, cranberry juice, water + flavorings, ginger and lemon in water  Medications: See List Supplementation: bari multi: celebrate chewable and calcium     Using straws: no Drinking while eating: no Having you been chewing well: yes Chewing/swallowing difficulties: no Changes in vision: no Changes to mood/headaches: no Hair loss/Cahnges to skin/Changes to nails: no Any difficulty focusing or concentrating: no Sweating: no Dizziness/Lightheaded: no Palpitations: no  Carbonated beverages: no N/V/D/C/GAS: no Abdominal Pain: no Dumping syndrome: no  Recent physical  activity: ADL's due to HR and surgery   Progress Towards Goal(s):  In Progress Teaching method utilized: Visual & Auditory  Demonstrated degree of understanding via: Teach Back  Readiness Level: Action Barriers to learning/adherence to lifestyle change: none identified   Goals: Stop drinking juice Do not have bread with your meals Stop eating crackers Snack: fruit, cheese, vegetables, nuts, seeds, low sugar yogurt Always have a non starchy vegetable with lunch and dinner  Have 1 piece of fruit with protein shake in the morning   Teaching Method Utilized:  Visual Auditory Hands on  Demonstrated degree of understanding via:  Teach Back   Monitoring/Evaluation:  Dietary intake, exercise, and body weight.

## 2022-10-01 DIAGNOSIS — H0102A Squamous blepharitis right eye, upper and lower eyelids: Secondary | ICD-10-CM | POA: Diagnosis not present

## 2022-10-01 DIAGNOSIS — H401232 Low-tension glaucoma, bilateral, moderate stage: Secondary | ICD-10-CM | POA: Diagnosis not present

## 2022-10-01 DIAGNOSIS — H5010 Unspecified exotropia: Secondary | ICD-10-CM | POA: Diagnosis not present

## 2022-10-01 DIAGNOSIS — H0102B Squamous blepharitis left eye, upper and lower eyelids: Secondary | ICD-10-CM | POA: Diagnosis not present

## 2022-10-01 DIAGNOSIS — H35033 Hypertensive retinopathy, bilateral: Secondary | ICD-10-CM | POA: Diagnosis not present

## 2022-10-01 DIAGNOSIS — Z961 Presence of intraocular lens: Secondary | ICD-10-CM | POA: Diagnosis not present

## 2022-10-02 ENCOUNTER — Other Ambulatory Visit (HOSPITAL_COMMUNITY)
Admission: RE | Admit: 2022-10-02 | Discharge: 2022-10-02 | Disposition: A | Discharge status: Home / Self Care | Payer: Medicare HMO | Source: Ambulatory Visit | Attending: Family Medicine | Admitting: Family Medicine

## 2022-10-02 ENCOUNTER — Ambulatory Visit (INDEPENDENT_AMBULATORY_CARE_PROVIDER_SITE_OTHER): Payer: Medicare HMO | Admitting: Family Medicine

## 2022-10-02 ENCOUNTER — Encounter: Payer: Self-pay | Admitting: Family Medicine

## 2022-10-02 VITALS — BP 104/72 | HR 74 | Ht 65.0 in | Wt 293.6 lb

## 2022-10-02 DIAGNOSIS — Z113 Encounter for screening for infections with a predominantly sexual mode of transmission: Secondary | ICD-10-CM | POA: Insufficient documentation

## 2022-10-02 DIAGNOSIS — Z01419 Encounter for gynecological examination (general) (routine) without abnormal findings: Secondary | ICD-10-CM | POA: Insufficient documentation

## 2022-10-02 NOTE — Progress Notes (Signed)
Pt. Presents for AEX exam

## 2022-10-02 NOTE — Progress Notes (Signed)
GYNECOLOGY OFFICE VISIT NOTE  History:   Carla Sloan is a 62 y.o. [phone] here today for her annual gynecological exam. She denies any abnormal vaginal discharge, bleeding, pelvic pain or other concerns.  She desires to have STI screening   Past Medical History:  Diagnosis Date   Arthritis    Depression    Hypertension    Muscle spasms of lower extremity    and back   Tachycardia     Past Surgical History:  Procedure Laterality Date   ABDOMINOPLASTY/PANNICULECTOMY WITH LIPOSUCTION N/A 03/19/2022   Procedure: PANNICULECTOMY WITH LIPOSUCTION;  Surgeon: Peggye Form, DO;  Location:  [facility];  Service: Plastics;  Laterality: N/A;   COLONOSCOPY WITH PROPOFOL N/A 09/24/2012   Procedure: COLONOSCOPY WITH PROPOFOL;  Surgeon: Petra Kuba, MD;  Location: [facility];  Service: Endoscopy;  Laterality: N/A;   COLONOSCOPY WITH PROPOFOL N/A 05/19/2016   Procedure: COLONOSCOPY WITH PROPOFOL;  Surgeon: Vida Rigger, MD;  Location[facility];  Service: Endoscopy;  Laterality: N/A;   LAPAROSCOPIC GASTRIC SLEEVE RESECTION N/A 04/24/2020   Procedure: LAPAROSCOPIC GASTRIC SLEEVE RESECTION;  Surgeon: Berna Bue, MD;  Location: [facility];  Service: General;  Laterality: N/A;   NO PAST SURGERIES     RIGHT/LEFT HEART CATH AND CORONARY ANGIOGRAPHY N/A 07/04/2022   Procedure: RIGHT/LEFT HEART CATH AND CORONARY ANGIOGRAPHY;  Surgeon: Marykay Lex, MD;  Location: [facility] INVASIVE CV LAB;  Service: Cardiovascular;  Laterality: N/A;   TUBAL LIGATION     UPPER GI ENDOSCOPY N/A 04/24/2020   Procedure: UPPER GI ENDOSCOPY;  Surgeon: Berna Bue, MD;  Location: [facility];  Service: General;  Laterality: N/A;    The following portions of the patient's history were reviewed and updated as appropriate: allergies, current medications, past family history, past medical history, past social history, past surgical history and problem list.   Health Maintenance:  Normal pap and  negative HRHPV in 2022.  Next is due in 2027.  Normal mammogram on 06/2022.  Normal colonoscopy   Review of Systems:  Pertinent items noted in HPI and remainder of comprehensive ROS otherwise negative.  Physical Exam:  BP 104/72   Pulse 74   Ht 5\' 5"  (1.651 m)   Wt 293 lb 9.6 oz (133.2 kg)   LMP 09/25/2015 (Approximate)   BMI 48.86 kg/m  CONSTITUTIONAL: Well-developed, well-nourished female in no acute distress.  HEENT:  Normocephalic, atraumatic. External right and left ear normal. No scleral icterus.  NECK: Normal range of motion, supple, no masses noted on observation SKIN: No rash noted. Not diaphoretic. No erythema. No pallor. BREAST: Exam performed with a chaperone.  Breasts are symmetrical bilaterally.  No skin changes.  No abnormal lumps on palpation, no nipple discharge.  No lymph nodes within the bilateral axilla. MUSCULOSKELETAL: Normal range of motion. No edema noted. NEUROLOGIC: Alert and oriented to person, place, and time. Normal muscle tone coordination. No cranial nerve deficit noted. PSYCHIATRIC: Normal mood and affect. Normal behavior. Normal judgment and thought content. CARDIOVASCULAR: Normal heart rate noted RESPIRATORY: Effort and breath sounds normal, no problems with respiration noted ABDOMEN: No masses noted. No other overt distention noted.   PELVIC: Normal appearing external genitalia; normal urethral meatus; normal appearing vaginal mucosa and cervix.  No abnormal discharge noted.  Normal uterine size, no other palpable masses, no uterine or adnexal tenderness. Performed in the presence of a chaperone  Labs and Imaging No results found for this or any previous visit (from the past 168  hour(s)). No results found.    Assessment and Plan:  Encounter for annual routine gynecological examination - Plan: RPR+HBsAg+HCVAb+..., Cervicovaginal ancillary only( [place])  Routine screening for STI (sexually transmitted infection) - Plan: RPR+HBsAg+HCVAb+...,  Cervicovaginal ancillary only( [place])  Doing well overall, no concerns with that today.  She is up-to-date on all her cancer screenings.  Breast exam and the rest of her physical exam is unremarkable. Swab sample collected, and labs ordered for full STI screening.  Routine preventative health maintenance measures emphasized. Please refer to After Visit Summary for other counseling recommendations.   No follow-ups on file.    I spent 20 minutes dedicated to the care of this patient including pre-visit review of records, face to face time with the patient discussing her conditions and treatments and post visit orders.  Sheppard Evens MD MPH OB Fellow, Faculty Practice [facility] 10/02/2022

## 2022-10-07 NOTE — Progress Notes (Unsigned)
Office Visit    Patient Name: Carla Sloan Date of Encounter: 10/07/2022  Primary Care Provider:  Avanell Shackleton, NP-C Primary Cardiologist:  None Primary Electrophysiologist: None   Past Medical History    Past Medical History:  Diagnosis Date   Arthritis    Depression    Hypertension    Muscle spasms of lower extremity    and back   Tachycardia    Past Surgical History:  Procedure Laterality Date   ABDOMINOPLASTY/PANNICULECTOMY WITH LIPOSUCTION N/A 03/19/2022   Procedure: PANNICULECTOMY WITH LIPOSUCTION;  Surgeon: Peggye Form, DO;  Location: [facility];  Service: Plastics;  Laterality: N/A;   COLONOSCOPY WITH PROPOFOL N/A 09/24/2012   Procedure: COLONOSCOPY WITH PROPOFOL;  Surgeon: Petra Kuba, MD;  Location: [facility];  Service: Endoscopy;  Laterality: N/A;   COLONOSCOPY WITH PROPOFOL N/A 05/19/2016   Procedure: COLONOSCOPY WITH PROPOFOL;  Surgeon: Vida Rigger, MD;  Location: [facility] ENDOSCOPY;  Service: Endoscopy;  Laterality: N/A;   LAPAROSCOPIC GASTRIC SLEEVE RESECTION N/A 04/24/2020   Procedure: LAPAROSCOPIC GASTRIC SLEEVE RESECTION;  Surgeon: Berna Bue, MD;  Location: [facility];  Service: General;  Laterality: N/A;   NO PAST SURGERIES     RIGHT/LEFT HEART CATH AND CORONARY ANGIOGRAPHY N/A 07/04/2022   Procedure: RIGHT/LEFT HEART CATH AND CORONARY ANGIOGRAPHY;  Surgeon: Marykay Lex, MD;  Location: [facility]IVE CV [facility];  Service: Cardiovascular;  Laterality: N/A;   TUBAL LIGATION     UPPER GI ENDOSCOPY N/A 04/24/2020   Procedure: UPPER GI ENDOSCOPY;  Surgeon: Berna Bue, MD;  Location: [facility];  Service: General;  Laterality: N/A;    Allergies  No Known Allergies   History of Present Illness    Carla Sloan is a 62 y.o. 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   [name] was seen initially by Dr. Anne Fu on 04/30/2022 for complaint of sinus tachycardia. During ED visit pulse was 153 with a narrow complex sinus tachycardia noted. CT of the chest was completed and was negative. She was started on metoprolol 50 mg twice daily and 2D echo was completed that showed EF of 45-50% with mildly decreased LV function, moderately dilated LA and mildly dilated RA. R/LHC showed normal coronaries and moderately elevated EDP with preserved cardiac output.  Since last being seen in the office patient reports she has been doing well with no new cardiac complaints since previous visit.  She is currently recovering from a surgical procedure reports no episodes of sustained tachycardia.  She does note taking an additional dose of metoprolol states that when her heart rate increases it last for approximately 5 minutes.  Her blood pressure today is well-controlled at 104/80 and heart rate is 86 bpm.  She reports compliance with her current medication regimen and denies any adverse reactions.  She is euvolemic on examination today.  She currently has limited her physical activity due to her elevated heart rate but is planning to get back in the pool at the [facility] after she fully recovers from her previous surgical intervention. Patient denies chest pain, palpitations, dyspnea, PND, orthopnea, nausea, vomiting, dizziness, syncope, edema, weight gain, or early satiety.  Home Medications    Current Outpatient Medications  Medication Sig Dispense Refill   acetaminophen (TYLENOL) 500 MG tablet Take 1 tablet (500 mg total) by mouth every 6 (six)  hours as needed. (Patient taking differently: Take 500 mg by mouth every 6 (six) hours as needed for mild pain, fever or headache.) 30 tablet 0   bimatoprost (LUMIGAN) 0.03 % ophthalmic solution Place 1 drop into both eyes at bedtime.     Blood Pressure Monitoring (BLOOD PRESSURE CUFF) MISC 1 each by Does not apply route daily. 1 each 0    brimonidine-timolol (COMBIGAN) 0.2-0.5 % ophthalmic solution Place 1 drop into both eyes every 12 (twelve) hours.     calcium citrate-vitamin D (CELEBRATE CALCIUM CITRATE) 500-500 MG-UNIT chewable tablet Chew 1 tablet by mouth every evening.     citalopram (CELEXA) 40 MG tablet Take 40 mg by mouth daily.     cyclobenzaprine (FLEXERIL) 10 MG tablet Take 10 mg by mouth 3 (three) times daily.     ferrous sulfate 324 MG TBEC Take 324 mg by mouth daily with breakfast.     lidocaine (LIDODERM) 5 % Place 1 patch onto the skin daily. Remove & Discard patch within 12 hours or as directed by MD 30 patch 0   Lurasidone HCl (LATUDA) 60 MG TABS Take 60 mg by mouth at bedtime.     metoprolol tartrate (LOPRESSOR) 50 MG tablet Take 1 tablet (50 mg total) by mouth 2 (two) times daily. Can take an additional tablet as needed if heart rate over 100 225 tablet 3   metroNIDAZOLE (FLAGYL) 500 MG tablet Take 1 tablet (500 mg total) by mouth 2 (two) times daily. 14 tablet 0   Multiple Vitamins-Minerals (CELEBRATE MULTI-COMPLETE 45) CHEW Chew 1 each by mouth 3 (three) times daily.     ondansetron (ZOFRAN) 4 MG tablet Take 1 tablet (4 mg total) by mouth every 8 (eight) hours as needed for nausea or vomiting. 20 tablet 0   orphenadrine (NORFLEX) 100 MG tablet Take 1 tablet (100 mg total) by mouth 2 (two) times daily. 30 tablet 0   oxyCODONE-acetaminophen (PERCOCET) 10-325 MG tablet Take 1 tablet by mouth twice a day as needed. (Patient taking differently: Take 1 tablet by mouth 3 (three) times daily as needed for pain.) 60 tablet 0   pravastatin (PRAVACHOL) 20 MG tablet Take 1 tablet (20 mg total) by mouth daily. Follow-up appt due in August w/lipid must see provider for future refills 90 tablet 0   RESTASIS 0.05 % ophthalmic emulsion Place 1 drop into both eyes 2 (two) times daily.     sacubitril-valsartan (ENTRESTO) 49-51 MG Take 1 tablet by mouth 2 (two) times daily. 60 tablet 3   spironolactone (ALDACTONE) 25 MG tablet  Take 0.5 tablets (12.5 mg total) by mouth daily. 30 tablet 3   No current facility-administered medications for this visit.     Review of Systems  Please see the history of present illness.    (+) Palpitations  All other systems reviewed and are otherwise negative except as noted above.  Physical Exam    Wt Readings from Last 3 Encounters:  10/02/22 293 lb 9.6 oz (133.2 kg)  08/19/22 291 lb 11.2 oz (132.3 kg)  08/05/22 289 lb (131.1 kg)   ZO:XWRUE were no vitals filed for this visit.,There is no height or weight on file to calculate BMI.  Constitutional:      Appearance: Healthy appearance. Not in distress.  Neck:     Vascular: JVD normal.  Pulmonary:     Effort: Pulmonary effort is normal.     Breath sounds: No wheezing. No rales. Diminished in the bases Cardiovascular:     Normal  rate. Regular rhythm. Normal S1. Normal S2.      Murmurs: There is no murmur.  Edema:    Peripheral edema absent.  Abdominal:     Palpations: Abdomen is soft non tender. There is no hepatomegaly.  Skin:    General: Skin is warm and dry.  Neurological:     General: No focal deficit present.     Mental Status: Alert and oriented to person, place and time.     Cranial Nerves: Cranial nerves are intact.  EKG/LABS/ Recent Cardiac Studies    ECG personally reviewed by me today -none completed today  Lab Results  Component Value Date   WBC 4.8 06/30/2022   HGB 11.6 (L) 07/04/2022   HGB 11.6 (L) 07/04/2022   HCT 34.0 (L) 07/04/2022   HCT 34.0 (L) 07/04/2022   MCV 89 06/30/2022   PLT 288 06/30/2022   Lab Results  Component Value Date   CREATININE 0.72 07/16/2022   BUN 19 07/16/2022   NA 142 07/16/2022   K 4.2 07/16/2022   CL 103 07/16/2022   CO2 23 07/16/2022   Lab Results  Component Value Date   ALT 14 04/11/2022   AST 23 04/11/2022   ALKPHOS 80 04/11/2022   BILITOT 0.2 (L) 04/11/2022   Lab Results  Component Value Date   CHOL 163 01/09/2022   HDL 62.00 01/09/2022   LDLCALC  91 01/09/2022   TRIG 51.0 01/09/2022   CHOLHDL 3 01/09/2022    No results found for: "HGBA1C"   Assessment & Plan    1.HFmrEF: -2D echo recently completed showing EF of 45-50% -Today patient reports she has been doing well with no shortness of breath or lower extremity swelling. -She is euvolemic on examination today and reports compliance with her current medication regimen. -We will repeat 2D echo after 3 months of GDMT. -Continue GDMT with Entresto 49/51 mg twice daily, spironolactone 12.5 mg daily metoprolol 50 mg twice daily   2.  Nonobstructive CAD: -R/LHC performed that showed normal coronary arteries -Today patient reports no chest pain or shortness of breath with activity. -Continue current GDMT with Pravachol 20 mg -Patient is fasting and lipids and LFTs will be checked today with results forwarded to PCP   3. Pulmonary HTN: -Patient underwent R/LHC that showed elevated LVEDP with preserved cardiac index -She reports no shortness of breath with activity or exertion. -Today patient is euvolemic on exam and will continue current GDMT with Entresto, Aldactone, and metoprolol as noted above.  4.  Family history of heart disease: -Patient encouraged to abstain from excess salt and cholesterol in her diet. -Patient had R/LHC performed that showed clear coronary arteries with no obstructions.   5.History DVT: -She was previously on Eliquis for history of DVT.  She denies any recurrence of shortness of breath or lower extremity swelling. -She denies any prolonged sitting or sedentary episodes.   6.  Sinus tachycardia: -Today patient reports improvement to tachycardia and heart rate today is 86 bpm. -She was advised to continue as needed 50 mg dose of metoprolol for elevated heart rate.    Disposition: Follow-up with None or APP in 6 months    Medication Adjustments/Labs and Tests Ordered: Current medicines are reviewed at length with the patient today.  Concerns regarding  medicines are outlined above.   Signed, Napoleon Form, Leodis Rains, NP 10/07/2022, 1:06 PM [facility]

## 2022-10-09 ENCOUNTER — Encounter: Payer: Self-pay | Admitting: Nurse Practitioner

## 2022-10-09 ENCOUNTER — Ambulatory Visit: Payer: Medicare HMO | Attending: Nurse Practitioner | Admitting: Nurse Practitioner

## 2022-10-09 VITALS — BP 104/80 | HR 86 | Ht 65.0 in | Wt 294.6 lb

## 2022-10-09 DIAGNOSIS — I272 Pulmonary hypertension, unspecified: Secondary | ICD-10-CM

## 2022-10-09 DIAGNOSIS — Z8249 Family history of ischemic heart disease and other diseases of the circulatory system: Secondary | ICD-10-CM

## 2022-10-09 DIAGNOSIS — Z86718 Personal history of other venous thrombosis and embolism: Secondary | ICD-10-CM

## 2022-10-09 DIAGNOSIS — I5022 Chronic systolic (congestive) heart failure: Secondary | ICD-10-CM | POA: Diagnosis not present

## 2022-10-09 DIAGNOSIS — I2510 Atherosclerotic heart disease of native coronary artery without angina pectoris: Secondary | ICD-10-CM | POA: Diagnosis not present

## 2022-10-09 LAB — LIPID PANEL
Chol/HDL Ratio: 3.7 ratio (ref 0.0–4.4)
Cholesterol, Total: 205 mg/dL — ABNORMAL HIGH (ref 100–199)
HDL: 56 mg/dL (ref >39)
LDL Chol Calc (NIH): 135 mg/dL — ABNORMAL HIGH (ref 0–99)
Triglycerides: 78 mg/dL (ref 0–149)
VLDL Cholesterol Cal: 14 mg/dL (ref 5–40)

## 2022-10-09 LAB — HEPATIC FUNCTION PANEL
ALT: 22 IU/L (ref 0–32)
AST: 28 IU/L (ref 0–40)
Albumin: 4.3 g/dL (ref 3.9–4.9)
Alkaline Phosphatase: 95 IU/L (ref 44–121)
Bilirubin Total: 0.5 mg/dL (ref 0.0–1.2)
Bilirubin, Direct: 0.12 mg/dL (ref 0.00–0.40)
Total Protein: 7.3 g/dL (ref 6.0–8.5)

## 2022-10-09 NOTE — Patient Instructions (Addendum)
Medication Instructions:  Your physician recommends that you continue on your current medications as directed. Please refer to the Current Medication list given to you today. *If you need a refill on your cardiac medications before your next appointment, please call your pharmacy*   Lab Work: TODAY-LFTs & LIPID If you have labs (blood work) drawn today and your tests are completely normal, you will receive your results only by: MyChart Message (if you have MyChart) OR A paper copy in the mail If you have any lab test that is abnormal or we need to change your treatment, we will call you to review the results.   Testing/Procedures: Your physician has requested that you have an echocardiogram. Echocardiography is a painless test that uses sound waves to create images of your heart. It provides your doctor with information about the size and shape of your heart and how well your heart's chambers and valves are working. This procedure takes approximately one hour. There are no restrictions for this procedure. Please do NOT wear cologne, perfume, aftershave, or lotions (deodorant is allowed). Please arrive 15 minutes prior to your appointment time.   Follow-Up: At [facility], you and your health needs are our priority.  As part of our continuing mission to provide you with exceptional heart care, we have created designated Provider Care Teams.  These Care Teams include your primary Cardiologist (physician) and Advanced Practice Providers (APPs -  Physician Assistants and Nurse Practitioners) who all work together to provide you with the care you need, when you need it.  We recommend signing up for the patient portal called "MyChart".  Sign up information is provided on this After Visit Summary.  MyChart is used to connect with patients for Virtual Visits (Telemedicine).  Patients are able to view lab/test results, encounter notes, upcoming appointments, etc.  Non-urgent messages can be  sent to your provider as well.   To learn more about what you can do with MyChart, go to [email or web address].    Your next appointment:   6 month(s)  Provider:   Robin Searing, NP  Other Instructions

## 2022-10-10 ENCOUNTER — Other Ambulatory Visit: Payer: Self-pay

## 2022-10-10 DIAGNOSIS — F333 Major depressive disorder, recurrent, severe with psychotic symptoms: Secondary | ICD-10-CM | POA: Diagnosis not present

## 2022-10-10 DIAGNOSIS — E782 Mixed hyperlipidemia: Secondary | ICD-10-CM

## 2022-10-10 MED ORDER — ROSUVASTATIN CALCIUM 20 MG PO TABS: 20.0000 mg | ORAL_TABLET | Freq: Every day | ORAL | 1 refills | Status: DC

## 2022-10-15 ENCOUNTER — Encounter: Payer: Self-pay | Admitting: Skilled Nursing Facility1

## 2022-10-15 ENCOUNTER — Encounter: Payer: Medicare HMO | Attending: Surgery | Admitting: Skilled Nursing Facility1

## 2022-10-15 DIAGNOSIS — Z6841 Body Mass Index (BMI) 40.0 and over, adult: Secondary | ICD-10-CM | POA: Diagnosis not present

## 2022-10-15 NOTE — Progress Notes (Signed)
Follow-up visit:  Post-Operative sleeve Surgery   Anthropometrics  Surgery date: 04/24/2020 Surgery type: sleeve Start weight at [facility]: 411.3 pounds Weight today: pt declined   Labs: hemoglobin 10.7   Body Composition Scale 12/03/2020 03/05/2021 05/06/2021 12/02/2021 04/16/2022 08/19/2022  Current Body Weight 271.3 257.7 255.1 258.5 267.1 291.7  Total Body Fat % 47.5 46.4 46.1 48 47.8 49.6  Visceral Fat 19 18 18 19 20 23   Fat-Free Mass % 52.4 53.5 53.8 51.9 52.1 50.3   Total Body Water % 40.7 41.2 41.4 40.4 40.5 39.6  Muscle-Mass lbs 31.5 31.4 31.4 29.3 30.7 30.9  BMI 43.8 41.5 41.1 42.7 44.1 48.2  Body Fat Displacement               Torso  lbs 80 74.1 73 77 79.2 89.9         Left Leg  lbs 16 14.8 14.6 15.4 15.8 17.9         Right Leg  lbs 16 14.8 14.6 15.4 15.8 17.9         Left Arm  lbs 8 7.4 7.3 7.7 7.9 8.9         Right Arm   lbs 8 7.4 7.3 7.7 7.9 8.9    Pt states the wound form her surgery finally closed up. Pt states she got in the pool and is so happy about that. Pt states she has 5 grandchildren she watches.  Pt states she gets up and watches the grandkids and stays moving instead of just laying in the bed.   24 hr recall: Breakfast: premier protein shake + lemon and ginger juice + apple Snack 11:30:  Lunch 1pm: hot dog and salad Snack: apple Dinner: collards + corn and BBQ ribs  Fluid intake: water 60-80 fluid ounces, sugar free jello, sometimes unsweet tea, sometimes orange juice, cranberry juice, water + flavorings, ginger and lemon in water  Medications: See List Supplementation: bari multi: celebrate chewable and calcium     Using straws: no Drinking while eating: no Having you been chewing well: yes Chewing/swallowing difficulties: no Changes in vision: no Changes to mood/headaches: no Hair loss/Cahnges to skin/Changes to nails: no Any difficulty focusing or concentrating: no Sweating: no Dizziness/Lightheaded: no Palpitations: no  Carbonated  beverages: no N/V/D/C/GAS: no Abdominal Pain: no Dumping syndrome: no  Recent physical activity: Pool Monday thru Friday   Progress Towards Goal(s):  In Progress Teaching method utilized: Patent attorney & Auditory  Demonstrated degree of understanding via: Teach Back  Readiness Level: Action Barriers to learning/adherence to lifestyle change: none identified   Teaching Method Utilized:  Visual Auditory Hands on  Demonstrated degree of understanding via:  Teach Back   Monitoring/Evaluation:  Dietary intake, exercise, and body weight.

## 2022-10-21 ENCOUNTER — Ambulatory Visit: Payer: Medicare HMO | Admitting: Skilled Nursing Facility1

## 2022-10-24 ENCOUNTER — Telehealth: Payer: Self-pay | Admitting: Cardiology

## 2022-10-24 NOTE — Telephone Encounter (Signed)
Pt c/o medication issue:  1. Name of Medication:   sacubitril-valsartan (ENTRESTO) 49-51 MG    2. How are you currently taking this medication (dosage and times per day)?   3. Are you having a reaction (difficulty breathing--STAT)?   4. What is your medication issue?   Patient would like to confirm correct dose.

## 2022-10-24 NOTE — Telephone Encounter (Signed)
Patient wanted to confirm dosage of Entresto. Reviewed chart and confirmed patient is now on Entresto 49/51 mg BID. Patient verbalized understanding and had no questions.

## 2022-10-29 ENCOUNTER — Ambulatory Visit (HOSPITAL_COMMUNITY): Payer: Medicare HMO | Attending: Nurse Practitioner

## 2022-10-29 DIAGNOSIS — I5022 Chronic systolic (congestive) heart failure: Secondary | ICD-10-CM | POA: Diagnosis not present

## 2022-10-29 DIAGNOSIS — Z86718 Personal history of other venous thrombosis and embolism: Secondary | ICD-10-CM | POA: Diagnosis not present

## 2022-10-29 DIAGNOSIS — Z8249 Family history of ischemic heart disease and other diseases of the circulatory system: Secondary | ICD-10-CM | POA: Diagnosis not present

## 2022-10-29 DIAGNOSIS — I272 Pulmonary hypertension, unspecified: Secondary | ICD-10-CM | POA: Insufficient documentation

## 2022-10-29 DIAGNOSIS — I2510 Atherosclerotic heart disease of native coronary artery without angina pectoris: Secondary | ICD-10-CM | POA: Diagnosis not present

## 2022-10-29 LAB — ECHOCARDIOGRAM COMPLETE
Area-P 1/2: 3.42 cm2
S' Lateral: 3 cm

## 2022-11-04 ENCOUNTER — Encounter (HOSPITAL_COMMUNITY): Payer: Self-pay | Admitting: *Deleted

## 2022-11-04 ENCOUNTER — Ambulatory Visit (HOSPITAL_COMMUNITY)
Admission: EM | Admit: 2022-11-04 | Discharge: 2022-11-04 | Disposition: A | Discharge status: Home / Self Care | Payer: Medicare HMO | Attending: Emergency Medicine | Admitting: Emergency Medicine

## 2022-11-04 DIAGNOSIS — N898 Other specified noninflammatory disorders of vagina: Secondary | ICD-10-CM | POA: Diagnosis not present

## 2022-11-04 DIAGNOSIS — Z113 Encounter for screening for infections with a predominantly sexual mode of transmission: Secondary | ICD-10-CM | POA: Diagnosis not present

## 2022-11-04 LAB — HIV ANTIBODY (ROUTINE TESTING W REFLEX): HIV Screen 4th Generation wRfx: NONREACTIVE

## 2022-11-04 NOTE — ED Provider Notes (Signed)
[facility]    CSN: [id number] Arrival date & time: 11/04/22  1338      History   Chief Complaint Chief Complaint  Patient presents with   Vaginal Discharge   Vaginal Itching    HPI Carla Sloan is a 62 y.o. female.   Patient presents with vaginal itching and foul-smelling discharge x 2 days. Patient reports recent unprotected sexual intercourse. Denies abdominal pain, back pain, fever, and vaginal bleeding. Requesting STD screening.   Vaginal Discharge Associated symptoms: vaginal itching   Associated symptoms: no abdominal pain, no dysuria, no fever, no nausea and no vomiting   Vaginal Itching Pertinent negatives include no abdominal pain.    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   Pain in right foot 05/11/2020   Morbid obesity (HCC) 04/24/2020   Osteoarthritis of knee 12/10/2017   Osteoarthritis of left knee 12/10/2017   Pain in right knee 09/09/2017   Pain in left knee 09/09/2017    Past Surgical History:  Procedure Laterality Date   ABDOMINOPLASTY/PANNICULECTOMY WITH LIPOSUCTION N/A 03/19/2022   Procedure: PANNICULECTOMY WITH LIPOSUCTION;  Surgeon: Peggye Form, DO;  Location: [facility];  Service: Plastics;  Laterality: N/A;   COLONOSCOPY WITH PROPOFOL N/A 09/24/2012   Procedure: COLONOSCOPY WITH PROPOFOL;  Surgeon: Petra Kuba, MD;  Location: [facility];  Service: Endoscopy;  Laterality: N/A;   COLONOSCOPY WITH PROPOFOL N/A 05/19/2016   Procedure: COLONOSCOPY WITH PROPOFOL;  Surgeon: Vida Rigger, MD;  Location: [facility];  Service: Endoscopy;  Laterality: N/A;   LAPAROSCOPIC GASTRIC SLEEVE RESECTION N/A 04/24/2020   Procedure: LAPAROSCOPIC GASTRIC SLEEVE RESECTION;  Surgeon: Berna Bue, MD;  Location: [facility];  Service: General;  Laterality: N/A;   NO PAST SURGERIES     RIGHT/LEFT HEART CATH AND CORONARY ANGIOGRAPHY N/A 07/04/2022   Procedure: RIGHT/LEFT HEART CATH AND CORONARY ANGIOGRAPHY;  Surgeon: Marykay Lex, MD;  Location: [facility] CV [facility];  Service: Cardiovascular;  Laterality: N/A;   TUBAL LIGATION     UPPER GI ENDOSCOPY N/A 04/24/2020   Procedure: UPPER GI ENDOSCOPY;  Surgeon: Berna Bue, MD;  Location: [facility];  Service: General;  Laterality: N/A;    OB History     Gravida  2   Para  2   Term  2   Preterm      AB      Living  2      SAB      IAB      Ectopic      Multiple      Live Births  2            Home Medications    Prior to Admission medications   Medication Sig Start Date End Date Taking? Authorizing Provider  azelastine (OPTIVAR) 0.05 % ophthalmic solution Apply 1 drop to eye 2 (two) times daily. 08/13/22  Yes [provider]  bimatoprost (LUMIGAN) 0.03 % ophthalmic solution Place 1 drop into both eyes at bedtime.   Yes  [provider]  brimonidine-timolol (COMBIGAN) 0.2-0.5 % ophthalmic solution Place 1 drop into both eyes every 12 (twelve) hours.   Yes [provider]  calcium citrate-vitamin D (CELEBRATE CALCIUM CITRATE) 500-500 MG-UNIT chewable tablet Chew 1 tablet by mouth every evening.   Yes [provider]  citalopram (CELEXA) 40 MG tablet Take 40 mg by mouth daily.   Yes [provider]  cyclobenzaprine (FLEXERIL) 10 MG tablet Take 10 mg by mouth 3 (three) times daily. 11/12/21  Yes [provider]  ferrous sulfate 324 MG TBEC Take 324 mg by mouth daily with breakfast.   Yes [provider]  Lurasidone HCl (LATUDA) 60 MG TABS Take 60 mg by mouth  at bedtime.   Yes [provider]  metoprolol tartrate (LOPRESSOR) 50 MG tablet Take 1 tablet (50 mg total) by mouth 2 (two) times daily. Can take an additional tablet as needed if heart rate over 100 07/22/22  Yes Dick, Devoria Albe., NP  Multiple Vitamins-Minerals (CELEBRATE MULTI-COMPLETE 45) CHEW Chew 1 each by mouth 3 (three) times daily.   Yes [provider]  oxyCODONE-acetaminophen (PERCOCET) 10-325 MG tablet Take 1 tablet by mouth twice a day as needed. Patient taking differently: Take 1 tablet by mouth 3 (three) times daily as needed for pain. 08/05/21  Yes   RESTASIS 0.05 % ophthalmic emulsion Place 1 drop into both eyes 2 (two) times daily. 06/08/22  Yes [provider]  rosuvastatin (CRESTOR) 20 MG tablet Take 1 tablet (20 mg total) by mouth daily. 10/10/22 01/08/23 Yes Gaston Islam., NP  sacubitril-valsartan (ENTRESTO) 49-51 MG Take 1 tablet by mouth 2 (two) times daily. 07/08/22  Yes Gaston Islam., NP  acetaminophen (TYLENOL) 500 MG tablet Take 1 tablet (500 mg total) by mouth every 6 (six) hours as needed. Patient taking differently: Take 500 mg by mouth every 6 (six) hours as needed for mild pain, fever or headache. 05/11/20   Rushie Chestnut, PA-C  Blood Pressure Monitoring (BLOOD PRESSURE CUFF) MISC 1 each by Does not apply route daily. 07/08/22   Gaston Islam., NP  ondansetron (ZOFRAN) 4 MG tablet Take 1 tablet (4 mg total) by mouth every 8 (eight) hours as needed for nausea or vomiting. 03/07/22   Scheeler, Kermit Balo, PA-C  orphenadrine (NORFLEX) 100 MG tablet Take 1 tablet (100 mg total) by mouth 2 (two) times daily. 07/20/21   Dione Booze, MD    Family History Family History  Problem Relation Age of Onset   Hypertension Mother    Diabetes Mother    Breast cancer Mother    Diabetes Father    Hypertension Father     Social History Social History   Tobacco Use   Smoking status: Never   Smokeless tobacco: Never  Vaping Use    Vaping status: Never Used  Substance Use Topics   Alcohol use: No   Drug use: No     Allergies   Patient has no known allergies.   Review of Systems Review of Systems  Constitutional:  Negative for chills, fatigue and fever.  Gastrointestinal:  Negative for abdominal pain, blood in stool, diarrhea, nausea and vomiting.  Genitourinary:  Positive for vaginal discharge. Negative for difficulty urinating, dysuria, flank pain, genital sores, hematuria, pelvic pain, vaginal bleeding and vaginal pain.  Skin:  Negative for color change.  Neurological:  Negative for dizziness, weakness and light-headedness.     Physical Exam Triage Vital Signs ED Triage Vitals  Encounter Vitals Group     BP 11/04/22 1359 119/80     Systolic BP Percentile --      Diastolic BP Percentile --      Pulse Rate 11/04/22 1359 69     Resp 11/04/22 1359 20     Temp 11/04/22 1359 97.7 F (36.5 C)     Temp Source 11/04/22 1359 Oral     SpO2 11/04/22 1359 98 %     Weight --      Height --      Head Circumference --      Peak Flow --      Pain Score 11/04/22 1357 0     Pain Loc --      Pain Education --      Exclude from Growth Chart --    No data found.  Updated Vital Signs BP 119/80 (BP Location: Left Arm)   Pulse 69   Temp 97.7 F (36.5 C) (Oral)   Resp 20   LMP 09/25/2015 (Approximate)   SpO2 98%   Visual Acuity Right Eye Distance:   Left Eye Distance:   Bilateral Distance:    Right Eye Near:   Left Eye Near:    Bilateral Near:     Physical Exam Vitals and nursing note reviewed.  Constitutional:      General: She is awake. She is not in acute distress.    Appearance: Normal appearance. She is well-developed and well-groomed. She is not ill-appearing or toxic-appearing.  Cardiovascular:     Rate and Rhythm: Normal rate.     Heart sounds: Normal heart sounds.  Pulmonary:     Effort: Pulmonary effort is normal. No respiratory distress.     Breath sounds: Normal breath sounds. No  wheezing.  Abdominal:     General: Abdomen is flat. There is no distension.     Palpations: Abdomen is soft. There is no mass.     Tenderness: There is no abdominal tenderness. There is no right CVA tenderness, left CVA tenderness or guarding.  Genitourinary:    Comments: Exam deferred. Skin:    General: Skin is warm and dry.  Neurological:     Mental Status: She is alert.  Psychiatric:        Behavior: Behavior is cooperative.      UC Treatments / Results  Labs (all labs ordered are listed, but only abnormal results are displayed) Labs Reviewed  HIV ANTIBODY (ROUTINE TESTING W REFLEX)  RPR  CERVICOVAGINAL ANCILLARY ONLY    EKG   Radiology No results found.  Procedures Procedures (including critical care time)  Medications Ordered in UC Medications - No data to display  Initial Impression / Assessment and Plan / UC Course  I have reviewed the triage vital signs and the nursing notes.  Pertinent labs & imaging results that were available during my care of the patient were reviewed by me and considered in my medical decision making (see chart for details).     Patient presented with vaginal itching and foul-smelling discharge x 2 days. Reports recent unprotected sexual intercourse and is requesting STD screening. Denies abdominal pain, back pain, fever, and vaginal bleeding.  No significant findings upon exam. Ordered STD swab, HIV, and RPR and informed patient results would come back over the next few days. Discussed return and follow-up precautions. Final Clinical Impressions(s) / UC Diagnoses   Final diagnoses:  Screening for STD (sexually transmitted disease)  Vaginal discharge     Discharge Instructions  If your results come back positive someone will call you and prescribe medication if necessary. Follow-up with primary care doctor or return here as needed.     ED Prescriptions   None    PDMP not reviewed this encounter.   Wynonia Lawman A,  NP 11/04/22 1422

## 2022-11-04 NOTE — Discharge Instructions (Addendum)
If your results come back positive someone will call you and prescribe medication if necessary. Follow-up with primary care doctor or return here as needed.

## 2022-11-04 NOTE — ED Triage Notes (Signed)
Pt states she has some vaginal itching, discharge and a foul smell X couple days. He hasn't used meds OTC.

## 2022-11-05 ENCOUNTER — Telehealth (HOSPITAL_COMMUNITY): Payer: Self-pay | Admitting: Emergency Medicine

## 2022-11-05 LAB — CERVICOVAGINAL ANCILLARY ONLY
Bacterial Vaginitis (gardnerella): NEGATIVE
Candida Glabrata: NEGATIVE
Candida Vaginitis: NEGATIVE
Chlamydia: NEGATIVE
Comment: NEGATIVE
Comment: NEGATIVE
Comment: NEGATIVE
Comment: NEGATIVE
Comment: NEGATIVE
Comment: NORMAL
Neisseria Gonorrhea: NEGATIVE
Trichomonas: POSITIVE — AB

## 2022-11-05 LAB — RPR: RPR Ser Ql: NONREACTIVE

## 2022-11-05 MED ORDER — METRONIDAZOLE 500 MG PO TABS: 500.0000 mg | ORAL_TABLET | Freq: Two times a day (BID) | ORAL | 0 refills | Status: DC

## 2022-11-05 NOTE — Telephone Encounter (Signed)
Metronidazole for positive Trichomonas, see result for f/u

## 2022-11-27 DIAGNOSIS — Z5181 Encounter for therapeutic drug level monitoring: Secondary | ICD-10-CM | POA: Diagnosis not present

## 2022-11-27 DIAGNOSIS — G894 Chronic pain syndrome: Secondary | ICD-10-CM | POA: Diagnosis not present

## 2022-11-27 DIAGNOSIS — M170 Bilateral primary osteoarthritis of knee: Secondary | ICD-10-CM | POA: Diagnosis not present

## 2022-11-28 DIAGNOSIS — Z5181 Encounter for therapeutic drug level monitoring: Secondary | ICD-10-CM | POA: Diagnosis not present

## 2022-11-28 DIAGNOSIS — Z79899 Other long term (current) drug therapy: Secondary | ICD-10-CM | POA: Diagnosis not present

## 2022-12-05 ENCOUNTER — Other Ambulatory Visit: Payer: Self-pay | Admitting: Nurse Practitioner

## 2022-12-05 ENCOUNTER — Ambulatory Visit: Payer: Medicare HMO | Attending: Nurse Practitioner

## 2022-12-05 DIAGNOSIS — E782 Mixed hyperlipidemia: Secondary | ICD-10-CM

## 2022-12-06 LAB — HEPATIC FUNCTION PANEL
ALT: 23 [IU]/L (ref 0–32)
AST: 26 [IU]/L (ref 0–40)
Albumin: 4.3 g/dL (ref 3.9–4.9)
Alkaline Phosphatase: 91 [IU]/L (ref 44–121)
Bilirubin Total: 0.3 mg/dL (ref 0.0–1.2)
Bilirubin, Direct: 0.1 mg/dL (ref 0.00–0.40)
Total Protein: 7.2 g/dL (ref 6.0–8.5)

## 2022-12-06 LAB — LIPID PANEL
Chol/HDL Ratio: 3.1 {ratio} (ref 0.0–4.4)
Cholesterol, Total: 160 mg/dL (ref 100–199)
HDL: 52 mg/dL (ref >39)
LDL Chol Calc (NIH): 95 mg/dL (ref 0–99)
Triglycerides: 63 mg/dL (ref 0–149)
VLDL Cholesterol Cal: 13 mg/dL (ref 5–40)

## 2022-12-09 ENCOUNTER — Other Ambulatory Visit: Payer: Self-pay | Admitting: Family Medicine

## 2022-12-09 DIAGNOSIS — E785 Hyperlipidemia, unspecified: Secondary | ICD-10-CM

## 2022-12-18 ENCOUNTER — Other Ambulatory Visit: Payer: Self-pay | Admitting: Nurse Practitioner

## 2022-12-22 ENCOUNTER — Telehealth: Payer: Self-pay | Admitting: Cardiology

## 2022-12-22 MED ORDER — SPIRONOLACTONE 25 MG PO TABS: 12.5000 mg | ORAL_TABLET | Freq: Every day | ORAL | 3 refills | Status: DC

## 2022-12-22 NOTE — Telephone Encounter (Signed)
Patient is needing a refill on spironolactone. She takes 12.5 mg daily. It looks like this fell off of her medication list when she was in the ER back in September. She states that she has continued to take it. Will send in a refill.

## 2022-12-22 NOTE — Telephone Encounter (Signed)
Pt c/o medication issue:  1. Name of Medication: Unsure  2. How are you currently taking this medication (dosage and times per day)?   3. Are you having a reaction (difficulty breathing--STAT)?   4. What is your medication issue? Patient states that she needs a refill on the medication that she takes a half tab of daily. She does not recall the name, but states this needs to be refilled. Requesting call back to clarify medication. No longer has bottle.

## 2023-01-01 ENCOUNTER — Telehealth: Payer: Self-pay | Admitting: Family Medicine

## 2023-01-01 NOTE — Telephone Encounter (Signed)
Patient said her handicap placard expired this month. She would like to know if Carla Sloan can renew it for her. Best callback is [phone].

## 2023-01-02 DIAGNOSIS — F333 Major depressive disorder, recurrent, severe with psychotic symptoms: Secondary | ICD-10-CM | POA: Diagnosis not present

## 2023-01-05 NOTE — Telephone Encounter (Signed)
I do not see a copy that we have done this for her, would you like for me to call and get info to renew for her or schedule ov?

## 2023-01-06 NOTE — Telephone Encounter (Signed)
Called pt, form has been completed and placed up front for pickup. Pt notified.

## 2023-01-06 NOTE — Telephone Encounter (Signed)
Form has been picked up by pt's daughter.

## 2023-01-13 ENCOUNTER — Telehealth: Payer: Self-pay | Admitting: Family Medicine

## 2023-01-13 MED ORDER — CYCLOBENZAPRINE HCL 10 MG PO TABS: 10.0000 mg | ORAL_TABLET | Freq: Three times a day (TID) | ORAL | 1 refills | Status: DC

## 2023-01-13 NOTE — Telephone Encounter (Signed)
Called pt and she reports she takes this for muscle spasms and takes it 3x daily. Reports she does not take this and drive. Sent in rx

## 2023-01-13 NOTE — Telephone Encounter (Signed)
We have not filled this before, ok to send in?

## 2023-01-13 NOTE — Telephone Encounter (Signed)
Prescription Request  01/13/2023  LOV: 07/10/2022  What is the name of the medication or equipment? cyclobenzaprine (FLEXERIL) 10 MG tablet   Have you contacted your pharmacy to request a refill? Yes   Which pharmacy would you like this sent to?  [id number] [place], [place] - [address] AT [facility] OF [address] [place] [address] Phone: [phone] Fax: [phone]    Patient notified that their request is being sent to the clinical staff for review and that they should receive a response within 2 business days.   Please advise at Mobile [phone] (mobile)

## 2023-02-04 DIAGNOSIS — Z961 Presence of intraocular lens: Secondary | ICD-10-CM | POA: Diagnosis not present

## 2023-02-04 DIAGNOSIS — H5010 Unspecified exotropia: Secondary | ICD-10-CM | POA: Diagnosis not present

## 2023-02-04 DIAGNOSIS — H0102A Squamous blepharitis right eye, upper and lower eyelids: Secondary | ICD-10-CM | POA: Diagnosis not present

## 2023-02-04 DIAGNOSIS — H0102B Squamous blepharitis left eye, upper and lower eyelids: Secondary | ICD-10-CM | POA: Diagnosis not present

## 2023-02-04 DIAGNOSIS — H401232 Low-tension glaucoma, bilateral, moderate stage: Secondary | ICD-10-CM | POA: Diagnosis not present

## 2023-03-03 ENCOUNTER — Ambulatory Visit: Payer: Medicare HMO | Admitting: Skilled Nursing Facility1

## 2023-03-07 ENCOUNTER — Other Ambulatory Visit: Payer: Self-pay | Admitting: Family Medicine

## 2023-03-13 DIAGNOSIS — F333 Major depressive disorder, recurrent, severe with psychotic symptoms: Secondary | ICD-10-CM | POA: Diagnosis not present

## 2023-03-25 ENCOUNTER — Encounter: Payer: Self-pay | Admitting: Skilled Nursing Facility1

## 2023-03-25 ENCOUNTER — Encounter: Payer: Medicare HMO | Attending: Surgery | Admitting: Skilled Nursing Facility1

## 2023-03-25 DIAGNOSIS — Z6841 Body Mass Index (BMI) 40.0 and over, adult: Secondary | ICD-10-CM | POA: Insufficient documentation

## 2023-03-25 DIAGNOSIS — Z713 Dietary counseling and surveillance: Secondary | ICD-10-CM | POA: Diagnosis not present

## 2023-03-25 NOTE — Progress Notes (Signed)
Follow-up visit:  Post-Operative sleeve Surgery   Anthropometrics  Surgery date: 04/24/2020 Surgery type: sleeve Start weight at [facility]: 411.3 pounds Weight today: pt declined   Labs: WNL   Body Composition Scale 03/05/2021 05/06/2021 12/02/2021 04/16/2022 08/19/2022  Current Body Weight 257.7 255.1 258.5 267.1 291.7  Total Body Fat % 46.4 46.1 48 47.8 49.6  Visceral Fat 18 18 19 20 23   Fat-Free Mass % 53.5 53.8 51.9 52.1 50.3   Total Body Water % 41.2 41.4 40.4 40.5 39.6  Muscle-Mass lbs 31.4 31.4 29.3 30.7 30.9  BMI 41.5 41.1 42.7 44.1 48.2  Body Fat Displacement              Torso  lbs 74.1 73 77 79.2 89.9         Left Leg  lbs 14.8 14.6 15.4 15.8 17.9         Right Leg  lbs 14.8 14.6 15.4 15.8 17.9         Left Arm  lbs 7.4 7.3 7.7 7.9 8.9         Right Arm   lbs 7.4 7.3 7.7 7.9 8.9    Pt states she gets up and watches the grandkids and stays moving instead of just laying in the bed.  Pt states she is excited abut losing weight and getting closer to her 240 pound goal.  Pt states she is getting to the gym 3 days a week and will go more once her daughter gets her own car and stops needing to barrow hers. Pt states since her daughter moved out she is doing right by herself.  Pt states she quit drinking the soda and koolaid.   24 hr recall: Breakfast: protein shake and grapefruit Snack 11:30:  Lunch 1pm: tuna and crackers Snack: apple Dinner: baked chicken + collard greens + corn   Fluid intake: water 60-80 fluid ounces, sugar free jello, sometimes unsweet tea, sometimes orange juice, cranberry juice, water + flavorings, ginger and lemon in water  Medications: See List Supplementation: bari multi: celebrate chewable and calcium     Using straws: no Drinking while eating: no Having you been chewing well: yes Chewing/swallowing difficulties: no Changes in vision: no Changes to mood/headaches: no Hair loss/Cahnges to skin/Changes to nails: no Any difficulty focusing  or concentrating: no Sweating: no Dizziness/Lightheaded: no Palpitations: no  Carbonated beverages: no N/V/D/C/GAS: no Abdominal Pain: no Dumping syndrome: no  Recent physical activity: Pool Monday thru Friday   Progress Towards Goal(s):  In Progress Teaching method utilized: Patent attorney & Auditory  Demonstrated degree of understanding via: Teach Back  Readiness Level: Action Barriers to learning/adherence to lifestyle change: none identified   Teaching Method Utilized:  Visual Auditory Hands on  Demonstrated degree of understanding via:  Teach Back   Monitoring/Evaluation:  Dietary intake, exercise, and body weight.

## 2023-03-31 ENCOUNTER — Other Ambulatory Visit: Payer: Self-pay | Admitting: Nurse Practitioner

## 2023-03-31 DIAGNOSIS — M170 Bilateral primary osteoarthritis of knee: Secondary | ICD-10-CM | POA: Diagnosis not present

## 2023-03-31 DIAGNOSIS — G894 Chronic pain syndrome: Secondary | ICD-10-CM | POA: Diagnosis not present

## 2023-03-31 DIAGNOSIS — Z5181 Encounter for therapeutic drug level monitoring: Secondary | ICD-10-CM | POA: Diagnosis not present

## 2023-04-01 ENCOUNTER — Other Ambulatory Visit: Payer: Self-pay | Admitting: Nurse Practitioner

## 2023-04-06 NOTE — Progress Notes (Deleted)
 Cardiology Office Note    Patient Name: Carla Sloan Date of Encounter: 04/06/2023  Primary Care Provider:  Avanell Shackleton, NP-C Primary Cardiologist:  None Primary Electrophysiologist: None   Past Medical History    Past Medical History:  Diagnosis Date   Arthritis    Depression    Hypertension    Muscle spasms of lower extremity    and back   Tachycardia     History of Present Illness  Carla Sloan is a 63 y.o. 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  During visit patient reported no episodes of tachycardia and was tolerating her medications without any adverse reactions.  She underwent a repeat 2D echo that showed improvement to EF at 50-55% up from 40-45%.  There were no other changes made to her medication regimen at that time.   During today's visit the patient reports*** .  Patient denies chest pain, palpitations, dyspnea, PND, orthopnea, nausea, vomiting, dizziness, syncope, edema, weight gain, or early satiety.  ***Notes: -Last ischemic evaluation: -Last echo: -Interim ED visits: Review of Systems  Please see the history of present illness.    All other systems reviewed and are otherwise negative except as noted above.  Physical Exam    Wt Readings from Last 3 Encounters:  10/09/22 294 lb 9.6 oz (133.6 kg)  10/02/22 293 lb 9.6 oz (133.2 kg)  08/19/22 291 lb 11.2 oz (132.3 kg)   UV:OZDGU were no vitals filed for this visit.,There is no height or weight on file to calculate BMI. GEN: Well nourished, well developed in no acute distress Neck: No JVD; No carotid bruits Pulmonary: Clear to auscultation without rales, wheezing or rhonchi  Cardiovascular: Normal rate. Regular rhythm. Normal S1. Normal S2.   Murmurs:  There is no murmur.  ABDOMEN: Soft, non-tender, non-distended EXTREMITIES:  No edema; No deformity   EKG/LABS/ Recent Cardiac Studies   ECG personally reviewed by me today - ***  Risk Assessment/Calculations:   {Does this patient have ATRIAL FIBRILLATION?[phone]}      Lab Results  Component Value Date   WBC 4.8 06/30/2022   HGB 11.6 (L) 07/04/2022   HGB 11.6 (L) 07/04/2022   HCT 34.0 (L) 07/04/2022   HCT 34.0 (L) 07/04/2022   MCV 89 06/30/2022   PLT 288 06/30/2022   Lab Results  Component Value Date   CREATININE 0.72 07/16/2022   BUN 19 07/16/2022   NA 142 07/16/2022   K 4.2 07/16/2022   CL 103 07/16/2022   CO2 23 07/16/2022   Lab Results  Component Value Date   CHOL 160 12/05/2022   HDL 52 12/05/2022   LDLCALC 95 12/05/2022   TRIG 63 12/05/2022   CHOLHDL 3.1 12/05/2022    No results found for: "HGBA1C" Assessment & Plan    1.HFimEF  2.Nonobstructive CAD: -R/LHC performed that showed normal coronary arteries -Today patient reports  3.  Sinus tachycardia  4.Pulmonary HTN: -Patient underwent R/LHC that showed elevated LVEDP with preserved cardiac index  5.History DVT: -She was previously on Eliquis for history of DVT.  She denies any recurrence of shortness of breath or lower extremity swelling.      Disposition: Follow-up with None or APP in *** months {Are you ordering a CV Procedure (e.g. stress test, cath, DCCV, TEE, etc)?   Press  F2        :[id number]}   Signed, Napoleon Form, Leodis Rains, NP 04/06/2023, 12:53 PM [facility]

## 2023-04-07 ENCOUNTER — Ambulatory Visit: Payer: Medicare HMO | Admitting: Nurse Practitioner

## 2023-04-07 DIAGNOSIS — I5030 Unspecified diastolic (congestive) heart failure: Secondary | ICD-10-CM

## 2023-04-07 DIAGNOSIS — R000 Tachycardia, unspecified: Secondary | ICD-10-CM

## 2023-04-07 DIAGNOSIS — Z86718 Personal history of other venous thrombosis and embolism: Secondary | ICD-10-CM

## 2023-04-07 DIAGNOSIS — I272 Pulmonary hypertension, unspecified: Secondary | ICD-10-CM

## 2023-04-07 DIAGNOSIS — I2510 Atherosclerotic heart disease of native coronary artery without angina pectoris: Secondary | ICD-10-CM

## 2023-04-09 ENCOUNTER — Other Ambulatory Visit: Payer: Self-pay | Admitting: Family Medicine

## 2023-04-09 DIAGNOSIS — Z1231 Encounter for screening mammogram for malignant neoplasm of breast: Secondary | ICD-10-CM

## 2023-04-21 ENCOUNTER — Encounter: Payer: Self-pay | Admitting: *Deleted

## 2023-04-21 NOTE — Progress Notes (Unsigned)
 Cardiology Office Note    Patient Name: Carla Sloan Date of Encounter: 04/21/2023  Primary Care Provider:  Avanell Shackleton, NP-C Primary Cardiologist:  None Primary Electrophysiologist: None   Past Medical History    Past Medical History:  Diagnosis Date   Arthritis    Depression    Hypertension    Muscle spasms of lower extremity    and back   Tachycardia     History of Present Illness  Carla Sloan is a 63 y.o. 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  Carla Sloan was last seen on 10/09/2022 for follow-up visit.  During visit patient reported no episodes of tachycardia and was tolerating her medications without any adverse reactions.  She underwent a repeat 2D echo that showed improvement to EF at 50-55% up from 40-45%.  There were no other changes made to her medication regimen at that time.  Carla Sloan presents today for 61-month follow-up.  She reports since her previous follow-up that she has been doing well with no new cardiac complaints.  She reports compliance with her current medication regimen and denies any adverse reactions.  Her blood pressure today is stable at 118/74 and heart rate is 77 bpm.  She denies any episodes of tachycardia since her previous follow-up.  She is still being followed by healthy weight and wellness and is continuing to follow a heart healthy diet. Patient denies chest pain, palpitations, dyspnea, PND, orthopnea, nausea, vomiting, dizziness, syncope, edema, weight gain, or early satiety.  Review of Systems  Please see the history of present illness.    All other systems reviewed and are otherwise negative except as noted above.  Physical Exam    Wt Readings from Last 3 Encounters:  10/09/22 294 lb 9.6 oz (133.6 kg)  10/02/22 293 lb 9.6 oz (133.2 kg)   08/19/22 291 lb 11.2 oz (132.3 kg)   ZO:XWRUE were no vitals filed for this visit.,There is no height or weight on file to calculate BMI. GEN: Well nourished, well developed in no acute distress Neck: No JVD; No carotid bruits Pulmonary: Clear to auscultation without rales, wheezing or rhonchi  Cardiovascular: Normal rate. Regular rhythm. Normal S1. Normal S2.   Murmurs: There is no murmur.  ABDOMEN: Soft, non-tender, non-distended EXTREMITIES:  No edema; No deformity   EKG/LABS/ Recent Cardiac Studies   ECG personally reviewed by me today -none completed today  Risk Assessment/Calculations:     Lab Results  Component Value Date   WBC 4.8 06/30/2022   HGB 11.6 (L) 07/04/2022   HGB 11.6 (L) 07/04/2022   HCT 34.0 (L) 07/04/2022   HCT 34.0 (L) 07/04/2022   MCV 89 06/30/2022   PLT 288 06/30/2022   Lab Results  Component Value Date   CREATININE 0.72 07/16/2022   BUN 19 07/16/2022   NA 142 07/16/2022   K 4.2 07/16/2022   CL 103 07/16/2022   CO2 23 07/16/2022   Lab Results  Component Value Date   CHOL 160 12/05/2022   HDL 52 12/05/2022   LDLCALC 95 12/05/2022   TRIG 63 12/05/2022   CHOLHDL 3.1 12/05/2022    No results found for: "HGBA1C" Assessment & Plan    1.HFimEF: -2D echo was completed showing improved EF of 50-55% with global hypokinesis and grade 1 DD with low normal RV systolic function and normal PASP pressure. -Today patient  is euvolemic on examination with no complaints of shortness of breath. -We will optimize her GDMT and add Jardiance 10 mg daily to current regimen. -Continue Entresto 49/51 mg twice daily, spironolactone 12.5 mg twice daily, metoprolol 50 mg twice daily -Low sodium diet, fluid restriction <2L, and daily weights encouraged. Educated to contact our office for weight gain of 2 lbs overnight or 5 lbs in one week.   2.Nonobstructive CAD: -R/LHC performed that showed normal coronary arteries -Today patient reports no chest pain or angina  since previous follow-up -Continue metoprolol 25 mg twice daily, Crestor 20 mg daily -We will plan to add ASA 81 mg at next follow-up.  3.  Sinus tachycardia: -Today patient's heart rate is stable at 77 bpm -Patient advised to avoid triggers such as caffeine, alcohol, and stress  4.Pulmonary HTN: -Patient underwent R/LHC that showed elevated LVEDP with preserved cardiac index -Repeat 2D echo completed 10/29/2022 showing normal PASP pressure  5.History DVT: -She was previously on Eliquis for history of DVT.  She denies any recurrence of shortness of breath or lower extremity swelling.  6.  Hyperlipidemia: -Patient's last LDL cholesterol was above goal at 135 -We will check LFTs and lipids when fasting and plan to increase Crestor if indicated. -Continue Crestor 20 mg daily  Disposition: Follow-up with None or APP in 6 months   Signed, Napoleon Form, Leodis Rains, NP 04/21/2023, 11:14 AM [facility]

## 2023-04-22 ENCOUNTER — Ambulatory Visit: Payer: Medicare HMO | Attending: Nurse Practitioner | Admitting: Nurse Practitioner

## 2023-04-22 ENCOUNTER — Encounter: Payer: Self-pay | Admitting: Nurse Practitioner

## 2023-04-22 VITALS — BP 118/74 | HR 77 | Ht 65.0 in | Wt 294.6 lb

## 2023-04-22 DIAGNOSIS — R000 Tachycardia, unspecified: Secondary | ICD-10-CM | POA: Diagnosis not present

## 2023-04-22 DIAGNOSIS — Z86718 Personal history of other venous thrombosis and embolism: Secondary | ICD-10-CM

## 2023-04-22 DIAGNOSIS — I2510 Atherosclerotic heart disease of native coronary artery without angina pectoris: Secondary | ICD-10-CM | POA: Diagnosis not present

## 2023-04-22 DIAGNOSIS — E785 Hyperlipidemia, unspecified: Secondary | ICD-10-CM | POA: Diagnosis not present

## 2023-04-22 DIAGNOSIS — I5022 Chronic systolic (congestive) heart failure: Secondary | ICD-10-CM | POA: Diagnosis not present

## 2023-04-22 DIAGNOSIS — I272 Pulmonary hypertension, unspecified: Secondary | ICD-10-CM

## 2023-04-22 MED ORDER — EMPAGLIFLOZIN 10 MG PO TABS: 10.0000 mg | ORAL_TABLET | Freq: Every day | ORAL | 0 refills | Status: DC

## 2023-04-22 MED ORDER — EMPAGLIFLOZIN 10 MG PO TABS: 10.0000 mg | ORAL_TABLET | Freq: Every day | ORAL | 2 refills | Status: DC

## 2023-04-22 NOTE — Patient Instructions (Addendum)
 Medication Instructions:  START Jardiance 10 mg  Samples given today of Jardiance 10 mg   *If you need a refill on your cardiac medications before your next appointment, please call your pharmacy*   Lab Work IN 2 WEEKS : BMP LFT Lipid panel   If you have labs (blood work) drawn today and your tests are completely normal, you will receive your results only by: MyChart Message (if you have MyChart) OR A paper copy in the mail If you have any lab test that is abnormal or we need to change your treatment, we will call you to review the results.   Follow-Up: At [facility], you and your health needs are our priority.  As part of our continuing mission to provide you with exceptional heart care, we have created designated Provider Care Teams.  These Care Teams include your primary Cardiologist (physician) and Advanced Practice Providers (APPs -  Physician Assistants and Nurse Practitioners) who all work together to provide you with the care you need, when you need it.   Your next appointment:   6 month(s)  Provider:   Dr. Anne Fu     Other Instructions Heart-Healthy Eating Plan Eating a healthy diet is important for the health of your heart. A heart-healthy eating plan includes: Eating less unhealthy fats. Eating more healthy fats. Eating less salt in your food. Salt is also called sodium. Making other changes in your diet. Talk with your doctor or a diet specialist (dietitian) to create an eating plan that is right for you. What is my plan? Your doctor may recommend an eating plan that includes: Total fat: ______% or less of total calories a day. Saturated fat: ______% or less of total calories a day. Cholesterol: less than _________mg a day. Sodium: less than _________mg a day. What are tips for following this plan? Cooking Avoid frying your food. Try to bake, boil, grill, or broil it instead. You can also reduce fat by: Removing the skin from poultry. Removing all  visible fats from meats. Steaming vegetables in water or broth. Meal planning  At meals, divide your plate into four equal parts: Fill one-half of your plate with vegetables and green salads. Fill one-fourth of your plate with whole grains. Fill one-fourth of your plate with lean protein foods. Eat 2-4 cups of vegetables per day. One cup of vegetables is: 1 cup (91 g) broccoli or cauliflower florets. 2 medium carrots. 1 large bell pepper. 1 large sweet potato. 1 large tomato. 1 medium white potato. 2 cups (150 g) raw leafy greens. Eat 1-2 cups of fruit per day. One cup of fruit is: 1 small apple 1 large banana 1 cup (237 g) mixed fruit, 1 large orange,  cup (82 g) dried fruit, 1 cup (240 mL) 100% fruit juice. Eat more foods that have soluble fiber. These are apples, broccoli, carrots, beans, peas, and barley. Try to get 20-30 g of fiber per day. Eat 4-5 servings of nuts, legumes, and seeds per week: 1 serving of dried beans or legumes equals  cup (90 g) cooked. 1 serving of nuts is  oz (12 almonds, 24 pistachios, or 7 walnut halves). 1 serving of seeds equals  oz (8 g). General information Eat more home-cooked food. Eat less restaurant, buffet, and fast food. Limit or avoid alcohol. Limit foods that are high in starch and sugar. Avoid fried foods. Lose weight if you are overweight. Keep track of how much salt (sodium) you eat. This is important if you have  high blood pressure. Ask your doctor to tell you more about this. Try to add vegetarian meals each week. Fats Choose healthy fats. These include olive oil and canola oil, flaxseeds, walnuts, almonds, and seeds. Eat more omega-3 fats. These include salmon, mackerel, sardines, tuna, flaxseed oil, and ground flaxseeds. Try to eat fish at least 2 times each week. Check food labels. Avoid foods with trans fats or high amounts of saturated fat. Limit saturated fats. These are often found in animal products, such as meats,  butter, and cream. These are also found in plant foods, such as palm oil, palm kernel oil, and coconut oil. Avoid foods with partially hydrogenated oils in them. These have trans fats. Examples are stick margarine, some tub margarines, cookies, crackers, and other baked goods. What foods should I eat? Fruits All fresh, canned (in natural juice), or frozen fruits. Vegetables Fresh or frozen vegetables (raw, steamed, roasted, or grilled). Green salads. Grains Most grains. Choose whole wheat and whole grains most of the time. Rice and pasta, including brown rice and pastas made with whole wheat. Meats and other proteins Lean, well-trimmed beef, veal, pork, and lamb. Chicken and [place] without skin. All fish and shellfish. Wild duck, rabbit, pheasant, and venison. Egg whites or low-cholesterol egg substitutes. Dried beans, peas, lentils, and tofu. Seeds and most nuts. Dairy Low-fat or nonfat cheeses, including ricotta and mozzarella. Skim or 1% milk that is liquid, powdered, or evaporated. Buttermilk that is made with low-fat milk. Nonfat or low-fat yogurt. Fats and oils Non-hydrogenated (trans-free) margarines. Vegetable oils, including soybean, sesame, sunflower, olive, peanut, safflower, corn, canola, and cottonseed. Salad dressings or mayonnaise made with a vegetable oil. Beverages Mineral water. Coffee and tea. Diet carbonated beverages. Sweets and desserts Sherbet, gelatin, and fruit ice. Small amounts of dark chocolate. Limit all sweets and desserts. Seasonings and condiments All seasonings and condiments. The items listed above may not be a complete list of foods and drinks you can eat. Contact a dietitian for more options. What foods should I avoid? Fruits Canned fruit in heavy syrup. Fruit in cream or butter sauce. Fried fruit. Limit coconut. Vegetables Vegetables cooked in cheese, cream, or butter sauce. Fried vegetables. Grains Breads that are made with saturated or trans fats,  oils, or whole milk. Croissants. Sweet rolls. Donuts. High-fat crackers, such as cheese crackers. Meats and other proteins Fatty meats, such as hot dogs, ribs, sausage, bacon, rib-eye roast or steak. High-fat deli meats, such as salami and bologna. Caviar. Domestic duck and goose. Organ meats, such as liver. Dairy Cream, sour cream, cream cheese, and creamed cottage cheese. Whole-milk cheeses. Whole or 2% milk that is liquid, evaporated, or condensed. Whole buttermilk. Cream sauce or high-fat cheese sauce. Yogurt that is made from whole milk. Fats and oils Meat fat, or shortening. Cocoa butter, hydrogenated oils, palm oil, coconut oil, palm kernel oil. Solid fats and shortenings, including bacon fat, salt pork, lard, and butter. Nondairy cream substitutes. Salad dressings with cheese or sour cream. Beverages Regular sodas and juice drinks with added sugar. Sweets and desserts Frosting. Pudding. Cookies. Cakes. Pies. Milk chocolate or white chocolate. Buttered syrups. Full-fat ice cream or ice cream drinks. The items listed above may not be a complete list of foods and drinks to avoid. Contact a dietitian for more information. Summary Heart-healthy meal planning includes eating less unhealthy fats, eating more healthy fats, and making other changes in your diet. Eat a balanced diet. This includes fruits and vegetables, low-fat or nonfat dairy, lean protein, nuts  and legumes, whole grains, and heart-healthy oils and fats. This information is not intended to replace advice given to you by your health care provider. Make sure you discuss any questions you have with your health care provider. Document Revised: 03/18/2021 Document Reviewed: 03/18/2021 Elsevier Patient Education  2024 Elsevier Inc.   Low-Sodium Eating Plan Salt (sodium) helps you keep a healthy balance of fluids in your body. Too much sodium can raise your blood pressure. It can also cause fluid and waste to be held in your  body. Your health care provider or dietitian may recommend a low-sodium eating plan if you have high blood pressure (hypertension), kidney disease, liver disease, or heart failure. Eating less sodium can help lower your blood pressure and reduce swelling. It can also protect your heart, liver, and kidneys. What are tips for following this plan? Reading food labels  Check food labels for the amount of sodium per serving. If you eat more than one serving, you must multiply the listed amount by the number of servings. Choose foods with less than 140 milligrams (mg) of sodium per serving. Avoid foods with 300 mg of sodium or more per serving. Always check how much sodium is in a product, even if the label says "unsalted" or "no salt added." Shopping  Buy products labeled as "low-sodium" or "no salt added." Buy fresh foods. Avoid canned foods and pre-made or frozen meals. Avoid canned, cured, or processed meats. Buy breads that have less than 80 mg of sodium per slice. Cooking  Eat more home-cooked food. Try to eat less restaurant, buffet, and fast food. Try not to add salt when you cook. Use salt-free seasonings or herbs instead of table salt or sea salt. Check with your provider or pharmacist before using salt substitutes. Cook with plant-based oils, such as canola, sunflower, or olive oil. Meal planning When eating at a restaurant, ask if your food can be made with less salt or no salt. Avoid dishes labeled as brined, pickled, cured, or smoked. Avoid dishes made with soy sauce, miso, or teriyaki sauce. Avoid foods that have monosodium glutamate (MSG) in them. MSG may be added to some restaurant food, sauces, soups, bouillon, and canned foods. Make meals that can be grilled, baked, poached, roasted, or steamed. These are often made with less sodium. General information Try to limit your sodium intake to 1,500-2,300 mg each day, or the amount told by your provider. What foods should I  eat? Fruits Fresh, frozen, or canned fruit. Fruit juice. Vegetables Fresh or frozen vegetables. "No salt added" canned vegetables. "No salt added" tomato sauce and paste. Low-sodium or reduced-sodium tomato and vegetable juice. Grains Low-sodium cereals, such as oats, puffed wheat and rice, and shredded wheat. Low-sodium crackers. Unsalted rice. Unsalted pasta. Low-sodium bread. Whole grain breads and whole grain pasta. Meats and other proteins Fresh or frozen meat, poultry, seafood, and fish. These should have no added salt. Low-sodium canned tuna and salmon. Unsalted nuts. Dried peas, beans, and lentils without added salt. Unsalted canned beans. Eggs. Unsalted nut butters. Dairy Milk. Soy milk. Cheese that is naturally low in sodium, such as ricotta cheese, fresh mozzarella, or Swiss cheese. Low-sodium or reduced-sodium cheese. Cream cheese. Yogurt. Seasonings and condiments Fresh and dried herbs and spices. Salt-free seasonings. Low-sodium mustard and ketchup. Sodium-free salad dressing. Sodium-free light mayonnaise. Fresh or refrigerated horseradish. Lemon juice. Vinegar. Other foods Homemade, reduced-sodium, or low-sodium soups. Unsalted popcorn and pretzels. Low-salt or salt-free chips. The items listed above may not be all the  foods and drinks you can have. Talk to a dietitian to learn more. What foods should I avoid? Vegetables Sauerkraut, pickled vegetables, and relishes. Olives. [place] fries. Onion rings. Regular canned vegetables, except low-sodium or reduced-sodium items. Regular canned tomato sauce and paste. Regular tomato and vegetable juice. Frozen vegetables in sauces. Grains Instant hot cereals. Bread stuffing, pancake, and biscuit mixes. Croutons. Seasoned rice or pasta mixes. Noodle soup cups. Boxed or frozen macaroni and cheese. Regular salted crackers. Self-rising flour. Meats and other proteins Meat or fish that is salted, canned, smoked, spiced, or pickled. Precooked or  cured meat, such as sausages or meat loaves. Tomasa Blase. Ham. Pepperoni. Hot dogs. Corned beef. Chipped beef. Salt pork. Jerky. Pickled herring, anchovies, and sardines. Regular canned tuna. Salted nuts. Dairy Processed cheese and cheese spreads. Hard cheeses. Cheese curds. Blue cheese. Feta cheese. String cheese. Regular cottage cheese. Buttermilk. Canned milk. Fats and oils Salted butter. Regular margarine. Ghee. Bacon fat. Seasonings and condiments Onion salt, garlic salt, seasoned salt, table salt, and sea salt. Canned and packaged gravies. Worcestershire sauce. Tartar sauce. Barbecue sauce. Teriyaki sauce. Soy sauce, including reduced-sodium soy sauce. Steak sauce. Fish sauce. Oyster sauce. Cocktail sauce. Horseradish that you find on the shelf. Regular ketchup and mustard. Meat flavorings and tenderizers. Bouillon cubes. Hot sauce. Pre-made or packaged marinades. Pre-made or packaged taco seasonings. Relishes. Regular salad dressings. Salsa. Other foods Salted popcorn and pretzels. Corn chips and puffs. Potato and tortilla chips. Canned or dried soups. Pizza. Frozen entrees and pot pies. The items listed above may not be all the foods and drinks you should avoid. Talk to a dietitian to learn more. This information is not intended to replace advice given to you by your health care provider. Make sure you discuss any questions you have with your health care provider. Document Revised: 02/27/2022 Document Reviewed: 02/27/2022 Elsevier Patient Education  2024 Elsevier Inc.        1st Floor: - Lobby - Registration  - Pharmacy  - Lab - Cafe  2nd Floor: - PV Lab - Diagnostic Testing (echo, CT, nuclear med)  3rd Floor: - Vacant  4th Floor: - TCTS (cardiothoracic surgery) - AFib Clinic - Structural Heart Clinic - Vascular Surgery  - Vascular Ultrasound  5th Floor: - [facility] (general and EP) - Clinical Pharmacy for coumadin, hypertension, lipid, weight-loss medications,  and med management appointments    Valet parking services will be available as well.

## 2023-05-03 ENCOUNTER — Other Ambulatory Visit: Payer: Self-pay | Admitting: Family Medicine

## 2023-05-05 ENCOUNTER — Telehealth: Payer: Self-pay | Admitting: Nurse Practitioner

## 2023-05-05 DIAGNOSIS — I2510 Atherosclerotic heart disease of native coronary artery without angina pectoris: Secondary | ICD-10-CM | POA: Diagnosis not present

## 2023-05-05 DIAGNOSIS — I272 Pulmonary hypertension, unspecified: Secondary | ICD-10-CM | POA: Diagnosis not present

## 2023-05-05 DIAGNOSIS — I5022 Chronic systolic (congestive) heart failure: Secondary | ICD-10-CM | POA: Diagnosis not present

## 2023-05-05 DIAGNOSIS — Z86718 Personal history of other venous thrombosis and embolism: Secondary | ICD-10-CM | POA: Diagnosis not present

## 2023-05-05 DIAGNOSIS — R000 Tachycardia, unspecified: Secondary | ICD-10-CM | POA: Diagnosis not present

## 2023-05-05 LAB — BASIC METABOLIC PANEL
BUN/Creatinine Ratio: 20 (ref 12–28)
BUN: 26 mg/dL (ref 8–27)
CO2: 21 mmol/L (ref 20–29)
Calcium: 10.4 mg/dL — ABNORMAL HIGH (ref 8.7–10.3)
Chloride: 103 mmol/L (ref 96–106)
Creatinine, Ser: 1.29 mg/dL — ABNORMAL HIGH (ref 0.57–1.00)
Glucose: 107 mg/dL — ABNORMAL HIGH (ref 70–99)
Potassium: 5.9 mmol/L (ref 3.5–5.2)
Sodium: 141 mmol/L (ref 134–144)
eGFR: 47 mL/min/{1.73_m2} — ABNORMAL LOW (ref >59)

## 2023-05-05 LAB — LIPID PANEL
Chol/HDL Ratio: 2.7 ratio (ref 0.0–4.4)
Cholesterol, Total: 152 mg/dL (ref 100–199)
HDL: 57 mg/dL (ref >39)
LDL Chol Calc (NIH): 84 mg/dL (ref 0–99)
Triglycerides: 49 mg/dL (ref 0–149)
VLDL Cholesterol Cal: 11 mg/dL (ref 5–40)

## 2023-05-05 LAB — HEPATIC FUNCTION PANEL
ALT: 34 IU/L — ABNORMAL HIGH (ref 0–32)
AST: 41 IU/L — ABNORMAL HIGH (ref 0–40)
Albumin: 4.4 g/dL (ref 3.9–4.9)
Alkaline Phosphatase: 94 IU/L (ref 44–121)
Bilirubin Total: 0.3 mg/dL (ref 0.0–1.2)
Bilirubin, Direct: <0.08 mg/dL (ref 0.00–0.40)
Total Protein: 7.5 g/dL (ref 6.0–8.5)

## 2023-05-05 NOTE — Telephone Encounter (Signed)
 Was alerted by [facility] of a critical lab result.  Was able to reach Huel Cote by phone and confirmed name and date of birth.  Labsobtained 05/05/2023: K 5.9, creatinine 1.29 (baseline 0.7).  Last cardiology clinic note on 04/22/2023, was instructed to start Jardiance once daily.  She states that she has been taking this for about the past 2 weeks.  Her other home medications include Entresto 49-51 twice daily, spironolactone 12.5 mg once daily (among others) which she states she has been taking routinely.  States that BP has been normal recently with BP today in the 110s/60s.  Denies any new palpitations, heart racing, dizziness, lightheadedness, chest discomfort, shortness of breath, new edema.  She does not have a recent history of hyperkalemia.  No other medication changes.  I have instructed her to stop her Jardiance and spironolactone for the time being (she takes both in the morning).  I will alert her outpatient cardiology team for adjustments to GDMT and repeat labs in the next day or 2.  She was instructed to present to the emergency room if she develops any new heart racing, persistent palpitations, dizziness/lightheadedness, leg swelling, weight gain, chest discomfort, shortness of breath.  She was in agreement with this plan.  All questions answered.

## 2023-05-06 ENCOUNTER — Other Ambulatory Visit: Payer: Self-pay

## 2023-05-06 DIAGNOSIS — I272 Pulmonary hypertension, unspecified: Secondary | ICD-10-CM

## 2023-05-06 DIAGNOSIS — I2510 Atherosclerotic heart disease of native coronary artery without angina pectoris: Secondary | ICD-10-CM

## 2023-05-06 DIAGNOSIS — E875 Hyperkalemia: Secondary | ICD-10-CM

## 2023-05-06 DIAGNOSIS — I5022 Chronic systolic (congestive) heart failure: Secondary | ICD-10-CM

## 2023-05-18 ENCOUNTER — Telehealth: Payer: Self-pay | Admitting: Family Medicine

## 2023-05-18 NOTE — Telephone Encounter (Signed)
 Called pt and advised she needs an office visit before any forms are completed as she is overdue. Pt booked next available appt for April 2nd

## 2023-05-18 NOTE — Telephone Encounter (Signed)
 Copied from CRM [id number]. Topic: General - Other >> May 18, 2023  9:10 AM Carla Sloan wrote: Reason for CRM: Patient states that her handicap sign for her car has expired and is requesting a call from [facility] nurse to advise her on how to get it renewed.  ---  Tried to call pt about form, could not leave vm

## 2023-05-19 DIAGNOSIS — I2510 Atherosclerotic heart disease of native coronary artery without angina pectoris: Secondary | ICD-10-CM | POA: Diagnosis not present

## 2023-05-19 DIAGNOSIS — E875 Hyperkalemia: Secondary | ICD-10-CM | POA: Diagnosis not present

## 2023-05-19 DIAGNOSIS — I272 Pulmonary hypertension, unspecified: Secondary | ICD-10-CM | POA: Diagnosis not present

## 2023-05-19 DIAGNOSIS — I5022 Chronic systolic (congestive) heart failure: Secondary | ICD-10-CM | POA: Diagnosis not present

## 2023-05-19 LAB — BASIC METABOLIC PANEL WITH GFR
BUN/Creatinine Ratio: 17 (ref 12–28)
BUN: 17 mg/dL (ref 8–27)
CO2: 24 mmol/L (ref 20–29)
Calcium: 10.2 mg/dL (ref 8.7–10.3)
Chloride: 104 mmol/L (ref 96–106)
Creatinine, Ser: 1.01 mg/dL — ABNORMAL HIGH (ref 0.57–1.00)
Glucose: 104 mg/dL — ABNORMAL HIGH (ref 70–99)
Potassium: 4.6 mmol/L (ref 3.5–5.2)
Sodium: 141 mmol/L (ref 134–144)
eGFR: 63 mL/min/{1.73_m2}

## 2023-05-27 ENCOUNTER — Ambulatory Visit: Admitting: Family Medicine

## 2023-06-04 DIAGNOSIS — F333 Major depressive disorder, recurrent, severe with psychotic symptoms: Secondary | ICD-10-CM | POA: Diagnosis not present

## 2023-06-17 DIAGNOSIS — H0102A Squamous blepharitis right eye, upper and lower eyelids: Secondary | ICD-10-CM | POA: Diagnosis not present

## 2023-06-17 DIAGNOSIS — H401232 Low-tension glaucoma, bilateral, moderate stage: Secondary | ICD-10-CM | POA: Diagnosis not present

## 2023-06-17 DIAGNOSIS — H5010 Unspecified exotropia: Secondary | ICD-10-CM | POA: Diagnosis not present

## 2023-06-17 DIAGNOSIS — H0102B Squamous blepharitis left eye, upper and lower eyelids: Secondary | ICD-10-CM | POA: Diagnosis not present

## 2023-06-19 NOTE — Telephone Encounter (Signed)
 Lab req letter was left in the front office and will be discarded since patient already got labs done.

## 2023-06-25 ENCOUNTER — Other Ambulatory Visit: Payer: Self-pay | Admitting: Family Medicine

## 2023-07-03 ENCOUNTER — Other Ambulatory Visit: Payer: Self-pay | Admitting: Nurse Practitioner

## 2023-07-22 ENCOUNTER — Encounter: Payer: Medicare HMO | Attending: Family Medicine | Admitting: Skilled Nursing Facility1

## 2023-07-22 ENCOUNTER — Encounter: Payer: Self-pay | Admitting: Skilled Nursing Facility1

## 2023-07-22 DIAGNOSIS — Z713 Dietary counseling and surveillance: Secondary | ICD-10-CM | POA: Insufficient documentation

## 2023-07-22 DIAGNOSIS — Z6841 Body Mass Index (BMI) 40.0 and over, adult: Secondary | ICD-10-CM | POA: Diagnosis not present

## 2023-07-22 NOTE — Progress Notes (Signed)
 Follow-up visit:  Post-Operative sleeve Surgery   Anthropometrics  Surgery date: 04/24/2020 Surgery type: sleeve Start weight at [facility]: 411.3 pounds Weight today: 298  Labs: WNL   Body Composition Scale 05/06/2021 12/02/2021 04/16/2022 08/19/2022 07/22/2023  Current Body Weight 255.1 258.5 267.1 291.7 298  Total Body Fat % 46.1 48 47.8 49.6 50.1  Visceral Fat 18 19 20 23 24   Fat-Free Mass % 53.8 51.9 52.1 50.3 49.8   Total Body Water  % 41.4 40.4 40.5 39.6 39.4  Muscle-Mass lbs 31.4 29.3 30.7 30.9 30.9  BMI 41.1 42.7 44.1 48.2 49.3  Body Fat Displacement              Torso  lbs 73 77 79.2 89.9 92.7         Left Leg  lbs 14.6 15.4 15.8 17.9 18.5         Right Leg  lbs 14.6 15.4 15.8 17.9 18.5         Left Arm  lbs 7.3 7.7 7.9 8.9 9.2         Right Arm   lbs 7.3 7.7 7.9 8.9 9.2    Pt states she gets up and watches the grandkids and stays moving instead of just laying in the bed.  Pt states she is struggling to get out of the 280's.  Pt states she may go back for seconds of dinner.  Pt states she has a lot of friends she met at the gym.   24 hr recall: Breakfast: protein shake and grapefruit Snack 11:30:  Lunch 1pm: tuna and crackers Snack:  Dinner: cabbage + neck bones + mashed potatoes   Fluid intake: water  60-80 fluid ounces, grapefruit juice  Medications: See List Supplementation: bari multi: celebrate chewable and calcium      Using straws: no Drinking while eating: no Having you been chewing well: yes Chewing/swallowing difficulties: no Changes in vision: no Changes to mood/headaches: no Hair loss/Cahnges to skin/Changes to nails: no Any difficulty focusing or concentrating: no Sweating: no Dizziness/Lightheaded: no Palpitations: no  Carbonated beverages: no N/V/D/C/GAS: no Abdominal Pain: no Dumping syndrome: no  Recent physical activity: 3 days a week walk a mile, stomach crunches, arm lifting, water  aerobics, at home crunches, leg lifts,  dumbbells-everything 50 times   Progress Towards Goal(s):  In Progress Teaching method utilized: Patent attorney & Auditory  Demonstrated degree of understanding via: Teach Back  Readiness Level: Action Barriers to learning/adherence to lifestyle change: none identified  Goals: Keep yourself busy at the gym when you get your car back from your daughter to help you avoid eating seconds at dinner  Teaching Method Utilized:  Visual Auditory Hands on  Demonstrated degree of understanding via:  Teach Back   Monitoring/Evaluation:  Dietary intake, exercise, and body weight.

## 2023-07-23 ENCOUNTER — Ambulatory Visit
Admission: RE | Admit: 2023-07-23 | Discharge: 2023-07-23 | Disposition: A | Discharge status: Home / Self Care | Payer: Medicare HMO | Source: Ambulatory Visit | Attending: Family Medicine | Admitting: Family Medicine

## 2023-07-23 DIAGNOSIS — Z1231 Encounter for screening mammogram for malignant neoplasm of breast: Secondary | ICD-10-CM

## 2023-07-29 DIAGNOSIS — G894 Chronic pain syndrome: Secondary | ICD-10-CM | POA: Diagnosis not present

## 2023-07-29 DIAGNOSIS — G8929 Other chronic pain: Secondary | ICD-10-CM | POA: Diagnosis not present

## 2023-07-29 DIAGNOSIS — Z79899 Other long term (current) drug therapy: Secondary | ICD-10-CM | POA: Diagnosis not present

## 2023-08-14 ENCOUNTER — Ambulatory Visit (INDEPENDENT_AMBULATORY_CARE_PROVIDER_SITE_OTHER): Payer: Medicare HMO

## 2023-08-14 VITALS — BP 127/74 | Ht 65.0 in | Wt 298.0 lb

## 2023-08-14 DIAGNOSIS — Z0000 Encounter for general adult medical examination without abnormal findings: Secondary | ICD-10-CM | POA: Diagnosis not present

## 2023-08-14 NOTE — Progress Notes (Signed)
 Subjective:   Carla Sloan is a 63 y.o. who presents for a Medicare Wellness preventive visit.  As a reminder, Annual Wellness Visits don't include a physical exam, and some assessments may be limited, especially if this visit is performed virtually. We may recommend an in-person follow-up visit with your provider if needed.  Visit Complete: Virtual I connected with  Carla Sloan on 08/14/23 by a audio enabled telemedicine application and verified that I am speaking with the correct person using two identifiers.  Patient Location: Home  Provider Location: Office/Clinic  I discussed the limitations of evaluation and management by telemedicine. The patient expressed understanding and agreed to proceed.  Vital Signs: Because this visit was a virtual/telehealth visit, some criteria may be missing or patient reported. Any vitals not documented were not able to be obtained and vitals that have been documented are patient reported.  VideoDeclined- This patient declined Librarian, academic. Therefore the visit was completed with audio only.  Persons Participating in Visit: Patient.  AWV Questionnaire: No: Patient Medicare AWV questionnaire was not completed prior to this visit.  Cardiac Risk Factors include: advanced age (>91men, >74 women);obesity (BMI >30kg/m2)     Objective:    Today's Vitals   08/14/23 1520  Weight: 298 lb (135.2 kg)  Height: 5' 5 (1.651 m)   Body mass index is 49.59 kg/m.     08/14/2023    3:29 PM 08/05/2022    3:10 PM 07/04/2022    7:39 AM 04/11/2022    8:55 AM 03/19/2022    6:23 AM 03/12/2022    4:05 PM 07/20/2021    3:13 AM  Advanced Directives  Does Patient Have a Medical Advance Directive? No No No No No No No  Would patient like information on creating a medical advance directive? No - Patient declined No - Patient declined No - Patient declined No - Patient declined No - Patient declined No - Patient  declined     Current Medications (verified) Outpatient Encounter Medications as of 08/14/2023  Medication Sig   acetaminophen  (TYLENOL ) 500 MG tablet Take 1 tablet (500 mg total) by mouth every 6 (six) hours as needed.   bimatoprost (LUMIGAN) 0.03 % ophthalmic solution Place 1 drop into both eyes at bedtime.   Blood Pressure Monitoring (BLOOD PRESSURE CUFF) MISC 1 each by Does not apply route daily.   brimonidine -timolol  (COMBIGAN ) 0.2-0.5 % ophthalmic solution Place 1 drop into both eyes every 12 (twelve) hours.   calcium  citrate-vitamin D (CELEBRATE CALCIUM  CITRATE) 500-500 MG-UNIT chewable tablet Chew 1 tablet by mouth every evening.   citalopram  (CELEXA ) 40 MG tablet Take 40 mg by mouth daily.   cyclobenzaprine  (FLEXERIL ) 10 MG tablet TAKE 1 TABLET BY MOUTH THREE TIMES A DAY   ferrous sulfate  324 MG TBEC Take 324 mg by mouth daily with breakfast.   Lurasidone  HCl (LATUDA ) 60 MG TABS Take 60 mg by mouth at bedtime.   metoprolol  tartrate (LOPRESSOR ) 50 MG tablet Take 1 tablet (50 mg total) by mouth 2 (two) times daily. Can take an additional tablet as needed if heart rate over 100   Multiple Vitamins-Minerals (CELEBRATE MULTI-COMPLETE 45) CHEW Chew 1 each by mouth 3 (three) times daily.   ondansetron  (ZOFRAN ) 4 MG tablet Take 1 tablet (4 mg total) by mouth every 8 (eight) hours as needed for nausea or vomiting.   orphenadrine  (NORFLEX ) 100 MG tablet Take 1 tablet (100 mg total) by mouth 2 (two) times daily.   oxyCODONE -acetaminophen  (PERCOCET)  10-325 MG tablet Take 1 tablet by mouth twice a day as needed.   rosuvastatin  (CRESTOR ) 20 MG tablet TAKE 1 TABLET BY MOUTH EVERY DAY   sacubitril-valsartan (ENTRESTO ) 49-51 MG TAKE 1 TABLET BY MOUTH TWICE A DAY   azelastine (OPTIVAR) 0.05 % ophthalmic solution Apply 1 drop to eye 2 (two) times daily. (Patient not taking: Reported on 08/14/2023)   empagliflozin  (JARDIANCE ) 10 MG TABS tablet Take 1 tablet (10 mg total) by mouth daily before breakfast.  (Patient not taking: Reported on 08/14/2023)   empagliflozin  (JARDIANCE ) 10 MG TABS tablet Take 1 tablet (10 mg total) by mouth daily before breakfast.   metroNIDAZOLE  (FLAGYL ) 500 MG tablet Take 1 tablet (500 mg total) by mouth 2 (two) times daily. (Patient not taking: Reported on 08/14/2023)   RESTASIS  0.05 % ophthalmic emulsion Place 1 drop into both eyes 2 (two) times daily. (Patient not taking: Reported on 08/14/2023)   spironolactone  (ALDACTONE ) 25 MG tablet Take 0.5 tablets (12.5 mg total) by mouth daily. (Patient not taking: Reported on 08/14/2023)   No facility-administered encounter medications on file as of 08/14/2023.    Allergies (verified) Patient has no known allergies.   History: Past Medical History:  Diagnosis Date   Arthritis    Depression    Hypertension    Muscle spasms of lower extremity    and back   Tachycardia    Past Surgical History:  Procedure Laterality Date   ABDOMINOPLASTY/PANNICULECTOMY WITH LIPOSUCTION N/A 03/19/2022   Procedure: PANNICULECTOMY WITH LIPOSUCTION;  Surgeon: Thornell Flirt, DO;  Location: [facility];  Service: Plastics;  Laterality: N/A;   COLONOSCOPY WITH PROPOFOL  N/A 09/24/2012   Procedure: COLONOSCOPY WITH PROPOFOL ;  Surgeon: Mathew Solomon, MD;  Location: [facility];  Service: Endoscopy;  Laterality: N/A;   COLONOSCOPY WITH PROPOFOL  N/A 05/19/2016   Procedure: COLONOSCOPY WITH PROPOFOL ;  Surgeon: Ozell Blunt, MD;  Location: [facility]COPY;  Service: Endoscopy;  Laterality: N/A;   LAPAROSCOPIC GASTRIC SLEEVE RESECTION N/A 04/24/2020   Procedure: LAPAROSCOPIC GASTRIC SLEEVE RESECTION;  Surgeon: Adalberto Acton, MD;  Location: [facility];  Service: General;  Laterality: N/A;   NO PAST SURGERIES     RIGHT/LEFT HEART CATH AND CORONARY ANGIOGRAPHY N/A 07/04/2022   Procedure: RIGHT/LEFT HEART CATH AND CORONARY ANGIOGRAPHY;  Surgeon: Arleen Lacer, MD;  Location: [facility] CV LAB;  Service: Cardiovascular;  Laterality: N/A;    TUBAL LIGATION     UPPER GI ENDOSCOPY N/A 04/24/2020   Procedure: UPPER GI ENDOSCOPY;  Surgeon: Adalberto Acton, MD;  Location: [facility];  Service: General;  Laterality: N/A;   Family History  Problem Relation Age of Onset   Hypertension Mother    Diabetes Mother    Breast cancer Mother    Diabetes Father    Hypertension Father    Social History   Socioeconomic History   Marital status: Single    Spouse name: Not on file   Number of children: Not on file   Years of education: Not on file   Highest education level: Not on file  Occupational History   Occupation: Disablied  Tobacco Use   Smoking status: Never   Smokeless tobacco: Never  Vaping Use   Vaping status: Never Used  Substance and Sexual Activity   Alcohol use: No   Drug use: No   Sexual activity: Yes    Partners: Male    Birth control/protection: None, Post-menopausal  Other Topics Concern   Not on file  Social History Narrative  Lives alone/2025   Social Drivers of Health   Financial Resource Strain: Low Risk  (08/14/2023)   Overall Financial Resource Strain (CARDIA)    Difficulty of Paying Living Expenses: Not hard at all  Food Insecurity: No Food Insecurity (08/14/2023)   Hunger Vital Sign    Worried About Running Out of Food in the Last Year: Never true    Ran Out of Food in the Last Year: Never true  Transportation Needs: No Transportation Needs (08/14/2023)   PRAPARE - Administrator, Civil Service (Medical): No    Lack of Transportation (Non-Medical): No  Physical Activity: Sufficiently Active (08/14/2023)   Exercise Vital Sign    Days of Exercise per Week: 6 days    Minutes of Exercise per Session: 40 min  Stress: No Stress Concern Present (08/14/2023)   [employer] of Occupational Health - Occupational Stress Questionnaire    Feeling of Stress: Not at all  Social Connections: Unknown (08/14/2023)   Social Connection and Isolation Panel    Frequency of Communication with Friends  and Family: Twice a week    Frequency of Social Gatherings with Friends and Family: Three times a week    Attends Religious Services: Never    Active Member of Clubs or Organizations: No    Attends Banker Meetings: Never    Marital Status: Not on file    Tobacco Counseling Counseling given: Not Answered    Clinical Intake:     Pain : No/denies pain     BMI - recorded: 49.59 Nutritional Status: BMI > 30  Obese Nutritional Risks: None Diabetes: No  No results found for: HGBA1C   How often do you need to have someone help you when you read instructions, pamphlets, or other written materials from your doctor or pharmacy?: 1 - Never  Interpreter Needed?: No  Information entered by :: Mela Perham, RMA   Activities of Daily Living     08/14/2023    3:21 PM  In your present state of health, do you have any difficulty performing the following activities:  Hearing? 0  Vision? 0  Difficulty concentrating or making decisions? 0  Walking or climbing stairs? 0  Dressing or bathing? 0  Doing errands, shopping? 0  Preparing Food and eating ? N  Using the Toilet? N  In the past six months, have you accidently leaked urine? N  Do you have problems with loss of bowel control? N  Managing your Medications? N  Managing your Finances? N  Housekeeping or managing your Housekeeping? N    Patient Care Team: Abram Abraham, NP-C as PCP - General (Family Medicine) Devin Foerster, MD as Consulting Physician (Ophthalmology)  I have updated your Care Teams any recent Medical Services you may have received from other providers in the past year.     Assessment:   This is a routine wellness examination for Carla Sloan.  Hearing/Vision screen Hearing Screening - Comments:: Denies hearing difficulties   Vision Screening - Comments:: Denies vision issues./ Dr. Lonzell Robin     Goals Addressed             This Visit's Progress    My goal for 2024 is to start  back walking and doing water  aerobics.   On track      Depression Screen     08/14/2023    3:33 PM 10/02/2022    8:59 AM 08/05/2022    3:13 PM 07/10/2022    3:03 PM 01/09/2022  3:52 PM 10/10/2021    8:13 AM 06/19/2020    7:51 AM  PHQ 2/9 Scores  PHQ - 2 Score 0 0 0 0 0 0 0  PHQ- 9 Score 1 0 0 0  0     Fall Risk     08/14/2023    3:30 PM 08/05/2022    3:11 PM 01/09/2022    3:52 PM 11/23/2018   11:46 AM 03/01/2018    9:36 AM  Fall Risk   Falls in the past year? 0 0 0 0  0   Number falls in past yr: 0 0 0    Injury with Fall? 0 0 0    Risk for fall due to :  No Fall Risks No Fall Risks    Follow up Falls evaluation completed;Falls prevention discussed Falls prevention discussed Falls evaluation completed        Data saved with a previous flowsheet row definition    MEDICARE RISK AT HOME:  Medicare Risk at Home Any stairs in or around the home?: Yes (outsidein front) If so, are there any without handrails?: Yes Home free of loose throw rugs in walkways, pet beds, electrical cords, etc?: Yes Adequate lighting in your home to reduce risk of falls?: Yes Life alert?: No Use of a cane, walker or w/c?: Yes (cane) Grab bars in the bathroom?: No Shower chair or bench in shower?: No Elevated toilet seat or a handicapped toilet?: No  TIMED UP AND GO:  Was the test performed?  No  Cognitive Function: Declined/Normal: No cognitive concerns noted by patient or family. Patient alert, oriented, able to answer questions appropriately and recall recent events. No signs of memory loss or confusion.        08/05/2022    3:16 PM  6CIT Screen  What Year? 0 points  What month? 0 points  What time? 0 points  Count back from 20 0 points  Months in reverse 0 points  Repeat phrase 0 points  Total Score 0 points    Immunizations Immunization History  Administered Date(s) Administered   Influenza,inj,Quad PF,6+ Mos 11/07/2018, 12/07/2018   Influenza,inj,quad, With Preservative 11/07/2018    Influenza-Unspecified 10/21/2021   PFIZER(Purple Top)SARS-COV-2 Vaccination 05/26/2019, 06/20/2019, 02/07/2020, 08/23/2020   Unspecified SARS-COV-2 Vaccination 11/29/2021    Screening Tests Health Maintenance  Topic Date Due   DTaP/Tdap/Td (1 - Tdap) Never done   Pneumococcal Vaccine 65-14 Years old (1 of 2 - PCV) Never done   COVID-19 Vaccine (6 - 2024-25 season) 10/26/2022   INFLUENZA VACCINE  09/25/2023   Medicare Annual Wellness (AWV)  08/13/2024   MAMMOGRAM  07/22/2025   Cervical Cancer Screening (HPV/Pap Cotest)  09/20/2025   Colonoscopy  05/20/2026   Hepatitis C Screening  Completed   HIV Screening  Completed   HPV VACCINES  Aged Out   Meningococcal B Vaccine  Aged Out   Zoster Vaccines- Shingrix  Discontinued    Health Maintenance  Health Maintenance Due  Topic Date Due   DTaP/Tdap/Td (1 - Tdap) Never done   Pneumococcal Vaccine 77-77 Years old (1 of 2 - PCV) Never done   COVID-19 Vaccine (6 - 2024-25 season) 10/26/2022   Health Maintenance Items Addressed: See Nurse Notes at the end of this note  Additional Screening:  Vision Screening: Recommended annual ophthalmology exams for early detection of glaucoma and other disorders of the eye. Would you like a referral to an eye doctor? No    Dental Screening: Recommended annual dental exams for proper  oral hygiene  Community Resource Referral / Chronic Care Management: CRR required this visit?  No   CCM required this visit?  No   Plan:    I have personally reviewed and noted the following in the patient's chart:   Medical and social history Use of alcohol, tobacco or illicit drugs  Current medications and supplements including opioid prescriptions. Patient is not currently taking opioid prescriptions. Functional ability and status Nutritional status Physical activity Advanced directives List of other physicians Hospitalizations, surgeries, and ER visits in previous 12 months Vitals Screenings to  include cognitive, depression, and falls Referrals and appointments  In addition, I have reviewed and discussed with patient certain preventive protocols, quality metrics, and best practice recommendations. A written personalized care plan for preventive services as well as general preventive health recommendations were provided to patient.   Jaelon Gatley L Shaquill Iseman, CMA   08/14/2023   After Visit Summary: (MyChart) Due to this being a telephonic visit, the after visit summary with patients personalized plan was offered to patient via MyChart   Notes: Patient is due for a pneumonia vaccine and Tdap.  She is up to date on all other health maintenance with no concerns to address today.

## 2023-08-14 NOTE — Patient Instructions (Signed)
 Ms. Blake , Thank you for taking time out of your busy schedule to complete your Annual Wellness Visit with me. I enjoyed our conversation and look forward to speaking with you again next year. I, as well as your care team,  appreciate your ongoing commitment to your health goals. Please review the following plan we discussed and let me know if I can assist you in the future. Your Game plan/ To Do List    Follow up Visits: Next Medicare AWV with our clinical staff: 08/16/2024.   Have you seen your provider in the last 6 months (3 months if uncontrolled diabetes)? No Next Office Visit with your provider: 08/27/2023.  Clinician Recommendations:  Aim for 30 minutes of exercise or brisk walking, 6-8 glasses of water , and 5 servings of fruits and vegetables each day. You are due for a pneumonia vaccine and a tetanus vaccine.  Keep up the good work.      This is a list of the screening recommended for you and due dates:  Health Maintenance  Topic Date Due   DTaP/Tdap/Td vaccine (1 - Tdap) Never done   Pneumococcal Vaccination (1 of 2 - PCV) Never done   COVID-19 Vaccine (6 - 2024-25 season) 10/26/2022   Medicare Annual Wellness Visit  08/05/2023   Flu Shot  09/25/2023   Mammogram  07/22/2025   Pap with HPV screening  09/20/2025   Colon Cancer Screening  05/20/2026   Hepatitis C Screening  Completed   HIV Screening  Completed   HPV Vaccine  Aged Out   Meningitis B Vaccine  Aged Out   Zoster (Shingles) Vaccine  Discontinued    Advanced directives: (Declined) Advance directive discussed with you today. Even though you declined this today, please call our office should you change your mind, and we can give you the proper paperwork for you to fill out. Advance Care Planning is important because it:  [x]  Makes sure you receive the medical care that is consistent with your values, goals, and preferences  [x]  It provides guidance to your family and loved ones and reduces their decisional  burden about whether or not they are making the right decisions based on your wishes.  Follow the link provided in your after visit summary or read over the paperwork we have mailed to you to help you started getting your Advance Directives in place. If you need assistance in completing these, please reach out to us  so that we can help you!  See attachments for Preventive Care and Fall Prevention Tips.

## 2023-08-17 ENCOUNTER — Telehealth: Payer: Self-pay | Admitting: Family Medicine

## 2023-08-17 NOTE — Telephone Encounter (Signed)
 Copied from CRM [id number]. Topic: General - Other >> Aug 17, 2023  9:54 AM Revonda D wrote: Reason for CRM: Pt stated that she has an appt scheduled on 7/3 and would like to request a tetanus and Pneumococcal shot to be done on that appt also. Pt would like a callback with an update on the request.  ---   Can these orders be added for pt? TDW

## 2023-08-18 NOTE — Telephone Encounter (Signed)
 Called pt and notified based on insurance it would be best to get tetanus done at pharmacy and get the pneumococcal done at the same time as well just in case. Pt verbalized understanding

## 2023-08-27 ENCOUNTER — Other Ambulatory Visit

## 2023-08-27 ENCOUNTER — Other Ambulatory Visit: Payer: Self-pay | Admitting: Family Medicine

## 2023-08-27 ENCOUNTER — Encounter: Payer: Self-pay | Admitting: Family Medicine

## 2023-08-27 ENCOUNTER — Ambulatory Visit: Payer: Self-pay | Admitting: Family Medicine

## 2023-08-27 ENCOUNTER — Ambulatory Visit: Admitting: Family Medicine

## 2023-08-27 VITALS — BP 124/84 | HR 65 | Temp 97.8°F | Ht 65.0 in | Wt 293.0 lb

## 2023-08-27 DIAGNOSIS — Z9884 Bariatric surgery status: Secondary | ICD-10-CM | POA: Diagnosis not present

## 2023-08-27 DIAGNOSIS — R748 Abnormal levels of other serum enzymes: Secondary | ICD-10-CM | POA: Diagnosis not present

## 2023-08-27 DIAGNOSIS — Z6841 Body Mass Index (BMI) 40.0 and over, adult: Secondary | ICD-10-CM

## 2023-08-27 DIAGNOSIS — M170 Bilateral primary osteoarthritis of knee: Secondary | ICD-10-CM

## 2023-08-27 DIAGNOSIS — G894 Chronic pain syndrome: Secondary | ICD-10-CM | POA: Diagnosis not present

## 2023-08-27 LAB — HEPATIC FUNCTION PANEL
ALT: 18 U/L (ref 0–35)
AST: 27 U/L (ref 0–37)
Albumin: 4.3 g/dL (ref 3.5–5.2)
Alkaline Phosphatase: 58 U/L (ref 39–117)
Bilirubin, Direct: 0.1 mg/dL (ref 0.0–0.3)
Total Bilirubin: 0.5 mg/dL (ref 0.2–1.2)
Total Protein: 7.8 g/dL (ref 6.0–8.3)

## 2023-08-27 NOTE — Patient Instructions (Signed)
 Your liver enzymes have been elevated.  I would like to recheck these.  Please go to our [place] location on a [address] [employer].  The address is [address].  I will see back in 3 months for a complete physical exam.

## 2023-08-27 NOTE — Progress Notes (Signed)
 Subjective:     Patient ID: Carla Sloan, female    DOB: 1961/01/21, 63 y.o.   MRN: [id number]  Chief Complaint  Patient presents with   Medical Management of Chronic Issues    HPI History of Present Illness         Other providers:  OB/GYN Cardiologist- Jackee Alberts, NP [facility]  Dietician- Alexis Scotece next week  Pain - Dr. Bonner  Eyes- Dr. Octavia  Psychiatrist - Merrily  Surgeon for gastric bypass-   Here for a follow-up.  She sees several specialists.  Denies any concerns today.   Gastric bypass 3 years ago approx   Elevated LFTs at her cardiologist appt   Denies chronic NSAID use.  Does not drink alcohol.  [facility] [address]       Health Maintenance Due  Topic Date Due   Pneumococcal Vaccine 24-95 Years old (1 of 2 - PCV) Never done   COVID-19 Vaccine (6 - 2024-25 season) 10/26/2022    Past Medical History:  Diagnosis Date   Arthritis    Depression    Hypertension    Muscle spasms of lower extremity    and back   Tachycardia     Past Surgical History:  Procedure Laterality Date   ABDOMINOPLASTY/PANNICULECTOMY WITH LIPOSUCTION N/A 03/19/2022   Procedure: PANNICULECTOMY WITH LIPOSUCTION;  Surgeon: Lowery Estefana RAMAN, DO;  Location: [facility];  Service: Plastics;  Laterality: N/A;   COLONOSCOPY WITH PROPOFOL  N/A 09/24/2012   Procedure: COLONOSCOPY WITH PROPOFOL ;  Surgeon: Oliva FORBES Boots, MD;  Location: [facility];  Service: Endoscopy;  Laterality: N/A;   COLONOSCOPY WITH PROPOFOL  N/A 05/19/2016   Procedure: COLONOSCOPY WITH PROPOFOL ;  Surgeon: Oliva Boots, MD;  Location: [facility] ENDOSCOPY;  Service: Endoscopy;  Laterality: N/A;   LAPAROSCOPIC GASTRIC SLEEVE RESECTION N/A 04/24/2020   Procedure: LAPAROSCOPIC GASTRIC SLEEVE RESECTION;  Surgeon: Signe Mitzie LABOR, MD;  Location: [facility];  Service: General;  Laterality: N/A;   NO PAST SURGERIES     RIGHT/LEFT HEART CATH AND CORONARY ANGIOGRAPHY N/A 07/04/2022   Procedure:  RIGHT/LEFT HEART CATH AND CORONARY ANGIOGRAPHY;  Surgeon: Anner Alm ORN, MD;  Location: [facility] INVASIVE CV LAB;  Service: Cardiovascular;  Laterality: N/A;   TUBAL LIGATION     UPPER GI ENDOSCOPY N/A 04/24/2020   Procedure: UPPER GI ENDOSCOPY;  Surgeon: Signe Mitzie LABOR, MD;  Location: [facility];  Service: General;  Laterality: N/A;    Family History  Problem Relation Age of Onset   Hypertension Mother    Diabetes Mother    Breast cancer Mother    Diabetes Father    Hypertension Father     Social History   Socioeconomic History   Marital status: Single    Spouse name: Not on file   Number of children: Not on file   Years of education: Not on file   Highest education level: 12th grade  Occupational History   Occupation: Disablied  Tobacco Use   Smoking status: Never   Smokeless tobacco: Never  Vaping Use   Vaping status: Never Used  Substance and Sexual Activity   Alcohol use: No   Drug use: No   Sexual activity: Yes    Partners: Male    Birth control/protection: None, Post-menopausal  Other Topics Concern   Not on file  Social History Narrative   Lives alone/2025   Social Drivers of Health   Financial Resource Strain: Low Risk  (08/26/2023)   Overall Physicist, medical Strain (  CARDIA)    Difficulty of Paying Living Expenses: Not hard at all  Food Insecurity: No Food Insecurity (08/26/2023)   Hunger Vital Sign    Worried About Running Out of Food in the Last Year: Never true    Ran Out of Food in the Last Year: Never true  Transportation Needs: No Transportation Needs (08/26/2023)   PRAPARE - Administrator, Civil Service (Medical): No    Lack of Transportation (Non-Medical): No  Physical Activity: Inactive (08/26/2023)   Exercise Vital Sign    Days of Exercise per Week: 0 days    Minutes of Exercise per Session: Not on file  Stress: No Stress Concern Present (08/26/2023)   [employer] of Occupational Health - Occupational Stress Questionnaire    Feeling  of Stress: Not at all  Social Connections: Socially Isolated (08/26/2023)   Social Connection and Isolation Panel    Frequency of Communication with Friends and Family: Never    Frequency of Social Gatherings with Friends and Family: Twice a week    Attends Religious Services: 1 to 4 times per year    Active Member of [employer] or Organizations: No    Attends Engineer, structural: Not on file    Marital Status: Never married  Intimate Partner Violence: Patient Unable To Answer (08/14/2023)   Humiliation, Afraid, Rape, and Kick questionnaire    Fear of Current or Ex-Partner: Patient unable to answer    Emotionally Abused: Patient unable to answer    Physically Abused: Patient unable to answer    Sexually Abused: Patient unable to answer    Outpatient Medications Prior to Visit  Medication Sig Dispense Refill   acetaminophen  (TYLENOL ) 500 MG tablet Take 1 tablet (500 mg total) by mouth every 6 (six) hours as needed. 30 tablet 0   bimatoprost (LUMIGAN) 0.03 % ophthalmic solution Place 1 drop into both eyes at bedtime.     Blood Pressure Monitoring (BLOOD PRESSURE CUFF) MISC 1 each by Does not apply route daily. 1 each 0   brimonidine -timolol  (COMBIGAN ) 0.2-0.5 % ophthalmic solution Place 1 drop into both eyes every 12 (twelve) hours.     calcium  citrate-vitamin D (CELEBRATE CALCIUM  CITRATE) 500-500 MG-UNIT chewable tablet Chew 1 tablet by mouth every evening.     citalopram  (CELEXA ) 40 MG tablet Take 40 mg by mouth daily.     cyclobenzaprine  (FLEXERIL ) 10 MG tablet TAKE 1 TABLET BY MOUTH THREE TIMES A DAY 90 tablet 1   empagliflozin  (JARDIANCE ) 10 MG TABS tablet Take 1 tablet (10 mg total) by mouth daily before breakfast. 28 tablet 0   Lurasidone  HCl (LATUDA ) 60 MG TABS Take 60 mg by mouth at bedtime.     metoprolol  tartrate (LOPRESSOR ) 50 MG tablet Take 1 tablet (50 mg total) by mouth 2 (two) times daily. Can take an additional tablet as needed if heart rate over 100 270 tablet 2    Multiple Vitamins-Minerals (CELEBRATE MULTI-COMPLETE 45) CHEW Chew 1 each by mouth 3 (three) times daily.     ondansetron  (ZOFRAN ) 4 MG tablet Take 1 tablet (4 mg total) by mouth every 8 (eight) hours as needed for nausea or vomiting. 20 tablet 0   orphenadrine  (NORFLEX ) 100 MG tablet Take 1 tablet (100 mg total) by mouth 2 (two) times daily. 30 tablet 0   oxyCODONE -acetaminophen  (PERCOCET) 10-325 MG tablet Take 1 tablet by mouth twice a day as needed. 60 tablet 0   rosuvastatin  (CRESTOR ) 20 MG tablet TAKE 1 TABLET BY MOUTH  EVERY DAY 90 tablet 1   sacubitril-valsartan (ENTRESTO ) 49-51 MG TAKE 1 TABLET BY MOUTH TWICE A DAY 60 tablet 6   ferrous sulfate  324 MG TBEC Take 324 mg by mouth daily with breakfast.     azelastine (OPTIVAR) 0.05 % ophthalmic solution Apply 1 drop to eye 2 (two) times daily. (Patient not taking: Reported on 08/27/2023)     empagliflozin  (JARDIANCE ) 10 MG TABS tablet Take 1 tablet (10 mg total) by mouth daily before breakfast. (Patient not taking: Reported on 08/27/2023) 90 tablet 2   RESTASIS  0.05 % ophthalmic emulsion Place 1 drop into both eyes 2 (two) times daily. (Patient not taking: Reported on 08/27/2023)     spironolactone  (ALDACTONE ) 25 MG tablet Take 0.5 tablets (12.5 mg total) by mouth daily. (Patient not taking: Reported on 08/27/2023) 45 tablet 3   metroNIDAZOLE  (FLAGYL ) 500 MG tablet Take 1 tablet (500 mg total) by mouth 2 (two) times daily. (Patient not taking: Reported on 08/14/2023) 14 tablet 0   No facility-administered medications prior to visit.    No Known Allergies  Review of Systems  Constitutional:  Negative for chills, fever and malaise/fatigue.  HENT:  Negative for congestion, ear pain and sore throat.   Eyes:  Negative for blurred vision, double vision and photophobia.  Respiratory:  Negative for cough and shortness of breath.   Cardiovascular:  Negative for chest pain, palpitations, orthopnea and leg swelling.  Gastrointestinal:  Negative for abdominal  pain, constipation, diarrhea, nausea and vomiting.  Genitourinary:  Negative for dysuria, frequency and urgency.  Musculoskeletal:  Positive for joint pain. Negative for falls and myalgias.  Skin:  Negative for rash.  Neurological:  Negative for dizziness, tingling, focal weakness, loss of consciousness and headaches.  Psychiatric/Behavioral:  Negative for depression, substance abuse and suicidal ideas. The patient is not nervous/anxious.        Objective:    Physical Exam Constitutional:      General: She is not in acute distress.    Appearance: She is obese. She is not ill-appearing.  HENT:     Mouth/Throat:     Mouth: Mucous membranes are moist.     Pharynx: Oropharynx is clear.  Eyes:     Extraocular Movements: Extraocular movements intact.     Conjunctiva/sclera: Conjunctivae normal.  Cardiovascular:     Rate and Rhythm: Normal rate and regular rhythm.  Pulmonary:     Effort: Pulmonary effort is normal.     Breath sounds: Normal breath sounds.  Abdominal:     General: There is no distension.     Palpations: Abdomen is soft.     Tenderness: There is no abdominal tenderness. There is no guarding or rebound.  Musculoskeletal:     Cervical back: Normal range of motion and neck supple.     Right lower leg: No edema.     Left lower leg: No edema.  Skin:    General: Skin is warm and dry.  Neurological:     General: No focal deficit present.     Mental Status: She is alert and oriented to person, place, and time.     Cranial Nerves: No cranial nerve deficit.     Motor: No weakness.     Coordination: Coordination normal.     Gait: Gait normal.  Psychiatric:        Mood and Affect: Mood normal.        Behavior: Behavior normal.        Thought Content: Thought content normal.  BP 124/84 (BP Location: Left Arm, Patient Position: Sitting)   Pulse 65   Temp 97.8 F (36.6 C) (Temporal)   Ht 5' 5 (1.651 m)   Wt 293 lb (132.9 kg)   LMP 09/25/2015 (Approximate)    SpO2 99%   BMI 48.76 kg/m  Wt Readings from Last 3 Encounters:  08/27/23 293 lb (132.9 kg)  08/14/23 298 lb (135.2 kg)  07/22/23 298 lb (135.2 kg)       Assessment & Plan:   Problem List Items Addressed This Visit     Chronic pain syndrome   Osteoarthritis of knee   Other Visit Diagnoses       Elevated liver enzymes    -  Primary   Relevant Orders   Hepatic function panel     Severe obesity (BMI >= 40) (HCC)       Relevant Orders   Hepatic function panel     History of gastric bypass          She is here for follow-up.  Have not seen her in over a year. She is under the care of several specialists. Reviewed notes from cardiology and pain management. Reports being in her usual state of health today. Recently elevated liver enzymes at cardiology visit.  Recheck liver function today. Recommend follow-up for a fasting CPE.  I have discontinued Raegan D. Urquilla Debbie's ferrous sulfate  and metroNIDAZOLE . I am also having her maintain her citalopram , bimatoprost, Combigan , Latuda , Celebrate Multi-Complete 45, Celebrate Calcium  Citrate, acetaminophen , orphenadrine , oxyCODONE -acetaminophen , ondansetron , Restasis , Blood Pressure Cuff, azelastine, spironolactone , rosuvastatin , Entresto , empagliflozin , empagliflozin , cyclobenzaprine , and metoprolol  tartrate.  No orders of the defined types were placed in this encounter.

## 2023-09-01 ENCOUNTER — Encounter: Attending: Surgery | Admitting: Skilled Nursing Facility1

## 2023-09-01 ENCOUNTER — Encounter: Payer: Self-pay | Admitting: Skilled Nursing Facility1

## 2023-09-01 DIAGNOSIS — Z713 Dietary counseling and surveillance: Secondary | ICD-10-CM | POA: Insufficient documentation

## 2023-09-01 DIAGNOSIS — Z6841 Body Mass Index (BMI) 40.0 and over, adult: Secondary | ICD-10-CM | POA: Diagnosis not present

## 2023-09-01 NOTE — Progress Notes (Signed)
 Follow-up visit:  Post-Operative sleeve Surgery   Anthropometrics  Surgery date: 04/24/2020 Surgery type: sleeve Start weight at [facility]: 411.3 pounds Weight today: 290.2  Labs: WNL   Body Composition Scale 12/02/2021 04/16/2022 08/19/2022 07/22/2023 09/01/2023  Current Body Weight 258.5 267.1 291.7 298 290.2  Total Body Fat % 48 47.8 49.6 50.1 49.5  Visceral Fat 19 20 23 24 23   Fat-Free Mass % 51.9 52.1 50.3 49.8 50.4   Total Body Water  % 40.4 40.5 39.6 39.4 39.7  Muscle-Mass lbs 29.3 30.7 30.9 30.9 30.8  BMI 42.7 44.1 48.2 49.3 48  Body Fat Displacement              Torso  lbs 77 79.2 89.9 92.7 89.3         Left Leg  lbs 15.4 15.8 17.9 18.5 17.8         Right Leg  lbs 15.4 15.8 17.9 18.5 17.8         Left Arm  lbs 7.7 7.9 8.9 9.2 8.9         Right Arm   lbs 7.7 7.9 8.9 9.2 8.9    Pt states she gets up and watches the grandkids and stays moving instead of just laying in the bed.  Pt states she has gotten her car back but her daughter is going to use it again when school starts back up.  Pt states she loves the pool at the gym and doing her water  aerobics.   24 hr recall: Breakfast: grapefruit Snack 11:30:  Lunch 1pm: tuna and salad Snack: walnuts  Dinner: cabbage + mac n cheese + chicken   Fluid intake: water  60-80 fluid ounces, grapefruit juice  Medications: See List Supplementation: bari multi: celebrate chewable and calcium      Using straws: no Drinking while eating: no Having you been chewing well: yes Chewing/swallowing difficulties: no Changes in vision: no Changes to mood/headaches: no Hair loss/Cahnges to skin/Changes to nails: no Any difficulty focusing or concentrating: no Sweating: no Dizziness/Lightheaded: no Palpitations: no  Carbonated beverages: no N/V/D/C/GAS: no Abdominal Pain: no Dumping syndrome: no  Recent physical activity: 3 days a week walk a mile, stomach crunches, arm lifting, water  aerobics, at home crunches, leg lifts,  dumbbells-everything 50 times   Progress Towards Goal(s):  In Progress Teaching method utilized: Patent attorney & Auditory  Demonstrated degree of understanding via: Teach Back  Readiness Level: Action Barriers to learning/adherence to lifestyle change: none identified   Teaching Method Utilized:  Visual Auditory Hands on  Demonstrated degree of understanding via:  Teach Back   Monitoring/Evaluation:  Dietary intake, exercise, and body weight.

## 2023-09-03 DIAGNOSIS — F333 Major depressive disorder, recurrent, severe with psychotic symptoms: Secondary | ICD-10-CM | POA: Diagnosis not present

## 2023-09-28 ENCOUNTER — Telehealth: Payer: Self-pay | Admitting: Cardiology

## 2023-09-28 MED ORDER — ROSUVASTATIN CALCIUM 20 MG PO TABS: 20.0000 mg | ORAL_TABLET | Freq: Every day | ORAL | 2 refills | Status: AC

## 2023-09-28 NOTE — Telephone Encounter (Signed)
 *  STAT* If patient is at the pharmacy, call can be transferred to refill team.   1. Which medications need to be refilled? (please list name of each medication and dose if known) rosuvastatin  (CRESTOR ) 20 MG tablet    2. Would you like to learn more about the convenience, safety, & potential cost savings by using the [facility]? No      3. Are you open to using the [facility] (Type [facility]. ).no    4. Which pharmacy/location (including street and city if local pharmacy) is medication to be sent to? [id number] [place], [place] - [address]     5. Do they need a 30 day or 90 day supply? 90 days

## 2023-10-14 DIAGNOSIS — H0102A Squamous blepharitis right eye, upper and lower eyelids: Secondary | ICD-10-CM | POA: Diagnosis not present

## 2023-10-14 DIAGNOSIS — H0102B Squamous blepharitis left eye, upper and lower eyelids: Secondary | ICD-10-CM | POA: Diagnosis not present

## 2023-10-14 DIAGNOSIS — H401232 Low-tension glaucoma, bilateral, moderate stage: Secondary | ICD-10-CM | POA: Diagnosis not present

## 2023-10-14 DIAGNOSIS — H5010 Unspecified exotropia: Secondary | ICD-10-CM | POA: Diagnosis not present

## 2023-10-18 NOTE — Progress Notes (Signed)
 " Cardiology Office Note    Patient Name: Sharell Hilmer Date of Encounter: 10/18/2023  Primary Care Provider:  Lendia Boby CROME, NP-C Primary Cardiologist:  None Primary Electrophysiologist: None   Past Medical History    Past Medical History:  Diagnosis Date   Arthritis    Depression    Hypertension    Muscle spasms of lower extremity    and back   Tachycardia     History of Present Illness  Dimples Probus is a 63 y.o. 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   Ms. Shankle was last seen on 04/22/2023 for 57-month follow-up.  During her visit she reported doing well with no new cardiac complaints.  Her blood pressure was stable and she denied any episodes of tachycardia.  Ms. Heyer presents today for 61-month follow-up.  Reports doing well since her previous follow-up with no new cardiac complaints. Her medication regimen was adjusted after blood work in March showed a creatinine level of 1.29, leading to the discontinuation of Jardiance  and spironolactone . She continues to take metoprolol  and Entresto . Her blood pressure at home is typically around 127/72 mmHg. An echocardiogram from September showed improvement to a low normal range of 50-55%. No new cardiac symptoms such as palpitations or fast heartbeats, and no swelling in her ankles. She has a history of pulmonary hypertension, identified through a heart catheterization. No shortness of breath and maintains an active lifestyle, attending the gym three times a week without experiencing any breathing difficulties. She had lab work done in March, which included cholesterol and kidney function tests. Her primary doctor monitors her cholesterol levels. Patient denies chest pain, palpitations, dyspnea, PND, orthopnea, nausea, vomiting, dizziness, syncope,  edema, weight gain, or early satiety.  Discussed the use of AI scribe software for clinical note transcription with the patient, who gave verbal consent to proceed.  History of Present Illness   Review of Systems  Please see the history of present illness.    All other systems reviewed and are otherwise negative except as noted above.  Physical Exam    Wt Readings from Last 3 Encounters:  09/01/23 290 lb 3.2 oz (131.6 kg)  08/27/23 293 lb (132.9 kg)  08/14/23 298 lb (135.2 kg)   CD:Uyzmz were no vitals filed for this visit.,There is no height or weight on file to calculate BMI. GEN: Well nourished, well developed in no acute distress Neck: No JVD; No carotid bruits Pulmonary: Clear to auscultation without rales, wheezing or rhonchi  Cardiovascular: Normal rate. Regular rhythm. Normal S1. Normal S2.   Murmurs: There is no murmur.  ABDOMEN: Soft, non-tender, non-distended EXTREMITIES:  No edema; No deformity   EKG/LABS/ Recent Cardiac Studies   ECG personally reviewed by me today -sinus rhythm with rate of 62 bpm and no acute changes consistent with previous EKG.  Risk Assessment/Calculations:          Lab Results  Component Value Date   WBC 4.8 06/30/2022   HGB 11.6 (L) 07/04/2022   HGB 11.6 (L) 07/04/2022   HCT 34.0 (L) 07/04/2022   HCT 34.0 (L) 07/04/2022   MCV 89 06/30/2022   PLT 288 06/30/2022   Lab Results  Component Value Date   CREATININE 1.01 (H) 05/19/2023   BUN 17 05/19/2023   NA 141 05/19/2023   K 4.6 05/19/2023   CL 104 05/19/2023  CO2 24 05/19/2023   Lab Results  Component Value Date   CHOL 152 05/05/2023   HDL 57 05/05/2023   LDLCALC 84 05/05/2023   TRIG 49 05/05/2023   CHOLHDL 2.7 05/05/2023    No results found for: HGBA1C Assessment & Plan    Assessment & Plan   1.HFimEF: -2D echo was completed showing improved EF of 50-55% with global hypokinesis and grade 1 DD with low normal RV systolic function and normal PASP pressure. -  Patient is euvolemic on examination today. Jardiance  and spironolactone  discontinued due to kidney function concerns. -Continue current GDMT with Entresto  49/51 mg twice daily and metoprolol  50 mg twice daily - Order echocardiogram to assess heart function. - Consider medication adjustments based on echocardiogram results.  2.Nonobstructive CAD: -R/LHC performed that showed normal coronary arteries -Today patient reports no chest pain or angina since previous follow-up -Continue metoprolol  25 mg twice daily, Crestor  20 mg daily   3.  Essential hypertension : blood pressure well-controlled at home (127/72 mmHg) but slightly elevated in office (132/78 mmHg). No immediate medication adjustments planned unless home monitoring indicates otherwise. - Monitor blood pressure at home and maintain a log. - Reassess blood pressure management based on home monitoring results.   4.Pulmonary HTN: -Patient underwent R/LHC that showed elevated LVEDP with preserved cardiac index -Repeat 2D echo to evaluate PASP and LV function   5.History DVT: -She was previously on Eliquis for history of DVT.  She denies any recurrence of shortness of breath or lower extremity swelling.   6.  Hyperlipidemia: -Patient's last LDL cholesterol was improved at 84 -We will check LFTs and lipids when fasting and plan to increase Crestor  if indicated. -Continue Crestor  20 mg daily  7. Chronic kidney disease: -Kidney function previously affected, leading to discontinuation of Jardiance . - BMET today   Disposition: Follow-up with None or APP in 6 months    Signed, Wyn Raddle, Jackee Shove, NP 10/18/2023, 2:40 PM [facility] "

## 2023-10-19 ENCOUNTER — Encounter: Payer: Self-pay | Admitting: Nurse Practitioner

## 2023-10-19 ENCOUNTER — Ambulatory Visit: Attending: Nurse Practitioner | Admitting: Nurse Practitioner

## 2023-10-19 VITALS — BP 145/86 | HR 66 | Ht 65.0 in | Wt 301.4 lb

## 2023-10-19 DIAGNOSIS — R000 Tachycardia, unspecified: Secondary | ICD-10-CM

## 2023-10-19 DIAGNOSIS — I2510 Atherosclerotic heart disease of native coronary artery without angina pectoris: Secondary | ICD-10-CM | POA: Diagnosis not present

## 2023-10-19 DIAGNOSIS — E782 Mixed hyperlipidemia: Secondary | ICD-10-CM | POA: Diagnosis not present

## 2023-10-19 DIAGNOSIS — Z86718 Personal history of other venous thrombosis and embolism: Secondary | ICD-10-CM

## 2023-10-19 DIAGNOSIS — I272 Pulmonary hypertension, unspecified: Secondary | ICD-10-CM

## 2023-10-19 DIAGNOSIS — I5022 Chronic systolic (congestive) heart failure: Secondary | ICD-10-CM

## 2023-10-19 NOTE — Patient Instructions (Signed)
 Medication Instructions:  Your physician recommends that you continue on your current medications as directed. Please refer to the Current Medication list given to you today. *If you need a refill on your cardiac medications before your next appointment, please call your pharmacy*  Lab Work: TODAY-BMET If you have labs (blood work) drawn today and your tests are completely normal, you will receive your results only by: MyChart Message (if you have MyChart) OR A paper copy in the mail If you have any lab test that is abnormal or we need to change your treatment, we will call you to review the results.  Testing/Procedures: Your physician has requested that you have an echocardiogram. Echocardiography is a painless test that uses sound waves to create images of your heart. It provides your doctor with information about the size and shape of your heart and how well your heart's chambers and valves are working. This procedure takes approximately one hour. There are no restrictions for this procedure. Please do NOT wear cologne, perfume, aftershave, or lotions (deodorant is allowed). Please arrive 15 minutes prior to your appointment time.  Please note: We ask at that you not bring children with you during ultrasound (echo/ vascular) testing. Due to room size and safety concerns, children are not allowed in the ultrasound rooms during exams. Our front office staff cannot provide observation of children in our lobby area while testing is being conducted. An adult accompanying a patient to their appointment will only be allowed in the ultrasound room at the discretion of the ultrasound technician under special circumstances. We apologize for any inconvenience.  Follow-Up: At [facility], you and your health needs are our priority.  As part of our continuing mission to provide you with exceptional heart care, our providers are all part of one team.  This team includes your primary Cardiologist  (physician) and Advanced Practice Providers or APPs (Physician Assistants and Nurse Practitioners) who all work together to provide you with the care you need, when you need it.  Your next appointment:   6 month(s)  Provider:   Oneil Parchment, MD  We recommend signing up for the patient portal called MyChart.  Sign up information is provided on this After Visit Summary.  MyChart is used to connect with patients for Virtual Visits (Telemedicine).  Patients are able to view lab/test results, encounter notes, upcoming appointments, etc.  Non-urgent messages can be sent to your provider as well.   To learn more about what you can do with MyChart, go to [email or web address].   Other Instructions CONTINUE TO MONITOR YOUR BLOOD PRESSURE

## 2023-10-20 ENCOUNTER — Ambulatory Visit: Payer: Self-pay | Admitting: Nurse Practitioner

## 2023-10-20 DIAGNOSIS — I5022 Chronic systolic (congestive) heart failure: Secondary | ICD-10-CM

## 2023-10-20 LAB — BASIC METABOLIC PANEL WITH GFR
BUN/Creatinine Ratio: 11 — ABNORMAL LOW (ref 12–28)
BUN: 10 mg/dL (ref 8–27)
CO2: 23 mmol/L (ref 20–29)
Calcium: 10 mg/dL (ref 8.7–10.3)
Chloride: 107 mmol/L — ABNORMAL HIGH (ref 96–106)
Creatinine, Ser: 0.89 mg/dL (ref 0.57–1.00)
Glucose: 80 mg/dL (ref 70–99)
Potassium: 4.3 mmol/L (ref 3.5–5.2)
Sodium: 145 mmol/L — ABNORMAL HIGH (ref 134–144)
eGFR: 73 mL/min/1.73 (ref >59)

## 2023-10-22 ENCOUNTER — Other Ambulatory Visit: Payer: Self-pay

## 2023-10-22 DIAGNOSIS — I5022 Chronic systolic (congestive) heart failure: Secondary | ICD-10-CM

## 2023-11-04 ENCOUNTER — Telehealth: Payer: Self-pay | Admitting: Nurse Practitioner

## 2023-11-04 MED ORDER — SACUBITRIL-VALSARTAN 49-51 MG PO TABS: 1.0000 | ORAL_TABLET | Freq: Two times a day (BID) | ORAL | 3 refills | Status: AC

## 2023-11-04 NOTE — Telephone Encounter (Signed)
 RX sent in

## 2023-11-04 NOTE — Telephone Encounter (Signed)
*  STAT* If patient is at the pharmacy, call can be transferred to refill team.   1. Which medications need to be refilled? (please list name of each medication and dose if known)   sacubitril -valsartan  (ENTRESTO ) 49-51 MG    2. Which pharmacy/location (including street and city if local pharmacy) is medication to be sent to? [id number] [place] [place], [place] - [address] Phone: [phone]  Fax: [phone]     3. Do they need a 30 day or 90 day supply? 90

## 2023-11-05 DIAGNOSIS — I5022 Chronic systolic (congestive) heart failure: Secondary | ICD-10-CM | POA: Diagnosis not present

## 2023-11-06 ENCOUNTER — Ambulatory Visit: Payer: Self-pay | Admitting: Physician Assistant

## 2023-11-06 LAB — BASIC METABOLIC PANEL WITH GFR
BUN/Creatinine Ratio: 21 (ref 12–28)
BUN: 14 mg/dL (ref 8–27)
CO2: 18 mmol/L — ABNORMAL LOW (ref 20–29)
Calcium: 10.3 mg/dL (ref 8.7–10.3)
Chloride: 105 mmol/L (ref 96–106)
Creatinine, Ser: 0.68 mg/dL (ref 0.57–1.00)
Glucose: 61 mg/dL — ABNORMAL LOW (ref 70–99)
Potassium: 4.3 mmol/L (ref 3.5–5.2)
Sodium: 143 mmol/L (ref 134–144)
eGFR: 98 mL/min/1.73 (ref >59)

## 2023-11-17 ENCOUNTER — Telehealth: Payer: Self-pay

## 2023-11-17 MED ORDER — COVID-19 MRNA VACC (MODERNA) 50 MCG/0.5ML IM SUSP: 0.5000 mL | Freq: Once | INTRAMUSCULAR | 0 refills | Status: AC

## 2023-11-17 NOTE — Telephone Encounter (Signed)
 Copied from CRM [id number]. Topic: Clinical - Medication Question >> Nov 17, 2023 10:15 AM Debby BROCKS wrote: Reason for CRM: Patient would like to receive the covid prescription to be able to take the vaccine at her pharmacy

## 2023-11-17 NOTE — Telephone Encounter (Signed)
 Rx sent as pt is not 65+

## 2023-11-18 ENCOUNTER — Encounter (HOSPITAL_COMMUNITY): Payer: Self-pay | Admitting: *Deleted

## 2023-11-19 ENCOUNTER — Ambulatory Visit (HOSPITAL_COMMUNITY)
Admission: RE | Admit: 2023-11-19 | Discharge: 2023-11-19 | Disposition: A | Discharge status: Home / Self Care | Source: Ambulatory Visit | Attending: Cardiology | Admitting: Cardiology

## 2023-11-19 DIAGNOSIS — I272 Pulmonary hypertension, unspecified: Secondary | ICD-10-CM | POA: Diagnosis not present

## 2023-11-19 DIAGNOSIS — E782 Mixed hyperlipidemia: Secondary | ICD-10-CM | POA: Diagnosis not present

## 2023-11-19 DIAGNOSIS — I2510 Atherosclerotic heart disease of native coronary artery without angina pectoris: Secondary | ICD-10-CM | POA: Insufficient documentation

## 2023-11-19 DIAGNOSIS — R000 Tachycardia, unspecified: Secondary | ICD-10-CM | POA: Insufficient documentation

## 2023-11-19 DIAGNOSIS — I5022 Chronic systolic (congestive) heart failure: Secondary | ICD-10-CM | POA: Diagnosis not present

## 2023-11-19 DIAGNOSIS — Z86718 Personal history of other venous thrombosis and embolism: Secondary | ICD-10-CM | POA: Insufficient documentation

## 2023-11-19 MED ORDER — PERFLUTREN LIPID MICROSPHERE
1.0000 mL | INTRAVENOUS | Status: AC | PRN
  Administered 2023-11-19: 2 mL via INTRAVENOUS

## 2023-11-23 ENCOUNTER — Other Ambulatory Visit: Payer: Self-pay | Admitting: Family Medicine

## 2023-11-23 LAB — ECHOCARDIOGRAM COMPLETE
AR max vel: 2.5 cm2
AV Area VTI: 2.57 cm2
AV Area mean vel: 2.78 cm2
AV Mean grad: 5 mmHg
AV Peak grad: 9 mmHg
Ao pk vel: 1.5 m/s
Area-P 1/2: 3.3 cm2
Est EF: 50
S' Lateral: 4 cm

## 2023-11-23 MED ORDER — CYCLOBENZAPRINE HCL 10 MG PO TABS: 10.0000 mg | ORAL_TABLET | Freq: Three times a day (TID) | ORAL | 1 refills | Status: AC

## 2023-11-23 NOTE — Telephone Encounter (Signed)
 Copied from CRM #[id number]. Topic: Clinical - Medication Refill >> Nov 23, 2023  8:11 AM Emylou G wrote: Medication: cyclobenzaprine  (FLEXERIL ) 10 MG tablet  Has the patient contacted their pharmacy? No (Agent: If no, request that the patient contact the pharmacy for the refill. If patient does not wish to contact the pharmacy document the reason why and proceed with request.) (Agent: If yes, when and what did the pharmacy advise?)  This is the patient's preferred pharmacy:  [id number] [place], [place] - [address] [facility] [address] [place] [address] Phone: [phone] Fax: [phone]  Is this the correct pharmacy for this prescription? Yes If no, delete pharmacy and type the correct one.   Has the prescription been filled recently? No  Is the patient out of the medication? Yes  Has the patient been seen for an appointment in the last year OR does the patient have an upcoming appointment? Yes  Can we respond through MyChart? No  Agent: Please be advised that Rx refills may take up to 3 business days. We ask that you follow-up with your pharmacy.

## 2023-11-23 NOTE — Telephone Encounter (Signed)
 Called pt and she states she is not taking the norflex , hasn't taken it in a while. Ortho is not handling this and states she only does the flexeril  which you prescribe. D/c the norflex  from her chart

## 2023-11-24 ENCOUNTER — Ambulatory Visit (HOSPITAL_COMMUNITY): Admission: EM | Admit: 2023-11-24 | Discharge: 2023-11-24 | Disposition: A | Discharge status: Home / Self Care

## 2023-11-24 ENCOUNTER — Encounter (HOSPITAL_COMMUNITY): Payer: Self-pay

## 2023-11-24 DIAGNOSIS — M5442 Lumbago with sciatica, left side: Secondary | ICD-10-CM | POA: Diagnosis not present

## 2023-11-24 DIAGNOSIS — G894 Chronic pain syndrome: Secondary | ICD-10-CM

## 2023-11-24 DIAGNOSIS — M5441 Lumbago with sciatica, right side: Secondary | ICD-10-CM

## 2023-11-24 NOTE — ED Provider Notes (Signed)
 [facility]    CSN: [id number] Arrival date & time: 11/24/23  1433      History   Chief Complaint Chief Complaint  Patient presents with   Back Pain    HPI Carla Sloan is a 63 y.o. female.   63 year old female, Carla Sloan, presents to urgent care for evaluation of lower back pain that radiates to the buttocks and thighs x 3 days.  Patient denies any trauma fall injury or heavy lifting.  Patient states she has been taking Tylenol  for pain.  Patient denies any loss of bowel or bladder, no loss of function, no saddle numbness. Maew x 4. GCS 15  PMH: chronic pain syndrome, back pain  The history is provided by the patient. No language interpreter was used.    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   Pain in right foot 05/11/2020   Morbid obesity (HCC) 04/24/2020   Osteoarthritis of knee 12/10/2017   Osteoarthritis of left knee 12/10/2017   Pain in right knee 09/09/2017   Pain in left knee 09/09/2017    Past Surgical History:  Procedure Laterality Date   ABDOMINOPLASTY/PANNICULECTOMY WITH LIPOSUCTION N/A 03/19/2022   Procedure: PANNICULECTOMY WITH LIPOSUCTION;  Surgeon: Lowery Estefana RAMAN, DO;  Location: [facility];  Service: Plastics;  Laterality: N/A;   COLONOSCOPY WITH PROPOFOL  N/A 09/24/2012   Procedure: COLONOSCOPY WITH PROPOFOL ;  Surgeon: Oliva FORBES Boots, MD;  Location: [facility];  Service: Endoscopy;  Laterality: N/A;   COLONOSCOPY WITH  PROPOFOL  N/A 05/19/2016   Procedure: COLONOSCOPY WITH PROPOFOL ;  Surgeon: Oliva Boots, MD;  Location: [facility];  Service: Endoscopy;  Laterality: N/A;   LAPAROSCOPIC GASTRIC SLEEVE RESECTION N/A 04/24/2020   Procedure: LAPAROSCOPIC GASTRIC SLEEVE RESECTION;  Surgeon: Signe Mitzie LABOR, MD;  Location: [facility];  Service: General;  Laterality: N/A;   NO PAST SURGERIES     RIGHT/LEFT HEART CATH AND CORONARY ANGIOGRAPHY N/A 07/04/2022   Procedure: RIGHT/LEFT HEART CATH AND CORONARY ANGIOGRAPHY;  Surgeon: Anner Alm ORN, MD;  Location: [facility];  Service: Cardiovascular;  Laterality: N/A;   TUBAL LIGATION     UPPER GI ENDOSCOPY N/A 04/24/2020   Procedure: UPPER GI ENDOSCOPY;  Surgeon: Signe Mitzie LABOR, MD;  Location: [facility];  Service: General;  Laterality: N/A;    OB History     Gravida  2   Para  2   Term  2   Preterm      AB      Living  2      SAB      IAB      Ectopic      Multiple      Live Births  2            Home Medications    Prior to Admission medications   Medication Sig Start Date End Date Taking? Authorizing Provider  acetaminophen  (TYLENOL ) 500 MG tablet Take 1 tablet (500  mg total) by mouth every 6 (six) hours as needed. 05/11/20   Tonette Lauraine HERO, PA-C  bimatoprost (LUMIGAN) 0.03 % ophthalmic solution Place 1 drop into both eyes at bedtime.    [provider]  Blood Pressure Monitoring (BLOOD PRESSURE CUFF) MISC 1 each by Does not apply route daily. 07/08/22   Wyn Jackee VEAR Mickey., NP  brimonidine -timolol  (COMBIGAN ) 0.2-0.5 % ophthalmic solution Place 1 drop into both eyes every 12 (twelve) hours.    [provider]  calcium  citrate-vitamin D (CELEBRATE CALCIUM  CITRATE) 500-500 MG-UNIT chewable tablet Chew 1 tablet by mouth every evening.    [provider]  citalopram  (CELEXA ) 40 MG tablet Take 40 mg by mouth daily.    [provider]  cyclobenzaprine  (FLEXERIL ) 10 MG tablet Take 1 tablet (10 mg total) by  mouth 3 (three) times daily. 11/23/23   Henson, Vickie L, NP-C  Lurasidone  HCl (LATUDA ) 60 MG TABS Take 60 mg by mouth at bedtime.    [provider]  metoprolol  tartrate (LOPRESSOR ) 50 MG tablet Take 1 tablet (50 mg total) by mouth 2 (two) times daily. Can take an additional tablet as needed if heart rate over 100 07/03/23   Wyn Jackee VEAR Mickey., NP  Multiple Vitamins-Minerals (CELEBRATE MULTI-COMPLETE 45) CHEW Chew 1 each by mouth 3 (three) times daily.    [provider]  ondansetron  (ZOFRAN ) 4 MG tablet Take 1 tablet (4 mg total) by mouth every 8 (eight) hours as needed for nausea or vomiting. 03/07/22   Scheeler, Donnice PARAS, PA-C  oxyCODONE -acetaminophen  (PERCOCET) 10-325 MG tablet Take 1 tablet by mouth twice a day as needed. 08/05/21     rosuvastatin  (CRESTOR ) 20 MG tablet Take 1 tablet (20 mg total) by mouth daily. 09/28/23   Wyn Jackee VEAR Mickey., NP  sacubitril -valsartan  (ENTRESTO ) 49-51 MG Take 1 tablet by mouth 2 (two) times daily. 11/04/23   Wyn Jackee VEAR Mickey., NP    Family History Family History  Problem Relation Age of Onset   Hypertension Mother    Diabetes Mother    Breast cancer Mother    Diabetes Father    Hypertension Father     Social History Social History   Tobacco Use   Smoking status: Never   Smokeless tobacco: Never  Vaping Use   Vaping status: Never Used  Substance Use Topics   Alcohol use: No   Drug use: No     Allergies   Patient has no known allergies.   Review of Systems Review of Systems  Constitutional:  Negative for fever.  Genitourinary:  Negative for dysuria.  Musculoskeletal:  Positive for back pain and myalgias.  Skin:  Negative for rash.  Neurological:  Negative for weakness.  All other systems reviewed and are negative.    Physical Exam Triage Vital Signs ED Triage Vitals  Encounter Vitals Group     BP      Girls Systolic BP Percentile      Girls Diastolic BP Percentile      Boys Systolic BP Percentile      Boys  Diastolic BP Percentile      Pulse      Resp      Temp      Temp src      SpO2      Weight      Height      Head Circumference      Peak Flow      Pain Score  Pain Loc      Pain Education      Exclude from Growth Chart    No data found.  Updated Vital Signs BP (!) 145/76 (BP Location: Left Arm)   Pulse (!) 55   Temp 97.7 F (36.5 C) (Oral)   Resp 16   LMP 09/25/2015 (Approximate)   SpO2 99%   Visual Acuity Right Eye Distance:   Left Eye Distance:   Bilateral Distance:    Right Eye Near:   Left Eye Near:    Bilateral Near:     Physical Exam Vitals and nursing note reviewed.  Cardiovascular:     Rate and Rhythm: Normal rate.     Pulses:          Dorsalis pedis pulses are 2+ on the right side and 2+ on the left side.  Musculoskeletal:     Lumbar back: Spasms and tenderness present. Negative right straight leg raise test and negative left straight leg raise test.  Neurological:     General: No focal deficit present.     Mental Status: She is alert and oriented to person, place, and time.     GCS: GCS eye subscore is 4. GCS verbal subscore is 5. GCS motor subscore is 6.     Cranial Nerves: No cranial nerve deficit.     Sensory: Sensation is intact.     Motor: Motor function is intact.     Gait: Gait is intact.  Psychiatric:        Attention and Perception: Attention normal.        Mood and Affect: Mood normal.        Speech: Speech normal.        Behavior: Behavior normal. Behavior is cooperative.      UC Treatments / Results  Labs (all labs ordered are listed, but only abnormal results are displayed) Labs Reviewed - No data to display  EKG   Radiology No results found.  Procedures Procedures (including critical care time)  Medications Ordered in UC Medications - No data to display  Initial Impression / Assessment and Plan / UC Course  I have reviewed the triage vital signs and the nursing notes.  Pertinent labs & imaging results that  were available during my care of the patient were reviewed by me and considered in my medical decision making (see chart for details).  Clinical Course as of 11/24/23 1635  Tue Nov 24, 2023  1550 Pt received #120 Percocet 10 on 10/28/2023 Pt received #90 flexeril  10 mg yesterday [JD]    Clinical Course User Index [JD] [place], Rilla, NP  Discussed exam findings and plan of care with patient, pt has prescription for flexeril  and percocet, recommend lidocaine  or biofreeze as label directed, strict go to ER precautions given.   Patient verbalized understanding to this provider.  Ddx: Chronic pain syndrome, muscle spasm, sciatica Final Clinical Impressions(s) / UC Diagnoses   Final diagnoses:  Chronic pain syndrome  Bilateral low back pain with bilateral sciatica, unspecified chronicity     Discharge Instructions      Take your flexeril  and percocet as prescribed Take home meds as directed. May use heat or ice to back for comfort. May use lidocaine  patch or biofreeze for pain. Please follow up with PCP/pain spercialist. GO immediately to ER for loss of bowel and bladder,loss of function, saddle numbness, etc.      ED Prescriptions   None    PDMP not reviewed this encounter.   Doss Cybulski,  Rilla, NP 11/24/23 1635

## 2023-11-24 NOTE — ED Triage Notes (Signed)
 Patient c/o bilateral lower back pain that radiates into the buttocks and upper thighs x 3 days. Patient denies any injury or heavy lifting.  Patient states she hs been taking Tylenol  for pain.

## 2023-11-24 NOTE — Discharge Instructions (Addendum)
 Take your flexeril  and percocet as prescribed Take home meds as directed. May use heat or ice to back for comfort. May use lidocaine  patch or biofreeze for pain. Please follow up with PCP/pain spercialist. GO immediately to ER for loss of bowel and bladder,loss of function, saddle numbness, etc.

## 2023-11-27 ENCOUNTER — Encounter: Payer: Self-pay | Admitting: Family Medicine

## 2023-11-27 ENCOUNTER — Ambulatory Visit: Admitting: Family Medicine

## 2023-11-27 VITALS — BP 136/80 | HR 71 | Temp 97.5°F | Ht 65.0 in | Wt 289.6 lb

## 2023-11-27 DIAGNOSIS — Z0001 Encounter for general adult medical examination with abnormal findings: Secondary | ICD-10-CM

## 2023-11-27 IMAGING — CT CT HEAD W/O CM
3 series · 16 of 47 positions shown, 19 images · non-contrast
Comparison: None.

CLINICAL DATA: Head trauma, abnormal mental status.



[Series 4: head 5.0 h30s · axial · 0.44mm/px · z∈[-80,+50]mm · 10 of 32 slices shown, 13 images]
[im 3/32  brain]
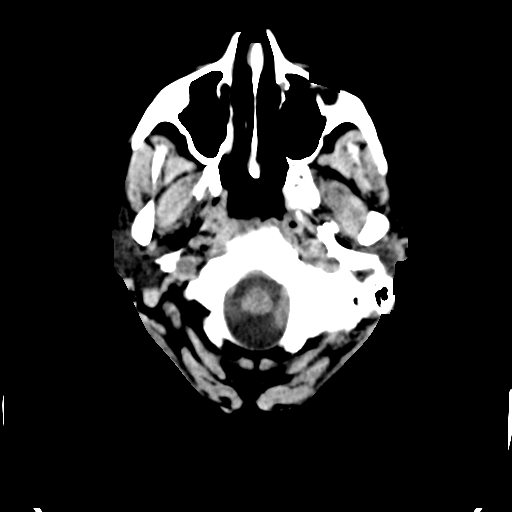
[im 3/32  bone]
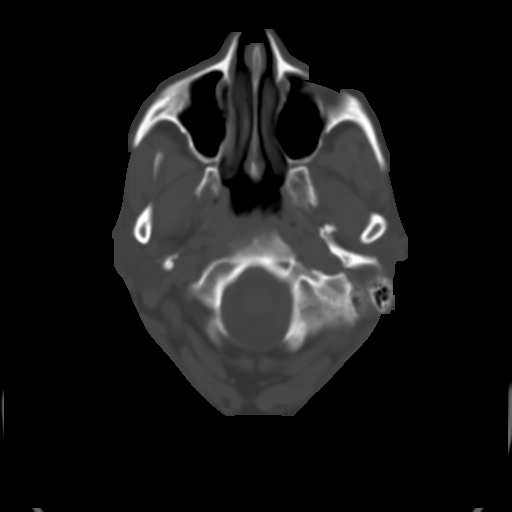
[im 6/32  brain]
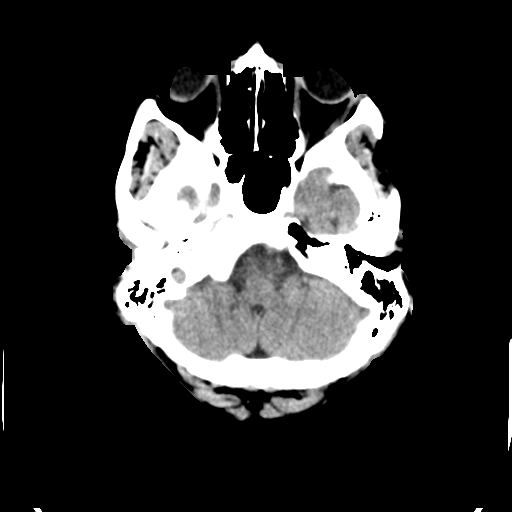
[im 9/32  brain]
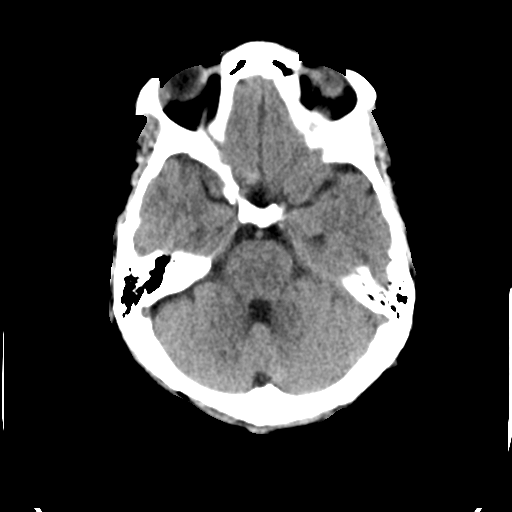
[im 11/32  brain]
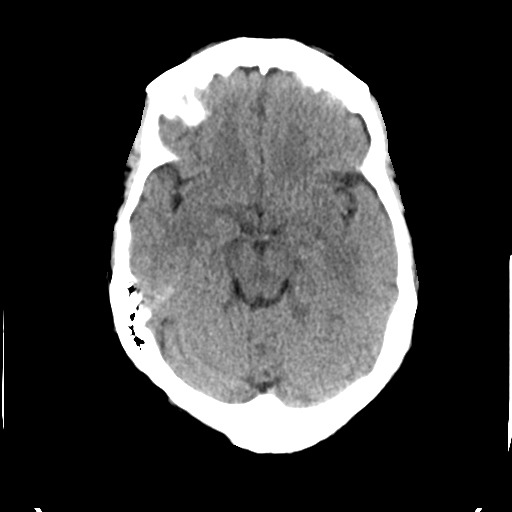
[im 14/32  brain]
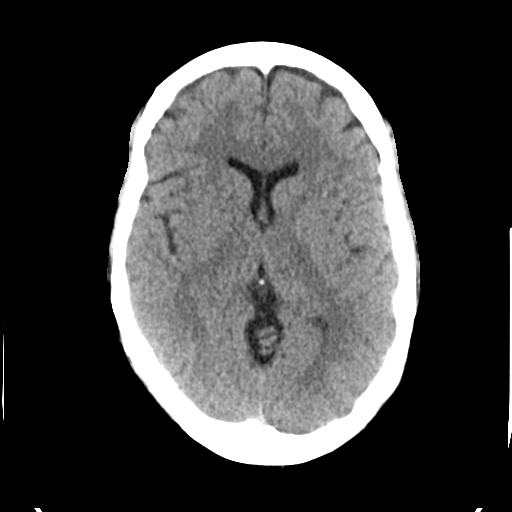
[im 14/32  bone]
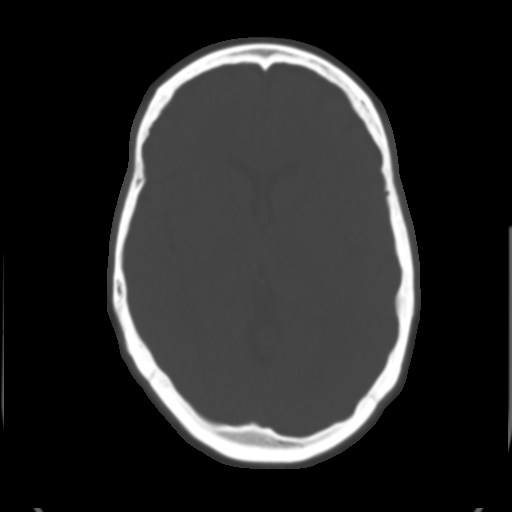
[im 18/32  brain]
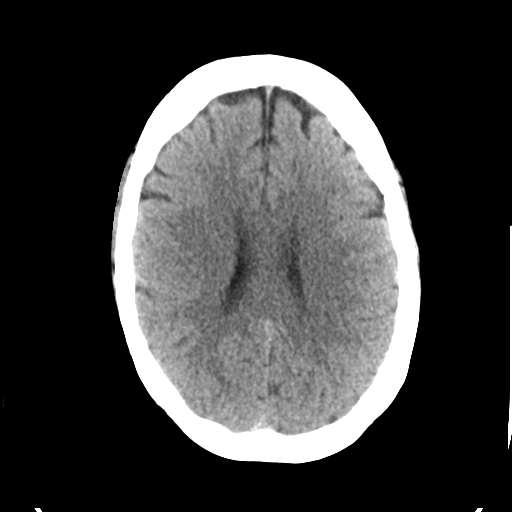
[im 21/32  brain]
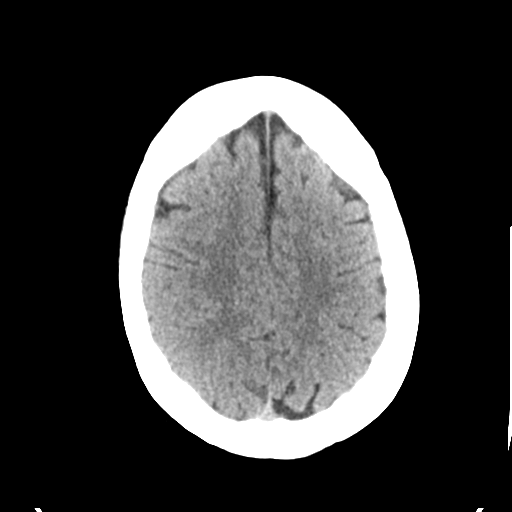
[im 24/32  brain]
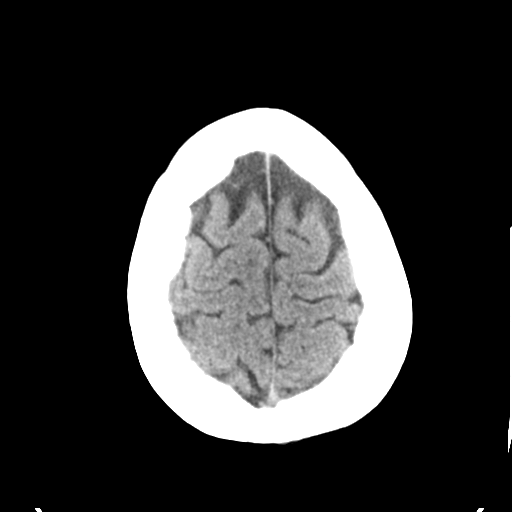
[im 26/32  brain]
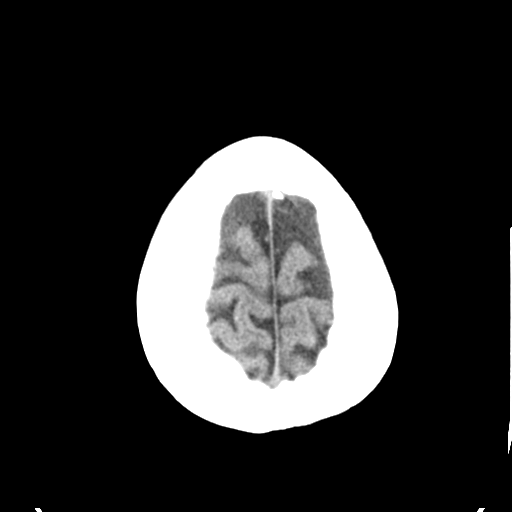
[im 26/32  bone]
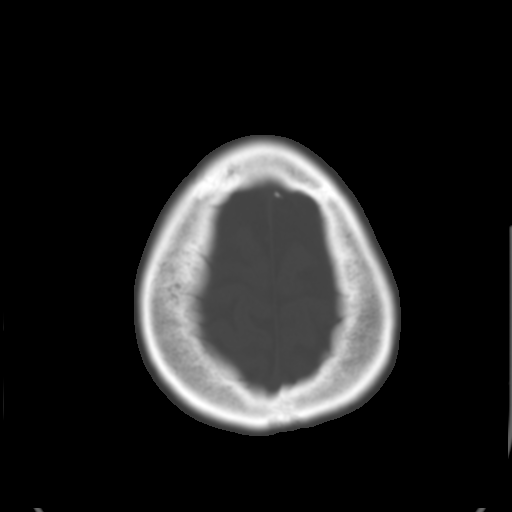
[im 29/32  brain]
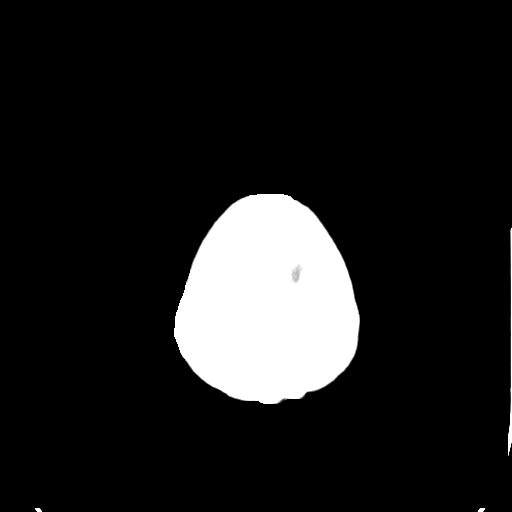

[Series 5: head 3.0 mpr cor · coronal · 0.31mm/px · 3 of 68 slices shown]
[im 23/68  brain]
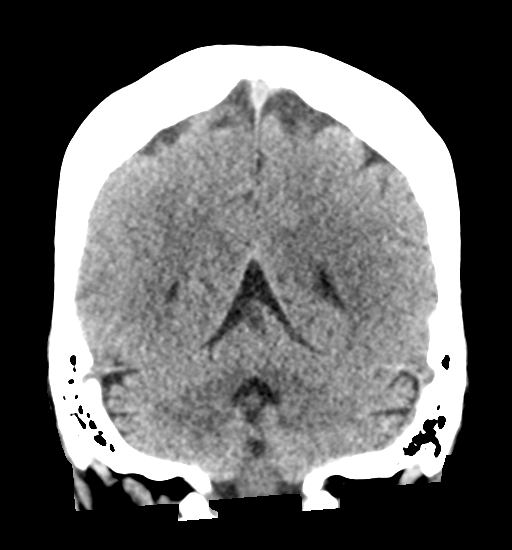
[im 30/68  brain]
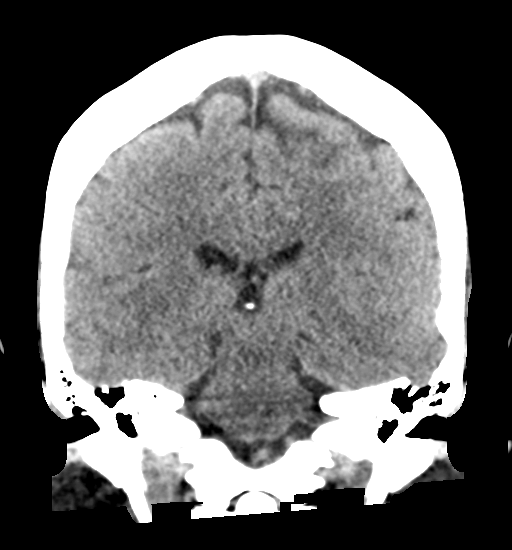
[im 38/68  brain]
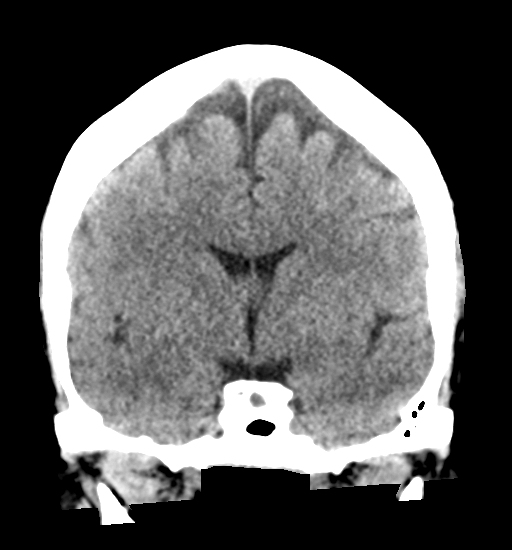

[Series 6: head 3.0 mpr sag · sagittal · 0.33mm/px · 3 of 54 slices shown]
[im 18/54  brain]
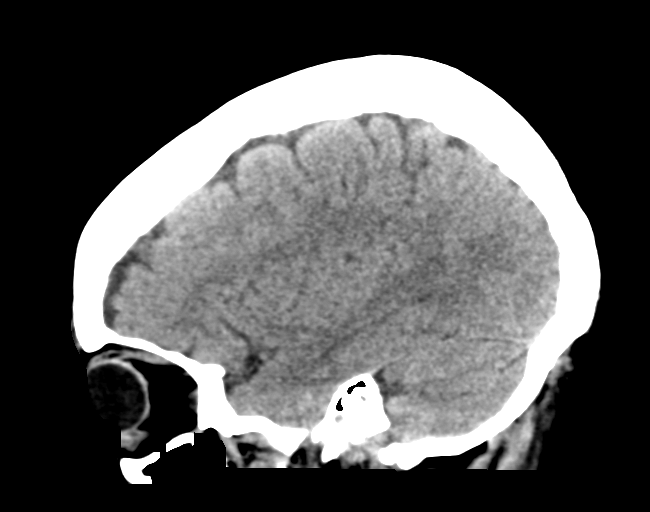
[im 27/54  brain]
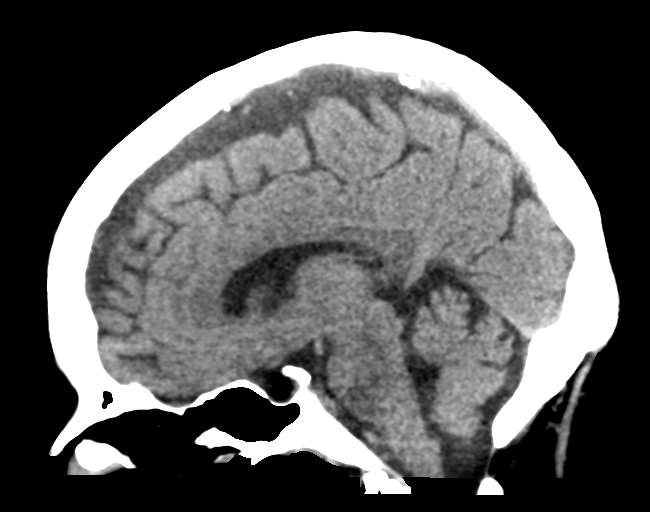
[im 36/54  brain]
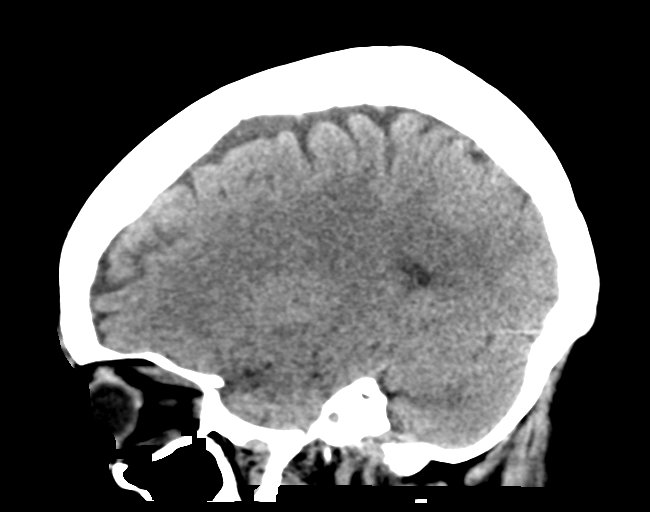

[16 of 47 positions shown; findings below may reference images not displayed]

FINDINGS: Brain: No acute intracranial hemorrhage, midline shift or mass
effect. No extra-axial fluid collection. Gray-white matter
differentiation is within normal limits. No hydrocephalus.

Vascular: No hyperdense vessel or unexpected calcification.

Skull: Normal. Negative for fracture or focal lesion.

Sinuses/Orbits: No acute finding.

Other: None.
IMPRESSION: No acute intracranial process.

## 2023-11-27 NOTE — Progress Notes (Signed)
 Complete physical exam  Patient: Carla Sloan   DOB: May 03, 1960   63 y.o. Female  MRN: [id number]  Subjective:    Chief Complaint  Patient presents with   Medical Management of Chronic Issues    Follow up   She is here for a complete physical exam.  Other providers:  OB/GYN UTD  Cardiologist- Jackee Alberts, NP [facility]  Dietician- Alexis Scotece next week  Pain - Dr. Bonner  Eyes- Dr. Octavia  Psychiatrist - Merrily  Surgeon for gastric bypass  Discussed the use of AI scribe software for clinical note transcription with the patient, who gave verbal consent to proceed.  History of Present Illness Carla Sloan is a 63 year old female who presents for her annual physical exam and to update preventive healthcare.  Preventive healthcare maintenance - Current with OBGYN, colonoscopy, dental, and eye examinations - Upcoming appointment with psychiatrist - Ongoing care with cardiologist - [facility]  Ophthalmologic symptoms - Chronic eye condition with age-related worsening - No surgical intervention planned  Constitutional and systemic symptoms - No current symptoms, including pain or swelling - No urinary or bowel issues - Mood is good  Substance use - No tobacco use - Minimal to no alcohol consumption  Laboratory findings - Last blood work on September 11th showed low blood sugar - Cholesterol checked in March - Liver function is good - Prefers to avoid labs today     Health Maintenance  Topic Date Due   Pneumococcal Vaccine for age over 26 (1 of 2 - PCV) Never done   COVID-19 Vaccine (7 - Pfizer risk 2024-25 season) 05/17/2024   Medicare Annual Wellness Visit  08/13/2024   Breast Cancer Screening  07/22/2025   Pap with HPV screening  09/20/2025   Colon Cancer Screening  05/20/2026   DTaP/Tdap/Td vaccine (2 - Td or Tdap) 08/24/2033   Flu Shot  Completed   Hepatitis C Screening  Completed   HIV  Screening  Completed   Hepatitis B Vaccine  Aged Out   HPV Vaccine  Aged Out   Meningitis B Vaccine  Aged Out   Zoster (Shingles) Vaccine  Discontinued     Depression screening:    08/14/2023    3:33 PM 10/02/2022    8:59 AM 08/05/2022    3:13 PM  Depression screen PHQ 2/9  Decreased Interest 0 0 0  Down, Depressed, Hopeless 0 0 0  PHQ - 2 Score 0 0 0  Altered sleeping 0 0 0  Tired, decreased energy 0 0 0  Change in appetite 0 0 0  Feeling bad or failure about yourself  0 0 0  Trouble concentrating 1 0 0  Moving slowly or fidgety/restless 0 0 0  Suicidal thoughts 0 0 0  PHQ-9 Score 1 0 0  Difficult doing work/chores Not difficult at all     Anxiety Screening:    10/02/2022    8:59 AM 10/10/2021    8:14 AM 10/23/2016    1:27 PM  GAD 7 : Generalized Anxiety Score  Nervous, Anxious, on Edge 0 0 0  Control/stop worrying 0 0 0  Worry too much - different things 0 0 0  Trouble relaxing 0 0 0  Restless 0 0 0  Easily annoyed or irritable 0 0 0  Afraid - awful might happen 0 0 0  Total GAD 7 Score 0 0 0  Anxiety Difficulty  Not difficult at all Not difficult at  all     Patient Care Team: Lendia Boby CROME, NP-C as PCP - General (Family Medicine) Octavia Bruckner, MD as Consulting Physician (Ophthalmology)   Outpatient Medications Prior to Visit  Medication Sig   acetaminophen  (TYLENOL ) 500 MG tablet Take 1 tablet (500 mg total) by mouth every 6 (six) hours as needed.   bimatoprost (LUMIGAN) 0.03 % ophthalmic solution Place 1 drop into both eyes at bedtime.   Blood Pressure Monitoring (BLOOD PRESSURE CUFF) MISC 1 each by Does not apply route daily.   brimonidine -timolol  (COMBIGAN ) 0.2-0.5 % ophthalmic solution Place 1 drop into both eyes every 12 (twelve) hours.   calcium  citrate-vitamin D (CELEBRATE CALCIUM  CITRATE) 500-500 MG-UNIT chewable tablet Chew 1 tablet by mouth every evening.   citalopram  (CELEXA ) 40 MG tablet Take 40 mg by mouth daily.   cyclobenzaprine  (FLEXERIL )  10 MG tablet Take 1 tablet (10 mg total) by mouth 3 (three) times daily.   Lurasidone  HCl (LATUDA ) 60 MG TABS Take 60 mg by mouth at bedtime.   metoprolol  tartrate (LOPRESSOR ) 50 MG tablet Take 1 tablet (50 mg total) by mouth 2 (two) times daily. Can take an additional tablet as needed if heart rate over 100   Multiple Vitamins-Minerals (CELEBRATE MULTI-COMPLETE 45) CHEW Chew 1 each by mouth 3 (three) times daily.   ondansetron  (ZOFRAN ) 4 MG tablet Take 1 tablet (4 mg total) by mouth every 8 (eight) hours as needed for nausea or vomiting.   oxyCODONE -acetaminophen  (PERCOCET) 10-325 MG tablet Take 1 tablet by mouth twice a day as needed.   rosuvastatin  (CRESTOR ) 20 MG tablet Take 1 tablet (20 mg total) by mouth daily.   sacubitril -valsartan  (ENTRESTO ) 49-51 MG Take 1 tablet by mouth 2 (two) times daily.   No facility-administered medications prior to visit.    Review of Systems  Constitutional:  Negative for chills, fever and malaise/fatigue.  HENT:  Negative for congestion, ear pain, sinus pain and sore throat.   Eyes:  Negative for blurred vision, double vision and pain.  Respiratory:  Negative for cough, shortness of breath and wheezing.   Cardiovascular:  Negative for chest pain, palpitations and leg swelling.  Gastrointestinal:  Negative for abdominal pain, constipation, diarrhea, nausea and vomiting.  Genitourinary:  Negative for dysuria, frequency and urgency.  Musculoskeletal:  Negative for back pain, joint pain and myalgias.  Skin:  Negative for rash.  Neurological:  Negative for dizziness, tingling, focal weakness and headaches.  Psychiatric/Behavioral:  Negative for depression. The patient is not nervous/anxious.        Objective:    BP 136/80 (BP Location: Left Arm, Patient Position: Sitting)   Pulse 71   Temp (!) 97.5 F (36.4 C) (Temporal)   Ht 5' 5 (1.651 m)   Wt 289 lb 9.6 oz (131.4 kg)   LMP 09/25/2015 (Approximate)   SpO2 96%   BMI 48.19 kg/m  BP Readings from  Last 3 Encounters:  11/27/23 136/80  11/24/23 (!) 145/76  10/19/23 (!) 145/86   Wt Readings from Last 3 Encounters:  11/27/23 289 lb 9.6 oz (131.4 kg)  10/19/23 (!) 301 lb 6.4 oz (136.7 kg)  09/01/23 290 lb 3.2 oz (131.6 kg)    Physical Exam Constitutional:      General: She is not in acute distress.    Appearance: She is obese. She is not ill-appearing.  HENT:     Right Ear: Tympanic membrane, ear canal and external ear normal.     Left Ear: Tympanic membrane, ear canal and external ear  normal.     Nose: Nose normal.     Mouth/Throat:     Mouth: Mucous membranes are moist.     Pharynx: Oropharynx is clear.  Eyes:     Extraocular Movements: Extraocular movements intact.     Conjunctiva/sclera: Conjunctivae normal.     Pupils: Pupils are equal, round, and reactive to light.  Neck:     Thyroid : No thyroid  mass, thyromegaly or thyroid  tenderness.  Cardiovascular:     Rate and Rhythm: Normal rate and regular rhythm.     Pulses: Normal pulses.     Heart sounds: Normal heart sounds.  Pulmonary:     Effort: Pulmonary effort is normal.     Breath sounds: Normal breath sounds.  Abdominal:     General: Bowel sounds are normal.     Palpations: Abdomen is soft.     Tenderness: There is no abdominal tenderness. There is no right CVA tenderness, left CVA tenderness, guarding or rebound.  Musculoskeletal:        General: Normal range of motion.     Cervical back: Normal range of motion and neck supple. No tenderness.     Right lower leg: No edema.     Left lower leg: No edema.  Lymphadenopathy:     Cervical: No cervical adenopathy.  Skin:    General: Skin is warm and dry.     Findings: No lesion or rash.  Neurological:     General: No focal deficit present.     Mental Status: She is alert and oriented to person, place, and time.     Cranial Nerves: No cranial nerve deficit.     Sensory: No sensory deficit.     Motor: No weakness.     Gait: Gait normal.  Psychiatric:         Mood and Affect: Mood normal.        Behavior: Behavior normal.        Thought Content: Thought content normal.      No results found for any visits on 11/27/23.    Assessment & Plan:    Routine Health Maintenance and Physical Exam Problem List Items Addressed This Visit     Morbid obesity (HCC)   Other Visit Diagnoses       Encounter for general adult medical examination with abnormal findings    -  Primary       Assessment and Plan Assessment & Plan Adult Wellness Visit Annual physical exam completed. Preventive healthcare measures are current, including OBGYN, colonoscopy, dental, and eye exams. Vaccinations are up to date, with [facility]. Medicare Wellness is current until after June 20th next year. No concerns reported. - Document completion of annual physical exam. - Ensure preventive healthcare measures remain up to date. - Advise to consider pneumonia vaccine at a later date. - Declines labs today   Eye condition (unspecified) Reports worsening of an unspecified eye condition with age. No surgical intervention planned or desired. Sees Dr. Octavia   Morbid obesity -Continue working on diet and exercise      Return in about 6 months (around 05/27/2024) for Fasting follow up.     Boby Mackintosh, NP-C

## 2023-12-01 DIAGNOSIS — M25561 Pain in right knee: Secondary | ICD-10-CM | POA: Diagnosis not present

## 2023-12-01 DIAGNOSIS — F333 Major depressive disorder, recurrent, severe with psychotic symptoms: Secondary | ICD-10-CM | POA: Diagnosis not present

## 2023-12-01 DIAGNOSIS — G894 Chronic pain syndrome: Secondary | ICD-10-CM | POA: Diagnosis not present

## 2023-12-01 DIAGNOSIS — M25562 Pain in left knee: Secondary | ICD-10-CM | POA: Diagnosis not present

## 2023-12-08 ENCOUNTER — Ambulatory Visit: Payer: Self-pay | Admitting: Emergency Medicine

## 2023-12-08 ENCOUNTER — Ambulatory Visit (INDEPENDENT_AMBULATORY_CARE_PROVIDER_SITE_OTHER): Admitting: Emergency Medicine

## 2023-12-08 ENCOUNTER — Ambulatory Visit: Payer: Self-pay

## 2023-12-08 ENCOUNTER — Ambulatory Visit

## 2023-12-08 VITALS — BP 148/96 | HR 81 | Temp 98.7°F | Ht 65.0 in | Wt 282.0 lb

## 2023-12-08 DIAGNOSIS — F4310 Post-traumatic stress disorder, unspecified: Secondary | ICD-10-CM | POA: Diagnosis not present

## 2023-12-08 DIAGNOSIS — M25552 Pain in left hip: Secondary | ICD-10-CM | POA: Insufficient documentation

## 2023-12-08 DIAGNOSIS — M5440 Lumbago with sciatica, unspecified side: Secondary | ICD-10-CM | POA: Diagnosis not present

## 2023-12-08 DIAGNOSIS — H547 Unspecified visual loss: Secondary | ICD-10-CM | POA: Diagnosis not present

## 2023-12-08 DIAGNOSIS — G4733 Obstructive sleep apnea (adult) (pediatric): Secondary | ICD-10-CM | POA: Diagnosis not present

## 2023-12-08 DIAGNOSIS — F419 Anxiety disorder, unspecified: Secondary | ICD-10-CM | POA: Diagnosis not present

## 2023-12-08 DIAGNOSIS — I509 Heart failure, unspecified: Secondary | ICD-10-CM | POA: Diagnosis not present

## 2023-12-08 DIAGNOSIS — F319 Bipolar disorder, unspecified: Secondary | ICD-10-CM | POA: Diagnosis not present

## 2023-12-08 DIAGNOSIS — Z79621 Long term (current) use of calcineurin inhibitor: Secondary | ICD-10-CM | POA: Diagnosis not present

## 2023-12-08 DIAGNOSIS — E785 Hyperlipidemia, unspecified: Secondary | ICD-10-CM | POA: Diagnosis not present

## 2023-12-08 DIAGNOSIS — Z86718 Personal history of other venous thrombosis and embolism: Secondary | ICD-10-CM | POA: Diagnosis not present

## 2023-12-08 DIAGNOSIS — Z6841 Body Mass Index (BMI) 40.0 and over, adult: Secondary | ICD-10-CM | POA: Diagnosis not present

## 2023-12-08 DIAGNOSIS — H409 Unspecified glaucoma: Secondary | ICD-10-CM | POA: Diagnosis not present

## 2023-12-08 DIAGNOSIS — I130 Hypertensive heart and chronic kidney disease with heart failure and stage 1 through stage 4 chronic kidney disease, or unspecified chronic kidney disease: Secondary | ICD-10-CM | POA: Diagnosis not present

## 2023-12-08 MED ORDER — MELOXICAM 15 MG PO TABS: 15.0000 mg | ORAL_TABLET | Freq: Every day | ORAL | 0 refills | Status: AC

## 2023-12-08 NOTE — Telephone Encounter (Signed)
 FYI Only or Action Required?: FYI only for provider.  Patient was last seen in primary care on 11/27/2023 by Lendia Boby CROME, NP-C.  Called Nurse Triage reporting Hypertension and Hip Pain.  Symptoms began (returned) yesterday.  Interventions attempted: OTC medications: tylenol , Bengay, Prescription medications: oxycontin , flexeril , Rest, hydration, or home remedies, and Ice/heat application.  Symptoms are: gradually worsening.  Triage Disposition: See PCP When Office is Open (Within 3 Days)  Patient/caregiver understands and will follow disposition?: Yes  Copied from CRM [id number]. Topic: Clinical - Red Word Triage >> Dec 08, 2023 10:47 AM [place] A wrote: [facility] that prompted transfer to Nurse Triage: Patient is having pain right side of hip and Blood Pressure is elevated. Reason for Disposition  [1] MODERATE pain (e.g., interferes with normal activities, limping) AND [2] present > 3 days  Answer Assessment - Initial Assessment Questions Believes elevated BP r/t hip pain   1. BLOOD PRESSURE: What is your blood pressure? Did you take at least two measurements 5 minutes apart?     173/93 Typically BP 119/73 ish 2. ONSET: When did you take your blood pressure?     Today  3. HOW: How did you take your blood pressure? (e.g., automatic home BP monitor, visiting nurse)     Wrist cuff at home 4. HISTORY: Do you have a history of high blood pressure?     endorses 5. MEDICINES: Are you taking any medicines for blood pressure? Have you missed any doses recently?     No missed doses 6. OTHER SYMPTOMS: Do you have any symptoms? (e.g., blurred vision, chest pain, difficulty breathing, headache, weakness)     denies  Answer Assessment - Initial Assessment Questions Patient with chronic hip pain. Was prescribed flexeril  for the last few weeks for flare. Went away for a few days and started again yesterday. She is in constant 8/10 pain now. She did go to the gym Monday 10/13  since her hip was feeling better and was very active. That night she states she could hardly move that hip. Tylenol , oxycontin , Bengay creme with minimal benefit. Flexeril  with limited benefit (takes the edge off) Pain seems to be causing her BP to be elevated despite her antihypertensive regimen. Denies CP/SOB.   1. LOCATION and RADIATION: Where is the pain located? Does the pain spread (shoot) anywhere else?     Left hip sharp aching- denies radiation  2. QUALITY: What does the pain feel like?  (e.g., sharp, dull, aching, burning)     Aching pain  3. SEVERITY: How bad is the pain? What does it keep you from doing?   (Scale 1-10; or mild, moderate, severe)     8/10 achying pain  4. ONSET: When did the pain start? Does it come and go, or is it there all the time?     A couple weeks- stopped for a few days and restarted yesterday  5. WORK OR EXERCISE: Has there been any recent work or exercise that involved this part of the body?      Walks at the gym, water  aerobics, stair master- felt fine during exercise.  6. CAUSE: What do you think is causing the hip pain?      Unsure- has arthritis hx 7. AGGRAVATING FACTORS: What makes the hip pain worse? (e.g., walking, climbing stairs, running)     When she is walking  8. OTHER SYMPTOMS: Do you have any other symptoms? (e.g., back pain, pain shooting down leg,  fever, rash)  Increasing her BP  Protocols used: Blood Pressure - High-A-AH, Hip Pain-A-AH

## 2023-12-08 NOTE — Progress Notes (Signed)
 Carla Sloan 63 y.o.   Chief Complaint  Patient presents with   Hip Pain    Patient states her hip started to hurt 2 weeks ago, gotten better then this week has started to be in pain again. Patient has only taking the flexeril  for the pain says it only helps for a bit, also been using Bengay cream.     Hypertension    Patient says her bp was 150s/90s. No head aches, no blurred vision    HISTORY OF PRESENT ILLNESS: This is a 63 y.o. female complaining of left hip pain progressively getting worse over the past 2 weeks Denies injury.  No other associated symptoms. History of hypertension with occasional high readings at home No other complaints or medical concerns today.  Hip Pain   Hypertension Pertinent negatives include no chest pain, headaches, palpitations or shortness of breath.     Prior to Admission medications   Medication Sig Start Date End Date Taking? Authorizing Provider  acetaminophen  (TYLENOL ) 500 MG tablet Take 1 tablet (500 mg total) by mouth every 6 (six) hours as needed. 05/11/20  Yes Covington, Sarah M, PA-C  bimatoprost (LUMIGAN) 0.03 % ophthalmic solution Place 1 drop into both eyes at bedtime.   Yes [provider]  Blood Pressure Monitoring (BLOOD PRESSURE CUFF) MISC 1 each by Does not apply route daily. 07/08/22  Yes Wyn Jackee VEAR Mickey., NP  brimonidine -timolol  (COMBIGAN ) 0.2-0.5 % ophthalmic solution Place 1 drop into both eyes every 12 (twelve) hours.   Yes [provider]  calcium  citrate-vitamin D (CELEBRATE CALCIUM  CITRATE) 500-500 MG-UNIT chewable tablet Chew 1 tablet by mouth every evening.   Yes [provider]  citalopram  (CELEXA ) 40 MG tablet Take 40 mg by mouth daily.   Yes [provider]  cyclobenzaprine  (FLEXERIL ) 10 MG tablet Take 1 tablet (10 mg total) by mouth 3 (three) times daily. 11/23/23  Yes Henson, Vickie L, NP-C  Lurasidone  HCl (LATUDA ) 60 MG TABS Take 60 mg by mouth at bedtime.   Yes [provider]  metoprolol  tartrate (LOPRESSOR ) 50 MG tablet Take 1 tablet (50 mg total) by mouth 2 (two) times daily. Can take an additional tablet as needed if heart rate over 100 07/03/23  Yes Dick, Jackee VEAR Mickey., NP  Multiple Vitamins-Minerals (CELEBRATE MULTI-COMPLETE 45) CHEW Chew 1 each by mouth 3 (three) times daily.   Yes [provider]  ondansetron  (ZOFRAN ) 4 MG tablet Take 1 tablet (4 mg total) by mouth every 8 (eight) hours as needed for nausea or vomiting. 03/07/22  Yes Scheeler, Donnice PARAS, PA-C  oxyCODONE -acetaminophen  (PERCOCET) 10-325 MG tablet Take 1 tablet by mouth twice a day as needed. 08/05/21  Yes   rosuvastatin  (CRESTOR ) 20 MG tablet Take 1 tablet (20 mg total) by mouth daily. 09/28/23  Yes Wyn Jackee VEAR Mickey., NP  sacubitril -valsartan  (ENTRESTO ) 49-51 MG Take 1 tablet by mouth 2 (two) times daily. 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   Pain in right foot 05/11/2020   Morbid obesity (HCC) 04/24/2020   Osteoarthritis of knee 12/10/2017   Osteoarthritis  of left knee 12/10/2017   Pain in right knee 09/09/2017   Pain in left knee 09/09/2017    Past Medical History:  Diagnosis Date   Arthritis    Depression    Hypertension    Muscle spasms of lower extremity    and back   Tachycardia     Past Surgical History:  Procedure Laterality Date   ABDOMINOPLASTY/PANNICULECTOMY WITH LIPOSUCTION N/A 03/19/2022   Procedure: PANNICULECTOMY WITH LIPOSUCTION;  Surgeon: Lowery Estefana RAMAN, DO;  Location: [facility];  Service: Plastics;  Laterality: N/A;   COLONOSCOPY  WITH PROPOFOL  N/A 09/24/2012   Procedure: COLONOSCOPY WITH PROPOFOL ;  Surgeon: Oliva FORBES Boots, MD;  Location: [facility];  Service: Endoscopy;  Laterality: N/A;   COLONOSCOPY WITH PROPOFOL  N/A 05/19/2016   Procedure: COLONOSCOPY WITH PROPOFOL ;  Surgeon: Oliva Boots, MD;  Location: [facility];  Service: Endoscopy;  Laterality: N/A;   LAPAROSCOPIC GASTRIC SLEEVE RESECTION N/A 04/24/2020   Procedure: LAPAROSCOPIC GASTRIC SLEEVE RESECTION;  Surgeon: Signe Mitzie LABOR, MD;  Location: [facility];  Service: General;  Laterality: N/A;   NO PAST SURGERIES     RIGHT/LEFT HEART CATH AND CORONARY ANGIOGRAPHY N/A 07/04/2022   Procedure: RIGHT/LEFT HEART CATH AND CORONARY ANGIOGRAPHY;  Surgeon: Anner Alm ORN, MD;  Location: [facility] INVASIVE CV LAB;  Service: Cardiovascular;  Laterality: N/A;   TUBAL LIGATION     UPPER GI ENDOSCOPY N/A 04/24/2020   Procedure: UPPER GI ENDOSCOPY;  Surgeon: Signe Mitzie LABOR, MD;  Location: [facility];  Service: General;  Laterality: N/A;    Social History   Socioeconomic History   Marital status: Single    Spouse name: Not on file   Number of children: Not on file   Years of education: Not on file   Highest education level: 12th grade  Occupational History   Occupation: Disablied  Tobacco Use   Smoking status: Never   Smokeless tobacco: Never  Vaping Use   Vaping status: Never Used  Substance and Sexual Activity   Alcohol use: No   Drug use: No   Sexual activity: Yes    Partners: Male    Birth control/protection: None, Post-menopausal  Other Topics Concern   Not on file  Social History Narrative   Lives alone/2025   Social Drivers of Health   Financial Resource Strain: Low Risk  (12/08/2023)   Overall Financial Resource Strain (CARDIA)    Difficulty of Paying Living Expenses: Not hard at all  Food Insecurity: No Food Insecurity (12/08/2023)   Hunger Vital Sign    Worried About Running Out of Food in the Last Year: Never true    Ran Out of Food in the Last Year: Never  true  Transportation Needs: No Transportation Needs (12/08/2023)   PRAPARE - Administrator, Civil Service (Medical): No    Lack of Transportation (Non-Medical): No  Physical Activity: Inactive (12/08/2023)   Exercise Vital Sign    Days of Exercise per Week: 0 days    Minutes of Exercise per Session: Not on file  Stress: No Stress Concern Present (12/08/2023)   [employer] of Occupational Health - Occupational Stress Questionnaire    Feeling of Stress: Not at all  Social Connections: Moderately Isolated (12/08/2023)   Social Connection and Isolation Panel    Frequency of Communication with Friends and Family: Once a week    Frequency of Social Gatherings with Friends and Family: Once a week    Attends Religious Services: 1 to 4 times per year  Active Member of Clubs or Organizations: Yes    Attends Banker Meetings: 1 to 4 times per year    Marital Status: Never married  Intimate Partner Violence: Patient Unable To Answer (08/14/2023)   Humiliation, Afraid, Rape, and Kick questionnaire    Fear of Current or Ex-Partner: Patient unable to answer    Emotionally Abused: Patient unable to answer    Physically Abused: Patient unable to answer    Sexually Abused: Patient unable to answer    Family History  Problem Relation Age of Onset   Hypertension Mother    Diabetes Mother    Breast cancer Mother    Diabetes Father    Hypertension Father      Review of Systems  Constitutional: Negative.  Negative for chills and fever.  HENT: Negative.  Negative for congestion and sore throat.   Respiratory: Negative.  Negative for cough and shortness of breath.   Cardiovascular: Negative.  Negative for chest pain and palpitations.  Gastrointestinal:  Negative for abdominal pain, nausea and vomiting.  Genitourinary: Negative.  Negative for dysuria and hematuria.  Musculoskeletal:  Positive for joint pain.  Skin: Negative.  Negative for rash.  Neurological:  Negative.  Negative for dizziness and headaches.  All other systems reviewed and are negative.   Vitals:   12/08/23 1439  BP: (!) 148/96  Pulse: 81  Temp: 98.7 F (37.1 C)  SpO2: 97%    Physical Exam Vitals reviewed.  Constitutional:      Appearance: Normal appearance. She is obese.  HENT:     Head: Normocephalic.  Cardiovascular:     Rate and Rhythm: Normal rate.  Pulmonary:     Effort: Pulmonary effort is normal.  Skin:    General: Skin is warm and dry.     Capillary Refill: Capillary refill takes less than 2 seconds.  Neurological:     General: No focal deficit present.     Mental Status: She is alert and oriented to person, place, and time.  Psychiatric:        Mood and Affect: Mood normal.        Behavior: Behavior normal.   DG HIP UNILAT W OR W/O PELVIS 2-3 VIEWS LEFT Result Date: 12/08/2023 CLINICAL DATA:  Left hip pain EXAM: DG HIP (WITH OR WITHOUT PELVIS) 2-3V LEFT COMPARISON:  07/20/2021 CT reconstructions FINDINGS: Normal alignment without acute osseous finding or fracture. Intact left hip. Bony pelvis and hips are symmetric. No diastasis. SI joints are maintained. Minor lumbosacral degenerative endplate changes noted. IMPRESSION: No acute finding by plain radiography. Minor lumbosacral degenerative change. Electronically Signed   By: CHRISTELLA.  Shick M.D.   On: 12/08/2023 15:29      ASSESSMENT & PLAN: I personally spent a total of 30 minutes minutes in the care of the patient today including preparing to see the patient, getting/reviewing separately obtained history, performing a medically appropriate exam/evaluation, counseling and educating, placing orders, documenting clinical information in the EHR, coordinating care, and pain management, prognosis, review of x-ray images done today, and need for follow-up.  Problem List Items Addressed This Visit       Other   Left hip pain - Primary   Clinically stable.  No red flag signs or symptoms. Differential diagnosis  discussed Recommend x-ray today.  Will review images when available Pain management discussed Recommend trial of meloxicam 50 mg daily for 10 days May need orthopedic referral Advised to contact the office if no better or worse during the next  several days.      Relevant Medications   meloxicam (MOBIC) 15 MG tablet   Other Relevant Orders   DG HIP UNILAT W OR W/O PELVIS 2-3 VIEWS LEFT   Patient Instructions  Hip Pain The hip is the joint between the upper legs and the lower pelvis. The bones, cartilage, tendons, and muscles of your hip joint support your body and allow you to move around. Hip pain can range from a minor ache to severe pain in one or both of your hips. The pain may be felt on the inside of the hip joint near the groin, or on the outside near the buttocks and upper thigh. You may also have swelling or stiffness in your hip area. Follow these instructions at home: Managing pain, stiffness, and swelling     If told, put ice on the painful area. Put ice in a plastic bag. Place a towel between your skin and the bag. Leave the ice on for 20 minutes, 2-3 times a day. If told, apply heat to the affected area as often as told by your health care provider. Use the heat source that your provider recommends, such as a moist heat pack or a heating pad. Place a towel between your skin and the heat source. Leave the heat on for 20-30 minutes. If your skin turns bright red, remove the ice or heat right away to prevent skin damage. The risk of damage is higher if you cannot feel pain, heat, or cold. Activity Do exercises as told by your provider. Avoid activities that cause pain. General instructions  Take over-the-counter and prescription medicines only as told by your provider. Keep a journal of your symptoms. Write down: How often you have hip pain. The location of your pain. What the pain feels like. What makes the pain worse. Sleep with a pillow between your legs on your  most comfortable side. Keep all follow-up visits. Your provider will monitor your pain and activity. Contact a health care provider if: You cannot put weight on your leg. Your pain or swelling gets worse after a week. It gets harder to walk. You have a fever. Get help right away if: You fall. You have a sudden increase in pain and swelling in your hip. Your hip is red or swollen or very tender to touch. This information is not intended to replace advice given to you by your health care provider. Make sure you discuss any questions you have with your health care provider. Document Revised: 10/15/2021 Document Reviewed: 10/15/2021 Elsevier Patient Education  2024 Elsevier Inc.   Emil Schaumann, MD [facility] at [facility]

## 2023-12-08 NOTE — Assessment & Plan Note (Signed)
 Clinically stable.  No red flag signs or symptoms. Differential diagnosis discussed Recommend x-ray today.  Will review images when available Pain management discussed Recommend trial of meloxicam 50 mg daily for 10 days May need orthopedic referral Advised to contact the office if no better or worse during the next several days.

## 2023-12-08 NOTE — Patient Instructions (Signed)
 Hip Pain The hip is the joint between the upper legs and the lower pelvis. The bones, cartilage, tendons, and muscles of your hip joint support your body and allow you to move around. Hip pain can range from a minor ache to severe pain in one or both of your hips. The pain may be felt on the inside of the hip joint near the groin, or on the outside near the buttocks and upper thigh. You may also have swelling or stiffness in your hip area. Follow these instructions at home: Managing pain, stiffness, and swelling     If told, put ice on the painful area. Put ice in a plastic bag. Place a towel between your skin and the bag. Leave the ice on for 20 minutes, 2-3 times a day. If told, apply heat to the affected area as often as told by your health care provider. Use the heat source that your provider recommends, such as a moist heat pack or a heating pad. Place a towel between your skin and the heat source. Leave the heat on for 20-30 minutes. If your skin turns bright red, remove the ice or heat right away to prevent skin damage. The risk of damage is higher if you cannot feel pain, heat, or cold. Activity Do exercises as told by your provider. Avoid activities that cause pain. General instructions  Take over-the-counter and prescription medicines only as told by your provider. Keep a journal of your symptoms. Write down: How often you have hip pain. The location of your pain. What the pain feels like. What makes the pain worse. Sleep with a pillow between your legs on your most comfortable side. Keep all follow-up visits. Your provider will monitor your pain and activity. Contact a health care provider if: You cannot put weight on your leg. Your pain or swelling gets worse after a week. It gets harder to walk. You have a fever. Get help right away if: You fall. You have a sudden increase in pain and swelling in your hip. Your hip is red or swollen or very tender to touch. This  information is not intended to replace advice given to you by your health care provider. Make sure you discuss any questions you have with your health care provider. Document Revised: 10/15/2021 Document Reviewed: 10/15/2021 Elsevier Patient Education  2024 [employer].

## 2023-12-09 ENCOUNTER — Other Ambulatory Visit: Payer: Self-pay | Admitting: Nurse Practitioner

## 2023-12-16 ENCOUNTER — Ambulatory Visit: Payer: Self-pay | Admitting: Family Medicine

## 2023-12-16 DIAGNOSIS — K912 Postsurgical malabsorption, not elsewhere classified: Secondary | ICD-10-CM | POA: Diagnosis not present

## 2023-12-16 DIAGNOSIS — Z1321 Encounter for screening for nutritional disorder: Secondary | ICD-10-CM | POA: Diagnosis not present

## 2023-12-16 DIAGNOSIS — E569 Vitamin deficiency, unspecified: Secondary | ICD-10-CM | POA: Diagnosis not present

## 2023-12-16 DIAGNOSIS — L987 Excessive and redundant skin and subcutaneous tissue: Secondary | ICD-10-CM | POA: Diagnosis not present

## 2023-12-16 NOTE — Telephone Encounter (Signed)
 Seen on 10/14 dr. Purcell sent in Maynard, requesting something else for pain

## 2023-12-16 NOTE — Telephone Encounter (Signed)
 FYI Only or Action Required?: Action required by provider: new medication request.  Patient was last seen in primary care on 12/08/2023 by Purcell Emil Schanz, MD.  Called Nurse Triage reporting Leg Pain.  Symptoms began several weeks ago.  Interventions attempted: Prescription medications: meloxicam (MOBIC) 15 MG tablet.  Symptoms are: unchanged.  Triage Disposition: See PCP When Office is Open (Within 3 Days)  Patient/caregiver understands and will follow disposition?: No, wishes to speak with PCP           Copied from CRM #[id number]. Topic: Clinical - Red Word Triage >> Dec 16, 2023  9:45 AM Suzen RAMAN wrote: Red Word that prompted transfer to Nurse Triage: Pain in left leg, requesting another medication be called into the pharmacy for pain Reason for Disposition  [1] MODERATE pain (e.g., interferes with normal activities, limping) AND [2] present > 3 days  Answer Assessment - Initial Assessment Questions Pt refused scheduling another appointment at this time unless provider requests she come back. Pt was seen on 10/14 in office. Pt is requesting something else be sent in for her pain. Pt call back number is [phone]. Pt pharmacy:  [id number] [address], [place] - [phone] [facility] [address], [place] [address] Phone: [phone]  Fax: [phone]       Left leg pain when walking Onset: 2 weeks When walking 8/10 pain level, no pain when sitting still Denies numbness, swelling Pt was seen last week in office; pt was given meloxicam 15 mg but it is not working  Protocols used: Leg Pain-A-AH

## 2023-12-17 DIAGNOSIS — E569 Vitamin deficiency, unspecified: Secondary | ICD-10-CM | POA: Diagnosis not present

## 2023-12-17 DIAGNOSIS — K912 Postsurgical malabsorption, not elsewhere classified: Secondary | ICD-10-CM | POA: Diagnosis not present

## 2023-12-17 DIAGNOSIS — Z1321 Encounter for screening for nutritional disorder: Secondary | ICD-10-CM | POA: Diagnosis not present

## 2023-12-23 ENCOUNTER — Institutional Professional Consult (permissible substitution): Payer: Self-pay

## 2024-01-03 ENCOUNTER — Other Ambulatory Visit: Payer: Self-pay | Admitting: Nurse Practitioner

## 2024-02-03 ENCOUNTER — Encounter (HOSPITAL_COMMUNITY): Payer: Self-pay

## 2024-02-03 ENCOUNTER — Ambulatory Visit (HOSPITAL_COMMUNITY): Admission: EM | Admit: 2024-02-03 | Discharge: 2024-02-03 | Disposition: A | Discharge status: Home / Self Care

## 2024-02-03 DIAGNOSIS — S161XXA Strain of muscle, fascia and tendon at neck level, initial encounter: Secondary | ICD-10-CM | POA: Diagnosis not present

## 2024-02-03 MED ORDER — DICLOFENAC SODIUM 50 MG PO TBEC: 50.0000 mg | DELAYED_RELEASE_TABLET | Freq: Two times a day (BID) | ORAL | 1 refills | Status: AC
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
## 2024-02-08 ENCOUNTER — Telehealth: Payer: Self-pay | Admitting: Cardiology

## 2024-02-08 NOTE — Telephone Encounter (Signed)
Patient calling the office for samples of medication: ? ? ?1.  What medication and dosage are you requesting samples for? sacubitril-valsartan (ENTRESTO) 49-51 MG ? ?2.  Are you currently out of this medication? yes ?  ?
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
## 2024-02-29 ENCOUNTER — Encounter: Attending: Surgery | Admitting: Skilled Nursing Facility1

## 2024-02-29 ENCOUNTER — Encounter: Payer: Self-pay | Admitting: Skilled Nursing Facility1

## 2024-02-29 NOTE — Progress Notes (Signed)
 Follow-up visit:  Post-Operative sleeve Surgery  Appt time: 7:25  End time: 8:00    Anthropometrics  Surgery date: 04/24/2020 Surgery type: sleeve Start weight at [facility]: 411.3 pounds Weight today: 277.7  Labs: Glucose 61   Body Composition Scale 04/16/2022 08/19/2022 07/22/2023 09/01/2023 03/01/2023  Current Body Weight 267.1 291.7 298 290.2 277.7  Total Body Fat % 47.8 49.6 50.1 49.5 48.8  Visceral Fat 20 23 24 23 21   Fat-Free Mass % 52.1 50.3 49.8 50.4 51.1   Total Body Water  % 40.5 39.6 39.4 39.7 40  Muscle-Mass lbs 30.7 30.9 30.9 30.8 30.5  BMI 44.1 48.2 49.3 48 45.9  Body Fat Displacement              Torso  lbs 79.2 89.9 92.7 89.3 84         Left Leg  lbs 15.8 17.9 18.5 17.8 16.8         Right Leg  lbs 15.8 17.9 18.5 17.8 16.8         Left Arm  lbs 7.9 8.9 9.2 8.9 8.4         Right Arm   lbs 7.9 8.9 9.2 8.9 8.4   Pt states she does take her daughter to work and other duties for the family so life has been a bit hectic lately.   Dietitian gave pt glucometer for testing due to 61 glucose in labs: meter given: Glucocord expression: exp 04/16/2024 lot [id number]: getting a reading of 94 in office   24 hr recall: Breakfast: grapefruit or protein shake or grits + sausage  Snack 11:30: popcorn Lunch 1pm: apple or tuna salad and crackers Snack: walnuts  Dinner: chicken + greens + rice  Fluid intake: water  60-80 fluid ounces, grapefruit juice  Medications: See List Supplementation: bari multi: celebrate chewable and calcium      Using straws: no Drinking while eating: no Having you been chewing well: yes Chewing/swallowing difficulties: no Changes in vision: no Changes to mood/headaches: no Hair loss/Cahnges to skin/Changes to nails: no Any difficulty focusing or concentrating: no Sweating: no Dizziness/Lightheaded: no Palpitations: no  Carbonated beverages: no N/V/D/C/GAS: no Abdominal Pain: no Dumping syndrome: no  Recent physical activity: 5 days a week  walk a mile, stomach crunches, arm lifting, water  aerobics, at home crunches, leg lifts, dumbbells-everything 50 times   Progress Towards Goal(s):  In Progress Teaching method utilized: Patent Attorney & Auditory  Demonstrated degree of understanding via: Teach Back  Readiness Level: Action Barriers to learning/adherence to lifestyle change: none identified   Teaching Method Utilized:  Visual Auditory Hands on  Demonstrated degree of understanding via:  Teach Back   Monitoring/Evaluation:  Dietary intake, exercise, and body weight.

## 2024-03-03 IMAGING — DX DG CHEST 1V
1 series · 1 of 1 positions shown · non-contrast
Comparison: 03/30/2021

CLINICAL DATA: Tachycardia

EXAM:
CHEST  1 VIEW

[chest pa]
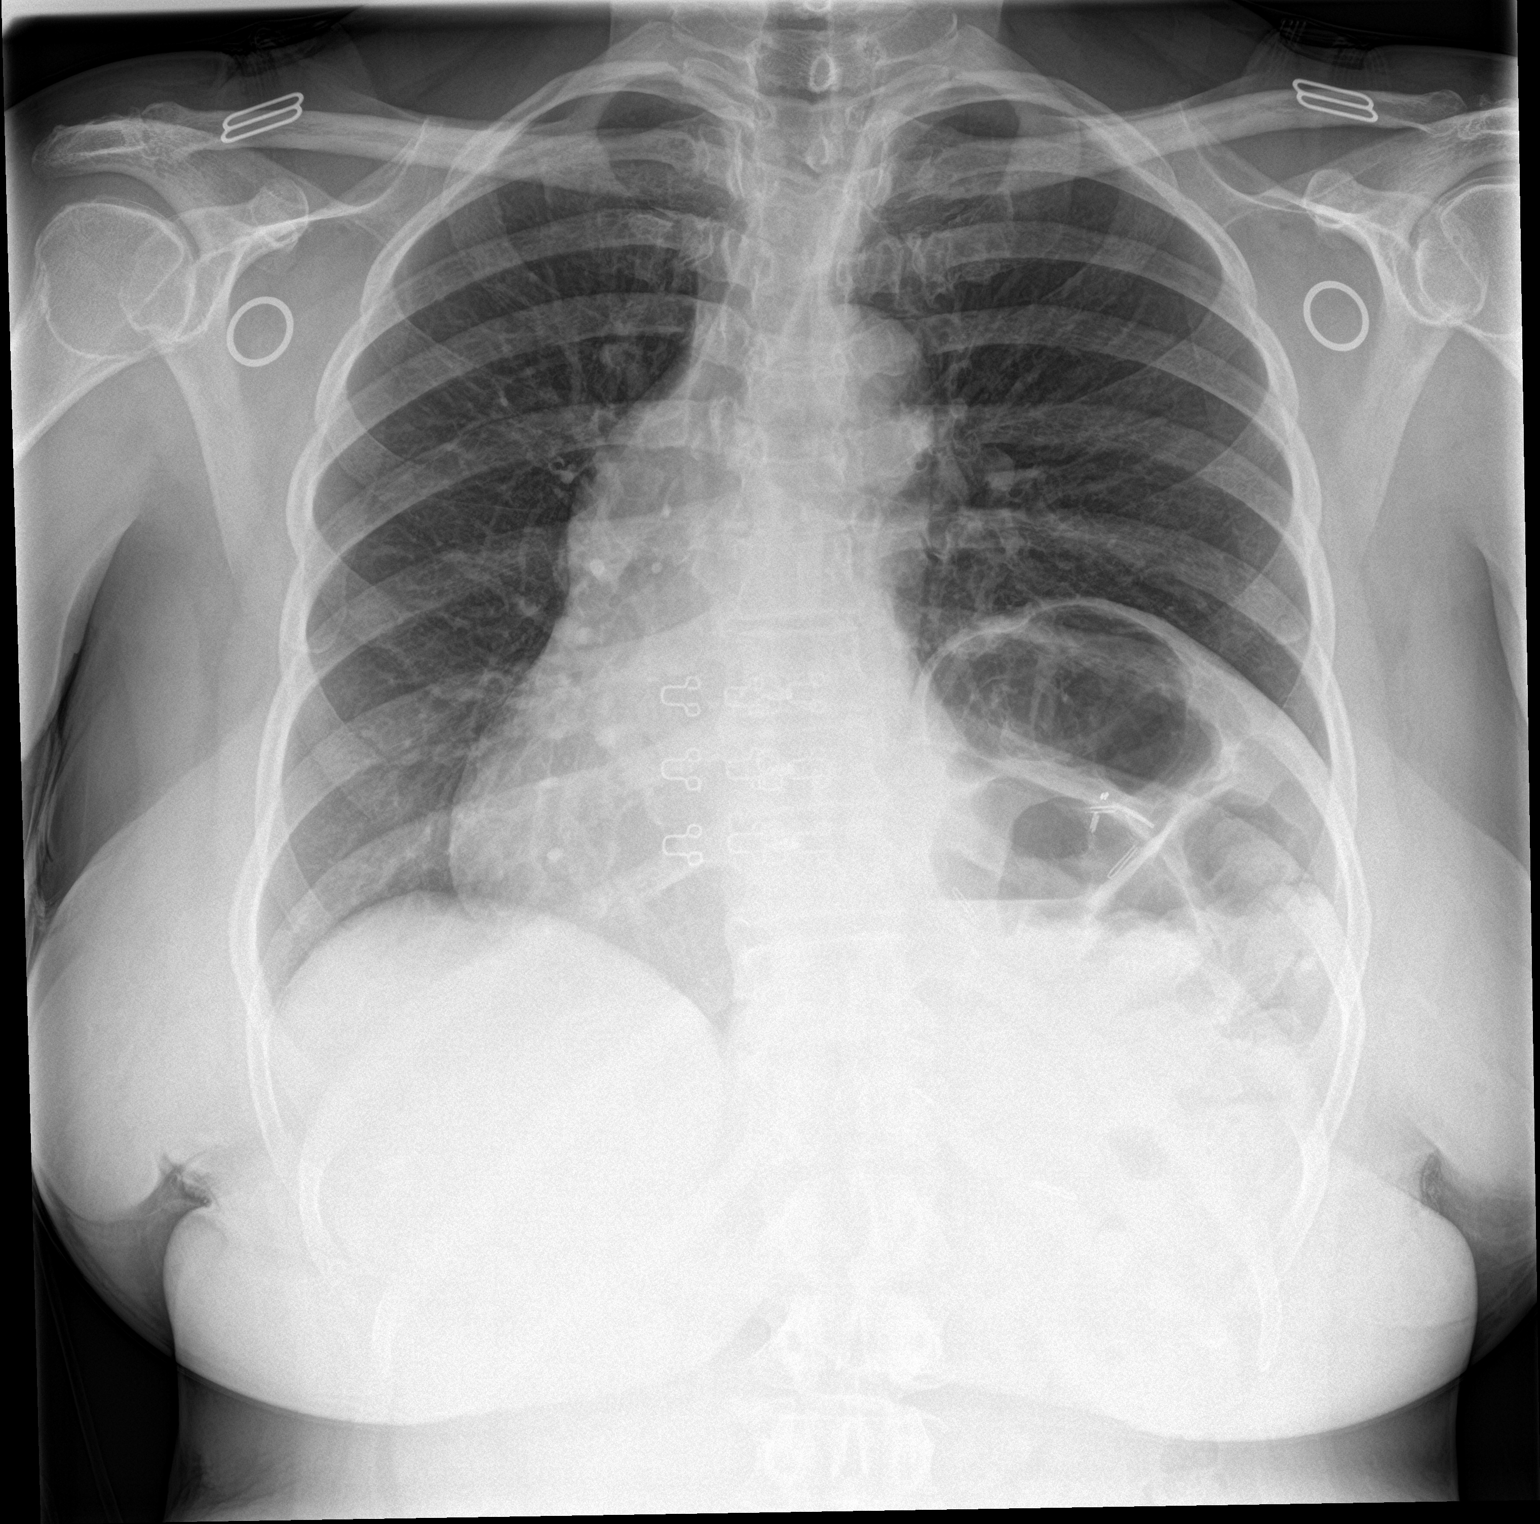

[1 of 1 positions shown; findings below may reference images not displayed]

FINDINGS: Single frontal view of the chest demonstrates a stable cardiac
silhouette. Chronic elevation of the left hemidiaphragm. No airspace
disease, effusion, or pneumothorax. No acute bony abnormality.
IMPRESSION: 1. Stable chest, no acute process.

## 2024-03-09 ENCOUNTER — Other Ambulatory Visit: Payer: Self-pay | Admitting: Family Medicine

## 2024-03-09 DIAGNOSIS — Z1231 Encounter for screening mammogram for malignant neoplasm of breast: Secondary | ICD-10-CM

## 2024-04-04 ENCOUNTER — Encounter: Admitting: Skilled Nursing Facility1

## 2024-05-31 ENCOUNTER — Ambulatory Visit: Admitting: Family Medicine

## 2024-07-25 ENCOUNTER — Ambulatory Visit

## 2024-08-16 ENCOUNTER — Ambulatory Visit
